# Patient Record
Sex: Male | Born: 1945 | Race: White | Hispanic: No | Smoking: Former smoker
Health system: Southern US, Community
[De-identification: ages and names within clinical notes are randomized; demographics above are authoritative.]

## PROBLEM LIST (undated history)

## (undated) DIAGNOSIS — I10 Essential (primary) hypertension: Secondary | ICD-10-CM

## (undated) DIAGNOSIS — Z7901 Long term (current) use of anticoagulants: Secondary | ICD-10-CM

## (undated) DIAGNOSIS — I25810 Atherosclerosis of coronary artery bypass graft(s) without angina pectoris: Secondary | ICD-10-CM

## (undated) DIAGNOSIS — H9190 Unspecified hearing loss, unspecified ear: Secondary | ICD-10-CM

## (undated) DIAGNOSIS — I4891 Unspecified atrial fibrillation: Secondary | ICD-10-CM

## (undated) DIAGNOSIS — C61 Malignant neoplasm of prostate: Secondary | ICD-10-CM

## (undated) DIAGNOSIS — N289 Disorder of kidney and ureter, unspecified: Secondary | ICD-10-CM

## (undated) DIAGNOSIS — R601 Generalized edema: Secondary | ICD-10-CM

## (undated) DIAGNOSIS — Z952 Presence of prosthetic heart valve: Secondary | ICD-10-CM

## (undated) HISTORY — DX: Long term (current) use of anticoagulants: Z79.01

## (undated) HISTORY — DX: Essential (primary) hypertension: I10

## (undated) HISTORY — DX: Atherosclerosis of coronary artery bypass graft(s) without angina pectoris: I25.810

## (undated) HISTORY — PX: CORONARY ARTERY BYPASS GRAFT: SHX141

## (undated) HISTORY — DX: Malignant neoplasm of prostate: C61

## (undated) HISTORY — DX: Presence of prosthetic heart valve: Z95.2

## (undated) HISTORY — DX: Unspecified atrial fibrillation: I48.91

---

## 1999-03-17 HISTORY — PX: MITRAL VALVE REPLACEMENT: SHX147

## 1999-07-23 ENCOUNTER — Inpatient Hospital Stay (HOSPITAL_COMMUNITY): Admission: EM | Admit: 1999-07-23 | Discharge: 1999-08-04 | Payer: Self-pay | Admitting: Emergency Medicine

## 1999-07-23 ENCOUNTER — Encounter (INDEPENDENT_AMBULATORY_CARE_PROVIDER_SITE_OTHER): Payer: Self-pay | Admitting: *Deleted

## 1999-07-29 ENCOUNTER — Encounter: Payer: Self-pay | Admitting: Thoracic Surgery (Cardiothoracic Vascular Surgery)

## 1999-07-30 ENCOUNTER — Encounter: Payer: Self-pay | Admitting: Thoracic Surgery (Cardiothoracic Vascular Surgery)

## 1999-07-31 ENCOUNTER — Encounter: Payer: Self-pay | Admitting: Thoracic Surgery (Cardiothoracic Vascular Surgery)

## 1999-08-01 ENCOUNTER — Encounter: Payer: Self-pay | Admitting: Thoracic Surgery (Cardiothoracic Vascular Surgery)

## 1999-08-18 ENCOUNTER — Inpatient Hospital Stay (HOSPITAL_COMMUNITY): Admission: EM | Admit: 1999-08-18 | Discharge: 1999-08-20 | Payer: Self-pay | Admitting: Emergency Medicine

## 1999-08-18 ENCOUNTER — Encounter: Payer: Self-pay | Admitting: Emergency Medicine

## 1999-08-20 ENCOUNTER — Encounter: Payer: Self-pay | Admitting: Interventional Cardiology

## 2006-11-22 ENCOUNTER — Encounter: Admission: RE | Admit: 2006-11-22 | Discharge: 2006-11-22 | Payer: Self-pay | Admitting: Gastroenterology

## 2007-01-18 ENCOUNTER — Ambulatory Visit (HOSPITAL_COMMUNITY): Admission: RE | Admit: 2007-01-18 | Discharge: 2007-01-18 | Payer: Self-pay | Admitting: Urology

## 2007-02-08 ENCOUNTER — Ambulatory Visit: Admission: RE | Admit: 2007-02-08 | Discharge: 2007-03-16 | Payer: Self-pay | Admitting: Urology

## 2007-03-17 ENCOUNTER — Ambulatory Visit: Admission: RE | Admit: 2007-03-17 | Discharge: 2007-06-15 | Payer: Self-pay | Admitting: Urology

## 2008-06-18 ENCOUNTER — Ambulatory Visit (HOSPITAL_COMMUNITY): Admission: RE | Admit: 2008-06-18 | Discharge: 2008-06-19 | Payer: Self-pay | Admitting: *Deleted

## 2010-06-25 LAB — PROTIME-INR
INR: 3.6 — ABNORMAL HIGH (ref 0.00–1.49)
INR: 4 — ABNORMAL HIGH (ref 0.00–1.49)
Prothrombin Time: 38.7 seconds — ABNORMAL HIGH (ref 11.6–15.2)
Prothrombin Time: 42.3 seconds — ABNORMAL HIGH (ref 11.6–15.2)

## 2010-06-25 LAB — CBC
HCT: 33.8 % — ABNORMAL LOW (ref 39.0–52.0)
Hemoglobin: 11.9 g/dL — ABNORMAL LOW (ref 13.0–17.0)
MCHC: 35 g/dL (ref 30.0–36.0)
MCV: 92.7 fL (ref 78.0–100.0)
Platelets: 244 10*3/uL (ref 150–400)
RBC: 3.65 MIL/uL — ABNORMAL LOW (ref 4.22–5.81)
RDW: 14.8 % (ref 11.5–15.5)
WBC: 4.6 10*3/uL (ref 4.0–10.5)

## 2010-06-25 LAB — BASIC METABOLIC PANEL
BUN: 15 mg/dL (ref 6–23)
CO2: 28 mEq/L (ref 19–32)
Calcium: 8.7 mg/dL (ref 8.4–10.5)
Chloride: 98 mEq/L (ref 96–112)
Creatinine, Ser: 1.25 mg/dL (ref 0.4–1.5)
GFR calc Af Amer: >60 mL/min (ref >60)
GFR calc non Af Amer: 58 mL/min — ABNORMAL LOW (ref >60)
Glucose, Bld: 150 mg/dL — ABNORMAL HIGH (ref 70–99)
Potassium: 4 mEq/L (ref 3.5–5.1)
Sodium: 136 mEq/L (ref 135–145)

## 2010-06-25 LAB — APTT: aPTT: 64 seconds — ABNORMAL HIGH (ref 24–37)

## 2010-07-29 NOTE — Op Note (Signed)
Mario Powell, Mario Powell                 ACCOUNT NO.:  [id number]   MEDICAL RECORD NO.:  [id number]          PATIENT TYPE:  AMB   LOCATION:  SDS                          FACILITY:  [facility]   PHYSICIAN:  Mark C. Lorin Mercy, M.D.    DATE OF BIRTH:  04/10/1945   DATE OF PROCEDURE:  06/18/2008  DATE OF DISCHARGE:                               OPERATIVE REPORT   PREOPERATIVE DIAGNOSIS:  Left distal radius fracture, displaced  angulated.   POSTOPERATIVE DIAGNOSIS:  Left distal radius fracture, displaced  angulated.   PROCEDURE:  Open reduction and internal fixation, left distal radius,  three-part intra-articular fracture.   SURGEON:  Mark C. Lorin Mercy, MD   ANESTHESIA:  General MLA plus 10 mL Marcaine and local.   TOURNIQUET TIME:  43 minutes.   PROCEDURE IN DETAIL:  After induction of general anesthesia and MLA tube  placement, standard prepping and draping with proximal arm tourniquet,  removal of splint, DuraPrep, extremity sheets, drapes, stockinette, and  surgical time-out checklist completed with 2 grams Ancef given.  The  patient had Ancef given prophylactically.  Arm was wrapped with  tourniquet and tourniquet inflated.  Sterile skin marker was used on the  skin and incision was made on the volar wrist.  FCR fascia was split and  then pulling the tendon toward the radial aspect and protect the radial  artery of the posterior FCR wrist sheath was split.  Pronator was peeled  off the radial aspect of the radius exposing the fracture site which was  100% displaced.  Fracture was reduced, standard plate was selected,  DePuy DVR plate.  With fracture reduced, K-wire was placed through the  styloid for temporary stabilization of the plate was applied.  Hole was  drilled in the slotted screw 14-mm length and then plate adjusted  appropriately to good position.  It was difficult to maintain the  reduction.  The reduction had been reperformed a few times and with  traction held, the distal holes  were drilled with distal row going from  ulnar aspect progressing the radial styloid.  A 10 and 20-mm smooth pegs  were used.  A threaded screw in the styloid, then proximal rows were  filled with 18-mm pegs, all locked in securely.  Second 14-mm screw was  placed in the most proximal aspect of the plate.  The wound was  irrigated, checked under fluoroscopy, AP and lateral and photos taken.  There was reduction of the scaphoid and lunate portions of the distal  radius.  Correction of the tilt to the distal radius and correction of  the displacement.  The tourniquet was deflated.  Hemostasis was  obtained.  Radial artery was normal.  Subcutaneous vein had been  coagulated away and was re-coagulated with bipolar cautery.  A 2-0  Vicryl subcutaneous tissue and 4-0 Vicryl subcuticular closure, tincture  of benzoin, Steri-Strips, 4 x 4s, Marcaine infiltration in the skin, and  dorsal splint 10-  thick, after Webril and __________ were applied.  The patient tolerated  the procedure well and was transferred to the recovery room where he  will stay overnight for IV pain medication since he has had difficulty  ambulating and then using crutches prior to his fall.      Mark C. Lorin Mercy, M.D.  Electronically Signed     MCY/MEDQ  D:  06/18/2008  T:  06/19/2008  Job:  [id number]

## 2010-08-01 NOTE — Discharge Summary (Signed)
[place]. [facility]  Patient:    Mario Powell, Mario Powell                          MRN: [id number] Adm. Date:  [id number] Disc. Date: [id number] Attending:  Belva Crome. Iii Dictator:   Julianne Handler, N.P. CC:         Emeline General. Dema Severin, M.D.             Valentina Gu. Roxy Powell, M.D.                           Discharge Summary  PRIMARY CARE Shelaine Frie:  Emeline General. Dema Severin, M.D.  CARDIOVASCULAR THORACIC SURGEON:  Valentina Gu. Roxy Powell, M.D.  PROCEDURES:  Unclotting of PICC line with urokinase on August 20, 1999.  DISCHARGE DIAGNOSES: 1. Hypotension related to atrial fibrillation with rapid ventricular response    exacerbated by fluid volume depletion.  Improvement of admission blood    pressure of approximately 80 systolic to [id number] systolic with conversion of    atrial fibrillation to normal sinus rhythm, control of ventricular rate,    and fluid volume repletion. 2. Atrial fibrillation.  Converted to and maintained normal sinus rhythm with    initiation of Betapace therapy. 3. History of staphylococcal mitral valve endocarditis; status post mitral    valve replacement and coronary artery bypass graft surgery x 1 on Jul 29, 1999 (Mario Powell, M.D.).  He remains on Ancef 2 g IV q.8h. via home    health.  A PICC line is in place, though observed to be clotted this    admission; anticipate declotting of that PICC by radiology/urokinase    protocol before discharge.  He was continued on antibiotics as prior to    admission during this hospital course. 4. Status post mitral valve replacement with St. Jude prosthesis, status post    coronary artery bypass graft surgery x 1 on Jul 29, 1999. 5. Systemic anticoagulation with Coumadin secondary to mitral valve    replacement.  On Coumadin 1 mg per day.  DISPOSITION:  The patient was discharged home in improved condition.  DISCHARGE MEDICATIONS: 1. Betapace 80 mg one p.o. b.i.d. (new). 2. Ancef 2 g per PICC line every eight hours as  prior to admission. 3. Lasix 80 mg one half tablet a day (decreased dosage). 4. K-Dur 20 mEq once daily. 5. Altace 2.5 mg once daily. 6. Enteric-coated aspirin 81 mg once daily. 7. Digoxin 0.125 mg p.o. q.d. (new).  ACTIVITY:  As tolerated.  DIET:  Regular.  SPECIAL INSTRUCTIONS:  Call for fevers.  Resume home health IV antibiotics.  FOLLOW-UP:  Farrel Gordon, M.D., on Wednesday, September 03, 1999, at 11:15 a.m.  The patient will have follow-up pro time/INR via our Coumadin clinic.  HISTORY OF PRESENT ILLNESS:  (Please also see the dictated H&P.)  Mr. Yambao is a 65 year old male who was discharged from [facility] on Aug 04, 1999, after undergoing mitral valve replacement and coronary artery bypass graft surgery related to the development of acute staphylococcal mitral valve endocarditis.  At that hospital admission, he had initially presented with a supraventricular tachycardia, was in congestive heart failure, and subsequently required electrical cardioversion.  At conversion, it was found that he had a loud systolic murmur and fever along with liver enzyme elevations.  Subsequent work-up had identified endocarditis which was successfully treated with  antibiotics and surgery as outlined above.  The patient remained on Ancef 2 g IV q.8h. on home health.  Starting two days prior to admission, he began feeling weak and lightheaded when he would stand. He had no sensation of palpitations.  The home health nurse upon evaluation on the day of admission noted that his heart rate was above 120 and his blood pressure was 80 systolic.  She sent him to the emergency room.  In the ER, he denied shortness of breath and was without chest discomfort, but was feeling weak and somewhat pale.  HOSPITAL COURSE:  His EKG revealed atrial fibrillation with a rapid ventricular response without acute ST-T wave changes.  The BUN was elevated at 34 with a creatinine of 1.4.  The  patient was given IV fluids for suspected fluid volume depletion, as well as withholding of his diuretics and ACE inhibitor.  He was given a 15 mg IV Cardizem bolus with drip, as well as loaded with digoxin for rate control.  He was initiated on Betapace therapy and subsequently converted to NSR after the first dose.  On August 20, 1999, he was stable, though it was noted that PICC line was clotted.  He was scheduled to undergo urokinase protocol for unclotting PICC line by the radiology department and was discharged home after that.  Will also obtain a PA and lateral chest x-ray as routine follow-up post mitral valve replacement surgery.  LABORATORY DATA:  The CBC revealed a WBC of 5.3, a hemoglobin of 9.7, a hematocrit of 28.2, and platelets of 326.  Admission pro time of 20.5 with an INR of 2.4 and a PTT of 37.  Admission sodium 127 (improved to 130 on August 19, 1999), K 4.6, chloride 97, and CO2 22.  The glucose ranged from 124-144. BUN 34, creatinine 1.4.  Subsequent BUN of 27 with a creatinine of 1.2 post IV fluid hydration and withholding of ACE and diuretic.  Calcium 85, total protein 7.4, albumin 2.8.  LFTs overall within normal range, except for increase in ALP of 182.  CK 23 with MB fraction less than 0.3.  Troponin less than 0.03.  The initial EKG revealed atrial fibrillation with rapid ventricular response and without acute ST wave changes. DD:  08/20/99 TD:  08/23/99 Job: [id number]

## 2010-08-01 NOTE — Procedures (Signed)
[place]. [facility]  Patient:    Mario Powell, Mario Powell                          MRN: [id number] Proc. Date: 07/29/99 Adm. Date:  [id number] Disc. Date: [id number] Attending:  Darylene Price CC:         Valentina Gu. Roxy Manns, M.D.                           Procedure Report  PROCEDURE PERFORMED:  Transesophageal echocardiographic evaluation  and monitoring during mitral valve replacement.  ANESTHESIOLOGIST:  Crissie Sickles. Conrad , M.D.  INDICATIONS FOR PROCEDURE:  Mario Powell is a 65 year old gentleman who presented with known mitral regurgitation, history of congestive heart failure with bacterial endocarditis.  He was brought to the operating room and after line placement and uneventful induction of general anesthesia with endotracheal intubation, the transesophageal echocardiogram probe was easily passed into his stomach.  Initial short axis view of the left ventricle showed left ventricular hypertrophy but good wall motion abnormality in all segments. Turning to the four-chamber view, looking specifically at the mitral valve, the posterior leaflet was seen to have a vegetation prominently on the echo and on placing color flow Doppler across the valve, 3 to 4+ mitral regurgitation even in the unloaded state was seen.  The structure of the valve looked reasonably normal aside from the vegetation on the tip of the posterior leaflet.  The majority of the regurgitant jet seemed to track up along the septal wall of the left atrium.  The left atrium was enlarged.  Flow in the pulmonary veins was easily determined and was noted to be reversed. Interatrial septum was normal.  Aortic valve was normal on both views.  The right side of the heart was within normal limits.  The patient was then placed on cardiopulmonary bypass by Dr. Roxy Manns and underwent mitral valve replacement.  Towards the end of the bypass run, initial views of the left atrium showed some moderate amount of air for  which Dr. Roxy Manns performed maneuvers to extract the intracardiac air.  The patient then discontinued from cardiopulmonary bypass with minimal inotropes in good condition.  Initial view of the left ventricle shows unchanged, left ventricular hypertrophy with good wall motion abnormality.  In the region of the mitral valve, the prosthetic valve could be seen to be seated.  The leaflet seemed to be working well and there were no perivalvular leaks other than the normal angle jets ____________ post valve replacement.  The rest of the heart was unchanged from the preop exam.  Flow now in the pulmonary veins was forward.  The patients chest was closed and he was taken to the intensive care unit in stable condition. DD:  07/29/99 TD:  08/01/99 Job: [id number]

## 2010-08-01 NOTE — Op Note (Signed)
[place]. [facility]  Patient:    Mario Powell, Mario Powell                          MRN: [id number] Proc. Date: 07/28/99 Adm. Date:  [id number] Disc. Date: [id number] Attending:  Doyce Para                           Operative Report  REASON FOR STUDY:  Evaluation of endocarditis.  Evaluation of mitral valve preoperatively.  PROCEDURE:  Transesophageal echocardiogram.  DESCRIPTION OF PROCEDURE:  After obtaining written informed consent, the patient was brought to the endoscopy lab in the postabsorptive state.  Topical anesthesia was achieved using Cetacaine spray and viscous lidocaine.  IV sedation was achieved using IV Versed and IV fentanyl  Following appropriate sedation, the Omnipen probe was introduced using digital guidance without difficulty.  Multiple views were obtained at the mid esophageal phase on deep gastric view.  Flow Doppler was performed across the mitral, aortic, tricuspid and pulmonary valves.  Beveled study was performed because of intraatrial septum.  The ascending, descending and aortic arch was then visualized.  The Omnipen probe was then removed; there were no immediate complications.  FINDINGS:  There was severe prolapse of the posterior mitral valve leaflet. A small strand consistent with a vegetation was noted at the leaflet tip. There was severe mitral regurgitation with reversal of flow in the pulmonary vein.  The subvalvular apparatus appeared intact.  There was hypercontractility of the left ventricle, with an ejection fraction of 75%. The left atrium was mildly dilated.  The aortic, tricuspid and pulmonary valves were normal morphology.  There was no vegetation noted on these valves.  There was no tricuspid regurgitation.  The left atrial appendage was normal. Negative bubble study.  No pericardial effusion was noted.  The ascending, descending and aortic arch were within normal limits.  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

## 2010-08-01 NOTE — H&P (Signed)
[place]. [facility]  Patient:    Mario Powell, Mario Powell                          MRN: [id number] Adm. Date:  [id number] Disc. Date: [id number] Attending:  Darylene Price CC:         Valentina Gu. Roxy Manns, M.D.             Emeline General Dema Severin, M.D.                         History and Physical  REASON FOR ADMISSION:  Weakness.  HISTORY OF PRESENT ILLNESS:  The patient is 65 and was discharged from [facility] on Aug 04, 1999, after undergoing mitral valve replacement and coronary artery bypass surgery related to the development of acute staphylococcal mitral valve endocarditis. He initially presented with a supraventricular tachycardia, was in congestive heart failure, and subsequently required electrical conversion. After conversion, it was found that he had a loud systolic murmur and fever along with liver enzyme elevations. The subsequent workup identified endocarditis which was successfully treated with antibiotics and surgery as outlined above. He remains on Ancef 2 g IV q.8h. on home health. Starting two days ago, he began feeling weak and lightheaded when he would stand. He had no sensation of palpitations. The home health nurse came out today and noted that his heart rate was above 120 and that his blood pressure was 80. She sent him to the emergency room. In the emergency room, he denied shortness of breath and is having no chest discomfort but is feeling weak and is somewhat pale.  CURRENT MEDICATIONS: 1. Coumadin 1 mg per day. 2. Ancef 2 g IV q.8h. 3. K-Dur 20 mEq per day. 4. Coated aspirin 81 mg per day. 5. Lasix 80 mg p.o. b.i.d. 6. Altace 2.5 mg per day.  ALLERGIES:  PENICILLIN.  SOCIAL HISTORY/HABITS:  Does not smoke. He denies ethanol consumption.  FAMILY HISTORY:  Father died of myocardial infarction at age 79.  SIGNIFICANT MEDICAL PROBLEMS: 1. Acute staphylococcal bacterial endocarditis Jul 23, 1999, as outlined above. 2. History of gout. 3.  Status post hydrocele repair.  REVIEW OF SYSTEMS:  Unremarkable.  PHYSICAL EXAMINATION:  GENERAL:  The patients color is relatively good.  VITAL SIGNS:  His blood pressure is 88/60, heart rate is 120 to 145 in atrial fibrillation with an irregularly irregular rhythm, his temperature is 98.7.  SKIN:  Reveals no cyanosis. There is mild pallor.  HEENT:  Reveals pupils that are equal and reactive to light.  NECK:  Reveals no JVD, carotid bruits, or thyromegaly.  CHEST:  Reveals a healing sternal incision. No rales are heard. No wheezing is heard.  CARDIAC:  Unremarkable. No murmur or rub is heard. Mechanical valve closure sounds are heard.  ABDOMEN:  Soft. Liver and spleen are not palpable. Bowel sounds are normal.  EXTREMITIES:  Reveal no edema. Pulses are 2+ and symmetric. Right lower extremity reveals a healing midline right lower extremity incision from vein graft harvesting.  NEUROLOGICAL:  Normal.  LABORATORY DATA:  EKG reveals atrial fibrillation with rapid ventricular response and no acute ST-T wave change.  Chest x-ray is pending.  BUN is 34, creatinine 1.4. White blood cell count is 5.3000, hemoglobin 9.7, hematocrit 28.2, platelet count 326,000.  ASSESSMENT: 1. Atrial fibrillation with rapid ventricular response and hypotension likely    related to relative intravascular volume contraction. 2. Status post  mitral valve endocarditis, status post coronary artery bypass    surgery and mitral valve replacement with St. Jude prosthesis on Jul 29, 1999. 3. History of supraventricular tachycardia prior to surgery.  RECOMMENDATION: 1. IV fluids. 2. Cardizem to slow rate. 3. Digoxin. 4. Admit for management of arrhythmia.DD:  08/18/99 TD:  08/18/99 Job: [id number]

## 2010-08-01 NOTE — Consult Note (Signed)
[place]. [facility]  Patient:    Mario Powell, Mario Powell                          MRN: [id number] Proc. Date: 07/23/99 Attending:  Illene Labrador, M.D. CC:         Audree Camel. Hurman Horn., M.D.             Doyce Para, M.D.                          Consultation Report  CONCLUSIONS: 1. Atrial flutter with 2:1 arteriovenous conduction and an effective    ventricular rate of 185 beats per minute.    a. Failure of intravenous Cardizem and IV bolus doses of adenosine to       significantly slow the arrhythmia.    b. Duration of atrial arrhythmia is unknown. 2. Pulmonary congestion of uncertain etiology, probably secondary to prolonged    atrial arrhythmia.  A quick-look echocardiogram in the emergency room also    revealed mitral valve prolapse with mitral regurgitation, although it is    difficult to interpret the significance of this finding at the patients    rapid heart rate.  The ejection fraction appeared to be greater than 50%. 3. Jaundice and fever, etiology uncertain.  It is possible that the jaundice    may be related to hepatic congestion from prolonged atrial arrhythmia, or    there may be some structural or inflammatory hepatic or biliary process.  RECOMMENDATIONS: 1. IV Cardizem. 2. IV procainamide. 3. Limit IV fluids. 4. A 2-D echocardiogram, quick look already done with formal echo to follow    conversion of the rhythm to normal rhythm. 5. Workup jaundice and fever. 6. Digitalization.  COMMENTS:  The patient is 65 years of age and has no history of heart disease. He was admitted with jaundice, tachycardia, hypotension, and fever.  He has had shortness of breath for the past several days.  He has difficulty lying flat.  Perhaps this morning he felt some palpitations.  He has not had syncope.  He has noticed yellow eyes for the past 48 hours.  His urine has been dark.  HABITS:  He does not smoke.  He does drink alcohol, but it is difficult to tell how  heavily.  ALLERGIES:  He denies allergies.  MEDICATIONS:  He is on no medications.  PHYSICAL EXAMINATION:  VITAL SIGNS:  Blood pressure 110/70, heart rate 190 after IV bolus Cardizem, total of 40 mg, and 10 mg per minute drip.  He has also received a total of 18 mg of IV Adenocard.  SKIN:  Dry and jaundiced.  HEENT:  Scleral icterus.  NECK:  Reveals dilated internal jugulars to the level of the jaw with patient lying at 45 degrees.  LUNGS:  Basilar congestion, no wheezing.  CARDIAC:  Systolic murmur that is grade 2/6.  No obvious diastolic murmurs are heard.  No rub is heard.  ABDOMEN:  Soft.  Liver and spleen are not palpable.  EXTREMITIES:  Reveal 2+ edema.  NEUROLOGIC:  Grossly intact.  LABORATORY DATA:  EKG reveals supraventricular tachycardia at a rate of 186j, and on further inspection, atrial flutter with a relatively short flutter cycle length.  No acute ST-T wave changes are noted.  Chest x-ray reveals cardiomegaly with pulmonary congestion.  Quick-look 2-D echocardiogram reveals relatively hyperdynamic LV function with an EF in excess of 50%,  mitral regurgitation, mitral valve prolapse, no obvious vegetation.  Right heart appears normal in size, and there was no pericardial effusion. DD:  07/23/99 TD:  07/23/99 Job: [id number]

## 2010-08-01 NOTE — Procedures (Signed)
[place]. [facility]  Patient:    Mario Powell, Mario Powell                          MRN: [id number] Proc. Date: 07/23/99 Adm. Date:  [id number] Disc. Date: [id number] Attending:  Doyce Para CC:         Doyce Para, M.D.             Robert A. Hurman Horn., M.D.             Finis Bud, M.D.                           Procedure Report  INDICATION:  Atrial flutter with rapid ventricular response.  There has been no response to multiple intravenous medications (IV procainamide, IV Cardizem, IV Adenocard).  PROCEDURE:  Electrical cardioversion.  DESCRIPTION OF PROCEDURE:  After informed consent, the patient was brought to the acute major side of the emergency room where he was given anesthesia by Dr. Finis Bud.  He received 50 mg of IV Propofol.  This induced the desired anesthetic effect.  In a synchronized mode 100 joules was discharged and it converted the patient to normal sinus rhythm/sinus tachycardia.  The patient awakened from the procedure without evidence of neurological sequela. ECG revealed normal sinus rhythm without evidence of acute injury.  CONCLUSIONS:  Successful emergency conversion from atrial flutter to normal sinus rhythm/sinus tachycardia.  PLAN: 1. Discontinue intravenous Cardizem. 2. Continue intravenous procainamide digitalization. 3. Two-dimensional echocardiogram. 4. Decrease intravenous fluids. DD:  07/23/99 TD:  07/25/99 Job: [id number]

## 2010-08-01 NOTE — Cardiovascular Report (Signed)
[place]  Patient:    Mario Powell, Mario Powell                          MRN: [id number] Proc. Date: 07/25/99 Adm. Date:  [id number] Disc. Date: [id number] Attending:  Doyce Para CC:         Valentina Gu. Roxy Manns, M.D.             Court Joy, M.D.             [facility]                        Cardiac Catheterization  CINE NO. 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  PROCEDURES PERFORMED: 1. Left heart catheterization. 2. Right heart catheterization. 3. Selective coronary angiography.  DESCRIPTION OF PROCEDURE:  After informed consent, an 8-French sheath was started in the right femoral vein and a 6-French sheath in the right femoral artery.  A 7-French Swan-Ganz catheter was used for right heart hemodynamic recordings.  The 6-French left #4 Judkins catheters were used for left and right coronary angiography.  The patient tolerated the procedure without complications.  A total of 100 cc of contrast was administered. Ventriculography was not performed.  RESULTS:   I. Hemodynamic data      A. Aortic pressure:  101/78.      B. Left ventricular pressure:  Not recorded.      C. Right atrial mean pressure:  19.      D. Right ventricular pressure:  82/24.      E. Pulmonary artery pressure:  181/42.      F. Pulmonary capillary wedge pressure:  33, V wave 240 mmHg.  II. Selective coronary angiography      A. Left main coronary:  Widely patent.      B. Left anterior descending coronary:  Luminal irregularities with a         large diagonal branch.  The LAD wraps the apex.  No significant         obstruction is noted in the branches or the main body of the LAD.      C. Circumflex artery:  The circumflex gives origin to three obtuse         marginal branches.  The smallest of the obtuse marginal branches,         the second obtuse marginal, contains a 60-70% ostial  stenosis.  The         circumflex is otherwise free of significant obstruction.      D. Right coronary:  This is a dominant vessel giving a PDA and a left         ventricular branch.  There is a 70-80% proximal RCA stenosis.  CONCLUSIONS: 1. Significant coronary disease involving the right coronary and second    obtuse marginal. 2. Pulmonary hypertension, severe.  RECOMMENDATIONS:  Per CVTS.  The patient will need mitral valve replacement. DD:  07/25/99 TD:  07/27/99 Job: [id number]

## 2010-08-01 NOTE — Consult Note (Signed)
[place]. [facility]  Patient:    USMAN, DUNMORE                          MRN: [id number] Proc. Date: 07/24/99 Adm. Date:  [id number] Disc. Date: [id number] Attending:  Doyce Para CC:         Farrel Gordon, M.D.             Doyce Para, M.D.             Robert A. Hurman Horn., M.D.             Emeline General Dema Severin, M.D.             Arelia Longest. Michelle Nasuti., M.D.                          Consultation Report  REFERRING PHYSICIAN:  Farrel Gordon, M.D.  PRIMARY PHYSICIANS: 1. Doyce Para, M.D. [place]. Hurman Horn., M.D. 3. Emeline General. Dema Severin, M.D.  REASON FOR CONSULTATION:  Endocarditis.  HISTORY OF PRESENT ILLNESS:  Mr. 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  He is retired from the [employer].  He has no previous cardiac history and specifically denies any known history of previous heart murmur, episode of congestive heart failure, myocardial infarction, rheumatic fever.  He was in his usual state of good health until four days previously when he developed sudden onset of febrile illness associated with severe malaise and flulike syndrome.  He reports having a fever as high as 104.0 degrees.  He developed soreness in his back, abdomen, arms, and legs.  He became very weak and short of breath.  He developed anorexia and some nausea without any associated vomiting, constipation, or diarrhea.  He reports no abdominal pain, chest pain, or headache.  He has developed arthralgia involving his left hand, wrist, and back.  He reports no cough, hemoptysis, hematochezia, and hematemesis.  He ultimately presented for evaluation to his primary care physician and was subsequently referred to the hospital for admission.  He was noted to be in rapid atrial flutter and cardiology consultation was obtained with Dr. Illene Labrador III.  He was noted to have a  murmur on physical exam suggestive of at least moderate to severe mitral regurgitation.  He was noted to have pulmonary edema as well as jaundice with an admission serum bilirubin of 4.8 despite normal serum transaminase levels and alkaline phosphatase.  Blood cultures were obtained on admission and within 24 hours were positive for bacterial growth consisting of gram-positive cocci in clusters.  Infectious disease and cardiovascular surgical consultations were obtained.  REVIEW OF SYSTEMS:  Mr. Karney reports that prior to last Sunday he has been feeling quite well and in his usual state of health.  He denies any proceeding symptoms of exertional shortness of breath, PND, orthopnea, palpitations, syncope, near syncopal episodes, chest pain, or lower extremity edema.  He reports no recent change in weight, appetite, or bowel function.  He has had no recent other illnesses or medical problems.  He has had a fair amount of dental work done over the last year including a temporary crown placement within the last three to four weeks prior to admission.  He denies any current tender or loose teeth and he reports no areas  that are acutely bothersome in his mouth.  The remainder of his review of systems is unremarkable.  PAST MEDICAL HISTORY:  Notable only for history of gout for which he takes probenecid on a daily basis.  He denies any history of hypertension, hypercholesterolemia, stroke, myocardial infarction, congestive heart failure, rheumatic fever, heart murmur, peripheral vascular disease.  SOCIAL HISTORY:  The patient is retired from [employer] and works full-time as previously discussed.  He lives with his wife and two children here in [place].  His habits include modest alcohol consumption.  He has drunk more significant amounts of alcohol in the past, although he denies any heavy alcohol use in the last several years.  He has a previous history of heavy tobacco use, although he  quit smoking altogether in 1986.  FAMILY HISTORY:  Notable for a strong family history of coronary artery disease.  His father died of an acute myocardial infarction at age 79.  He has several uncles who have died of coronary disease at an early age.  There is no history of valvular heart disease to his knowledge.  MEDICATIONS:  Medications prior to admission include only probenecid 500 mg p.o. b.i.d. for gout.  ALLERGIES:  Mr. Lantis reports a drug allergy to PENICILLIN as he was noted to have some sort of reaction to PENICILLIN at age 50.  He denies any other known drug allergies or sensitivities.  PHYSICAL EXAMINATION:  GENERAL:  A well-developed, well-nourished white male who is mildly ill-appearing, although resting comfortably and breathing comfortably.  VITAL SIGNS:  By two-channel monitor, he has sinus tachycardia with heart rate of 105.  HEENT:  Notable for the absence of any obvious gingival abscess or loose teeth.  NECK:  Supple.  There are no carotid bruits identified.  LYMPH NODES:  There are no cervical or supraclavicular lymphadenopathies.  CHEST:  Auscultation of the chest demonstrates clear lung fields bilaterally with the exception of a few basilar crackles on deep inspiration.  NEUROLOGICAL:  The patient is very weak and sore and has difficulty even just sitting up in bed due to his generalized severe weakness and malaise.  CARDIOVASCULAR:  Somewhat tachycardic heart rate but regular rhythm.  There is a loud grade 4/6 holosystolic murmur heard best near the apex with radiation to the axilla and across the precordium.  No diastolic murmur is appreciated.  ABDOMEN:  The abdomen is soft, nondistended, and nontender.  The liver edge is not palpable.  EXTREMITIES:  Warm and well perfused.  There is trace bipedal edema.  There are skin lesions noted on all four extremities suggestive of infectious endocarditis.   GENITOURINARY:  Deferred.  RECTAL:   Deferred.  LABORATORY DATA:  Laboratory data is notable for admission comprehensive metabolic panel with serum sodium 129, potassium 4.0, chloride 95, bicarb 23, BUN 40, creatinine 1.9, and a glucose of 163.  His serum albumin is low at 3.0 with an AST of 48, ALT 50, alkaline phosphatase 91, and a total bilirubin 4.8. Complete blood count demonstrates white blood count of 6000.  Hemoglobin 13.5, hematocrit 37.5% with a platelet count of 120,000.  Prothrombin time is 14.9 with an INR of 1.3 and a PTT of 35.  Cardiac enzymes are negative.  Serum ammonia level is normal at 31.  Serum amylase and lipase are low normal. Arterial blood gas demonstrates pH 7.46 with a pCO2 of 29, pO2 66, and a bicarb of 21.  Portable chest x-ray demonstrates cardiomegaly with pulmonary vascular congestion.  A 12-lead  electrocardiogram performed Jul 23, 1999 demonstrates supraventricular tachycardia.  Mr. Yeagley subsequently underwent cardioversion by Dr. Illene Labrador III on Jul 23, 1999 and has remained in sinus rhythm since that time.  Transthoracic echocardiogram performed this afternoon on preliminary results demonstrates severe mitral regurgitation with what appears to be a large vegetation emanating from the posterior leaflet of the mitral valve.  There are no obvious signs of aortic valve involvement such as aortic insufficiency. Left ventricular functions appears preserved and the left ventricle is not terribly enlarged.  Formal evaluation and results of this test remain pending at this time.  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  This is complicated by acute renal insufficiency, mild jaundice, mild thrombocytopenia.  I believe that Mr. Molyneaux will likely need surgical intervention for  treatment of his bacterial endocarditis.  PLAN:  I recommend proceeding with left and right heart catheterization as well as transesophageal echocardiogram as soon as these can be practically performed.  Mr. Lahaye additionally needs dental service evaluation and orthopanogram.  I agree with the recommendations for intravenous antibiotics as per Dr. Arelia Longest. Michelle Nasuti.  We will continue to follow closely and tentatively presume the likely need for proceeding with surgical intervention early next week if not sooner. DD:  07/24/99 TD:  07/24/99 Job: [id number]

## 2010-08-01 NOTE — Op Note (Signed)
[place]. [facility]  Patient:    NADER, IPPOLITO                          MRN: [id number] Proc. Date: 07/29/99 Adm. Date:  [id number] Disc. Date: [id number] Attending:  Darylene Price CC:         Illene Labrador, M.D.             Doyce Para, M.D.             Robert A. Hurman Horn., M.D.             Emeline General Dema Severin, M.D.             [facility]                           Operative Report  PREOPERATIVE DIAGNOSIS:  Bacterial endocarditis with severe mitral regurgitation, class 4 congestive heart failure, one vessel coronary artery disease.  POSTOPERATIVE DIAGNOSIS:  Bacterial endocarditis with severe mitral regurgitation, class 4 congestive heart failure, one vessel coronary artery disease.  OPERATION PERFORMED:  Median sternotomy for mitral valve replacement (#31 mm St. Jude mechanical valve) and coronary artery bypass grafting x 1 (saphenous vein graft to distal right coronary artery).  SURGEON:  Valentina Gu. Roxy Manns, M.D.  ASSISTANT:  Shelle Iron, P.A.  ANESTHESIA:  General.  INDICATIONS FOR PROCEDURE:  The patient is a 65 year old white male with no previous cardiac history who developed acute flu-like syndrome and febrile illness and rapidly developed symptoms of class 4 congestive heart failure. He presented to the emergency room and was noted to be in rapid atrial flutter with heart rate in the 180s.  He ultimately underwent DC cardioversion and blood cultures obtained at that time grew out Staphylococcus aureus which was a sensitive species to all penicillin containing products.  Echocardiogram including both transthoracic and transesophageal echocardiogram demonstrated severe 4+ mitral regurgitation with preserved left ventricular function. There is severe prolapse of the posterior leaflet and mitral valve with what appears to be vegatations involving the mitral valve.  There is no obvious sign of any endocarditis involving the aortic valve,  tricuspid valve.  No other specific abnormalities were identified.  Cardiac catheterization demonstrates one vessel coronary artery disease.  Right heart catheterization demonstrates severe pulmonary hypertension with PA pressures in the 80s with preserved antegrade cardiac output.  OPERATIVE CONSENT:  The patient was counseled at length regarding the indications and potential benefits of mitral valve repair or replacement with coronary artery bypass grafting.  He accepts the risks of surgery including but not limited to the risks of death, stroke, myocardial infarction, congestive heart failure, bleeding requiring blood transfusion, arrhythmia, heart block requiring permanent pacemaker, recurrent bacterial endocarditis, prosthetic valve disease.  He understands the likelihood that prosthetic valve replacement will be necessary although a possible consideration of valve repair will be entertained at the time of surgery.  He accepts accepts these risks as well as any unforeseen complications and agrees to proceed with the surgery as described.  DESCRIPTION OF PROCEDURE:  The patient was brought to the operating room on the above-mentioned date and invasive hemodynamic monitoring was established by the anesthesia service under the care and direction of Dr. Lillia Abed. Prior to induction of anesthesia, the patients pulmonary artery pressures were noted to be between 80 and 90 mmHg systolic whereas his arterial blood pressure was between 90 and 100.  General  endotracheal anesthesia was induced uneventfully.  Transesophageal echocardiogram was performed by Dr. Conrad Hayden. This confirmed the findings as discussed previously.  There remains no sign of endocarditis involving the aortic valve or tricuspid valve.  There is no aortic insufficiency.  Left ventricular function appears relatively preserved. There is mild dilatation of the left ventricle and moderate dilatation of the left atrium.  The  patients chest, abdomen, both groins and both lower extremities were prepped and draped in a sterile manner.  A median sternotomy incision was performed.  A portion of the saphenous vein was obtained from the patients right lower leg through a series of longitudinal incisions.  The saphenous vein was notably good quality conduit for bypass grafting.  The patient was heparinized systemically.  The pericardium was opened.  The ascending aorta was inspected and was notably free of any palpable plaques or calcifications.  The ascending aorta was cannulated for cardiopulmonary bypass.  Dual venous cannulation was obtained, placing a right angle metal tipped cannula directly into the superior vena cava and a second cannula placed low in the right atrium down into the inferior vena cava.  A retrograde cardioplegia catheter was placed through the right atrium into the coronary sinus.  Adequate heparinization was verified.  Cardiopulmonary bypass was begun and the surface of the heart was inspected. There was mild left ventricular dilatation and mild to moderate left ventricular hypertrophy.  The distal right coronary artery appears to be normal.  The interatrial groove was dissected.  Umbilical tapes were placed around both the superior and inferior vena cava.  Cardioplegia catheter was placed in the ascending aorta.  A temperature probe was placed in the left ventricular septum.  A styrofoam pad was placed to protect the left phrenic nerve from thermal injury.  The patient was cooled to 28 degrees systemic temperature.  The aortic crossclamp was applied and cardioplegia was delivered initially in antegrade fashion through the aortic root.  Additional doses of cardioplegia were administered retrograde through the coronary sinus catheter as well as antegrade through the subsequently placed vein graft to maintain septal temperature below 15 degrees centigrade throughout the crossclamp portion  of  the operation. Iced saline slush was applied for topical hypothermia.  The following distal coronary anastomosis was performed:  (1) The distal right coronary artery was grafted with a saphenous vein graft in end-to-side fashion using running 7-0 Prolene suture.  This coronary measured 2.0 mm in diameter and was of good quality at the site of distal bypass.  The left atriotomy was performed through the interatrial groove posteriorly. The mitral valve was exposed using a self-retaining retractor.  Towels were placed circumferentially around the entire operative field to maintain sterility.  The existing mitral valve was carefully inspected.  There was evidence of chronic mitral valve prolapse with severe prolapse involving the majority of the entire posterior leaflet of the mitral valve.  There was an area of prolapse involving the anterior leaflet of the mitral valve near the anterolateral commissure. There were several flailed chordae which insert into the head of the posterior medial papillary muscle.  There is evidence of acute bacterial endocarditis with vegetation which appears to have eaten through the head of the posterior medial papillary muscle.  Portions of this vegetation were debrided and a portion was sent for Gram stain, culture and sensitivity. There were additional several small areas of vegetations noted along the edges of both the posterior leaflet and the anterior leaflet of the mitral valve. All obviously infected tissue was  debrided sharply.  After completion of the debridement, the majority of the subvalvular apparatus had been disrupted. The remainder of the mitral valve was excised sharply.  After completion of the debridement, the mitral valve annulus, the left ventricular cavity and left atrium were irrigated with cold saline solution containing vancomycin. The mitral annulus was painted with Betadine solution.  All instruments were used for the debridement  were discarded and the operative field was redraped with sterile towels.  Subsequently, the left ventricle and left atrium were irrigated with copious iced saline solution while maintaining retrograde cardioplegia catheter cardioplegia through the retrograde coronary sinus catheter.  The mitral annulus was sized to a 31 mm St. Jude mechanical valve.  Mechanical valve replacement was performed using interrupted horizontal mattress 2-0 Ethibond pledgeted sutures with pledgets in the superannular position.  A 31 mm St. Jude mechanical valve was secured in place uneventfully.  After all of the sutures had been secured, mitral valve leaflets were inspected for appropriate movement of the mechanical valve.  A 12 French Foley catheter was placed across the valve to maintain its incompetence temporarily and the Foley catheter was fixed to a left ventricular vent to maintain drainage.  Rewarming was begun.  The left atriotomy was closed using running two-layer closure of 3-0 Prolene suture.  Immediately prior to completion of the closure of the left atriotomy, the left ventricle and the left atrium are allowed to fill with blood to remove all air from the system.  The Foley catheter was subsequently removed as the left atriotomy closure was completed.  The single proximal saphenous vein anastomosis was performed to the ascending aorta prior to removal of the aortic crossclamp.  The patient was placed in steep Trendelenburg position.  One last dose of warm retrograde hot shot cardioplegia was administered.  The heart was allowed to fill and the lungs were ventilated to facilitate removal of all air from the pulmonary veins, left atrium, left ventricle, and aortic root.  After completion of deairing maneuvers, the aortic crossclamp was removed and the heart was emptied completely.  The retrograde cardioplegia catheter was removed.  The total crossclamp time for the operation was 140 minutes.  The  left atriotomy closure was inspected for hemostasis.  The saphenous vein graft was inspected for hemostasis at both proximal and distal anastomosis and appropriate graft orientation was verified.  The heart began to beat spontaneously without need for cardioversion.  Epicardial pacing wires were affixed to a right ventricular outflow tract and to the right atrial appendage.  The inferior vena caval cannula was removed after ventilation was begun.  A brief look under transesophageal echocardiogram was performed to make sure that all air had been removed from the left heart circulation.  The patient was weaned from cardiopulmonary bypass without difficulty.  The patients rhythm at separation from bypass is normal sinus rhythm.  The patient was weaned from bypass on low-dose milrinone and dopamine.  Total cardiopulmonary bypass time for the operation was 177 minutes.  Following separation from bypass, follow-up transesophageal echocardiogram was performed by Dr. Conrad Parker.  This demonstrated well preserved left ventricular function. There was a well seated mechanical valve in the mitral position.  There was no sign of mitral regurgitation.  There was no aortic insufficiency.  No other abnormalities were identified.  The superior vena cava cannula was removed uneventfully.  The arterial cannula was removed uneventfully.  Protamine was administered to reverse the anticoagulation.  The mediastinum was irrigated with saline solution containing vancomycin.  Meticulous surgical hemostasis was ascertained.  The mediastinum was drained with two chest tubes placed through separate incisions inferiorly.  The median sternotomy was closed in routine fashion. The right lower extremity incisions were closed in multiple layers in routine fashion.  All skin incisions were closed with subcuticular skin closures.  The patient tolerated the procedure well and was transported to the surgical intensive care unit  in stable condition.  There were no intraoperative complications.  All sponge, needle and instrument counts were verified correct at the completion of the operation.  No autologous blood products were administered. DD:  07/29/99 TD:  07/31/99 Job: [id number]

## 2010-08-01 NOTE — Discharge Summary (Signed)
[place]. [facility]  Patient:    Mario Powell, Mario Powell                          MRN: [id number] Adm. Date:  [id number] Disc. Date: [id number] Attending:  Darylene Price Dictator:   Sharene Butters, P.A.                           Discharge Summary  DATE OF BIRTH:  12-Jan-1946  DISCHARGE DIAGNOSES:  1. 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  2. Coronary artery disease, status post coronary artery bypass grafting.  3. Acute renal insufficiency.  4. Jaundice, Jul 23, 1999, resolved.  5. Volume overload.  6. History of _________ , resolved.  7. History of atrial flutter with rapid ventricular rate, status post DCCV     on Jul 23, 1999.  8. Coumadin therapy secondary to massive vitreous retraction.  9. Decreased appetite, resolved. 10. History of gout.  SURGERIES/PROCEDURES:  1. Status post CABG x 1 of the distal RCA on Jul 29, 1999, Dr. Roxy Manns.  2. 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 Roxy Manns, Jul 29, 1999.  3. Status post TEE for evaluation of arterial endocarditis on mitral valve,     preoperatively on Jul 28, 1999.  4. Status post DCCV on Jul 23, 1999, secondary to atrial flutter with rapid     ventricular rate and no response to IV medications.  OTHER STUDIES:  1. Normal carotid ultrasound on Jul 25, 1999.  2. Blood cultures, showing methicillin sensitive Staph aureus as in blood     culture, mitral valve leaflet, negative.  MEDICATIONS ON DISCHARGE:  1. Altace 2.5 mg one p.o. q.d.  2. K-Dur 20 mEq one p.o. b.i.d.  3. Lasix 80 mg one p.o. b.i.d.  4. Pepcid 20 mg one p.o. q.h.s.  5. Aspirin 81 mg one p.o. q.d.  6. Coumadin as directed by cardiology.  7. Ancef 2 g IV q.8h. per home health nurse.  8. Darvocet-N 100 one or two p.o. q.4-6h. p.r.n. for pain.  ALLERGIES:  PENICILLIN.  FOLLOW-UP:  Follow up with Dr. Tamala Julian in two weeks after discharge.  Follow up with Dr.  Roxy Manns three weeks after discharge.  The office will call.  HISTORY OF PRESENT ILLNESS:  [name] is a 65 year old white male with no previous cardiac history who developed acute flu-like symptoms and febrile illness and rapidly developed symptoms of class 4 congestive heart failure. He presented to the emergency room and he was noted to be in rapid atrial flutter with heart rate in the 180s.  He ultimately underwent DC cardioversion and blood cultures obtained at that time grew out Staph aureus which was a sensitive species to all penicillin containing products.  Echocardiogram including both transthoracic and transesophageal demonstrated severe 4+ mitral regurgitation with preserved left ventricular function.  There was severe prolapse of the posterior leaflet and mitral valve, with what appeared to be vegetations involving the mitral valve.  There were no obvious signs of any endocarditis involving the aortic valve, and tricuspid valve.  No other specific abnormalities were identified.  Cardiac catheterization demonstrated basal coronary artery disease.  Right heart catheterization demonstrated severe pulmonary hypertension with PA pressures in the 80s with preserved antegrade cardiac output.  On Jul 29, 1999 the patient underwent coronary artery bypass  x 1 of the distal RCA, with mitral valve replacement with a #31 St. Jude mechanical valve.  The cultures of valve and vegetation were sent to the lab, _________ stat Grams stain was obtained, which showed gram positive cocci in pairs.  The patient did not require any blood transfusion during the procedure.  Off the operating room, the patient was in normal sinus rhythm, with a low dose of milrinone and dopamine.  Later that afternoon, the patient was extubated, awake, and alert.  He felt remarkably well.  Neuro exam was grossly intact symmetrically.  The rest of his exam was unremarkable.  LABORATORY:  Within normal limits.  On Jul 30, 1999 the patient was overall feeling good, without complaints.  He was afebrile, in normal sinus rhythm, and his blood pressure was stable.  His physical exam was unremarkable.  His creatinine was slightly elevated to 1.9. As well he was volume overloaded.  Diuresis was started, and his renal functions were to be evaluated, and monitored closely.  At the meantime, he was to continue in a renal dose of dopamine.  On Jul 31, 1999, the patient continued to be feeling well, having steady progress.  He was afebrile, and in normal sinus rhythm.  His renal insufficiency was improving.  He was to continue diuresis, and he was transferred to the unit 2000 in stable condition.  Coumadin was started.  He was also continued on his IV Ancef therapy for his bacterial endocarditis, as recommended by infectious disease. On Aug 01, 1999, he continued to be volume overloaded, but otherwise he was showing a remarkable recovery.  B.i.d. Lasix was continued, and since his renal functions improved, ACE inhibitor was added to the medicine regimen.  On Aug 02, 1999, the patients condition continued to improve.  His vital signs were stable, he was afebrile, he continued in normal sinus rhythm.  Because he continued to be volume overloaded, Lasix was increased to 80 mg b.i.d., his potassium was increased as well, and increased mobility was recommended. Later that afternoon, he tolerated the walk, with increased distance.  His O2 saturations were within normal limits.  No complications were observed.  On May 19 at 3:30 p.m., the patient remained afebrile, and valve cultures returned showing mitral valve leaflet negative for bacteria, and the _________  also was showing methicillin sensitive Staph aureus as in the blood culture.  Infectious disease recommended that the patient continue on Ancef 4-6 weeks after hospitalization.  On Aug 03, 1999, the patient continues to be improving, his vital signs are stable, and his  exam is normal, with the exception of this bilateral lower extremity edema, responding slowly to diuretic therapy.  The patient is overall feeling well.  His swelling is  slightly improved.  He remains in normal sinus rhythm, and his INR is within normal limits.  Discharge on Aug 04, 1999 is anticipated.  The first INR will be drawn by home health nurse on Wednesday, May 23 after discharge.  PROCEDURES:  Status post cardiac catheterization on Jul 25, 1999, by Dr. Pernell Dupre.  CONDITION ON DISCHARGE:  Stable. DD:  08/03/99 TD:  08/03/99 Job: [id number]

## 2010-08-01 NOTE — Consult Note (Signed)
[place]. [facility]  Patient:    Mario Powell, Mario Powell                          MRN: [id number] Proc. Date: 07/28/99 Adm. Date:  [id number] Disc. Date: [id number] Attending:  Darylene Price Dictator:   Teena Dunk, D.D.S. CC:         Valentina Gu. Roxy Manns, M.D.                          Consultation Report  DATE OF BIRTH:  08/23/1945  HISTORY OF PRESENT ILLNESS:  Mario Powell is a 65 year old white male referred by Dr. Darylene Price for a dental consultation.  The patient was admitted on Jul 23, 1999, and subsequently diagnosed with subacute bacterial endocarditis with gram positive cocci.  The patient also found to have coronary artery disease and severe mitral regurgitation and is scheduled for a mitral valve replacement/coronary artery bypass graft surgery with Dr. Roxy Manns.  The patient is now seen as part of a pre mitral valve replacement heart surgery dental protocol.  PAST MEDICAL HISTORY: 1. Bacterial endocarditis, gram positive cocci.  Current Ancef and gentamicin    IV therapy. 2. Severe mitral regurgitation with anticipated mitral valve replacement heart    surgery with Dr. Roxy Manns. 3. Coronary artery disease with anticipated coronary artery bypass graft    surgery with a mitral valve replacement. 4. History of atrial flutter.  Status post electrocardioversion on 07/23/99.    Currently on procainamide IV. 5. History of arthralgias. 6. Acute renal failure, improving. 7. History of gout.  ALLERGIES/ADVERSE DRUG REACTIONS:  PENICILLIN.  MEDICATIONS (PER MAR): 1. Ancef 1 g IV q.8h. 2. Gentamicin 40 mg IV q.8h. 3. Altace 1.25 mg b.i.d. 4. Thiamine 100 mg daily. 5. Protonix 40 mg daily. 6. Digoxin 0.25 mg daily. 7. Prednisone 7.5 mg b.i.d. 8. Procainamide 1000 mg IV per protocol.  SOCIAL HISTORY:  The patient is married and lives with his wife and two children.  The patient is with a history of heavy tobacco use in the past, quit smoking in 1986.  The patient  also has a modest alcohol consumption recently with a heavier use in the past.  FAMILY HISTORY:  Significant for coronary artery disease.  Father died at age 33 from myocardial infarction.  Several uncles also have died from coronary artery disease at an early age.  FUNCTIONAL ASSESSMENT:  The patient was independent for activities of daily living prior to this admission.  REVIEW OF SYSTEMS:  (Reviewed from the chart, history and physical, this admission).  DENTAL HISTORY/CHIEF COMPLAINT:  The patient needs a dental consultation prior to anticipated mitral valve replacement heart surgery.  HISTORY OF PRESENT ILLNESS:  The patient was admitted on 07/23/99 and diagnosed with subacute bacterial endocarditis.  The patient has severe mitral regurgitation and coronary artery disease and is scheduled for mitral valve replacement/coronary artery bypass graft surgery with Dr. Roxy Manns.  The patient is now seen as part of a pre mitral valve replacement heart surgery dental protocol.  The patient currently denies toothaches, swellings, or abscess formation.  The patient has been undergoing active dental treatment at [facility] by patient report.  The patient with recent crown preparation and temporization of tooth #18 approximately 3-4 months ago.  The patients last dental cleaning was less than six months ago by patient report.  DENTAL EXAMINATION:  GENERAL:  The patient is a well-developed, well-nourished, white male  laying in hospital bed in no acute distress.  HEENT:  There is no palpable lymphadenopathy.  The patient denies acute TMJ symptoms at this time.  INTRAORAL EXAMINATION:  The patient has no apparent soft tissue pathology which is noted.  The patient has normal saliva.  PERIODONTAL:  The patient has chronic periodontal disease with incipient bone loss.  The patient has good oral hygiene with no evidence of plaque or calculus formation at this time.  There are several localized  areas of gingival recession noted.  DENTITION:  There are multiple missing teeth, numbers 1, 19, 20, and 30. Tooth #16 is impacted.  DENTAL CARIES/SUBOPTIMAL RESTORATIONS:  There are several broken restorations which are noted.  The patient denies current acute symptoms associated with these, and will be seeking dental treatment by his private dentist.  ENDODONTIC:  There is no history of acute pulpitis symptoms.  There is a previous root canal therapy on tooth numbers 18, 25, and 32, with no apparent persistent periapical pathology or symptoms.  CROWN AND BRIDGE:  There are several crown and bridge restorations noted. Tooth #18 has a current temporary crown in place.  PROSTHODONTICS:  There is no history of removable prostheses.  OCCLUSION:  The patient has a poor occlusal scheme secondary to multiple missing teeth, supraeruption, and drifting of the unopposed teeth into the edentulous areas, and lack of replacement of the missing teeth with definitive dental prostheses.  RADIOGRAPHIC INTERPRETATION:  (Panoramic x-ray was taken on 07/25/99). Multiple missing teeth numbers 1, 19, 20, and 30.  There is an impacted tooth #16 present.  There is incipient horizontal vertical bone loss noted.  There are multiple amalgam, resin, and crown and bridge restorations which are noted.  There is supraeruption and drifting of the unopposed teeth into the edentulous areas.  There are multiple root canal therapies on tooth numbers 18, 25, and 32 with no apparent persistent periapical radiolucency.  ASSESSMENT:  1. Chronic periodontal disease with incipient bone loss.  2. Gingival recession.  3. Several suboptimal dental restorations in need of replacement.  4. Temporary crown on tooth #18.  5. Multiple missing teeth.  6. Impacted tooth #16.  7. Supraeruption and drifting of the unopposed teeth into the edentulous     areas.  8. Lack of replacement of the missing teeth with dental  prostheses.  9. Poor occlusal scheme secondary to numbers 5, 7, and 8 as above.  10. History of previous root canal therapy on tooth numbers 18, 25, and 32     with no apparent persistent periapical radiolucency or symptoms.  PLAN/RECOMMENDATIONS: 1. I discussed the risks, benefits, and complications of various treatment    options with the patient in relationship to medical and dental condition    and anticipate a mitral valve replacement/coronary artery bypass graft    heart surgery.  I discussed periodontal therapy, dental restorations, crown    and bridge therapy, and fabrication of definitive dental prostheses to    replace the missing teeth.  The patient refuses treatment at this time and    will follow up with his private dentist for dental care once he is stable    enough to proceed with dental care. 2. Provision of written and verbal information on heart valve surgery and the    need for oral hygiene and dental care. 3. Discussion of the need for antibiotic therapy prior to invasive dental    procedures per AHA guidelines. 4. Discussion of findings with Dr. Roxy Manns and provision of  dental clearance at    this time.  Dr. Roxy Manns is to reconsult for acute dental problems during this    admission. 5. Suggest initiation of chlorhexidine rinse 0.12% rinsing with 15 mL b.i.d.    The patient is to spit out the excess and not swallow the rinse. DD:  07/29/99 TD:  07/29/99 Job: [id number]

## 2011-03-20 ENCOUNTER — Ambulatory Visit (INDEPENDENT_AMBULATORY_CARE_PROVIDER_SITE_OTHER): Payer: Medicare Other

## 2011-03-20 DIAGNOSIS — R05 Cough: Secondary | ICD-10-CM | POA: Diagnosis not present

## 2011-03-20 DIAGNOSIS — D649 Anemia, unspecified: Secondary | ICD-10-CM

## 2011-03-20 DIAGNOSIS — R059 Cough, unspecified: Secondary | ICD-10-CM

## 2011-05-01 DIAGNOSIS — Z7901 Long term (current) use of anticoagulants: Secondary | ICD-10-CM | POA: Diagnosis not present

## 2011-05-01 DIAGNOSIS — Z79899 Other long term (current) drug therapy: Secondary | ICD-10-CM | POA: Diagnosis not present

## 2011-05-01 DIAGNOSIS — Z954 Presence of other heart-valve replacement: Secondary | ICD-10-CM | POA: Diagnosis not present

## 2011-05-01 DIAGNOSIS — E785 Hyperlipidemia, unspecified: Secondary | ICD-10-CM | POA: Diagnosis not present

## 2011-05-01 DIAGNOSIS — I4891 Unspecified atrial fibrillation: Secondary | ICD-10-CM | POA: Diagnosis not present

## 2011-05-01 DIAGNOSIS — I509 Heart failure, unspecified: Secondary | ICD-10-CM | POA: Diagnosis not present

## 2011-05-01 DIAGNOSIS — I2510 Atherosclerotic heart disease of native coronary artery without angina pectoris: Secondary | ICD-10-CM | POA: Diagnosis not present

## 2011-10-10 ENCOUNTER — Telehealth: Payer: Self-pay

## 2011-10-10 NOTE — Telephone Encounter (Signed)
PT HAS RECEIVED A BILL FOR HIS SERVICES IN EARLY January.  PLEASE REFILE ALL THREE INSURANCES  ACCOUNT NUMBER [id number]

## 2011-10-12 NOTE — Telephone Encounter (Signed)
Janelle to refile per pt request.

## 2011-10-28 ENCOUNTER — Ambulatory Visit (INDEPENDENT_AMBULATORY_CARE_PROVIDER_SITE_OTHER): Payer: Self-pay | Admitting: General Surgery

## 2011-10-29 DIAGNOSIS — I059 Rheumatic mitral valve disease, unspecified: Secondary | ICD-10-CM | POA: Diagnosis not present

## 2011-11-05 DIAGNOSIS — I059 Rheumatic mitral valve disease, unspecified: Secondary | ICD-10-CM | POA: Diagnosis not present

## 2011-11-23 DIAGNOSIS — I059 Rheumatic mitral valve disease, unspecified: Secondary | ICD-10-CM | POA: Diagnosis not present

## 2011-11-23 DIAGNOSIS — E785 Hyperlipidemia, unspecified: Secondary | ICD-10-CM | POA: Diagnosis not present

## 2012-01-11 DIAGNOSIS — C61 Malignant neoplasm of prostate: Secondary | ICD-10-CM | POA: Diagnosis not present

## 2012-01-18 DIAGNOSIS — C61 Malignant neoplasm of prostate: Secondary | ICD-10-CM | POA: Diagnosis not present

## 2012-02-22 DIAGNOSIS — Z23 Encounter for immunization: Secondary | ICD-10-CM | POA: Diagnosis not present

## 2012-05-24 DIAGNOSIS — I059 Rheumatic mitral valve disease, unspecified: Secondary | ICD-10-CM | POA: Diagnosis not present

## 2012-05-24 DIAGNOSIS — E7800 Pure hypercholesterolemia, unspecified: Secondary | ICD-10-CM | POA: Diagnosis not present

## 2012-05-31 DIAGNOSIS — I10 Essential (primary) hypertension: Secondary | ICD-10-CM | POA: Diagnosis not present

## 2012-05-31 DIAGNOSIS — Z7901 Long term (current) use of anticoagulants: Secondary | ICD-10-CM | POA: Diagnosis not present

## 2012-05-31 DIAGNOSIS — Z954 Presence of other heart-valve replacement: Secondary | ICD-10-CM | POA: Diagnosis not present

## 2012-05-31 DIAGNOSIS — E785 Hyperlipidemia, unspecified: Secondary | ICD-10-CM | POA: Diagnosis not present

## 2012-05-31 DIAGNOSIS — I2510 Atherosclerotic heart disease of native coronary artery without angina pectoris: Secondary | ICD-10-CM | POA: Diagnosis not present

## 2012-05-31 DIAGNOSIS — I4891 Unspecified atrial fibrillation: Secondary | ICD-10-CM | POA: Diagnosis not present

## 2012-05-31 DIAGNOSIS — Z79899 Other long term (current) drug therapy: Secondary | ICD-10-CM | POA: Diagnosis not present

## 2012-06-21 DIAGNOSIS — Z954 Presence of other heart-valve replacement: Secondary | ICD-10-CM | POA: Diagnosis not present

## 2012-06-21 DIAGNOSIS — Z7901 Long term (current) use of anticoagulants: Secondary | ICD-10-CM | POA: Diagnosis not present

## 2012-07-19 DIAGNOSIS — Z954 Presence of other heart-valve replacement: Secondary | ICD-10-CM | POA: Diagnosis not present

## 2012-07-19 DIAGNOSIS — Z7901 Long term (current) use of anticoagulants: Secondary | ICD-10-CM | POA: Diagnosis not present

## 2012-08-31 DIAGNOSIS — Z954 Presence of other heart-valve replacement: Secondary | ICD-10-CM | POA: Diagnosis not present

## 2012-08-31 DIAGNOSIS — Z7901 Long term (current) use of anticoagulants: Secondary | ICD-10-CM | POA: Diagnosis not present

## 2012-09-29 DIAGNOSIS — Z7901 Long term (current) use of anticoagulants: Secondary | ICD-10-CM | POA: Diagnosis not present

## 2012-09-29 DIAGNOSIS — Z954 Presence of other heart-valve replacement: Secondary | ICD-10-CM | POA: Diagnosis not present

## 2012-11-02 DIAGNOSIS — Z7901 Long term (current) use of anticoagulants: Secondary | ICD-10-CM | POA: Diagnosis not present

## 2012-11-02 DIAGNOSIS — Z954 Presence of other heart-valve replacement: Secondary | ICD-10-CM | POA: Diagnosis not present

## 2012-11-30 DIAGNOSIS — Z954 Presence of other heart-valve replacement: Secondary | ICD-10-CM | POA: Diagnosis not present

## 2012-11-30 DIAGNOSIS — Z7901 Long term (current) use of anticoagulants: Secondary | ICD-10-CM | POA: Diagnosis not present

## 2013-01-11 ENCOUNTER — Other Ambulatory Visit: Payer: Self-pay

## 2013-01-11 ENCOUNTER — Telehealth: Payer: Self-pay

## 2013-01-11 ENCOUNTER — Ambulatory Visit (INDEPENDENT_AMBULATORY_CARE_PROVIDER_SITE_OTHER): Payer: Medicare Other | Admitting: Pharmacist

## 2013-01-11 ENCOUNTER — Other Ambulatory Visit (INDEPENDENT_AMBULATORY_CARE_PROVIDER_SITE_OTHER): Payer: Medicare Other

## 2013-01-11 DIAGNOSIS — E785 Hyperlipidemia, unspecified: Secondary | ICD-10-CM

## 2013-01-11 DIAGNOSIS — Z954 Presence of other heart-valve replacement: Secondary | ICD-10-CM | POA: Diagnosis not present

## 2013-01-11 DIAGNOSIS — I059 Rheumatic mitral valve disease, unspecified: Secondary | ICD-10-CM | POA: Insufficient documentation

## 2013-01-11 DIAGNOSIS — Z952 Presence of prosthetic heart valve: Secondary | ICD-10-CM

## 2013-01-11 HISTORY — DX: Presence of prosthetic heart valve: Z95.2

## 2013-01-11 LAB — ALT: ALT: 40 U/L (ref 0–53)

## 2013-01-11 LAB — LIPID PANEL
LDL Cholesterol: 73 mg/dL (ref 0–99)
Total CHOL/HDL Ratio: 3

## 2013-01-11 NOTE — Telephone Encounter (Signed)
[email or web address] ok repeat in 1 year

## 2013-01-11 NOTE — Telephone Encounter (Signed)
Message copied by Lamar Laundry on Wed Jan 11, 2013  5:25 PM ------      Message from: Daneen Schick      Created: Wed Jan 11, 2013  5:18 PM       Lipid and liver panel are at target. Repeat in 1 year. ------

## 2013-01-25 DIAGNOSIS — C61 Malignant neoplasm of prostate: Secondary | ICD-10-CM | POA: Diagnosis not present

## 2013-02-03 ENCOUNTER — Other Ambulatory Visit: Payer: Self-pay | Admitting: Interventional Cardiology

## 2013-02-22 ENCOUNTER — Ambulatory Visit (INDEPENDENT_AMBULATORY_CARE_PROVIDER_SITE_OTHER): Payer: Medicare Other | Admitting: Pharmacist

## 2013-02-22 DIAGNOSIS — Z954 Presence of other heart-valve replacement: Secondary | ICD-10-CM

## 2013-02-22 DIAGNOSIS — I059 Rheumatic mitral valve disease, unspecified: Secondary | ICD-10-CM | POA: Diagnosis not present

## 2013-02-22 LAB — POCT INR: INR: 3.8

## 2013-03-02 DIAGNOSIS — Z23 Encounter for immunization: Secondary | ICD-10-CM | POA: Diagnosis not present

## 2013-03-23 ENCOUNTER — Ambulatory Visit (INDEPENDENT_AMBULATORY_CARE_PROVIDER_SITE_OTHER): Payer: Medicare Other

## 2013-03-23 DIAGNOSIS — I059 Rheumatic mitral valve disease, unspecified: Secondary | ICD-10-CM

## 2013-03-23 DIAGNOSIS — Z954 Presence of other heart-valve replacement: Secondary | ICD-10-CM | POA: Diagnosis not present

## 2013-03-23 LAB — POCT INR: INR: 3.1

## 2013-04-13 ENCOUNTER — Other Ambulatory Visit: Payer: Self-pay | Admitting: Interventional Cardiology

## 2013-04-18 ENCOUNTER — Other Ambulatory Visit: Payer: Self-pay | Admitting: Interventional Cardiology

## 2013-04-27 ENCOUNTER — Ambulatory Visit (INDEPENDENT_AMBULATORY_CARE_PROVIDER_SITE_OTHER): Payer: Medicare Other | Admitting: *Deleted

## 2013-04-27 DIAGNOSIS — Z954 Presence of other heart-valve replacement: Secondary | ICD-10-CM | POA: Diagnosis not present

## 2013-04-27 DIAGNOSIS — I059 Rheumatic mitral valve disease, unspecified: Secondary | ICD-10-CM

## 2013-04-27 LAB — POCT INR: INR: 3.8

## 2013-05-25 ENCOUNTER — Ambulatory Visit (INDEPENDENT_AMBULATORY_CARE_PROVIDER_SITE_OTHER): Payer: Medicare Other | Admitting: Pharmacist

## 2013-05-25 DIAGNOSIS — Z954 Presence of other heart-valve replacement: Secondary | ICD-10-CM | POA: Diagnosis not present

## 2013-05-25 DIAGNOSIS — I059 Rheumatic mitral valve disease, unspecified: Secondary | ICD-10-CM | POA: Diagnosis not present

## 2013-05-25 LAB — POCT INR: INR: 2.8

## 2013-05-31 ENCOUNTER — Ambulatory Visit: Payer: Self-pay | Admitting: Interventional Cardiology

## 2013-06-13 ENCOUNTER — Telehealth: Payer: Self-pay

## 2013-06-13 MED ORDER — WARFARIN SODIUM 1 MG PO TABS: ORAL_TABLET | ORAL | Status: DC

## 2013-06-13 NOTE — Telephone Encounter (Signed)
Prescription for warfarin 1 mg pills sent to pharmacy.

## 2013-06-22 ENCOUNTER — Ambulatory Visit (INDEPENDENT_AMBULATORY_CARE_PROVIDER_SITE_OTHER): Payer: Medicare Other

## 2013-06-22 DIAGNOSIS — I059 Rheumatic mitral valve disease, unspecified: Secondary | ICD-10-CM

## 2013-06-22 DIAGNOSIS — Z954 Presence of other heart-valve replacement: Secondary | ICD-10-CM

## 2013-06-22 LAB — POCT INR: INR: 2.7

## 2013-06-26 ENCOUNTER — Telehealth: Payer: Self-pay | Admitting: Interventional Cardiology

## 2013-06-26 NOTE — Telephone Encounter (Signed)
Spoke with patient. Pt has appt with Dr Tamala Julian 07/11/13 and will call back if he needs medication refills before that appt.

## 2013-06-26 NOTE — Telephone Encounter (Signed)
New problem     Pt will call back with medications needed.

## 2013-06-27 ENCOUNTER — Ambulatory Visit: Payer: Medicare Other | Admitting: Interventional Cardiology

## 2013-06-30 ENCOUNTER — Other Ambulatory Visit: Payer: Self-pay | Admitting: Interventional Cardiology

## 2013-07-11 ENCOUNTER — Ambulatory Visit: Payer: Medicare Other | Admitting: Interventional Cardiology

## 2013-07-12 ENCOUNTER — Ambulatory Visit (INDEPENDENT_AMBULATORY_CARE_PROVIDER_SITE_OTHER): Payer: Medicare Other | Admitting: Interventional Cardiology

## 2013-07-12 ENCOUNTER — Encounter: Payer: Self-pay | Admitting: Interventional Cardiology

## 2013-07-12 ENCOUNTER — Encounter (INDEPENDENT_AMBULATORY_CARE_PROVIDER_SITE_OTHER): Payer: Self-pay

## 2013-07-12 VITALS — BP 146/71 | HR 82 | Ht 71.0 in | Wt 202.0 lb

## 2013-07-12 DIAGNOSIS — I10 Essential (primary) hypertension: Secondary | ICD-10-CM

## 2013-07-12 DIAGNOSIS — C61 Malignant neoplasm of prostate: Secondary | ICD-10-CM

## 2013-07-12 DIAGNOSIS — Z954 Presence of other heart-valve replacement: Secondary | ICD-10-CM

## 2013-07-12 DIAGNOSIS — I4891 Unspecified atrial fibrillation: Secondary | ICD-10-CM

## 2013-07-12 DIAGNOSIS — I059 Rheumatic mitral valve disease, unspecified: Secondary | ICD-10-CM | POA: Diagnosis not present

## 2013-07-12 DIAGNOSIS — I25810 Atherosclerosis of coronary artery bypass graft(s) without angina pectoris: Secondary | ICD-10-CM | POA: Diagnosis not present

## 2013-07-12 DIAGNOSIS — Z7901 Long term (current) use of anticoagulants: Secondary | ICD-10-CM

## 2013-07-12 HISTORY — DX: Malignant neoplasm of prostate: C61

## 2013-07-12 HISTORY — DX: Long term (current) use of anticoagulants: Z79.01

## 2013-07-12 HISTORY — DX: Essential (primary) hypertension: I10

## 2013-07-12 HISTORY — DX: Atherosclerosis of coronary artery bypass graft(s) without angina pectoris: I25.810

## 2013-07-12 HISTORY — DX: Unspecified atrial fibrillation: I48.91

## 2013-07-12 NOTE — Patient Instructions (Signed)
Your physician recommends that you continue on your current medications as directed. Please refer to the Current Medication list given to you today.  Your physician has requested that you have an echocardiogram. Echocardiography is a painless test that uses sound waves to create images of your heart. It provides your doctor with information about the size and shape of your heart and how well your heart's chambers and valves are working. This procedure takes approximately one hour. There are no restrictions for this procedure.   Your physician wants you to follow-up in: 1 year You will receive a reminder letter in the mail two months in advance. If you don't receive a letter, please call our office to schedule the follow-up appointment.

## 2013-07-12 NOTE — Progress Notes (Signed)
Patient ID: Mario Powell, male   DOB: [birth year], 68 y.o.   MRN: [id number]    [address], [address] [place], [place]  [address] Phone: [phone] Fax:  [phone]  Date:  07/12/2013   ID:  Mario Powell, DOB 09/08/1945, MRN [id number]  PCP:  No primary provider on file.   ASSESSMENT:  1. Mechanical mitral valve for mitral regurgitation and 2001. Asymptomatic 2. Postoperative atrial fibrillation, resolved without recurrences over the last 14 years 3. Chronic anticoagulation therapy without bleeding 4. Coronary artery disease with saphenous vein grafting in 2001 at time of mitral valve replacement 5. Hypertension, controlled 5. Hyperlipidemia, on therapy  PLAN:  1. 2-D Doppler echocardiogram to reassess valve function and LV size/function 2. Clinical followup in one year 3. No change in therapy 4. Followup in Coumadin clinic   SUBJECTIVE: Mario Powell is a 68 y.o. male who is doing well. He is now 14 years out from single coronary bypass grafting and mitral valve replacement with mechanical prosthesis. There no cardiopulmonary complaints. He denies orthopnea, PND, palpitations, TIA, bleeding on Coumadin, orthopnea, and syncope.   Wt Readings from Last 3 Encounters:  07/12/13 202 lb (91.627 kg)     History reviewed. No pertinent past medical history.  Current Outpatient Prescriptions  Medication Sig Dispense Refill  . aspirin 81 MG tablet Take 81 mg by mouth daily.      . CRESTOR 40 MG tablet TAKE 1 TABLET ONCE A DAY  90 tablet  1  . hydrochlorothiazide (HYDRODIURIL) 25 MG tablet TAKE 1 TABLET EVERY MORNING  90 tablet  0  . NIASPAN 1000 MG CR tablet TAKE 1 TABLET AT BEDTIME AS DIRECTED  90 tablet  1  . ramipril (ALTACE) 10 MG capsule TAKE 1 CAPSULE TWICE A DAY  180 capsule  1  . warfarin (COUMADIN) 1 MG tablet Take 1 tablet on all days except 2 tablets on Tuesday, Thursday, and Saturday, or as directed by Coumadin Clinic.  135 tablet  1  . ZETIA 10 MG tablet TAKE 1  TABLET ONCE A DAY  90 tablet  1   No current facility-administered medications for this visit.    Allergies:    Allergies  Allergen Reactions  . Penicillins     Social History:  The patient  reports that he has quit smoking. He does not have any smokeless tobacco history on file.   ROS:  Please see the history of present illness.   Bilateral lower extremity and wrist puffiness likely related to lymphedema. This is been chronically present.   All other systems reviewed and negative.   OBJECTIVE: VS:  BP 146/71  Pulse 82  Ht 5\' 11"  (1.803 m)  Wt 202 lb (91.627 kg)  BMI 28.19 kg/m2 Well nourished, well developed, in no acute distress, compatible with age 67: normal Neck: JVD flat. Carotid bruit absent  Cardiac:  normal S1, S2; RRR;. Mechanical valve closure sounds are crisp. No murmurIs heard. Lungs:  clear to auscultation bilaterally, no wheezing, rhonchi or rales Abd: soft, nontender, no hepatomegaly Ext: Edema bilateral and and lower extremity swelling right leg greater than left compatible with lymphedema. Pulses 2+ Skin: warm and dry Neuro:  CNs 2-12 intact, no focal abnormalities noted  EKG:  Normal sinus rhythm, left atrial abnormality, PVC, rightward axis the       Signed, Illene Labrador III, MD 07/12/2013 11:22 AM

## 2013-07-20 ENCOUNTER — Ambulatory Visit (INDEPENDENT_AMBULATORY_CARE_PROVIDER_SITE_OTHER): Payer: Medicare Other | Admitting: *Deleted

## 2013-07-20 DIAGNOSIS — I059 Rheumatic mitral valve disease, unspecified: Secondary | ICD-10-CM | POA: Diagnosis not present

## 2013-07-20 DIAGNOSIS — Z954 Presence of other heart-valve replacement: Secondary | ICD-10-CM | POA: Diagnosis not present

## 2013-07-20 LAB — POCT INR: INR: 4.2

## 2013-07-27 ENCOUNTER — Ambulatory Visit (HOSPITAL_COMMUNITY): Payer: Medicare Other | Attending: Interventional Cardiology | Admitting: Radiology

## 2013-07-27 DIAGNOSIS — I059 Rheumatic mitral valve disease, unspecified: Secondary | ICD-10-CM

## 2013-07-27 DIAGNOSIS — Z954 Presence of other heart-valve replacement: Secondary | ICD-10-CM

## 2013-07-27 NOTE — Progress Notes (Signed)
Echocardiogram performed.  

## 2013-08-02 ENCOUNTER — Telehealth: Payer: Self-pay

## 2013-08-02 NOTE — Telephone Encounter (Signed)
pt given echo results.  Overall, the echo is stable. Valve functioning normally.  pt verbalized undertstanding.

## 2013-08-02 NOTE — Telephone Encounter (Signed)
Message copied by Lamar Laundry on Wed Aug 02, 2013  8:07 AM ------      Message from: Daneen Schick      Created: Tue Aug 01, 2013  7:26 PM       Overall, the echo is stable. Valve functioning normally. ------

## 2013-08-17 ENCOUNTER — Ambulatory Visit (INDEPENDENT_AMBULATORY_CARE_PROVIDER_SITE_OTHER): Payer: Medicare Other | Admitting: Pharmacist

## 2013-08-17 DIAGNOSIS — Z954 Presence of other heart-valve replacement: Secondary | ICD-10-CM | POA: Diagnosis not present

## 2013-08-17 DIAGNOSIS — I059 Rheumatic mitral valve disease, unspecified: Secondary | ICD-10-CM

## 2013-08-17 LAB — POCT INR: INR: 2.9

## 2013-09-05 ENCOUNTER — Other Ambulatory Visit: Payer: Self-pay | Admitting: Interventional Cardiology

## 2013-09-06 ENCOUNTER — Other Ambulatory Visit: Payer: Self-pay

## 2013-09-06 MED ORDER — RAMIPRIL 10 MG PO CAPS: ORAL_CAPSULE | ORAL | Status: DC

## 2013-09-10 ENCOUNTER — Other Ambulatory Visit: Payer: Self-pay | Admitting: Interventional Cardiology

## 2013-09-20 ENCOUNTER — Ambulatory Visit (INDEPENDENT_AMBULATORY_CARE_PROVIDER_SITE_OTHER): Payer: Medicare Other | Admitting: Surgery

## 2013-09-20 DIAGNOSIS — I059 Rheumatic mitral valve disease, unspecified: Secondary | ICD-10-CM | POA: Diagnosis not present

## 2013-09-20 DIAGNOSIS — Z954 Presence of other heart-valve replacement: Secondary | ICD-10-CM | POA: Diagnosis not present

## 2013-09-20 LAB — POCT INR: INR: 2.1

## 2013-10-18 ENCOUNTER — Ambulatory Visit (INDEPENDENT_AMBULATORY_CARE_PROVIDER_SITE_OTHER): Payer: Medicare Other | Admitting: Pharmacist

## 2013-10-18 DIAGNOSIS — Z954 Presence of other heart-valve replacement: Secondary | ICD-10-CM | POA: Diagnosis not present

## 2013-10-18 DIAGNOSIS — I059 Rheumatic mitral valve disease, unspecified: Secondary | ICD-10-CM

## 2013-10-18 LAB — POCT INR: INR: 2.8

## 2013-11-15 ENCOUNTER — Ambulatory Visit (INDEPENDENT_AMBULATORY_CARE_PROVIDER_SITE_OTHER): Payer: Medicare Other | Admitting: Pharmacist

## 2013-11-15 DIAGNOSIS — I059 Rheumatic mitral valve disease, unspecified: Secondary | ICD-10-CM

## 2013-11-15 DIAGNOSIS — Z954 Presence of other heart-valve replacement: Secondary | ICD-10-CM

## 2013-11-15 LAB — POCT INR: INR: 3.2

## 2013-12-05 ENCOUNTER — Other Ambulatory Visit: Payer: Self-pay | Admitting: Interventional Cardiology

## 2013-12-07 ENCOUNTER — Telehealth: Payer: Self-pay | Admitting: *Deleted

## 2013-12-07 NOTE — Telephone Encounter (Signed)
A representative from [facility] needs clarification on coumadin. Please call back at [phone] and the reference number is [id number]. Thanks, MI

## 2013-12-13 NOTE — Telephone Encounter (Signed)
Returned call to [facility] [employer], clarified rx on Warfarin 1mg  tablets, 135 tablets is a 90 day supply, sig take as directed by Coumadin clinic with 1 additional refill.

## 2013-12-20 ENCOUNTER — Ambulatory Visit (INDEPENDENT_AMBULATORY_CARE_PROVIDER_SITE_OTHER): Payer: Medicare Other | Admitting: *Deleted

## 2013-12-20 DIAGNOSIS — Z954 Presence of other heart-valve replacement: Secondary | ICD-10-CM

## 2013-12-20 DIAGNOSIS — I059 Rheumatic mitral valve disease, unspecified: Secondary | ICD-10-CM | POA: Diagnosis not present

## 2013-12-20 DIAGNOSIS — Z952 Presence of prosthetic heart valve: Secondary | ICD-10-CM

## 2013-12-20 LAB — POCT INR: INR: 2.3

## 2014-01-08 ENCOUNTER — Ambulatory Visit (INDEPENDENT_AMBULATORY_CARE_PROVIDER_SITE_OTHER): Payer: Medicare Other | Admitting: Pharmacist

## 2014-01-08 DIAGNOSIS — Z954 Presence of other heart-valve replacement: Secondary | ICD-10-CM

## 2014-01-08 DIAGNOSIS — I059 Rheumatic mitral valve disease, unspecified: Secondary | ICD-10-CM | POA: Diagnosis not present

## 2014-01-08 DIAGNOSIS — Z952 Presence of prosthetic heart valve: Secondary | ICD-10-CM

## 2014-01-08 LAB — POCT INR: INR: 2.3

## 2014-01-26 DIAGNOSIS — C61 Malignant neoplasm of prostate: Secondary | ICD-10-CM | POA: Diagnosis not present

## 2014-01-29 ENCOUNTER — Ambulatory Visit (INDEPENDENT_AMBULATORY_CARE_PROVIDER_SITE_OTHER): Payer: Medicare Other | Admitting: Pharmacist

## 2014-01-29 DIAGNOSIS — Z954 Presence of other heart-valve replacement: Secondary | ICD-10-CM | POA: Diagnosis not present

## 2014-01-29 DIAGNOSIS — I059 Rheumatic mitral valve disease, unspecified: Secondary | ICD-10-CM | POA: Diagnosis not present

## 2014-01-29 DIAGNOSIS — Z952 Presence of prosthetic heart valve: Secondary | ICD-10-CM

## 2014-01-29 LAB — POCT INR: INR: 2.2

## 2014-02-02 ENCOUNTER — Other Ambulatory Visit: Payer: Self-pay | Admitting: Interventional Cardiology

## 2014-02-02 DIAGNOSIS — E291 Testicular hypofunction: Secondary | ICD-10-CM | POA: Diagnosis not present

## 2014-02-02 DIAGNOSIS — E559 Vitamin D deficiency, unspecified: Secondary | ICD-10-CM | POA: Diagnosis not present

## 2014-02-02 DIAGNOSIS — M8580 Other specified disorders of bone density and structure, unspecified site: Secondary | ICD-10-CM | POA: Diagnosis not present

## 2014-02-02 DIAGNOSIS — C61 Malignant neoplasm of prostate: Secondary | ICD-10-CM | POA: Diagnosis not present

## 2014-02-19 ENCOUNTER — Ambulatory Visit (INDEPENDENT_AMBULATORY_CARE_PROVIDER_SITE_OTHER): Payer: Medicare Other | Admitting: Pharmacist

## 2014-02-19 DIAGNOSIS — Z954 Presence of other heart-valve replacement: Secondary | ICD-10-CM

## 2014-02-19 DIAGNOSIS — I059 Rheumatic mitral valve disease, unspecified: Secondary | ICD-10-CM

## 2014-02-19 DIAGNOSIS — Z952 Presence of prosthetic heart valve: Secondary | ICD-10-CM

## 2014-02-19 LAB — POCT INR: INR: 3.2

## 2014-03-14 ENCOUNTER — Other Ambulatory Visit: Payer: Self-pay | Admitting: Interventional Cardiology

## 2014-03-14 MED ORDER — RAMIPRIL 10 MG PO CAPS: ORAL_CAPSULE | ORAL | Status: DC

## 2014-03-15 ENCOUNTER — Other Ambulatory Visit: Payer: Self-pay | Admitting: *Deleted

## 2014-03-15 MED ORDER — RAMIPRIL 10 MG PO CAPS: 10.0000 mg | ORAL_CAPSULE | Freq: Two times a day (BID) | ORAL | Status: DC

## 2014-03-19 ENCOUNTER — Ambulatory Visit (INDEPENDENT_AMBULATORY_CARE_PROVIDER_SITE_OTHER): Payer: Medicare Other | Admitting: *Deleted

## 2014-03-19 DIAGNOSIS — Z954 Presence of other heart-valve replacement: Secondary | ICD-10-CM

## 2014-03-19 DIAGNOSIS — I059 Rheumatic mitral valve disease, unspecified: Secondary | ICD-10-CM | POA: Diagnosis not present

## 2014-03-19 DIAGNOSIS — Z952 Presence of prosthetic heart valve: Secondary | ICD-10-CM

## 2014-03-19 LAB — POCT INR: INR: 3.7

## 2014-04-16 ENCOUNTER — Ambulatory Visit (INDEPENDENT_AMBULATORY_CARE_PROVIDER_SITE_OTHER): Payer: Medicare Other

## 2014-04-16 DIAGNOSIS — Z954 Presence of other heart-valve replacement: Secondary | ICD-10-CM

## 2014-04-16 DIAGNOSIS — Z952 Presence of prosthetic heart valve: Secondary | ICD-10-CM

## 2014-04-16 DIAGNOSIS — I059 Rheumatic mitral valve disease, unspecified: Secondary | ICD-10-CM

## 2014-04-16 LAB — POCT INR: INR: 4.7

## 2014-05-07 ENCOUNTER — Other Ambulatory Visit: Payer: Self-pay | Admitting: Interventional Cardiology

## 2014-05-14 ENCOUNTER — Ambulatory Visit (INDEPENDENT_AMBULATORY_CARE_PROVIDER_SITE_OTHER): Payer: Medicare Other | Admitting: *Deleted

## 2014-05-14 DIAGNOSIS — Z954 Presence of other heart-valve replacement: Secondary | ICD-10-CM

## 2014-05-14 DIAGNOSIS — I059 Rheumatic mitral valve disease, unspecified: Secondary | ICD-10-CM | POA: Diagnosis not present

## 2014-05-14 DIAGNOSIS — Z952 Presence of prosthetic heart valve: Secondary | ICD-10-CM

## 2014-05-14 LAB — POCT INR: INR: 3.8

## 2014-06-12 ENCOUNTER — Ambulatory Visit (INDEPENDENT_AMBULATORY_CARE_PROVIDER_SITE_OTHER): Payer: Medicare Other

## 2014-06-12 DIAGNOSIS — Z954 Presence of other heart-valve replacement: Secondary | ICD-10-CM | POA: Diagnosis not present

## 2014-06-12 DIAGNOSIS — Z952 Presence of prosthetic heart valve: Secondary | ICD-10-CM

## 2014-06-12 DIAGNOSIS — I059 Rheumatic mitral valve disease, unspecified: Secondary | ICD-10-CM | POA: Diagnosis not present

## 2014-06-12 LAB — POCT INR: INR: 3.1

## 2014-06-24 ENCOUNTER — Other Ambulatory Visit: Payer: Self-pay | Admitting: Interventional Cardiology

## 2014-07-10 ENCOUNTER — Ambulatory Visit (INDEPENDENT_AMBULATORY_CARE_PROVIDER_SITE_OTHER): Payer: Medicare Other | Admitting: Pharmacist Clinician (PhC)/ Clinical Pharmacy Specialist

## 2014-07-10 DIAGNOSIS — Z952 Presence of prosthetic heart valve: Secondary | ICD-10-CM

## 2014-07-10 DIAGNOSIS — I059 Rheumatic mitral valve disease, unspecified: Secondary | ICD-10-CM | POA: Diagnosis not present

## 2014-07-10 DIAGNOSIS — Z954 Presence of other heart-valve replacement: Secondary | ICD-10-CM

## 2014-07-10 LAB — POCT INR: INR: 3

## 2014-07-16 ENCOUNTER — Encounter: Payer: Self-pay | Admitting: Interventional Cardiology

## 2014-07-16 ENCOUNTER — Ambulatory Visit (INDEPENDENT_AMBULATORY_CARE_PROVIDER_SITE_OTHER): Payer: Medicare Other | Admitting: Interventional Cardiology

## 2014-07-16 VITALS — BP 108/70 | HR 75 | Ht 71.0 in | Wt 208.1 lb

## 2014-07-16 DIAGNOSIS — Z954 Presence of other heart-valve replacement: Secondary | ICD-10-CM | POA: Diagnosis not present

## 2014-07-16 DIAGNOSIS — I25810 Atherosclerosis of coronary artery bypass graft(s) without angina pectoris: Secondary | ICD-10-CM | POA: Diagnosis not present

## 2014-07-16 DIAGNOSIS — I480 Paroxysmal atrial fibrillation: Secondary | ICD-10-CM | POA: Diagnosis not present

## 2014-07-16 DIAGNOSIS — Z952 Presence of prosthetic heart valve: Secondary | ICD-10-CM

## 2014-07-16 DIAGNOSIS — I10 Essential (primary) hypertension: Secondary | ICD-10-CM

## 2014-07-16 DIAGNOSIS — Z7901 Long term (current) use of anticoagulants: Secondary | ICD-10-CM | POA: Diagnosis not present

## 2014-07-16 NOTE — Progress Notes (Signed)
Cardiology Office Note   Date:  07/16/2014   ID:  Mario Powell, DOB Feb 02, 1946, MRN [id number]  PCP:  No primary care provider on file.  Cardiologist:   Sinclair Grooms, MD   Chief Complaint  Patient presents with  . Mitral Valve Prolapse    Endocarditis 2001      History of Present Illness: Mario Powell is a 69 y.o. male who presents for follow-up of coronary artery disease with prior bypass grafting, mechanical mitral valve, prior history of endocarditis, hypertension, chronic left greater than right lower extremity edema, history of paroxysmal atrial fibrillation, and chronic anticoagulation.  Patient has no complaints. He denies anginal quality chest pain. No palpitations or syncope. He has chronic bilateral lower extremity edema right greater than left. This is been present since he underwent whenever bypass grafting in 2001. His had no blood in his urine or stool. No head trauma.  Past Medical History  Diagnosis Date  . Prostate cancer 07/12/2013  . History of mitral valve replacement with mechanical valve 01/11/2013    Mechanical mitral valve 2001  Bacterial endocarditis as the cause   . Essential hypertension 07/12/2013  . Chronic anticoagulation 07/12/2013  . CAD (coronary artery disease) of artery bypass graft 07/12/2013    Saphenous vein graft 2001   . Atrial fibrillation 07/12/2013    Perioperative, 2001     Past Surgical History  Procedure Laterality Date  . Mitral valve replacement  2001    Mechanical prosthesis  . Coronary artery bypass graft      2001     Current Outpatient Prescriptions  Medication Sig Dispense Refill  . aspirin 81 MG tablet Take 81 mg by mouth daily.    . CRESTOR 40 MG tablet TAKE 1 TABLET ONCE DAILY 90 tablet 3  . hydrochlorothiazide (HYDRODIURIL) 25 MG tablet TAKE 1 TABLET DAILY 90 tablet 0  . NIASPAN 1000 MG CR tablet TAKE 1 TABLET AT BEDTIME AS DIRECTED 90 tablet 3  . ramipril (ALTACE) 10 MG capsule Take 1 capsule (10 mg total)  by mouth 2 (two) times daily. 180 capsule 1  . warfarin (COUMADIN) 1 MG tablet TAKE AS DIRECTED BY ANTICOAGULATION CLINIC 135 tablet 0  . ZETIA 10 MG tablet TAKE 1 TABLET ONCE DAILY 90 tablet 3   No current facility-administered medications for this visit.    Allergies:   Penicillins    Social History:  The patient  reports that he has quit smoking. He does not have any smokeless tobacco history on file.   Family History:  [name] family history includes Healthy in his mother, sister, and sister; Heart attack in his father.    ROS:  Please see the history of present illness.   Otherwise, review of systems are positive for leg edema, and weight loss (intentional).   All other systems are reviewed and negative.    PHYSICAL EXAM: VS:  BP 108/70 mmHg  Pulse 75  Ht 5\' 11"  (1.803 m)  Wt 208 lb 1.9 oz (94.403 kg)  BMI 29.04 kg/m2 , BMI Body mass index is 29.04 kg/(m^2). GEN: Well nourished, well developed, in no acute distress HEENT: normal Neck: no JVD, carotid bruits, or masses Cardiac: RRR; no murmurs, rubs, or gallops. Crisp Mechanical valve closure sounds are heard. Left greater than right lower extremity edema, 4+ and present for years. Respiratory:  clear to auscultation bilaterally, normal work of breathing GI: soft, nontender, nondistended, + BS MS: no deformity or atrophy Skin: warm and  dry, no rash Neuro:  Strength and sensation are intact Psych: euthymic mood, full affect   EKG:  EKG is ordered today. The ekg ordered today demonstrates normal.   Recent Labs: No results found for requested labs within last 365 days.    Lipid Panel    Component Value Date/Time   CHOL 125 01/11/2013 1147   TRIG 53.0 01/11/2013 1147   HDL 41.90 01/11/2013 1147   CHOLHDL 3 01/11/2013 1147   VLDL 10.6 01/11/2013 1147   LDLCALC 73 01/11/2013 1147      Wt Readings from Last 3 Encounters:  07/16/14 208 lb 1.9 oz (94.403 kg)  07/12/13 202 lb (91.627 kg)      Other  studies Reviewed: Additional studies/ records that were reviewed today include: Unable to find prior echo reports within the hepatic chart. Review of the above records demonstrates:    ASSESSMENT AND PLAN:  History of mitral valve replacement with mechanical valve: Mechanical mitral valve is crisp closure sounds and no clinical evidence of dysfunction  Paroxysmal atrial fibrillation: No recent recurrences  Coronary artery disease involving coronary bypass graft of native heart without angina pectoris: Stable  Chronic anticoagulation: No complications  Essential hypertension: Stable     Current medicines are reviewed at length with the patient today.  The patient does not have concerns regarding medicines.  The following changes have been made:  no change  Labs/ tests ordered today include:   Orders Placed This Encounter  Procedures  . EKG 12-Lead     Disposition:   FU with HS in 1 year  Signed, Sinclair Grooms, MD  07/16/2014 5:12 PM    [facility] [place], [place], [place]  [address] Phone: [phone]; Fax: [phone]

## 2014-07-16 NOTE — Patient Instructions (Signed)

## 2014-07-25 ENCOUNTER — Telehealth: Payer: Self-pay

## 2014-07-25 ENCOUNTER — Other Ambulatory Visit: Payer: Self-pay | Admitting: Interventional Cardiology

## 2014-07-25 DIAGNOSIS — E785 Hyperlipidemia, unspecified: Secondary | ICD-10-CM

## 2014-07-25 NOTE — Telephone Encounter (Signed)
Could labs have been done at PCP? If he can provide a copy there will be no concern. Otherwise we could wait until fall, but there is no record of labs for a long time. If something is wrong with liver, it will get worse in the interim.

## 2014-07-25 NOTE — Telephone Encounter (Signed)
Called Mario Powell to adv him that he is past due for his fasting lipid and alt. The last lipid in Mario Powell chart was in Oct 2014. asked Mario Powell if he had his labs done somewhere else and he said no. Mario Powell insist he had his labs done at our office in Nov 2015. Adv him we have no record of that. Adv Mario Powell that we have received refill rqst for his cholesterol medications, And we should get labs drawn to ck his cholesterol and liver function. Mario Powell insist that he wants to have it in Oct/Nov like he always had. Adv Mario Powell Dr.Smith might need labs sooner. Adv him I will f/u Dr.Smith a message and call back with his response

## 2014-07-30 NOTE — Telephone Encounter (Signed)
Pt aware of Dr.Smith's response. He has not has fasting lipids drawn with his pcp. He would like to wait for Oct 2016 to have them drawn at our office. Adv pt we will fwd him a reminder letter to call in the fall and schedule  a fasting lab appt. He verbalized understanding.

## 2014-08-07 ENCOUNTER — Ambulatory Visit (INDEPENDENT_AMBULATORY_CARE_PROVIDER_SITE_OTHER): Payer: Medicare Other | Admitting: *Deleted

## 2014-08-07 DIAGNOSIS — Z954 Presence of other heart-valve replacement: Secondary | ICD-10-CM

## 2014-08-07 DIAGNOSIS — Z952 Presence of prosthetic heart valve: Secondary | ICD-10-CM

## 2014-08-07 DIAGNOSIS — I059 Rheumatic mitral valve disease, unspecified: Secondary | ICD-10-CM

## 2014-08-07 LAB — POCT INR: INR: 2.6

## 2014-08-10 ENCOUNTER — Other Ambulatory Visit: Payer: Self-pay | Admitting: Interventional Cardiology

## 2014-08-21 ENCOUNTER — Other Ambulatory Visit: Payer: Self-pay | Admitting: Interventional Cardiology

## 2014-09-11 ENCOUNTER — Ambulatory Visit (INDEPENDENT_AMBULATORY_CARE_PROVIDER_SITE_OTHER): Payer: Medicare Other | Admitting: Pharmacist

## 2014-09-11 DIAGNOSIS — I059 Rheumatic mitral valve disease, unspecified: Secondary | ICD-10-CM

## 2014-09-11 DIAGNOSIS — Z954 Presence of other heart-valve replacement: Secondary | ICD-10-CM | POA: Diagnosis not present

## 2014-09-11 DIAGNOSIS — Z952 Presence of prosthetic heart valve: Secondary | ICD-10-CM

## 2014-09-11 LAB — POCT INR: INR: 4

## 2014-09-22 ENCOUNTER — Other Ambulatory Visit: Payer: Self-pay | Admitting: Interventional Cardiology

## 2014-10-09 ENCOUNTER — Ambulatory Visit (INDEPENDENT_AMBULATORY_CARE_PROVIDER_SITE_OTHER): Payer: Medicare Other | Admitting: *Deleted

## 2014-10-09 DIAGNOSIS — Z952 Presence of prosthetic heart valve: Secondary | ICD-10-CM

## 2014-10-09 DIAGNOSIS — I059 Rheumatic mitral valve disease, unspecified: Secondary | ICD-10-CM | POA: Diagnosis not present

## 2014-10-09 DIAGNOSIS — Z954 Presence of other heart-valve replacement: Secondary | ICD-10-CM

## 2014-10-09 LAB — POCT INR: INR: 2.5

## 2014-10-22 ENCOUNTER — Other Ambulatory Visit: Payer: Self-pay | Admitting: Interventional Cardiology

## 2014-11-06 ENCOUNTER — Ambulatory Visit (INDEPENDENT_AMBULATORY_CARE_PROVIDER_SITE_OTHER): Payer: Medicare Other

## 2014-11-06 DIAGNOSIS — Z952 Presence of prosthetic heart valve: Secondary | ICD-10-CM

## 2014-11-06 DIAGNOSIS — I059 Rheumatic mitral valve disease, unspecified: Secondary | ICD-10-CM

## 2014-11-06 DIAGNOSIS — Z954 Presence of other heart-valve replacement: Secondary | ICD-10-CM

## 2014-11-06 LAB — POCT INR: INR: 3.7

## 2014-12-04 ENCOUNTER — Ambulatory Visit (INDEPENDENT_AMBULATORY_CARE_PROVIDER_SITE_OTHER): Payer: Medicare Other | Admitting: Pharmacist

## 2014-12-04 DIAGNOSIS — Z954 Presence of other heart-valve replacement: Secondary | ICD-10-CM

## 2014-12-04 DIAGNOSIS — I059 Rheumatic mitral valve disease, unspecified: Secondary | ICD-10-CM

## 2014-12-04 DIAGNOSIS — Z952 Presence of prosthetic heart valve: Secondary | ICD-10-CM

## 2014-12-04 LAB — POCT INR: INR: 3.8

## 2014-12-25 ENCOUNTER — Ambulatory Visit (INDEPENDENT_AMBULATORY_CARE_PROVIDER_SITE_OTHER): Payer: Medicare Other | Admitting: Pharmacist

## 2014-12-25 DIAGNOSIS — I059 Rheumatic mitral valve disease, unspecified: Secondary | ICD-10-CM

## 2014-12-25 DIAGNOSIS — Z954 Presence of other heart-valve replacement: Secondary | ICD-10-CM

## 2014-12-25 DIAGNOSIS — Z952 Presence of prosthetic heart valve: Secondary | ICD-10-CM

## 2014-12-25 LAB — POCT INR: INR: 3.1

## 2014-12-27 ENCOUNTER — Other Ambulatory Visit (INDEPENDENT_AMBULATORY_CARE_PROVIDER_SITE_OTHER): Payer: Medicare Other

## 2014-12-27 DIAGNOSIS — I25810 Atherosclerosis of coronary artery bypass graft(s) without angina pectoris: Secondary | ICD-10-CM

## 2014-12-27 DIAGNOSIS — I480 Paroxysmal atrial fibrillation: Secondary | ICD-10-CM

## 2014-12-27 DIAGNOSIS — I10 Essential (primary) hypertension: Secondary | ICD-10-CM

## 2014-12-27 DIAGNOSIS — I4891 Unspecified atrial fibrillation: Secondary | ICD-10-CM | POA: Diagnosis not present

## 2014-12-27 LAB — LIPID PANEL
CHOLESTEROL: 132 mg/dL (ref 125–200)
HDL: 32 mg/dL — ABNORMAL LOW (ref >=40)
LDL Cholesterol: 90 mg/dL (ref <130)
Total CHOL/HDL Ratio: 4.1 Ratio (ref <=5.0)
Triglycerides: 52 mg/dL (ref <150)
VLDL: 10 mg/dL (ref <30)

## 2014-12-27 LAB — ALT: ALT: 36 U/L (ref 9–46)

## 2014-12-27 NOTE — Addendum Note (Signed)
Addended by: Eulis Foster on: 12/27/2014 10:57 AM   Modules accepted: Orders

## 2015-01-01 ENCOUNTER — Other Ambulatory Visit: Payer: Self-pay

## 2015-01-01 DIAGNOSIS — E785 Hyperlipidemia, unspecified: Secondary | ICD-10-CM

## 2015-01-22 ENCOUNTER — Ambulatory Visit (INDEPENDENT_AMBULATORY_CARE_PROVIDER_SITE_OTHER): Payer: Medicare Other | Admitting: *Deleted

## 2015-01-22 DIAGNOSIS — I059 Rheumatic mitral valve disease, unspecified: Secondary | ICD-10-CM | POA: Diagnosis not present

## 2015-01-22 DIAGNOSIS — Z954 Presence of other heart-valve replacement: Secondary | ICD-10-CM | POA: Diagnosis not present

## 2015-01-22 DIAGNOSIS — Z952 Presence of prosthetic heart valve: Secondary | ICD-10-CM

## 2015-01-22 LAB — POCT INR: INR: 2.6

## 2015-01-30 DIAGNOSIS — E559 Vitamin D deficiency, unspecified: Secondary | ICD-10-CM | POA: Diagnosis not present

## 2015-01-30 DIAGNOSIS — C61 Malignant neoplasm of prostate: Secondary | ICD-10-CM | POA: Diagnosis not present

## 2015-02-04 ENCOUNTER — Other Ambulatory Visit: Payer: Self-pay | Admitting: Interventional Cardiology

## 2015-02-06 DIAGNOSIS — E291 Testicular hypofunction: Secondary | ICD-10-CM | POA: Diagnosis not present

## 2015-02-06 DIAGNOSIS — C61 Malignant neoplasm of prostate: Secondary | ICD-10-CM | POA: Diagnosis not present

## 2015-02-06 DIAGNOSIS — E559 Vitamin D deficiency, unspecified: Secondary | ICD-10-CM | POA: Diagnosis not present

## 2015-02-19 ENCOUNTER — Ambulatory Visit (INDEPENDENT_AMBULATORY_CARE_PROVIDER_SITE_OTHER): Payer: Medicare Other

## 2015-02-19 DIAGNOSIS — Z954 Presence of other heart-valve replacement: Secondary | ICD-10-CM | POA: Diagnosis not present

## 2015-02-19 DIAGNOSIS — I059 Rheumatic mitral valve disease, unspecified: Secondary | ICD-10-CM | POA: Diagnosis not present

## 2015-02-19 DIAGNOSIS — Z952 Presence of prosthetic heart valve: Secondary | ICD-10-CM

## 2015-02-19 LAB — POCT INR: INR: 2.5

## 2015-04-02 ENCOUNTER — Ambulatory Visit (INDEPENDENT_AMBULATORY_CARE_PROVIDER_SITE_OTHER): Payer: Medicare Other | Admitting: Pharmacist

## 2015-04-02 ENCOUNTER — Encounter (INDEPENDENT_AMBULATORY_CARE_PROVIDER_SITE_OTHER): Payer: Self-pay

## 2015-04-02 DIAGNOSIS — Z954 Presence of other heart-valve replacement: Secondary | ICD-10-CM

## 2015-04-02 DIAGNOSIS — I059 Rheumatic mitral valve disease, unspecified: Secondary | ICD-10-CM | POA: Diagnosis not present

## 2015-04-02 DIAGNOSIS — Z952 Presence of prosthetic heart valve: Secondary | ICD-10-CM

## 2015-04-02 LAB — POCT INR: INR: 3

## 2015-05-03 ENCOUNTER — Other Ambulatory Visit: Payer: Self-pay | Admitting: Interventional Cardiology

## 2015-05-14 ENCOUNTER — Ambulatory Visit (INDEPENDENT_AMBULATORY_CARE_PROVIDER_SITE_OTHER): Payer: Medicare Other | Admitting: *Deleted

## 2015-05-14 DIAGNOSIS — I059 Rheumatic mitral valve disease, unspecified: Secondary | ICD-10-CM

## 2015-05-14 DIAGNOSIS — Z954 Presence of other heart-valve replacement: Secondary | ICD-10-CM

## 2015-05-14 DIAGNOSIS — Z952 Presence of prosthetic heart valve: Secondary | ICD-10-CM

## 2015-05-14 LAB — POCT INR: INR: 2.9

## 2015-05-17 ENCOUNTER — Other Ambulatory Visit: Payer: Self-pay | Admitting: Interventional Cardiology

## 2015-06-19 ENCOUNTER — Other Ambulatory Visit: Payer: Self-pay | Admitting: Interventional Cardiology

## 2015-06-25 ENCOUNTER — Ambulatory Visit (INDEPENDENT_AMBULATORY_CARE_PROVIDER_SITE_OTHER): Payer: Medicare Other | Admitting: Pharmacist

## 2015-06-25 DIAGNOSIS — Z954 Presence of other heart-valve replacement: Secondary | ICD-10-CM | POA: Diagnosis not present

## 2015-06-25 DIAGNOSIS — I059 Rheumatic mitral valve disease, unspecified: Secondary | ICD-10-CM

## 2015-06-25 DIAGNOSIS — Z952 Presence of prosthetic heart valve: Secondary | ICD-10-CM

## 2015-06-25 LAB — POCT INR: INR: 3.3

## 2015-07-17 ENCOUNTER — Other Ambulatory Visit: Payer: Self-pay | Admitting: Interventional Cardiology

## 2015-07-30 ENCOUNTER — Other Ambulatory Visit: Payer: Self-pay | Admitting: Interventional Cardiology

## 2015-08-06 ENCOUNTER — Ambulatory Visit (INDEPENDENT_AMBULATORY_CARE_PROVIDER_SITE_OTHER): Payer: Medicare Other | Admitting: *Deleted

## 2015-08-06 DIAGNOSIS — I059 Rheumatic mitral valve disease, unspecified: Secondary | ICD-10-CM | POA: Diagnosis not present

## 2015-08-06 DIAGNOSIS — Z954 Presence of other heart-valve replacement: Secondary | ICD-10-CM | POA: Diagnosis not present

## 2015-08-06 DIAGNOSIS — Z952 Presence of prosthetic heart valve: Secondary | ICD-10-CM

## 2015-08-06 LAB — POCT INR: INR: 2.9

## 2015-08-15 ENCOUNTER — Other Ambulatory Visit: Payer: Self-pay | Admitting: Interventional Cardiology

## 2015-09-18 ENCOUNTER — Other Ambulatory Visit: Payer: Self-pay | Admitting: Interventional Cardiology

## 2015-09-18 ENCOUNTER — Ambulatory Visit (INDEPENDENT_AMBULATORY_CARE_PROVIDER_SITE_OTHER): Payer: Medicare Other | Admitting: Pharmacist

## 2015-09-18 DIAGNOSIS — I059 Rheumatic mitral valve disease, unspecified: Secondary | ICD-10-CM | POA: Diagnosis not present

## 2015-09-18 DIAGNOSIS — Z952 Presence of prosthetic heart valve: Secondary | ICD-10-CM

## 2015-09-18 DIAGNOSIS — Z954 Presence of other heart-valve replacement: Secondary | ICD-10-CM

## 2015-09-18 LAB — POCT INR: INR: 3.7

## 2015-10-16 ENCOUNTER — Other Ambulatory Visit: Payer: Self-pay | Admitting: Interventional Cardiology

## 2015-10-16 ENCOUNTER — Ambulatory Visit (INDEPENDENT_AMBULATORY_CARE_PROVIDER_SITE_OTHER): Payer: Medicare Other | Admitting: *Deleted

## 2015-10-16 DIAGNOSIS — Z952 Presence of prosthetic heart valve: Secondary | ICD-10-CM

## 2015-10-16 DIAGNOSIS — Z954 Presence of other heart-valve replacement: Secondary | ICD-10-CM

## 2015-10-16 DIAGNOSIS — I059 Rheumatic mitral valve disease, unspecified: Secondary | ICD-10-CM

## 2015-10-16 LAB — POCT INR: INR: 3.4

## 2015-11-12 ENCOUNTER — Ambulatory Visit (INDEPENDENT_AMBULATORY_CARE_PROVIDER_SITE_OTHER): Payer: Medicare Other | Admitting: Interventional Cardiology

## 2015-11-12 ENCOUNTER — Ambulatory Visit (INDEPENDENT_AMBULATORY_CARE_PROVIDER_SITE_OTHER): Payer: Medicare Other

## 2015-11-12 ENCOUNTER — Encounter: Payer: Self-pay | Admitting: Interventional Cardiology

## 2015-11-12 VITALS — BP 122/70 | HR 76 | Ht 71.0 in | Wt 206.1 lb

## 2015-11-12 DIAGNOSIS — I10 Essential (primary) hypertension: Secondary | ICD-10-CM

## 2015-11-12 DIAGNOSIS — Z7901 Long term (current) use of anticoagulants: Secondary | ICD-10-CM

## 2015-11-12 DIAGNOSIS — Z952 Presence of prosthetic heart valve: Secondary | ICD-10-CM

## 2015-11-12 DIAGNOSIS — Z954 Presence of other heart-valve replacement: Secondary | ICD-10-CM | POA: Diagnosis not present

## 2015-11-12 DIAGNOSIS — I059 Rheumatic mitral valve disease, unspecified: Secondary | ICD-10-CM

## 2015-11-12 DIAGNOSIS — I25812 Atherosclerosis of bypass graft of coronary artery of transplanted heart without angina pectoris: Secondary | ICD-10-CM | POA: Diagnosis not present

## 2015-11-12 DIAGNOSIS — I480 Paroxysmal atrial fibrillation: Secondary | ICD-10-CM

## 2015-11-12 LAB — POCT INR: INR: 2.9

## 2015-11-12 NOTE — Progress Notes (Signed)
Cardiology Office Note    Date:  11/12/2015   ID:  Mario Powell, DOB [birth year], MRN [id number]  PCP:  No PCP Per Patient  Cardiologist: Sinclair Grooms, MD   Chief Complaint  Patient presents with  . Cardiac Valve Problem    History of Present Illness:  Mario Powell is a 70 y.o. male follow-up of coronary artery disease with prior bypass grafting, mechanical mitral valve, prior history of endocarditis, hypertension, chronic left greater than right lower extremity edema, history of paroxysmal atrial fibrillation, and chronic anticoagulation  He remains very active. He denies orthopnea, PND, chest pain. No prolonged palpitations. No blood in the urine or stool.   Past Medical History:  Diagnosis Date  . Atrial fibrillation ([place]) 07/12/2013   Perioperative, 2001   . CAD (coronary artery disease) of artery bypass graft 07/12/2013   Saphenous vein graft 2001   . Chronic anticoagulation 07/12/2013  . Essential hypertension 07/12/2013  . History of mitral valve replacement with mechanical valve 01/11/2013   Mechanical mitral valve 2001  Bacterial endocarditis as the cause   . Prostate cancer ([place]) 07/12/2013    Past Surgical History:  Procedure Laterality Date  . CORONARY ARTERY BYPASS GRAFT     2001  . MITRAL VALVE REPLACEMENT  2001   Mechanical prosthesis    Current Medications: Outpatient Medications Prior to Visit  Medication Sig Dispense Refill  . aspirin 81 MG tablet Take 81 mg by mouth daily.    Marland Kitchen ezetimibe (ZETIA) 10 MG tablet Take 1 tablet (10 mg total) by mouth daily. 30 tablet 0  . hydrochlorothiazide (HYDRODIURIL) 25 MG tablet Take 1 tablet (25 mg total) by mouth daily. 90 tablet 0  . niacin (NIASPAN) 1000 MG CR tablet Take 1 tablet (1,000 mg total) by mouth at bedtime. 30 tablet 0  . ramipril (ALTACE) 10 MG capsule Take 1 capsule (10 mg total) by mouth 2 (two) times daily. 180 capsule 1  . rosuvastatin (CRESTOR) 40 MG tablet Take 1 tablet (40 mg total) by  mouth daily. 30 tablet 0  . warfarin (COUMADIN) 1 MG tablet TAKE AS DIRECTED BY ANTICOAGULATION CLINIC 130 tablet 1  . ramipril (ALTACE) 10 MG capsule Take 1 capsule (10 mg total) by mouth 2 (two) times daily. (Patient not taking: Reported on 11/12/2015) 180 capsule 0   No facility-administered medications prior to visit.      Allergies:   Penicillins   Social History   Social History  . Marital status: Widowed    Spouse name: N/A  . Number of children: N/A  . Years of education: N/A   Social History Main Topics  . Smoking status: Former Research scientist (life sciences)  . Smokeless tobacco: Never Used  . Alcohol use None  . Drug use: Unknown  . Sexual activity: Not Asked   Other Topics Concern  . None   Social History Narrative  . None     Family History:  [name] family history includes Healthy in his mother, sister, and sister; Heart attack in his father.   ROS:   Please see the history of present illness.    Easy bruising. Chronic lower extremity swelling.  All other systems reviewed and are negative.   PHYSICAL EXAM:   VS:  BP 122/70   Pulse 76   Ht 5\' 11"  (1.803 m)   Wt 206 lb 1.9 oz (93.5 kg)   BMI 28.75 kg/m    GEN: Well nourished, well developed, in no acute distress  HEENT: normal  Neck: no JVD, carotid bruits, or masses Cardiac: RRR; no murmurs, rubs, or gallops,no edema. Very crisp mechanical mitral valve closure sounds. S1 is crisp.  Respiratory:  clear to auscultation bilaterally, normal work of breathing GI: soft, nontender, nondistended, + BS MS: no deformity or atrophy  Skin: warm and dry, no rash Neuro:  Alert and Oriented x 3, Strength and sensation are intact Psych: euthymic mood, full affect  Wt Readings from Last 3 Encounters:  11/12/15 206 lb 1.9 oz (93.5 kg)  07/16/14 208 lb 1.9 oz (94.4 kg)  07/12/13 202 lb (91.6 kg)      Studies/Labs Reviewed:   EKG:  EKG  Normal sinus rhythm, short PR interval, nonspecific ST-T wave change.  Recent  Labs: 12/27/2014: ALT 36   Lipid Panel    Component Value Date/Time   CHOL 132 12/27/2014 1057   TRIG 52 12/27/2014 1057   HDL 32 (L) 12/27/2014 1057   CHOLHDL 4.1 12/27/2014 1057   VLDL 10 12/27/2014 1057   [place] 90 12/27/2014 1057    Additional studies/ records that were reviewed today include:  Echocardiogram performed 07/27/2013: ------------------------------------------------------------ LV EF: 50% -  55%  ------------------------------------------------------------ Indications:   424.0 Mitral valve disease.  ------------------------------------------------------------ History:  PMH: Acquired from the patient and from the patient's chart. PMH: CAD. Atrial Fibrillation. Risk factors: Former tobacco use. Hypertension.  ------------------------------------------------------------ Study Conclusions  - Left ventricle: The cavity size was normal. Wall thickness was normal. Systolic function was normal. The estimated ejection fraction was in the range of 50% to 55%. Wall motion was normal; there were no regional wall motion abnormalities. - Mitral valve: A mechanical prosthesis was present and functioning normally. - Left atrium: The atrium was moderately dilated.   ASSESSMENT:    1. History of mitral valve replacement with mechanical valve   2. Chronic anticoagulation   3. Coronary artery disease involving bypass graft of transplanted heart without angina pectoris   4. Essential hypertension   5. Paroxysmal atrial fibrillation (HCC)      PLAN:  In order of problems listed above:  1. Normally functioning mechanical mitral valve placed in 2001. No evidence of dysfunction noted by auscultation or prior imaging studies. The valve is now 72 years old. 2. No bleeding, occasions. 3. Stable without angina pectoris. 4. I advised that he should. Attention to sodium in his diet, especially given lower extremity edema. The edema is likely mostly  related to lymphedema. 5. Currently in sinus rhythm with short PR interval. This is been the case on the last several EKGs.    Medication Adjustments/Labs and Tests Ordered: Current medicines are reviewed at length with the patient today.  Concerns regarding medicines are outlined above.  Medication changes, Labs and Tests ordered today are listed in the Patient Instructions below. Patient Instructions  Medication Instructions:  Your physician recommends that you continue on your current medications as directed. Please refer to the Current Medication list given to you today.   Labwork: None ordered  Testing/Procedures: None ordered  Follow-Up: Your physician wants you to follow-up in 1 year with Dr. Tamala Julian. You will receive a reminder letter in the mail two months in advance. If you don't receive a letter, please call our office to schedule the follow-up appointment.   Any Other Special Instructions Will Be Listed Below (If Applicable).     If you need a refill on your cardiac medications before your next appointment, please call your pharmacy.      Signed, Mallie Mussel  Carlye Grippe, MD  11/12/2015 2:44 PM    [facility] [place], [place], [place]  [address] Phone: [phone]; Fax: [phone]

## 2015-11-12 NOTE — Patient Instructions (Signed)

## 2015-11-13 ENCOUNTER — Other Ambulatory Visit: Payer: Self-pay | Admitting: Interventional Cardiology

## 2015-12-10 ENCOUNTER — Ambulatory Visit (INDEPENDENT_AMBULATORY_CARE_PROVIDER_SITE_OTHER): Payer: Medicare Other | Admitting: *Deleted

## 2015-12-10 DIAGNOSIS — I059 Rheumatic mitral valve disease, unspecified: Secondary | ICD-10-CM

## 2015-12-10 DIAGNOSIS — Z954 Presence of other heart-valve replacement: Secondary | ICD-10-CM | POA: Diagnosis not present

## 2015-12-10 DIAGNOSIS — Z952 Presence of prosthetic heart valve: Secondary | ICD-10-CM

## 2015-12-10 LAB — POCT INR: INR: 2.6

## 2015-12-17 ENCOUNTER — Other Ambulatory Visit: Payer: Self-pay | Admitting: Interventional Cardiology

## 2016-01-14 ENCOUNTER — Ambulatory Visit (INDEPENDENT_AMBULATORY_CARE_PROVIDER_SITE_OTHER): Payer: Medicare Other | Admitting: *Deleted

## 2016-01-14 DIAGNOSIS — Z952 Presence of prosthetic heart valve: Secondary | ICD-10-CM | POA: Diagnosis not present

## 2016-01-14 DIAGNOSIS — I059 Rheumatic mitral valve disease, unspecified: Secondary | ICD-10-CM

## 2016-01-14 LAB — POCT INR: INR: 3.6

## 2016-02-01 ENCOUNTER — Other Ambulatory Visit: Payer: Self-pay | Admitting: Interventional Cardiology

## 2016-02-04 DIAGNOSIS — C61 Malignant neoplasm of prostate: Secondary | ICD-10-CM | POA: Diagnosis not present

## 2016-02-11 ENCOUNTER — Ambulatory Visit (INDEPENDENT_AMBULATORY_CARE_PROVIDER_SITE_OTHER): Payer: Medicare Other | Admitting: Pharmacist

## 2016-02-11 DIAGNOSIS — I25812 Atherosclerosis of bypass graft of coronary artery of transplanted heart without angina pectoris: Secondary | ICD-10-CM

## 2016-02-11 DIAGNOSIS — Z952 Presence of prosthetic heart valve: Secondary | ICD-10-CM | POA: Diagnosis not present

## 2016-02-11 DIAGNOSIS — I059 Rheumatic mitral valve disease, unspecified: Secondary | ICD-10-CM

## 2016-02-11 DIAGNOSIS — C61 Malignant neoplasm of prostate: Secondary | ICD-10-CM | POA: Diagnosis not present

## 2016-02-11 LAB — POCT INR: INR: 2.6

## 2016-03-24 ENCOUNTER — Other Ambulatory Visit: Payer: Self-pay | Admitting: Interventional Cardiology

## 2016-03-24 ENCOUNTER — Ambulatory Visit (INDEPENDENT_AMBULATORY_CARE_PROVIDER_SITE_OTHER): Payer: Medicare Other | Admitting: Pharmacist

## 2016-03-24 DIAGNOSIS — I059 Rheumatic mitral valve disease, unspecified: Secondary | ICD-10-CM | POA: Diagnosis not present

## 2016-03-24 DIAGNOSIS — Z952 Presence of prosthetic heart valve: Secondary | ICD-10-CM | POA: Diagnosis not present

## 2016-03-24 LAB — POCT INR: INR: 2.8

## 2016-03-26 NOTE — Telephone Encounter (Signed)
Rx refill sent to pharmacy. 

## 2016-05-05 ENCOUNTER — Ambulatory Visit (INDEPENDENT_AMBULATORY_CARE_PROVIDER_SITE_OTHER): Payer: Medicare Other | Admitting: *Deleted

## 2016-05-05 DIAGNOSIS — I059 Rheumatic mitral valve disease, unspecified: Secondary | ICD-10-CM | POA: Diagnosis not present

## 2016-05-05 DIAGNOSIS — Z952 Presence of prosthetic heart valve: Secondary | ICD-10-CM

## 2016-05-05 LAB — POCT INR: INR: 4.2

## 2016-06-02 ENCOUNTER — Ambulatory Visit (INDEPENDENT_AMBULATORY_CARE_PROVIDER_SITE_OTHER): Payer: Medicare Other | Admitting: Pharmacist

## 2016-06-02 DIAGNOSIS — Z952 Presence of prosthetic heart valve: Secondary | ICD-10-CM | POA: Diagnosis not present

## 2016-06-02 DIAGNOSIS — I059 Rheumatic mitral valve disease, unspecified: Secondary | ICD-10-CM | POA: Diagnosis not present

## 2016-06-02 LAB — POCT INR: INR: 4.3

## 2016-06-16 ENCOUNTER — Ambulatory Visit (INDEPENDENT_AMBULATORY_CARE_PROVIDER_SITE_OTHER): Payer: Medicare Other | Admitting: *Deleted

## 2016-06-16 DIAGNOSIS — Z952 Presence of prosthetic heart valve: Secondary | ICD-10-CM | POA: Diagnosis not present

## 2016-06-16 DIAGNOSIS — I059 Rheumatic mitral valve disease, unspecified: Secondary | ICD-10-CM | POA: Diagnosis not present

## 2016-06-16 LAB — POCT INR: INR: 2.3

## 2016-07-03 ENCOUNTER — Ambulatory Visit (INDEPENDENT_AMBULATORY_CARE_PROVIDER_SITE_OTHER): Payer: Medicare Other | Admitting: Pharmacist

## 2016-07-03 DIAGNOSIS — Z952 Presence of prosthetic heart valve: Secondary | ICD-10-CM | POA: Diagnosis not present

## 2016-07-03 DIAGNOSIS — I059 Rheumatic mitral valve disease, unspecified: Secondary | ICD-10-CM | POA: Diagnosis not present

## 2016-07-03 LAB — POCT INR: INR: 3.5

## 2016-07-31 ENCOUNTER — Ambulatory Visit (INDEPENDENT_AMBULATORY_CARE_PROVIDER_SITE_OTHER): Payer: Medicare Other | Admitting: *Deleted

## 2016-07-31 DIAGNOSIS — J3489 Other specified disorders of nose and nasal sinuses: Secondary | ICD-10-CM | POA: Diagnosis not present

## 2016-07-31 DIAGNOSIS — I059 Rheumatic mitral valve disease, unspecified: Secondary | ICD-10-CM | POA: Diagnosis not present

## 2016-07-31 DIAGNOSIS — Z952 Presence of prosthetic heart valve: Secondary | ICD-10-CM | POA: Diagnosis not present

## 2016-07-31 DIAGNOSIS — J209 Acute bronchitis, unspecified: Secondary | ICD-10-CM | POA: Diagnosis not present

## 2016-07-31 LAB — POCT INR: INR: 4.3

## 2016-08-01 ENCOUNTER — Other Ambulatory Visit: Payer: Self-pay | Admitting: Interventional Cardiology

## 2016-08-18 ENCOUNTER — Ambulatory Visit (INDEPENDENT_AMBULATORY_CARE_PROVIDER_SITE_OTHER): Payer: Medicare Other

## 2016-08-18 DIAGNOSIS — Z952 Presence of prosthetic heart valve: Secondary | ICD-10-CM | POA: Diagnosis not present

## 2016-08-18 DIAGNOSIS — I059 Rheumatic mitral valve disease, unspecified: Secondary | ICD-10-CM | POA: Diagnosis not present

## 2016-08-18 LAB — POCT INR: INR: 3.7

## 2016-09-08 ENCOUNTER — Ambulatory Visit (INDEPENDENT_AMBULATORY_CARE_PROVIDER_SITE_OTHER): Payer: Medicare Other | Admitting: Pharmacist

## 2016-09-08 DIAGNOSIS — I059 Rheumatic mitral valve disease, unspecified: Secondary | ICD-10-CM | POA: Diagnosis not present

## 2016-09-08 DIAGNOSIS — Z952 Presence of prosthetic heart valve: Secondary | ICD-10-CM

## 2016-09-08 LAB — POCT INR: INR: 3.6

## 2016-09-29 ENCOUNTER — Ambulatory Visit (INDEPENDENT_AMBULATORY_CARE_PROVIDER_SITE_OTHER): Payer: Medicare Other | Admitting: *Deleted

## 2016-09-29 DIAGNOSIS — I059 Rheumatic mitral valve disease, unspecified: Secondary | ICD-10-CM | POA: Diagnosis not present

## 2016-09-29 DIAGNOSIS — Z952 Presence of prosthetic heart valve: Secondary | ICD-10-CM

## 2016-09-29 LAB — POCT INR: INR: 4.9

## 2016-10-14 ENCOUNTER — Ambulatory Visit (INDEPENDENT_AMBULATORY_CARE_PROVIDER_SITE_OTHER): Payer: Medicare Other | Admitting: *Deleted

## 2016-10-14 DIAGNOSIS — Z952 Presence of prosthetic heart valve: Secondary | ICD-10-CM | POA: Diagnosis not present

## 2016-10-14 DIAGNOSIS — I059 Rheumatic mitral valve disease, unspecified: Secondary | ICD-10-CM

## 2016-10-14 LAB — POCT INR: INR: 4.2

## 2016-10-28 ENCOUNTER — Ambulatory Visit (INDEPENDENT_AMBULATORY_CARE_PROVIDER_SITE_OTHER): Payer: Medicare Other | Admitting: *Deleted

## 2016-10-28 DIAGNOSIS — I059 Rheumatic mitral valve disease, unspecified: Secondary | ICD-10-CM

## 2016-10-28 DIAGNOSIS — Z952 Presence of prosthetic heart valve: Secondary | ICD-10-CM

## 2016-10-28 LAB — POCT INR: INR: 2.7

## 2016-11-04 ENCOUNTER — Other Ambulatory Visit: Payer: Self-pay | Admitting: Interventional Cardiology

## 2016-11-07 ENCOUNTER — Other Ambulatory Visit: Payer: Self-pay | Admitting: Interventional Cardiology

## 2016-11-11 ENCOUNTER — Ambulatory Visit (INDEPENDENT_AMBULATORY_CARE_PROVIDER_SITE_OTHER): Payer: Medicare Other | Admitting: *Deleted

## 2016-11-11 DIAGNOSIS — Z952 Presence of prosthetic heart valve: Secondary | ICD-10-CM | POA: Diagnosis not present

## 2016-11-11 DIAGNOSIS — I059 Rheumatic mitral valve disease, unspecified: Secondary | ICD-10-CM | POA: Diagnosis not present

## 2016-11-11 LAB — POCT INR: INR: 2.8

## 2016-11-21 ENCOUNTER — Other Ambulatory Visit: Payer: Self-pay | Admitting: Interventional Cardiology

## 2016-12-02 ENCOUNTER — Ambulatory Visit (INDEPENDENT_AMBULATORY_CARE_PROVIDER_SITE_OTHER): Payer: Medicare Other | Admitting: Pharmacist

## 2016-12-02 DIAGNOSIS — Z952 Presence of prosthetic heart valve: Secondary | ICD-10-CM

## 2016-12-02 DIAGNOSIS — I059 Rheumatic mitral valve disease, unspecified: Secondary | ICD-10-CM | POA: Diagnosis not present

## 2016-12-02 LAB — POCT INR: INR: 2.5

## 2016-12-09 DIAGNOSIS — H903 Sensorineural hearing loss, bilateral: Secondary | ICD-10-CM | POA: Diagnosis not present

## 2016-12-13 ENCOUNTER — Other Ambulatory Visit: Payer: Self-pay | Admitting: Interventional Cardiology

## 2016-12-27 ENCOUNTER — Other Ambulatory Visit: Payer: Self-pay | Admitting: Interventional Cardiology

## 2017-01-08 ENCOUNTER — Ambulatory Visit (INDEPENDENT_AMBULATORY_CARE_PROVIDER_SITE_OTHER): Payer: Medicare Other

## 2017-01-08 ENCOUNTER — Ambulatory Visit (INDEPENDENT_AMBULATORY_CARE_PROVIDER_SITE_OTHER): Payer: Medicare Other | Admitting: Interventional Cardiology

## 2017-01-08 ENCOUNTER — Encounter: Payer: Self-pay | Admitting: Interventional Cardiology

## 2017-01-08 VITALS — BP 144/78 | HR 78 | Ht 71.0 in | Wt 198.0 lb

## 2017-01-08 DIAGNOSIS — Z952 Presence of prosthetic heart valve: Secondary | ICD-10-CM

## 2017-01-08 DIAGNOSIS — I059 Rheumatic mitral valve disease, unspecified: Secondary | ICD-10-CM

## 2017-01-08 DIAGNOSIS — Z7901 Long term (current) use of anticoagulants: Secondary | ICD-10-CM | POA: Diagnosis not present

## 2017-01-08 DIAGNOSIS — I10 Essential (primary) hypertension: Secondary | ICD-10-CM | POA: Diagnosis not present

## 2017-01-08 DIAGNOSIS — I25812 Atherosclerosis of bypass graft of coronary artery of transplanted heart without angina pectoris: Secondary | ICD-10-CM | POA: Diagnosis not present

## 2017-01-08 DIAGNOSIS — I480 Paroxysmal atrial fibrillation: Secondary | ICD-10-CM | POA: Diagnosis not present

## 2017-01-08 LAB — POCT INR: INR: 2.4

## 2017-01-08 NOTE — Progress Notes (Signed)
Cardiology Office Note    Date:  01/08/2017   ID:  Mario Powell, DOB 1946/01/29, MRN [id number]  PCP:  Patient, No Pcp Per  Cardiologist: Sinclair Grooms, MD   Chief Complaint  Patient presents with  . Cardiac Valve Problem  . Atrial Fibrillation    History of Present Illness:  Mario Powell is a 71 y.o. male follow-up of coronary artery disease with prior bypass grafting, mechanical mitral valve, prior history of endocarditis, hypertension, chronic left greater than right lower extremity edema, history of paroxysmal atrial fibrillation, and chronic anticoagulation  Mr. Shores doing well. He has no cardiac complaints. He denies bleeding on Coumadin. He has not had episodes of chest discomfort, palpitation, or syncope. He has chronic lower extremity edema which is not changed.   Past Medical History:  Diagnosis Date  . Atrial fibrillation ([place]) 07/12/2013   Perioperative, 2001   . CAD (coronary artery disease) of artery bypass graft 07/12/2013   Saphenous vein graft 2001   . Chronic anticoagulation 07/12/2013  . Essential hypertension 07/12/2013  . History of mitral valve replacement with mechanical valve 01/11/2013   Mechanical mitral valve 2001  Bacterial endocarditis as the cause   . Prostate cancer ([place]) 07/12/2013    Past Surgical History:  Procedure Laterality Date  . CORONARY ARTERY BYPASS GRAFT     2001  . MITRAL VALVE REPLACEMENT  2001   Mechanical prosthesis    Current Medications: Outpatient Medications Prior to Visit  Medication Sig Dispense Refill  . ALTACE 10 MG capsule TAKE 1 CAPSULE TWICE A DAY (PLEASE CALL [phone] FOR APPOINTMENT FOR MORE REFILLS) 30 capsule 0  . aspirin 81 MG tablet Take 81 mg by mouth daily.    Marland Kitchen ezetimibe (ZETIA) 10 MG tablet Take 1 tablet (10 mg total) by mouth daily. 15 tablet 0  . hydrochlorothiazide (HYDRODIURIL) 25 MG tablet Take 1 tablet (25 mg total) by mouth daily. 15 tablet 0  . niacin (NIASPAN) 1000 MG CR tablet Take  1 tablet (1,000 mg total) by mouth at bedtime. 15 tablet 0  . rosuvastatin (CRESTOR) 40 MG tablet Take 1 tablet (40 mg total) by mouth daily. 15 tablet 0  . warfarin (COUMADIN) 1 MG tablet TAKE AS DIRECTED BY ANTICOAGULATION CLINIC 120 tablet 1   No facility-administered medications prior to visit.      Allergies:   Penicillins   Social History   Social History  . Marital status: Widowed    Spouse name: N/A  . Number of children: N/A  . Years of education: N/A   Social History Main Topics  . Smoking status: Former Research scientist (life sciences)  . Smokeless tobacco: Never Used  . Alcohol use None  . Drug use: Unknown  . Sexual activity: Not Asked   Other Topics Concern  . None   Social History Narrative  . None     Family History:  [name] family history includes Healthy in his mother, sister, and sister; Heart attack in his father.   ROS:   Please see the history of present illness.    Chronic lower extremity swelling. Otherwise no complaints.  All other systems reviewed and are negative.   PHYSICAL EXAM:   VS:  BP (!) 144/78   Pulse 78   Ht 5\' 11"  (1.803 m)   Wt 198 lb (89.8 kg)   BMI 27.62 kg/m    GEN: Well nourished, well developed, in no acute distress  HEENT: normal  Neck: no JVD, carotid bruits,  or masses Cardiac: RRR; no murmurs, rubs, or gallops. Crisp, loud S1. No murmur. 2-3+ bilateral lymphedema  Respiratory:  clear to auscultation bilaterally, normal work of breathing GI: soft, nontender, nondistended, + BS MS: no deformity or atrophy  Skin: warm and dry, no rash Neuro:  Alert and Oriented x 3, Strength and sensation are intact Psych: euthymic mood, full affect  Wt Readings from Last 3 Encounters:  01/08/17 198 lb (89.8 kg)  11/12/15 206 lb 1.9 oz (93.5 kg)  07/16/14 208 lb 1.9 oz (94.4 kg)      Studies/Labs Reviewed:   EKG:  EKG  Normal sinus rhythm with short PR interval. Right axis deviation.when compared to the tracing of 2017, no significant change  has occurred.  Recent Labs: No results found for requested labs within last 8760 hours.   Lipid Panel    Component Value Date/Time   CHOL 132 12/27/2014 1057   TRIG 52 12/27/2014 1057   HDL 32 (L) 12/27/2014 1057   CHOLHDL 4.1 12/27/2014 1057   VLDL 10 12/27/2014 1057   [place] 90 12/27/2014 1057    Additional studies/ records that were reviewed today include:  none    ASSESSMENT:    1. Coronary artery disease involving bypass graft of transplanted heart without angina pectoris   2. Essential hypertension   3. Paroxysmal atrial fibrillation (HCC)   4. Chronic anticoagulation   5. History of mitral valve replacement with mechanical valve      PLAN:  In order of problems listed above:  1. Coronary artery disease, stable without angina pectoris. 2. Blood pressure is borderline control. No change or carotid in therapy at this time. Target 140/90 mmHg or less. 3. No symptomatic recurrences of atrial fibrillation that the patient is aware of. He is anticoagulated with Coumadin 4. No bleeding complications on Coumadin. 5. Normally functioning mechanical mitral valve. Clinical follow-up in one year.  We will do a lipid panel, comprehensive metabolic panel, and CBC. Clinical follow-up in one year.    Medication Adjustments/Labs and Tests Ordered: Current medicines are reviewed at length with the patient today.  Concerns regarding medicines are outlined above.  Medication changes, Labs and Tests ordered today are listed in the Patient Instructions below. There are no Patient Instructions on file for this visit.   Signed, Sinclair Grooms, MD  01/08/2017 4:00 PM    [facility] [place], [place], [place]  [address] Phone: [phone]; Fax: [phone]

## 2017-01-08 NOTE — Patient Instructions (Signed)
Medication Instructions:  Your physician recommends that you continue on your current medications as directed. Please refer to the Current Medication list given to you today.   Labwork: CBC, CMET and Lipid today  Testing/Procedures: None  Follow-Up: Your physician wants you to follow-up in: 1 year with Dr. Tamala Julian.  You will receive a reminder letter in the mail two months in advance. If you don't receive a letter, please call our office to schedule the follow-up appointment.   Any Other Special Instructions Will Be Listed Below (If Applicable).     If you need a refill on your cardiac medications before your next appointment, please call your pharmacy.

## 2017-01-09 ENCOUNTER — Other Ambulatory Visit: Payer: Self-pay | Admitting: Interventional Cardiology

## 2017-01-09 LAB — CBC
Hematocrit: 42.4 % (ref 37.5–51.0)
Hemoglobin: 13.9 g/dL (ref 13.0–17.7)
MCH: 33.2 pg — ABNORMAL HIGH (ref 26.6–33.0)
MCHC: 32.8 g/dL (ref 31.5–35.7)
MCV: 101 fL — ABNORMAL HIGH (ref 79–97)
PLATELETS: 178 10*3/uL (ref 150–379)
RBC: 4.19 x10E6/uL (ref 4.14–5.80)
RDW: 13.2 % (ref 12.3–15.4)
WBC: 3.7 10*3/uL (ref 3.4–10.8)

## 2017-01-09 LAB — LIPID PANEL
Chol/HDL Ratio: 3.2 ratio (ref 0.0–5.0)
Cholesterol, Total: 116 mg/dL (ref 100–199)
HDL: 36 mg/dL — AB (ref >39)
LDL Calculated: 71 mg/dL (ref 0–99)
TRIGLYCERIDES: 46 mg/dL (ref 0–149)
VLDL Cholesterol Cal: 9 mg/dL (ref 5–40)

## 2017-01-09 LAB — COMPREHENSIVE METABOLIC PANEL
A/G RATIO: 1.8 (ref 1.2–2.2)
ALBUMIN: 3.2 g/dL — AB (ref 3.5–4.8)
ALT: 40 IU/L (ref 0–44)
AST: 63 IU/L — ABNORMAL HIGH (ref 0–40)
Alkaline Phosphatase: 60 IU/L (ref 39–117)
BUN / CREAT RATIO: 9 — AB (ref 10–24)
BUN: 11 mg/dL (ref 8–27)
Bilirubin Total: 0.5 mg/dL (ref 0.0–1.2)
CHLORIDE: 95 mmol/L — AB (ref 96–106)
CO2: 27 mmol/L (ref 20–29)
Calcium: 8.4 mg/dL — ABNORMAL LOW (ref 8.6–10.2)
Creatinine, Ser: 1.17 mg/dL (ref 0.76–1.27)
GFR, EST AFRICAN AMERICAN: 71 mL/min/{1.73_m2} (ref >59)
GFR, EST NON AFRICAN AMERICAN: 61 mL/min/{1.73_m2} (ref >59)
Globulin, Total: 1.8 g/dL (ref 1.5–4.5)
Glucose: 131 mg/dL — ABNORMAL HIGH (ref 65–99)
POTASSIUM: 4.7 mmol/L (ref 3.5–5.2)
Sodium: 134 mmol/L (ref 134–144)
Total Protein: 5 g/dL — ABNORMAL LOW (ref 6.0–8.5)

## 2017-01-10 ENCOUNTER — Other Ambulatory Visit: Payer: Self-pay | Admitting: Interventional Cardiology

## 2017-01-16 DIAGNOSIS — Z23 Encounter for immunization: Secondary | ICD-10-CM | POA: Diagnosis not present

## 2017-01-28 ENCOUNTER — Other Ambulatory Visit: Payer: Self-pay | Admitting: Interventional Cardiology

## 2017-02-09 DIAGNOSIS — C61 Malignant neoplasm of prostate: Secondary | ICD-10-CM | POA: Diagnosis not present

## 2017-02-18 DIAGNOSIS — C61 Malignant neoplasm of prostate: Secondary | ICD-10-CM | POA: Diagnosis not present

## 2017-02-18 DIAGNOSIS — R351 Nocturia: Secondary | ICD-10-CM | POA: Diagnosis not present

## 2017-02-19 ENCOUNTER — Ambulatory Visit (INDEPENDENT_AMBULATORY_CARE_PROVIDER_SITE_OTHER): Payer: Medicare Other | Admitting: Pharmacist

## 2017-02-19 DIAGNOSIS — Z952 Presence of prosthetic heart valve: Secondary | ICD-10-CM

## 2017-02-19 LAB — POCT INR: INR: 2.4

## 2017-02-19 NOTE — Patient Instructions (Signed)
Description   Take an extra 1/2 tablet today (since already took 1 tablet) then resume same dosage 1mg  every day.  Recheck in 6 weeks.

## 2017-04-02 ENCOUNTER — Ambulatory Visit (INDEPENDENT_AMBULATORY_CARE_PROVIDER_SITE_OTHER): Payer: Medicare Other | Admitting: *Deleted

## 2017-04-02 DIAGNOSIS — Z5181 Encounter for therapeutic drug level monitoring: Secondary | ICD-10-CM

## 2017-04-02 DIAGNOSIS — I480 Paroxysmal atrial fibrillation: Secondary | ICD-10-CM

## 2017-04-02 DIAGNOSIS — Z952 Presence of prosthetic heart valve: Secondary | ICD-10-CM | POA: Diagnosis not present

## 2017-04-02 LAB — POCT INR: INR: 1.9

## 2017-04-02 NOTE — Patient Instructions (Signed)
Description   Today Jan 18th take another 1/2 tablet making total of  1.5mg  then tomorrow Jan 19th take 1 and 1/2 tablets (1.5mg ) then change coumadin dose to 1 tablet (1mg ) daily except 1and 1/2 tablets (1.5mg ) on Tuesdays   Recheck in 2 weeks.

## 2017-04-16 ENCOUNTER — Ambulatory Visit (INDEPENDENT_AMBULATORY_CARE_PROVIDER_SITE_OTHER): Payer: Medicare Other | Admitting: Pharmacist

## 2017-04-16 DIAGNOSIS — Z952 Presence of prosthetic heart valve: Secondary | ICD-10-CM | POA: Diagnosis not present

## 2017-04-16 DIAGNOSIS — Z5181 Encounter for therapeutic drug level monitoring: Secondary | ICD-10-CM

## 2017-04-16 DIAGNOSIS — I480 Paroxysmal atrial fibrillation: Secondary | ICD-10-CM | POA: Diagnosis not present

## 2017-04-16 LAB — POCT INR: INR: 2.3

## 2017-04-16 NOTE — Patient Instructions (Signed)
Description   Take extra 1/2 tablet today then change dose to coumadin dose to 1 tablet (1mg ) daily except 1and 1/2 tablets (1.5mg ) on Tuesdays and Fridays.   Recheck in 2 weeks.

## 2017-05-05 ENCOUNTER — Ambulatory Visit (INDEPENDENT_AMBULATORY_CARE_PROVIDER_SITE_OTHER): Payer: Medicare Other | Admitting: *Deleted

## 2017-05-05 DIAGNOSIS — Z5181 Encounter for therapeutic drug level monitoring: Secondary | ICD-10-CM

## 2017-05-05 DIAGNOSIS — Z952 Presence of prosthetic heart valve: Secondary | ICD-10-CM

## 2017-05-05 DIAGNOSIS — I480 Paroxysmal atrial fibrillation: Secondary | ICD-10-CM

## 2017-05-05 LAB — POCT INR: INR: 2.7

## 2017-05-05 NOTE — Patient Instructions (Signed)
Description   Continue same  Dose of coumadin  1 tablet (1mg ) daily except 1and 1/2 tablets (1.5mg ) on Tuesdays and Fridays.   Recheck in 3 weeks.

## 2017-05-26 ENCOUNTER — Ambulatory Visit (INDEPENDENT_AMBULATORY_CARE_PROVIDER_SITE_OTHER): Payer: Medicare Other | Admitting: Pharmacist

## 2017-05-26 DIAGNOSIS — Z5181 Encounter for therapeutic drug level monitoring: Secondary | ICD-10-CM | POA: Diagnosis not present

## 2017-05-26 DIAGNOSIS — Z952 Presence of prosthetic heart valve: Secondary | ICD-10-CM

## 2017-05-26 LAB — POCT INR: INR: 2.8

## 2017-05-26 NOTE — Patient Instructions (Signed)
Description   Continue same  dose of coumadin  1 tablet (1mg ) daily except 1and 1/2 tablets (1.5mg ) on Tuesdays and Fridays.   Recheck in 4 weeks.

## 2017-06-30 ENCOUNTER — Ambulatory Visit (INDEPENDENT_AMBULATORY_CARE_PROVIDER_SITE_OTHER): Payer: Medicare Other | Admitting: Pharmacist

## 2017-06-30 DIAGNOSIS — Z5181 Encounter for therapeutic drug level monitoring: Secondary | ICD-10-CM

## 2017-06-30 DIAGNOSIS — Z952 Presence of prosthetic heart valve: Secondary | ICD-10-CM

## 2017-06-30 LAB — POCT INR: INR: 3

## 2017-06-30 NOTE — Patient Instructions (Signed)
Description   Continue same dose of coumadin 1 tablet (1mg ) daily except 1.5 tablets (1.5mg ) on Tuesdays and Fridays.  Recheck in 6 weeks.

## 2017-07-06 ENCOUNTER — Telehealth: Payer: Self-pay | Admitting: Interventional Cardiology

## 2017-07-06 NOTE — Telephone Encounter (Signed)
° °  [facility] Pre-operative Risk Assessment    Request for surgical clearance:  1. What type of surgery is being performed? Nasal Biospy   2. When is this surgery scheduled? Pending  3. What type of clearance is required (medical clearance vs. Pharmacy clearance to hold med vs. Both)? Hold medication 4.  5. Are there any medications that need to be held prior to surgery and how long?Coumandin how many days prior and after  6. Practice name and name of physician performing surgery? [facility]  7. What is your office phone [phone]   7.   What is your office fax [phone]  8.   Anesthesia type (None, local, MAC, general) ? general   Mario Powell 07/06/2017, 10:27 AM  _________________________________________________________________   (provider comments below)

## 2017-07-06 NOTE — Telephone Encounter (Signed)
Patient with diagnosis of Afib with mechanical valve on warfarin for anticoagulation.    Procedure: nasal biopsy Date of procedure: pending  CHADS2-VASc score of  3 (CHF, HTN, AGE, DM2, stroke/tia x 2, CAD, AGE, male)  CrCl 46ml/min Platelet count 178  Per office protocol, patient can hold warfarin for 5 days prior to procedure WITH Lovenox bridging.

## 2017-07-07 NOTE — Telephone Encounter (Signed)
Recommendation faxed to number provided via Epic.

## 2017-07-27 ENCOUNTER — Other Ambulatory Visit: Payer: Self-pay | Admitting: Interventional Cardiology

## 2017-08-11 ENCOUNTER — Ambulatory Visit (INDEPENDENT_AMBULATORY_CARE_PROVIDER_SITE_OTHER): Payer: Medicare Other | Admitting: *Deleted

## 2017-08-11 DIAGNOSIS — I480 Paroxysmal atrial fibrillation: Secondary | ICD-10-CM

## 2017-08-11 DIAGNOSIS — Z952 Presence of prosthetic heart valve: Secondary | ICD-10-CM

## 2017-08-11 DIAGNOSIS — Z5181 Encounter for therapeutic drug level monitoring: Secondary | ICD-10-CM

## 2017-08-11 LAB — POCT INR: INR: 3.6 — AB (ref 2.0–3.0)

## 2017-08-11 NOTE — Patient Instructions (Signed)
Description   Tomorrow May 30th take 1/2 tablet (0.5mg )  as he has taken coumadin today then continue same dose of coumadin 1 tablet (1mg ) daily except 1 and 1/2 tablets (1.5mg ) on Tuesdays and Fridays.  Recheck in 6 weeks.  Return to eating greens as normal Call when procedure is scheduled [phone]

## 2017-09-22 ENCOUNTER — Ambulatory Visit (INDEPENDENT_AMBULATORY_CARE_PROVIDER_SITE_OTHER): Payer: Medicare Other | Admitting: *Deleted

## 2017-09-22 DIAGNOSIS — Z5181 Encounter for therapeutic drug level monitoring: Secondary | ICD-10-CM

## 2017-09-22 DIAGNOSIS — Z952 Presence of prosthetic heart valve: Secondary | ICD-10-CM | POA: Diagnosis not present

## 2017-09-22 LAB — POCT INR: INR: 2.9 (ref 2.0–3.0)

## 2017-09-22 NOTE — Patient Instructions (Signed)
Description   Continue same dose of coumadin 1 tablet (1mg ) daily except 1 and 1/2 tablets (1.5mg ) on Tuesdays and Fridays.  Recheck in 6 weeks.  Return to eating greens as normal Call when procedure is scheduled [phone]

## 2017-11-05 ENCOUNTER — Other Ambulatory Visit: Payer: Self-pay | Admitting: Interventional Cardiology

## 2017-11-05 ENCOUNTER — Ambulatory Visit (INDEPENDENT_AMBULATORY_CARE_PROVIDER_SITE_OTHER): Payer: Medicare Other | Admitting: *Deleted

## 2017-11-05 DIAGNOSIS — Z952 Presence of prosthetic heart valve: Secondary | ICD-10-CM | POA: Diagnosis not present

## 2017-11-05 DIAGNOSIS — Z5181 Encounter for therapeutic drug level monitoring: Secondary | ICD-10-CM | POA: Diagnosis not present

## 2017-11-05 LAB — PROTIME-INR
INR: >10 (ref 0.8–1.2)
INR: 6.73
PROTHROMBIN TIME: 115.3 s — AB (ref 9.1–12.0)
Prothrombin Time: 58.1 seconds — ABNORMAL HIGH (ref 11.4–15.2)

## 2017-11-05 LAB — POCT INR: INR: 8 — AB (ref 2.0–3.0)

## 2017-11-12 ENCOUNTER — Ambulatory Visit (INDEPENDENT_AMBULATORY_CARE_PROVIDER_SITE_OTHER): Payer: Medicare Other

## 2017-11-12 DIAGNOSIS — Z5181 Encounter for therapeutic drug level monitoring: Secondary | ICD-10-CM | POA: Diagnosis not present

## 2017-11-12 DIAGNOSIS — Z952 Presence of prosthetic heart valve: Secondary | ICD-10-CM

## 2017-11-12 LAB — POCT INR: INR: 5.9 — AB (ref 2.0–3.0)

## 2017-11-12 NOTE — Patient Instructions (Signed)
Description   Skip today and tomorrow's dosage of Coumadin, then take 1/2 tablet on Sunday then start taking 1 tablet (1mg ) daily.  Recheck in 1 week. Please resume normal green intake & keep consistent. Call when procedure is scheduled [phone]

## 2017-11-19 ENCOUNTER — Ambulatory Visit (INDEPENDENT_AMBULATORY_CARE_PROVIDER_SITE_OTHER): Payer: Medicare Other | Admitting: *Deleted

## 2017-11-19 DIAGNOSIS — Z952 Presence of prosthetic heart valve: Secondary | ICD-10-CM | POA: Diagnosis not present

## 2017-11-19 DIAGNOSIS — Z5181 Encounter for therapeutic drug level monitoring: Secondary | ICD-10-CM | POA: Diagnosis not present

## 2017-11-19 LAB — POCT INR: INR: 3.2 — AB (ref 2.0–3.0)

## 2017-11-19 NOTE — Patient Instructions (Signed)
Description   Start taking 1 tablet everyday except 1/2 tablet on Mondays and Fridays.  Recheck in 7-10 days.  Please resume normal green intake & keep consistent. Call when procedure is scheduled [phone]

## 2017-11-29 ENCOUNTER — Ambulatory Visit (INDEPENDENT_AMBULATORY_CARE_PROVIDER_SITE_OTHER): Payer: Medicare Other

## 2017-11-29 DIAGNOSIS — Z5181 Encounter for therapeutic drug level monitoring: Secondary | ICD-10-CM

## 2017-11-29 DIAGNOSIS — Z952 Presence of prosthetic heart valve: Secondary | ICD-10-CM | POA: Diagnosis not present

## 2017-11-29 LAB — POCT INR: INR: 3.6 — AB (ref 2.0–3.0)

## 2017-11-29 NOTE — Patient Instructions (Signed)
Description   Take 1/2 tablet today and tomorrow, then resume same dosage 1 tablet everyday except 1/2 tablet on Mondays and Fridays.  Recheck in 2 weeks.  Please resume normal green intake & keep consistent. Call when procedure is scheduled [phone]

## 2017-12-01 ENCOUNTER — Inpatient Hospital Stay (HOSPITAL_COMMUNITY)
Admission: EM | Admit: 2017-12-01 | Discharge: 2017-12-08 | DRG: 682 | Disposition: A | Discharge status: Home / Self Care | Payer: Medicare Other | Attending: Family Medicine | Admitting: Family Medicine

## 2017-12-01 ENCOUNTER — Other Ambulatory Visit (HOSPITAL_COMMUNITY): Payer: Medicare Other

## 2017-12-01 ENCOUNTER — Encounter (HOSPITAL_COMMUNITY): Payer: Self-pay | Admitting: Emergency Medicine

## 2017-12-01 ENCOUNTER — Emergency Department (HOSPITAL_COMMUNITY): Payer: Medicare Other

## 2017-12-01 ENCOUNTER — Other Ambulatory Visit: Payer: Self-pay

## 2017-12-01 ENCOUNTER — Ambulatory Visit (INDEPENDENT_AMBULATORY_CARE_PROVIDER_SITE_OTHER)
Admission: EM | Admit: 2017-12-01 | Discharge: 2017-12-01 | Disposition: A | Discharge status: Home / Self Care | Payer: Medicare Other | Source: Home / Self Care | Attending: Family Medicine | Admitting: Family Medicine

## 2017-12-01 ENCOUNTER — Encounter (HOSPITAL_COMMUNITY): Payer: Self-pay | Admitting: General Practice

## 2017-12-01 ENCOUNTER — Inpatient Hospital Stay (HOSPITAL_COMMUNITY): Payer: Medicare Other

## 2017-12-01 DIAGNOSIS — I898 Other specified noninfective disorders of lymphatic vessels and lymph nodes: Secondary | ICD-10-CM | POA: Diagnosis not present

## 2017-12-01 DIAGNOSIS — Z952 Presence of prosthetic heart valve: Secondary | ICD-10-CM

## 2017-12-01 DIAGNOSIS — R7989 Other specified abnormal findings of blood chemistry: Secondary | ICD-10-CM | POA: Diagnosis present

## 2017-12-01 DIAGNOSIS — K648 Other hemorrhoids: Secondary | ICD-10-CM | POA: Diagnosis present

## 2017-12-01 DIAGNOSIS — J90 Pleural effusion, not elsewhere classified: Secondary | ICD-10-CM | POA: Diagnosis present

## 2017-12-01 DIAGNOSIS — Z8546 Personal history of malignant neoplasm of prostate: Secondary | ICD-10-CM

## 2017-12-01 DIAGNOSIS — E8770 Fluid overload, unspecified: Secondary | ICD-10-CM | POA: Diagnosis present

## 2017-12-01 DIAGNOSIS — E781 Pure hyperglyceridemia: Secondary | ICD-10-CM | POA: Diagnosis present

## 2017-12-01 DIAGNOSIS — K8020 Calculus of gallbladder without cholecystitis without obstruction: Secondary | ICD-10-CM | POA: Diagnosis present

## 2017-12-01 DIAGNOSIS — H9190 Unspecified hearing loss, unspecified ear: Secondary | ICD-10-CM | POA: Diagnosis present

## 2017-12-01 DIAGNOSIS — K2970 Gastritis, unspecified, without bleeding: Secondary | ICD-10-CM | POA: Diagnosis not present

## 2017-12-01 DIAGNOSIS — E871 Hypo-osmolality and hyponatremia: Secondary | ICD-10-CM | POA: Diagnosis not present

## 2017-12-01 DIAGNOSIS — R635 Abnormal weight gain: Secondary | ICD-10-CM | POA: Diagnosis not present

## 2017-12-01 DIAGNOSIS — R809 Proteinuria, unspecified: Secondary | ICD-10-CM | POA: Diagnosis present

## 2017-12-01 DIAGNOSIS — Z974 Presence of external hearing-aid: Secondary | ICD-10-CM | POA: Diagnosis not present

## 2017-12-01 DIAGNOSIS — K761 Chronic passive congestion of liver: Secondary | ICD-10-CM | POA: Diagnosis present

## 2017-12-01 DIAGNOSIS — Z880 Allergy status to penicillin: Secondary | ICD-10-CM | POA: Diagnosis not present

## 2017-12-01 DIAGNOSIS — I878 Other specified disorders of veins: Secondary | ICD-10-CM | POA: Diagnosis present

## 2017-12-01 DIAGNOSIS — R601 Generalized edema: Secondary | ICD-10-CM | POA: Diagnosis present

## 2017-12-01 DIAGNOSIS — I10 Essential (primary) hypertension: Secondary | ICD-10-CM | POA: Diagnosis present

## 2017-12-01 DIAGNOSIS — Z79899 Other long term (current) drug therapy: Secondary | ICD-10-CM

## 2017-12-01 DIAGNOSIS — R1900 Intra-abdominal and pelvic swelling, mass and lump, unspecified site: Secondary | ICD-10-CM

## 2017-12-01 DIAGNOSIS — I2510 Atherosclerotic heart disease of native coronary artery without angina pectoris: Secondary | ICD-10-CM | POA: Diagnosis present

## 2017-12-01 DIAGNOSIS — Z6827 Body mass index (BMI) 27.0-27.9, adult: Secondary | ICD-10-CM | POA: Diagnosis not present

## 2017-12-01 DIAGNOSIS — R600 Localized edema: Secondary | ICD-10-CM | POA: Diagnosis not present

## 2017-12-01 DIAGNOSIS — N5089 Other specified disorders of the male genital organs: Secondary | ICD-10-CM | POA: Diagnosis present

## 2017-12-01 DIAGNOSIS — E46 Unspecified protein-calorie malnutrition: Secondary | ICD-10-CM | POA: Diagnosis present

## 2017-12-01 DIAGNOSIS — N289 Disorder of kidney and ureter, unspecified: Secondary | ICD-10-CM

## 2017-12-01 DIAGNOSIS — M1120 Other chondrocalcinosis, unspecified site: Secondary | ICD-10-CM | POA: Diagnosis present

## 2017-12-01 DIAGNOSIS — Z8249 Family history of ischemic heart disease and other diseases of the circulatory system: Secondary | ICD-10-CM

## 2017-12-01 DIAGNOSIS — N179 Acute kidney failure, unspecified: Secondary | ICD-10-CM | POA: Diagnosis present

## 2017-12-01 DIAGNOSIS — D509 Iron deficiency anemia, unspecified: Secondary | ICD-10-CM | POA: Diagnosis present

## 2017-12-01 DIAGNOSIS — K2951 Unspecified chronic gastritis with bleeding: Secondary | ICD-10-CM | POA: Diagnosis present

## 2017-12-01 DIAGNOSIS — K7460 Unspecified cirrhosis of liver: Secondary | ICD-10-CM | POA: Diagnosis not present

## 2017-12-01 DIAGNOSIS — E43 Unspecified severe protein-calorie malnutrition: Secondary | ICD-10-CM

## 2017-12-01 DIAGNOSIS — R011 Cardiac murmur, unspecified: Secondary | ICD-10-CM | POA: Diagnosis present

## 2017-12-01 DIAGNOSIS — Z7982 Long term (current) use of aspirin: Secondary | ICD-10-CM

## 2017-12-01 DIAGNOSIS — E785 Hyperlipidemia, unspecified: Secondary | ICD-10-CM | POA: Diagnosis not present

## 2017-12-01 DIAGNOSIS — D61818 Other pancytopenia: Secondary | ICD-10-CM | POA: Diagnosis present

## 2017-12-01 DIAGNOSIS — K621 Rectal polyp: Secondary | ICD-10-CM | POA: Diagnosis present

## 2017-12-01 DIAGNOSIS — L98499 Non-pressure chronic ulcer of skin of other sites with unspecified severity: Secondary | ICD-10-CM | POA: Diagnosis present

## 2017-12-01 DIAGNOSIS — Z951 Presence of aortocoronary bypass graft: Secondary | ICD-10-CM | POA: Diagnosis not present

## 2017-12-01 DIAGNOSIS — K317 Polyp of stomach and duodenum: Secondary | ICD-10-CM | POA: Diagnosis not present

## 2017-12-01 DIAGNOSIS — Z923 Personal history of irradiation: Secondary | ICD-10-CM | POA: Diagnosis not present

## 2017-12-01 DIAGNOSIS — I480 Paroxysmal atrial fibrillation: Secondary | ICD-10-CM | POA: Diagnosis present

## 2017-12-01 DIAGNOSIS — I890 Lymphedema, not elsewhere classified: Secondary | ICD-10-CM | POA: Diagnosis not present

## 2017-12-01 DIAGNOSIS — Z87891 Personal history of nicotine dependence: Secondary | ICD-10-CM

## 2017-12-01 DIAGNOSIS — R188 Other ascites: Secondary | ICD-10-CM

## 2017-12-01 DIAGNOSIS — R791 Abnormal coagulation profile: Secondary | ICD-10-CM | POA: Diagnosis not present

## 2017-12-01 DIAGNOSIS — Z86711 Personal history of pulmonary embolism: Secondary | ICD-10-CM

## 2017-12-01 DIAGNOSIS — Z7901 Long term (current) use of anticoagulants: Secondary | ICD-10-CM | POA: Diagnosis not present

## 2017-12-01 DIAGNOSIS — R609 Edema, unspecified: Secondary | ICD-10-CM

## 2017-12-01 HISTORY — DX: Generalized edema: R60.1

## 2017-12-01 HISTORY — DX: Unspecified hearing loss, unspecified ear: H91.90

## 2017-12-01 LAB — CBC
HCT: 37.8 % — ABNORMAL LOW (ref 39.0–52.0)
HEMOGLOBIN: 12.9 g/dL — AB (ref 13.0–17.0)
MCH: 34.1 pg — AB (ref 26.0–34.0)
MCHC: 34.1 g/dL (ref 30.0–36.0)
MCV: 100 fL (ref 78.0–100.0)
Platelets: 193 10*3/uL (ref 150–400)
RBC: 3.78 MIL/uL — AB (ref 4.22–5.81)
RDW: 13.4 % (ref 11.5–15.5)
WBC: 3.7 10*3/uL — ABNORMAL LOW (ref 4.0–10.5)

## 2017-12-01 LAB — BASIC METABOLIC PANEL
ANION GAP: 11 (ref 5–15)
BUN: 33 mg/dL — ABNORMAL HIGH (ref 8–23)
CALCIUM: 8.9 mg/dL (ref 8.9–10.3)
CHLORIDE: 99 mmol/L (ref 98–111)
CO2: 21 mmol/L — AB (ref 22–32)
CREATININE: 3.12 mg/dL — AB (ref 0.61–1.24)
GFR calc Af Amer: 21 mL/min — ABNORMAL LOW (ref >60)
GFR calc non Af Amer: 18 mL/min — ABNORMAL LOW (ref >60)
Glucose, Bld: 127 mg/dL — ABNORMAL HIGH (ref 70–99)
Potassium: 4.7 mmol/L (ref 3.5–5.1)
Sodium: 131 mmol/L — ABNORMAL LOW (ref 135–145)

## 2017-12-01 LAB — PROTEIN / CREATININE RATIO, URINE
Creatinine, Urine: 224.75 mg/dL
PROTEIN CREATININE RATIO: 0.25 mg/mg{creat} — AB (ref 0.00–0.15)
TOTAL PROTEIN, URINE: 56 mg/dL

## 2017-12-01 LAB — PROTIME-INR
INR: 3.87
Prothrombin Time: 37.7 seconds — ABNORMAL HIGH (ref 11.4–15.2)

## 2017-12-01 LAB — URINALYSIS, ROUTINE W REFLEX MICROSCOPIC
BILIRUBIN URINE: NEGATIVE
Glucose, UA: NEGATIVE mg/dL
HGB URINE DIPSTICK: NEGATIVE
KETONES UR: NEGATIVE mg/dL
Leukocytes, UA: NEGATIVE
NITRITE: NEGATIVE
PROTEIN: NEGATIVE mg/dL
Specific Gravity, Urine: 1.018 (ref 1.005–1.030)
pH: 5 (ref 5.0–8.0)

## 2017-12-01 LAB — HEPATIC FUNCTION PANEL
ALBUMIN: 2.7 g/dL — AB (ref 3.5–5.0)
ALT: 58 U/L — ABNORMAL HIGH (ref 0–44)
AST: 86 U/L — AB (ref 15–41)
Alkaline Phosphatase: 80 U/L (ref 38–126)
BILIRUBIN DIRECT: 0.2 mg/dL (ref 0.0–0.2)
Indirect Bilirubin: 0.5 mg/dL (ref 0.3–0.9)
Total Bilirubin: 0.7 mg/dL (ref 0.3–1.2)
Total Protein: 5.7 g/dL — ABNORMAL LOW (ref 6.5–8.1)

## 2017-12-01 LAB — I-STAT TROPONIN, ED: TROPONIN I, POC: 0.02 ng/mL (ref 0.00–0.08)

## 2017-12-01 LAB — SODIUM, URINE, RANDOM: SODIUM UR: 36 mmol/L

## 2017-12-01 LAB — BRAIN NATRIURETIC PEPTIDE: B NATRIURETIC PEPTIDE 5: 45.1 pg/mL (ref 0.0–100.0)

## 2017-12-01 LAB — CREATININE, URINE, RANDOM: CREATININE, URINE: 233.86 mg/dL

## 2017-12-01 LAB — TSH: TSH: 9.619 u[IU]/mL — AB (ref 0.350–4.500)

## 2017-12-01 MED ORDER — HEPARIN SODIUM (PORCINE) 5000 UNIT/ML IJ SOLN: 5000.0000 [IU] | Freq: Three times a day (TID) | INTRAMUSCULAR | Status: DC

## 2017-12-01 MED ORDER — POLYETHYLENE GLYCOL 3350 17 G PO PACK: 17.0000 g | PACK | Freq: Every day | ORAL | Status: DC | PRN

## 2017-12-01 MED ORDER — WARFARIN - PHARMACIST DOSING INPATIENT: Freq: Every day | Status: DC

## 2017-12-01 NOTE — ED Provider Notes (Signed)
[place]   [id number] 12/01/17 Arrival Time: 0906  CC: ABDOMINAL swelling  SUBJECTIVE:  Mario Powell is a 72 y.o. male hx significant for mitral valve replacement, CAD, HTN, prostate cancer, chronic anticoagulation, atrial fibrillation for who presents with complaint of abdominal swelling that began 3- 4 weeks ago.  States he was not paying attention to what he has been eating recently.  Has made diet changes without relief.  Tried pepto bismuld without relief.  Denies similar symptoms in the past.  Reports weight gain of approximately 10lbs over the past month, peripheral edema, testicular swelling, and uses multiple pillows at night.    Denies fever, chills, nausea, vomiting, chest pain, SOB, PND, dysuria, difficulty urinating, increased frequency or urgency, loss of bowel or bladder function.  Reports hx of PE x 20 years, and had stent placed in right leg.  States he is on HCTZ for water retention  Recent INR on 11/29/17 of 3.6.  No LMP for male patient.  ROS: As per HPI.  Past Medical History:  Diagnosis Date  . Atrial fibrillation ([place]) 07/12/2013   Perioperative, 2001   . CAD (coronary artery disease) of artery bypass graft 07/12/2013   Saphenous vein graft 2001   . Chronic anticoagulation 07/12/2013  . Essential hypertension 07/12/2013  . History of mitral valve replacement with mechanical valve 01/11/2013   Mechanical mitral valve 2001  Bacterial endocarditis as the cause   . Prostate cancer ([place]) 07/12/2013   Past Surgical History:  Procedure Laterality Date  . CORONARY ARTERY BYPASS GRAFT     2001  . MITRAL VALVE REPLACEMENT  2001   Mechanical prosthesis   Allergies  Allergen Reactions  . Penicillins Other (See Comments)    REACTION: UNKNOWN   No current facility-administered medications on file prior to encounter.    Current Outpatient Medications on File Prior to Encounter  Medication Sig Dispense Refill  . aspirin 81 MG tablet Take 81 mg by mouth  daily.    Marland Kitchen ezetimibe (ZETIA) 10 MG tablet Take 1 tablet (10 mg total) by mouth daily. 90 tablet 3  . hydrochlorothiazide (HYDRODIURIL) 25 MG tablet Take 1 tablet (25 mg total) by mouth daily. 90 tablet 3  . niacin (NIASPAN) 1000 MG CR tablet Take 1 tablet (1,000 mg total) by mouth at bedtime. 90 tablet 3  . ramipril (ALTACE) 10 MG capsule Take 1 capsule (10 mg total) by mouth 2 (two) times daily. 180 capsule 3  . rosuvastatin (CRESTOR) 40 MG tablet Take 1 tablet (40 mg total) by mouth daily. 90 tablet 3  . warfarin (COUMADIN) 1 MG tablet TAKE AS DIRECTED BY ANTICOAGULATION CLINIC 110 tablet 1   Social History   Socioeconomic History  . Marital status: Widowed    Spouse name: Not on file  . Number of children: Not on file  . Years of education: Not on file  . Highest education level: Not on file  Occupational History  . Not on file  Social Needs  . Financial resource strain: Not on file  . Food insecurity:    Worry: Not on file    Inability: Not on file  . Transportation needs:    Medical: Not on file    Non-medical: Not on file  Tobacco Use  . Smoking status: Former Research scientist (life sciences)  . Smokeless tobacco: Never Used  Substance and Sexual Activity  . Alcohol use: Not on file  . Drug use: Not on file  . Sexual activity: Not on file  Lifestyle  .  Physical activity:    Days per week: Not on file    Minutes per session: Not on file  . Stress: Not on file  Relationships  . Social connections:    Talks on phone: Not on file    Gets together: Not on file    Attends religious service: Not on file    Active member of club or organization: Not on file    Attends meetings of clubs or organizations: Not on file    Relationship status: Not on file  . Intimate partner violence:    Fear of current or ex partner: Not on file    Emotionally abused: Not on file    Physically abused: Not on file    Forced sexual activity: Not on file  Other Topics Concern  . Not on file  Social History  Narrative  . Not on file   Family History  Problem Relation Age of Onset  . Heart attack Father   . Healthy Mother   . Healthy Sister   . Healthy Sister      OBJECTIVE:  Vitals:   12/01/17 0937  BP: 137/76  Pulse: 90  Temp: 97.6 F (36.4 C)  TempSrc: Oral  SpO2: 100%    General appearance: Alert; appears chronically ill HEENT: NCAT.   Lungs: clear to auscultation bilaterally without adventitious breath sounds Heart: Artifical hard sounds Abdomen: soft, distended; normal active bowel sounds; non-tender to light and deep palpation; nontender at McBurney's point; no guarding; + fluid wave test Extremities:  Significant 4+ pitting edema to the lower extremities that extends past mid-tibia.   Skin: warm and dry Neurologic: normal gait Psychological: poor historian; anxious/irritated mood  EKG:  Nonspecific t-wave changes.  EKG normal sinus rhythm without ST elevations, depressions, or prolonged PR interval.  No narrowing or widening of the QRS complexes.  Reviewed EKG with Dr. Mannie Stabile.     ASSESSMENT & PLAN:  1. Peripheral edema   2. Abdominal swelling   3. Weight gain     No orders of the defined types were placed in this encounter.  Recommending further evaluation and management in the ED Patient aware and in agreement with this plan  Reviewed expectations re: course of current medical issues. Questions answered. Outlined signs and symptoms indicating need for more acute intervention. Patient verbalized understanding. After Visit Summary given.   Lestine Box, PA-C 12/01/17 [phone]

## 2017-12-01 NOTE — ED Provider Notes (Signed)
[facility] EMERGENCY DEPARTMENT Provider Note   CSN: [id number] Arrival date & time: 12/01/17  1030     History   Chief Complaint Chief Complaint  Patient presents with  . Leg Swelling    HPI Mario Powell is a 73 y.o. male.  HPI Patient presents with swelling in his legs.  Has been going for over the last month.  Although he has had swelling for 20 years states it is worse now.  Has gained around 10 pounds but is been able to get a couple pounds off of it.  Went to see urgent care today and got sent in for further evaluation.  Has some shortness of breath when he lays flat.  He has Dr. Tamala Julian his cardiologist.  He is on Coumadin for mechanical heart valve.  No chest pain.  He has had some diarrhea over the last 3 days. Past Medical History:  Diagnosis Date  . Atrial fibrillation ([place]) 07/12/2013   Perioperative, 2001   . CAD (coronary artery disease) of artery bypass graft 07/12/2013   Saphenous vein graft 2001   . Chronic anticoagulation 07/12/2013  . Essential hypertension 07/12/2013  . History of mitral valve replacement with mechanical valve 01/11/2013   Mechanical mitral valve 2001  Bacterial endocarditis as the cause   . Prostate cancer ([place]) 07/12/2013    Patient Active Problem List   Diagnosis Date Noted  . Encounter for therapeutic drug monitoring 04/16/2017  . CAD (coronary artery disease) of artery bypass graft 07/12/2013  . Essential hypertension 07/12/2013  . Prostate cancer ([place]) 07/12/2013  . Chronic anticoagulation 07/12/2013  . Paroxysmal atrial fibrillation ([place]) 07/12/2013  . History of mitral valve replacement with mechanical valve 01/11/2013    Past Surgical History:  Procedure Laterality Date  . CORONARY ARTERY BYPASS GRAFT     2001  . MITRAL VALVE REPLACEMENT  2001   Mechanical prosthesis        Home Medications    Prior to Admission medications   Medication Sig Start Date End Date Taking? Authorizing Provider  aspirin  81 MG tablet Take 81 mg by mouth daily.    [provider]  ezetimibe (ZETIA) 10 MG tablet Take 1 tablet (10 mg total) by mouth daily. 01/11/17   Belva Crome, MD  hydrochlorothiazide (HYDRODIURIL) 25 MG tablet Take 1 tablet (25 mg total) by mouth daily. 01/11/17   Belva Crome, MD  niacin (NIASPAN) 1000 MG CR tablet Take 1 tablet (1,000 mg total) by mouth at bedtime. 01/11/17   Belva Crome, MD  ramipril (ALTACE) 10 MG capsule Take 1 capsule (10 mg total) by mouth 2 (two) times daily. 01/11/17   Belva Crome, MD  rosuvastatin (CRESTOR) 40 MG tablet Take 1 tablet (40 mg total) by mouth daily. 01/11/17   Belva Crome, MD  warfarin (COUMADIN) 1 MG tablet TAKE AS DIRECTED BY ANTICOAGULATION CLINIC 07/27/17   Belva Crome, MD    Family History Family History  Problem Relation Age of Onset  . Heart attack Father   . Healthy Mother   . Healthy Sister   . Healthy Sister     Social History Social History   Tobacco Use  . Smoking status: Former Research scientist (life sciences)  . Smokeless tobacco: Never Used  Substance Use Topics  . Alcohol use: Not on file  . Drug use: Not on file     Allergies   Penicillins   Review of Systems Review of Systems  Constitutional: Negative  for appetite change and fever.  HENT: Negative for congestion.   Respiratory: Positive for shortness of breath. Negative for chest tightness.   Cardiovascular: Positive for leg swelling.  Gastrointestinal: Positive for abdominal distention and diarrhea.  Genitourinary:       Scrotum is swollen.  Musculoskeletal: Negative for back pain.  Neurological: Negative for weakness.     Physical Exam Updated Vital Signs BP 135/85   Pulse 93   Temp (!) 97.2 F (36.2 C) (Axillary)   Resp 16   SpO2 98%   Physical Exam  Constitutional: He appears well-developed.  Anasarca.  HENT:  Head: Atraumatic.  Eyes: EOM are normal.  Neck: Neck supple.  Cardiovascular: Normal rate.  Pulmonary/Chest:  Some rales bilateral  bases.  Abdominal: He exhibits distension.  Anasarca up to abdomen.  Musculoskeletal: He exhibits edema.  Severe pitting edema bilateral lower extremities and up to thighs.  Some scrotal involvement.  Also involves his hands and forearms.  Neurological: He is alert.  Skin: Skin is warm. Capillary refill takes less than 2 seconds.     ED Treatments / Results  Labs (all labs ordered are listed, but only abnormal results are displayed) Labs Reviewed  BASIC METABOLIC PANEL - Abnormal; Notable for the following components:      Result Value   Sodium 131 (*)    CO2 21 (*)    Glucose, Bld 127 (*)    BUN 33 (*)    Creatinine, Ser 3.12 (*)    GFR calc non Af Amer 18 (*)    GFR calc Af Amer 21 (*)    All other components within normal limits  CBC - Abnormal; Notable for the following components:   WBC 3.7 (*)    RBC 3.78 (*)    Hemoglobin 12.9 (*)    HCT 37.8 (*)    MCH 34.1 (*)    All other components within normal limits  PROTIME-INR - Abnormal; Notable for the following components:   Prothrombin Time 37.7 (*)    All other components within normal limits  HEPATIC FUNCTION PANEL - Abnormal; Notable for the following components:   Total Protein 5.7 (*)    Albumin 2.7 (*)    AST 86 (*)    ALT 58 (*)    All other components within normal limits  BRAIN NATRIURETIC PEPTIDE  I-STAT TROPONIN, ED    EKG None  Radiology Dg Chest 2 View  Result Date: 12/01/2017 CLINICAL DATA:  Bilateral lower extremity swelling x1 month. SOB when supine. Pt denies chest pain. Hx of HTN, CAD, A-fib, Mitral valve replacement. Ex-smoker, quit in 1986. EXAM: CHEST - 2 VIEW COMPARISON:  None. FINDINGS: Sternotomy wires overlie normal cardiac silhouette. Moderate LEFT effusions present. Small RIGHT effusion. Lungs are clear. No osseous abnormality. IMPRESSION: Bilateral effusions, LEFT greater than RIGHT. No comparison available. Electronically Signed   By: Suzy Bouchard M.D.   On: 12/01/2017 11:24     Procedures Procedures (including critical care time)  Medications Ordered in ED Medications - No data to display   Initial Impression / Assessment and Plan / ED Course  I have reviewed the triage vital signs and the nursing notes.  Pertinent labs & imaging results that were available during my care of the patient were reviewed by me and considered in my medical decision making (see chart for details).     Patient with worsening swelling.  Anasarca.  Creatinine has gone up to 3 from normal around a year ago.  Does not  have a primary care doctor but sees Dr. Tamala Julian from cardiology.  Will admit to unassigned.  EKG from urgent care reviewed.  Final Clinical Impressions(s) / ED Diagnoses   Final diagnoses:  Anasarca  Renal insufficiency    ED Discharge Orders    None       Davonna Belling, MD 12/01/17 1306

## 2017-12-01 NOTE — ED Notes (Signed)
Med rec printed and sent to main lab for add on

## 2017-12-01 NOTE — Progress Notes (Signed)
ANTICOAGULATION CONSULT NOTE - Initial Consult  Pharmacy Consult for warfarin Indication: mechanical MVR  Allergies  Allergen Reactions  . Penicillins Other (See Comments)    REACTION: UNKNOWN    Patient Measurements:    Vital Signs: Temp: 97.2 F (36.2 C) (09/18 1040) Temp Source: Axillary (09/18 1040) BP: 134/75 (09/18 1415) Pulse Rate: 98 (09/18 1415)  Labs: Recent Labs    11/29/17 1334 12/01/17 1104  HGB  --  12.9*  HCT  --  37.8*  PLT  --  193  LABPROT  --  37.7*  INR 3.6* 3.87  CREATININE  --  3.12*    CrCl cannot be calculated (Unknown ideal weight.).   Medical History: Past Medical History:  Diagnosis Date  . Atrial fibrillation ([place]) 07/12/2013   Perioperative, 2001   . CAD (coronary artery disease) of artery bypass graft 07/12/2013   Saphenous vein graft 2001   . Chronic anticoagulation 07/12/2013  . Essential hypertension 07/12/2013  . History of mitral valve replacement with mechanical valve 01/11/2013   Mechanical mitral valve 2001  Bacterial endocarditis as the cause   . Prostate cancer ([place]) 07/12/2013   Assessment: 57 yom presented to the ED with leg swelling. He is on chronic warfarin for history of mechanical MVR. INR is supratherapeutic at 3.87. Hgb 12.9 and platelets are WNL. No bleeding noted. Pt already took his dose today.   Goal of Therapy:  INR 2.5-3.5 Monitor platelets by anticoagulation protocol: Yes   Plan:  No further warfarin today Daily INR  Mario Powell, Mario Powell 12/01/2017,2:50 PM

## 2017-12-01 NOTE — Discharge Instructions (Addendum)
Recommending further evaluation and management in the ED Patient aware and in agreement with this plan

## 2017-12-01 NOTE — H&P (Signed)
Mario Powell Admission History and Physical Service Pager: [phone]  Patient name: Mario Powell Medical record number: [id number] Date of birth: December 31, 1945 Age: 72 y.o. Gender: male  Primary Care Provider: Patient, No Pcp Per  Consultants: nephrology  Code Status: Full Code   Chief Complaint: worsening leg swelling   Assessment and Plan: Mario Powell is a 72 y.o. male presenting with progressive increased leg swelling for 4 weeks .   PMH is significant for a.fib on Coumadin, CAD, HTN, MV replacement, prostate cancer.   Diffuse Anasarca: Likely 2/2 AKI intrinsic injury. Albumin 2.7. Patient reports chronic lower extremity edema that acutely worsened over the past 4 weeks. Exam is significant for 3+ pitting edema in bilaterally lower extremities, bilateral hips and extended into lower abdomen which is more likely to be extended edema into abdomen rather than ascites  Pulmonary exam noted for basal crackles and no increased work of breathing. CXR showing bilateral effusions L>R.  Differential includes nephrotic syndrome in setting of AKI, failed diuretic therapy, cirrhosis or acute hepatitis. CHF unlikely as BNP 45 but with history of CAD and MV replacement would need further investigation to rule out cardiogenic cause, last ECHO in 2015 with EF 50-55% and normally function prosthetic valve. -admit to inpatient, med surg. Attending Dr Mario Powell -hold home medications in setting of elevated transaminases and AKI  -Hbg A1c  -TSH  -PT/OT -ECHO  -will not start Lasix for diuresis in setting AKI and without reported pulmonary symptoms  - see below for additional workup   AKI: Previous Cr of 1.17 in 12/2016 but with Cr elevated to 3.  On exam patient does not appear dehydrated so pre-renal cause unlikely. Most likely intrarenal cause given no obstructive symptoms as patient denies any urinary problems despite prostate cancer history. Patient denies heavy NSAID use  outside of occasional colchicine use 3-4x per year for 4 days BID at a time. He recently had an episode of pseudogout for which he took the colchicine for 4 days that ended on the day prior to admission. He denies any other use of NSAIDS. He also reports taking diuretic as prescribed and has no new medications. Other possible causes include nephrotic syndrome with low albumin level and increased edema, hepatorenal syndrome with AKI in setting of transaminitis, synergistic renally toxic medications given history of colchicine use.  -measure urine sodium, Cr, BUN for FEUrea as FENa unreliable due to home HCTZ - urine Protein/Cr ratio -U/A -renal ultrasound   Supratherapeutic INR (Goal 2.5-3.5): Patient on Coumadin with recent elevation of INR to 6 four weeks ago for which his provider adjusted his dosage. INR remains elevated at 3.8 today.  -will hold hepatotoxic agents  -Pharmacy consult for dosing management of Warfarin  -repeat INR  -CBC    Transaminitis: Elevated AST 86 and ALT 56. Likely hepatorenal -trend LFTs -hepatitis panel -holding hepatotoxic agents   Atrial Fibrillation on Coumadin: History of atrial fibrillation, patient on Coumadin and currently supratherapeutic at 3.8.  - continue coumadin per pharmacy  CAD: Patient on ASA 81mg  for secondary prevention due to CAD and MV replacement.  - holding ASA d/t AKI  Mitral Valve Replacement: Patient s/p mitral valve replacement on chronic anticoagulation with INR of 3.8. Last echo in 2015 reported properly functioning prosthetic valve.  - echo pending - continue coumadin per pharmacy  HTN: BP normotensive on admission at 137/76 in ED. Patient on home HCTZ and Ramipril 10mg  daily.  -will hold HCTZ and Remipril as patient with  acute AKI   Pseudogout: not in flare. Patient reports 3-4 episodes per year for which he takes colchicine BID for four days. His last course ended yesterday. No complaints of joint pain on admission.  -continue  to monitor  Hx of Prostate Cancer: Patient noted to have history of prostate cancer. Patient sees oncology at [facility] annually with his next appt scheduled for Nov. He reports completing his chemotherapy and radiation treatment years ago. He does not report any obstructive urinary symptoms outside of scrotal swelling today. -monitor UOP   FEN/GI: renal diet  Prophylaxis: coumadin  Disposition: admit to medsurg  History of Present Illness:  Mario Powell is a 72 y.o. male presenting with increased leg swelling.   Patient reports first noticing increased leg swelling four weeks ago. He reports having chronic lower extremity edema for 20 years but that this recent increase was much worse than his baseline in that it has progressively worsened extending up to his scrotum and abdomen. He reports having abdominal distention that interferes with his daily exercising and decreases his appetite lately. He also reports some SOB with this recent episode of increased swelling without orthopnea. He also notes a 10 pound weight gain (previously 193, now 203). He has been attempting to get his weight down since noticing this weight gain but has only been able to get to 202.8 as of the morning of presentation. In the setting of these recent changes, he denies increasing his dietary intake or salt consumption. He denies using salt on his food most days outside of yesterday.  He has no difficulty urinating without changes in his urine output or frequency.  His only NSAID use is colchicine 0.6 mg BID for 4 days when has a pseudogout flare. He reports having flares only 2-3 times a year. Last episode was this week and finished last dose of colchicine one the day prior to admission. Patient denies any other NSAID use outside of these flares.    Of note, he recently had INR that was very high(6) and had Coumadin adjustments. INR is 3.8 today (goal of 2.5-3.5).  Review Of Systems: Per HPI with the following  additions:   Review of Systems  Constitutional:       10lb weight gain    HENT: Positive for congestion and Powell loss (wears Powell aids ). Negative for sore throat.   Eyes: Negative for blurred vision.  Respiratory: Negative for cough and shortness of breath.   Cardiovascular: Positive for leg swelling. Negative for chest pain and orthopnea.  Gastrointestinal: Positive for diarrhea. Negative for abdominal pain.       Abdominal distention   Genitourinary: Negative for dysuria.       Scrotal swelling without pain    Musculoskeletal: Negative for joint pain.  Neurological: Positive for headaches.  Endo/Heme/Allergies: Positive for environmental allergies.    Patient Active Problem List   Diagnosis Date Noted  . Encounter for therapeutic drug monitoring 04/16/2017  . CAD (coronary artery disease) of artery bypass graft 07/12/2013  . Essential hypertension 07/12/2013  . Prostate cancer ([place]) 07/12/2013  . Chronic anticoagulation 07/12/2013  . Paroxysmal atrial fibrillation ([place]) 07/12/2013  . History of mitral valve replacement with mechanical valve 01/11/2013    Past Medical History: Past Medical History:  Diagnosis Date  . Atrial fibrillation ([place]) 07/12/2013   Perioperative, 2001   . CAD (coronary artery disease) of artery bypass graft 07/12/2013   Saphenous vein graft 2001   . Chronic anticoagulation 07/12/2013  .  Essential hypertension 07/12/2013  . History of mitral valve replacement with mechanical valve 01/11/2013   Mechanical mitral valve 2001  Bacterial endocarditis as the cause   . Prostate cancer ([place]) 07/12/2013    Past Surgical History: Past Surgical History:  Procedure Laterality Date  . CORONARY ARTERY BYPASS GRAFT     2001  . MITRAL VALVE REPLACEMENT  2001   Mechanical prosthesis    Social History: Social History   Tobacco Use  . Smoking status: Former Research scientist (life sciences)  . Smokeless tobacco: Never Used  Substance Use Topics  . Alcohol use: Not on file  .  Drug use: Not on file   Additional social history:   former smoker 1ppd for 27 years, quit 1986. No alcohol. No drugs. Lives at home with daughter. Works in Engineer, technical sales.  Please also refer to relevant sections of EMR.  Family History: Family History  Problem Relation Age of Onset  . Heart attack Father   . Healthy Mother   . Healthy Sister   . Healthy Sister     Allergies and Medications: Allergies  Allergen Reactions  . Penicillins Other (See Comments)    REACTION: UNKNOWN   No current facility-administered medications on file prior to encounter.    Current Outpatient Medications on File Prior to Encounter  Medication Sig Dispense Refill  . aspirin 81 MG tablet Take 81 mg by mouth daily.    Marland Kitchen ezetimibe (ZETIA) 10 MG tablet Take 1 tablet (10 mg total) by mouth daily. 90 tablet 3  . hydrochlorothiazide (HYDRODIURIL) 25 MG tablet Take 1 tablet (25 mg total) by mouth daily. 90 tablet 3  . niacin (NIASPAN) 1000 MG CR tablet Take 1 tablet (1,000 mg total) by mouth at bedtime. 90 tablet 3  . ramipril (ALTACE) 10 MG capsule Take 1 capsule (10 mg total) by mouth 2 (two) times daily. 180 capsule 3  . rosuvastatin (CRESTOR) 40 MG tablet Take 1 tablet (40 mg total) by mouth daily. 90 tablet 3  . warfarin (COUMADIN) 1 MG tablet TAKE AS DIRECTED BY ANTICOAGULATION CLINIC (Patient taking differently: Take 1 mg by mouth See admin instructions. Take 1 tablet (1MG ) by mouth daily except on Mon and Fridays take 1/2 tablet (0.5MG ).) 110 tablet 1    Objective: BP (!) 144/79   Pulse 94   Temp (!) 97.2 F (36.2 C) (Axillary)   Resp (!) 21   SpO2 100%   Exam: General: elderly man in NAD sitting up in bed with Powell aids in bilateral ears  Eyes: non-icteric, PERRLA bilaterally  ENTM: non-exudative oropharynx, non edematous tonsils Neck: no cervical or supraclavicular lymphadenopathy  Cardiovascular: click noted due to prosthetic mitral valve  Respiratory: bibasilar crackles, normal work of breathing  on room air Gastrointestinal: distended, NT, distant bowel sounds, unable to palpate liver edge due to distention no fluid wave Extremities: bilateral lower extremity 3+pitting edema up to hips, no warmth, non tender   Derm: venous stasis changes on bilateral lower extermities Psych: patient cooperative with interviewers, slowed speech 2/2 to Powell aid adjustments, appears well groomed with pleasant affect, appropriate judgment and thought content   Labs and Imaging: CBC BMET  Recent Labs  Lab 12/01/17 1104  WBC 3.7*  HGB 12.9*  HCT 37.8*  PLT 193   Recent Labs  Lab 12/01/17 1104  NA 131*  K 4.7  CL 99  CO2 21*  BUN 33*  CREATININE 3.12*  GLUCOSE 127*  CALCIUM 8.9      Troponin (Point of Care Test)  Recent Labs    12/01/17 1103  TROPIPOC 0.02    BNP    Component Value Date/Time   BNP 45.1 12/01/2017 1104    Dg Chest 2 View  Result Date: 12/01/2017 CLINICAL DATA:  Bilateral lower extremity swelling x1 month. SOB when supine. Pt denies chest pain. Hx of HTN, CAD, A-fib, Mitral valve replacement. Ex-smoker, quit in 1986. EXAM: CHEST - 2 VIEW COMPARISON:  None. FINDINGS: Sternotomy wires overlie normal cardiac silhouette. Moderate LEFT effusions present. Small RIGHT effusion. Lungs are clear. No osseous abnormality. IMPRESSION: Bilateral effusions, LEFT greater than RIGHT. No comparison available. Electronically Signed   By: Suzy Bouchard M.D.   On: 12/01/2017 11:24    Pattricia Boss, Medical Student 12/01/2017, 2:24 PM Acting Intern pager: [phone]  FPTS Upper-Level Resident Addendum  I have independently interviewed and examined the patient. I have discussed the above with the original author and agree with their documentation. My edits for correction/addition/clarification are in blue. Please see also any attending notes.   Bufford Lope, DO PGY-3, [facility] 12/01/2017 5:11 PM  [place] Service pager: [phone] (text pages welcome through  [place])

## 2017-12-01 NOTE — ED Triage Notes (Signed)
Pt initially reports he is here for diarrhea x2 days after taking Pepto Bismol.  Pt then admits to weight gain over the last month from 193-202.8.  Pt has bilateral lower extremity edema and some abdominal edema.  He reports some SOB when lying down and had some testicular swelling a few days ago that he says has no resolved.  Pt is in NAD at this time, but he does have flight of thought and some possible aphasia.

## 2017-12-01 NOTE — ED Triage Notes (Signed)
Sent here from urgent care for extreme swelling in legs- pt states that legs are always swollen, more so now. Has gained 7-8#,

## 2017-12-02 ENCOUNTER — Inpatient Hospital Stay (HOSPITAL_COMMUNITY): Payer: Medicare Other

## 2017-12-02 DIAGNOSIS — I10 Essential (primary) hypertension: Secondary | ICD-10-CM

## 2017-12-02 DIAGNOSIS — Z952 Presence of prosthetic heart valve: Secondary | ICD-10-CM

## 2017-12-02 DIAGNOSIS — I2510 Atherosclerotic heart disease of native coronary artery without angina pectoris: Secondary | ICD-10-CM

## 2017-12-02 DIAGNOSIS — N179 Acute kidney failure, unspecified: Principal | ICD-10-CM

## 2017-12-02 DIAGNOSIS — E785 Hyperlipidemia, unspecified: Secondary | ICD-10-CM

## 2017-12-02 DIAGNOSIS — R188 Other ascites: Secondary | ICD-10-CM

## 2017-12-02 DIAGNOSIS — R791 Abnormal coagulation profile: Secondary | ICD-10-CM

## 2017-12-02 LAB — HEPATITIS PANEL, ACUTE
HCV Ab: <0.1 s/co ratio (ref 0.0–0.9)
HEP B C IGM: NEGATIVE
HEP B S AG: NEGATIVE
Hep A IgM: NEGATIVE

## 2017-12-02 LAB — COMPREHENSIVE METABOLIC PANEL
ALT: 50 U/L — AB (ref 0–44)
AST: 79 U/L — ABNORMAL HIGH (ref 15–41)
Albumin: 2.3 g/dL — ABNORMAL LOW (ref 3.5–5.0)
Alkaline Phosphatase: 68 U/L (ref 38–126)
Anion gap: 9 (ref 5–15)
BILIRUBIN TOTAL: 1.1 mg/dL (ref 0.3–1.2)
BUN: 36 mg/dL — ABNORMAL HIGH (ref 8–23)
CHLORIDE: 101 mmol/L (ref 98–111)
CO2: 23 mmol/L (ref 22–32)
CREATININE: 2.96 mg/dL — AB (ref 0.61–1.24)
Calcium: 8.5 mg/dL — ABNORMAL LOW (ref 8.9–10.3)
GFR calc non Af Amer: 20 mL/min — ABNORMAL LOW (ref >60)
GFR, EST AFRICAN AMERICAN: 23 mL/min — AB (ref >60)
Glucose, Bld: 104 mg/dL — ABNORMAL HIGH (ref 70–99)
Potassium: 4.6 mmol/L (ref 3.5–5.1)
Sodium: 133 mmol/L — ABNORMAL LOW (ref 135–145)
TOTAL PROTEIN: 4.9 g/dL — AB (ref 6.5–8.1)

## 2017-12-02 LAB — HEMOGLOBIN A1C
HEMOGLOBIN A1C: 5.7 % — AB (ref 4.8–5.6)
MEAN PLASMA GLUCOSE: 117 mg/dL

## 2017-12-02 LAB — CBC
HEMATOCRIT: 31.9 % — AB (ref 39.0–52.0)
HEMOGLOBIN: 11 g/dL — AB (ref 13.0–17.0)
MCH: 34.4 pg — ABNORMAL HIGH (ref 26.0–34.0)
MCHC: 34.5 g/dL (ref 30.0–36.0)
MCV: 99.7 fL (ref 78.0–100.0)
Platelets: 150 10*3/uL (ref 150–400)
RBC: 3.2 MIL/uL — ABNORMAL LOW (ref 4.22–5.81)
RDW: 13.1 % (ref 11.5–15.5)
WBC: 3 10*3/uL — ABNORMAL LOW (ref 4.0–10.5)

## 2017-12-02 LAB — T4, FREE: Free T4: 1.25 ng/dL (ref 0.82–1.77)

## 2017-12-02 LAB — PROTIME-INR
INR: 4.47
Prothrombin Time: 42.2 seconds — ABNORMAL HIGH (ref 11.4–15.2)

## 2017-12-02 LAB — UREA NITROGEN, URINE: UREA NITROGEN UR: 741 mg/dL

## 2017-12-02 LAB — ECHOCARDIOGRAM COMPLETE

## 2017-12-02 MED ORDER — LIDOCAINE HCL 1 % IJ SOLN
INTRAMUSCULAR | Status: AC
  Filled 2017-12-02: qty 20

## 2017-12-02 MED ORDER — VITAMIN K1 10 MG/ML IJ SOLN
2.5000 mg | Freq: Once | INTRAVENOUS | Status: AC
  Administered 2017-12-02: 2.5 mg via INTRAVENOUS
  Filled 2017-12-02: qty 0.25

## 2017-12-02 MED ORDER — FUROSEMIDE 10 MG/ML IJ SOLN
40.0000 mg | Freq: Once | INTRAMUSCULAR | Status: AC
  Administered 2017-12-02: 40 mg via INTRAVENOUS
  Filled 2017-12-02: qty 4

## 2017-12-02 MED ORDER — PHYTONADIONE 5 MG PO TABS: 2.5000 mg | ORAL_TABLET | Freq: Once | ORAL | Status: DC

## 2017-12-02 NOTE — Evaluation (Signed)
Physical Therapy Evaluation Patient Details Name: Mario Powell MRN: [id number] DOB: 1945/05/01 Today's Date: 12/02/2017   History of Present Illness  Pt is a 72 y.o. M presenting with progressive increased leg swelling for 4 weeks. PMH is significant for a.fib on Coumadin, CAD, HTN, MV replacement, prostate cancer.  Clinical Impression  Patient evaluated by Physical Therapy with no further acute PT needs identified. Prior to admission, patient is independent with ADL's and mobility and works from home in [place]. Educated patient on lower extremity elevation, activity recommendations, and continued mobilization during inpatient hospital stay. Pt states he performs a HEP daily. Currently ambulating 150 feet with no assistive device independently. All education has been completed and the patient has no further questions. See below for any follow-up Physical Therapy or equipment needs. PT is signing off. Thank you for this referral.     Follow Up Recommendations No PT follow up    Equipment Recommendations  None recommended by PT    Recommendations for Other Services       Precautions / Restrictions Precautions Precautions: None Restrictions Weight Bearing Restrictions: No      Mobility  Bed Mobility Overal bed mobility: Independent                Transfers Overall transfer level: Independent                  Ambulation/Gait Ambulation/Gait assistance: Independent Gait Distance (Feet): 150 Feet Assistive device: None Gait Pattern/deviations: Step-through pattern;Wide base of support;Decreased dorsiflexion - right;Decreased dorsiflexion - left     General Gait Details: Patient with no gross unsteadiness noted, adequate speed, decreased heel strike and wide BOS utilized secondary to increased edema  Stairs            Wheelchair Mobility    Modified Rankin (Stroke Patients Only)       Balance Overall balance assessment: No apparent balance deficits (not  formally assessed)                                           Pertinent Vitals/Pain Pain Assessment: No/denies pain    Home Living Family/patient expects to be discharged to:: Private residence Living Arrangements: Children(daughter) Available Help at Discharge: Family Type of Home: House Home Access: Stairs to enter   Technical brewer of Steps: 7 Home Layout: Two level Home Equipment: None      Prior Function Level of Independence: Independent         Comments: works at a desk job doing Ship broker        Extremity/Trunk Assessment   Upper Extremity Assessment Upper Extremity Assessment: Defer to OT evaluation    Lower Extremity Assessment Lower Extremity Assessment: RLE deficits/detail;LLE deficits/detail RLE Deficits / Details: 3+ pitting edema LLE Deficits / Details: 3+ pitting edema    Cervical / Trunk Assessment Cervical / Trunk Assessment: Normal  Communication   Communication: HOH  Cognition Arousal/Alertness: Awake/alert Behavior During Therapy: WFL for tasks assessed/performed Overall Cognitive Status: Within Functional Limits for tasks assessed                                        General Comments      Exercises     Assessment/Plan    PT Assessment Patent  does not need any further PT services  PT Problem List         PT Treatment Interventions      PT Goals (Current goals can be found in the Care Plan section)  Acute Rehab PT Goals Patient Stated Goal: continue working PT Goal Formulation: All assessment and education complete, DC therapy    Frequency     Barriers to discharge        Co-evaluation               AM-PAC PT "6 Clicks" Daily Activity  Outcome Measure Difficulty turning over in bed (including adjusting bedclothes, sheets and blankets)?: None Difficulty moving from lying on back to sitting on the side of the bed? : None Difficulty sitting down on and  standing up from a chair with arms (e.g., wheelchair, bedside commode, etc,.)?: None Help needed moving to and from a bed to chair (including a wheelchair)?: None Help needed walking in hospital room?: None Help needed climbing 3-5 steps with a railing? : A Little 6 Click Score: 23    End of Session   Activity Tolerance: Patient tolerated treatment well Patient left: in bed;with call bell/phone within reach Nurse Communication: Mobility status PT Visit Diagnosis: Difficulty in walking, not elsewhere classified (R26.2);Other abnormalities of gait and mobility (R26.89)    Time: 2482-5003 PT Time Calculation (min) (ACUTE ONLY): 15 min   Charges:   PT Evaluation $PT Eval Moderate Complexity: 1 Mod     Ellamae Sia, [place], DPT Acute Rehabilitation Services Pager [phone] Office [phone]   Willy Eddy 12/02/2017, 10:28 AM

## 2017-12-02 NOTE — Progress Notes (Signed)
Family Medicine Teaching Service Daily Progress Note Intern Pager: [phone]  Patient name: Mario Powell Medical record number: [id number] Date of birth: 09-15-1945 Age: 72 y.o. Gender: male  Primary Care Provider: Patient, No Pcp Per Consultants: none Code Status: Full Code   Pt Overview and Major Events to Date:  anasarca extended to abdomen and AKI   Assessment and Plan:  Mario Powell is a 72 year old male presenting with progressive anasarca up to his abdomen.   PMH is significant for a.fib on Coumadin, CAD, HTN, MV replacement, prostate cancer.   Diffuse Anasarca likely 2/2 intrinsic acute kidney injury: Overnight, anasarca in lower extremities remains unchanged but minimally improved in abdomen. Patient reports feeling less abdominal tension, reports no changes in urinary frequency, denies SOB. Afebrile overnight,  On exam, no crackles noted on pulmonary auscultation, abdominal distention minmally decreased. Left pleural effusion noted on CXR and normal kidneys on renal ultrasound. Of note, leukopenia with WBC of 3. Concern for DM nephropathy less likely with A1C: 5.7,  AKI with minimal improvement Cr:2.9 from 3.12, BUN: 36 from 33,  Elevated protein/cr ratio of 0.25 but not in nephrotic range concern for hypothyroidism with TSH elevated at 9.6, T4 1.25 WNL   -echo read pending (last in 2015, EF 50-55%, normally functioning prosthetic valve) -renal ultrasound: normal sized kidneys, no hydronephrosis  -Urine urea pending  -strict I/Os  -40 mg IV Lasix  -collect 24 hour Cr  Supratherapeutic INR (Goal 2.5-3.5): INR has increased to 4.47 from 3.87. Patient on Coumadin with recent elevation of INR to 6 four weeks ago for which his provider adjusted his dosage. This elevated INR is likely secondary to hepatic congestion from anasarca or chronic anticoagulation on Coumadin, consider statin decreasing warfarin metabolism  -holding Coumadin  -repeat CBC and INR   Transaminitis: LFT levels  trending downward AST 79 from 86, ALT 50 from 58. Concern for SBP vs acute hepatitis.  -RUQ u/s -consulted interventional radiology for abdominal paracentesis  -repeat urine protein/cr ratio -pending hepatitis panel  -repeat CMP  -holding hepatotoxic agents   Atrial Fibrillation on Coumadin:  History of atrial fibrillation, patient on Coumadin and currently supratherapeutic at 4.47   - continue coumadin per pharmacy -EKG completed  CAD: Patient on ASA 81mg  for secondary prevention due to CAD and MV replacement.  - holding ASA d/t AKI  Mitral Valve Replacement: Patient s/p mitral valve replacement on chronic anticoagulation with INR of 3.8. Last echo in 2015 reported properly functioning prosthetic valve.  -echo pending -continue coumadin per pharmacy  Pseudogout: Patient reported history with Colchicine when in active flare. Not an active problem during this admission. Patient has no complaints of this at this time.  -continue to monitor   Hx of Prostate Cancer: Patient completed treatment years ago and follows with oncologist at [facility] annually. No urinary symptoms during this admission so far outside of scrotal swelling 2/2 to anasarca extension. -monitor for obstructive symptoms   FEN/GI: renal diet  WYO:VZCH due to AKI, supratherapeutic INR  Disposition: pending renal improvement   Subjective:  This morning, Mario Powell reports no new complaints. He reports that the tightness in his abdomen has improved somewhat overnight and that his appetite has improved. Her denies any changes in his urinary frequency, SOB, orthopnea, fevers or chills. He reported having a loose but not watery BM overnight with adequate PO intake.   Objective: Temp:  [97.2 F (36.2 C)-98.5 F (36.9 C)] 98.5 F (36.9 C) (09/19 0437) Pulse Rate:  [  79-100] 85 (09/19 0437) Resp:  [16-27] 18 (09/19 0437) BP: (103-144)/(70-88) 114/74 (09/19 0437) SpO2:  [97 %-100 %] 97 % (09/19 0437)  Physical  Exam:  General: elderly male sitting up in bed eating breakfast Cardiovascular: MV click noted with prosthetic valve, RRR, pulses palpable, delayed capillary refill  Respiratory: appears to be aerating well, no wheezing or crackles noted on exam  Abdomen: mildly distended but somewhat decreased from previous exam, +BS, no masses palpable  Extremities: moves all, 3+ pitting edema on bilateral lower extremities, non tender to palpation   Laboratory: Recent Labs  Lab 12/01/17 1104 12/02/17 0651  WBC 3.7* 3.0*  HGB 12.9* 11.0*  HCT 37.8* 31.9*  PLT 193 150   Recent Labs  Lab 12/01/17 1104 12/02/17 0651  NA 131* 133*  K 4.7 4.6  CL 99 101  CO2 21* 23  BUN 33* 36*  CREATININE 3.12* 2.96*  CALCIUM 8.9 8.5*  PROT 5.7* 4.9*  BILITOT 0.7 1.1  ALKPHOS 80 68  ALT 58* 50*  AST 86* 79*  GLUCOSE 127* 104*   Imaging/Diagnostic Tests:  Dg Chest 2 View  Result Date: 12/01/2017 CLINICAL DATA:  Bilateral lower extremity swelling x1 month. SOB when supine. Pt denies chest pain. Hx of HTN, CAD, A-fib, Mitral valve replacement. Ex-smoker, quit in 1986. EXAM: CHEST - 2 VIEW COMPARISON:  None. FINDINGS: Sternotomy wires overlie normal cardiac silhouette. Moderate LEFT effusions present. Small RIGHT effusion. Lungs are clear. No osseous abnormality. IMPRESSION: Bilateral effusions, LEFT greater than RIGHT. No comparison available. Electronically Signed   By: Suzy Bouchard M.D.   On: 12/01/2017 11:24   US Renal  Result Date: 12/01/2017 CLINICAL DATA:  Acute kidney injury EXAM: RENAL / URINARY TRACT ULTRASOUND COMPLETE COMPARISON:  CT 11/22/2006 FINDINGS: Right Kidney: Length: 9.2 cm. Echogenicity within normal limits. No mass or hydronephrosis visualized. Left Kidney: Length: 7.4 cm. Echogenicity within normal limits. No mass or hydronephrosis visualized. Bladder: Appears normal for degree of bladder distention. Moderate to large amount of free fluid in the abdomen. Incidental note made of left  pleural effusion. IMPRESSION: 1. Ultrasound appearance of the kidneys is within normal limits 2. Moderate to large amount of ascites in the abdomen. Left pleural effusion. Electronically Signed   By: Donavan Foil M.D.   On: 12/01/2017 19:49     Pattricia Boss, Medical Student 12/02/2017, 9:35 AM Acting Intern pager: [phone]

## 2017-12-02 NOTE — Progress Notes (Signed)
Patient ID: Mario Powell, male   DOB: 04/15/1945, 72 y.o.   MRN: [id number] Pt presented to IR dept today for paracentesis. He has a moderate amount of ascites present but his PT/INR today is 42.2/4.47. This level is too high to safely perform paracentesis per Dr. Kathlene Cote. Will recheck status tomorrow and proceed if INR in lower 3 range. Pt informed.

## 2017-12-02 NOTE — Progress Notes (Signed)
  Echocardiogram 2D Echocardiogram has been performed.  Mario Powell 12/02/2017, 11:24 AM

## 2017-12-02 NOTE — Progress Notes (Signed)
OT Cancellation Note  Patient Details Name: Mario Powell MRN: [id number] DOB: October 11, 1945   Cancelled Treatment:    Reason Eval/Treat Not Completed: Patient at procedure or test/ unavailable (echo); will follow up for OT eval as schedule permits.  Lou Cal, OT Supplemental Rehabilitation Services Pager [phone] Office [phone]    Raymondo Band 12/02/2017, 10:50 AM

## 2017-12-02 NOTE — Progress Notes (Signed)
[place] for warfarin Indication: mechanical MVR, atrial fibrillation  Allergies  Allergen Reactions  . Penicillins Other (See Comments)    REACTION: UNKNOWN    Patient Measurements:    Vital Signs: Temp: 98.5 F (36.9 C) (09/19 0437) Temp Source: Oral (09/19 0437) BP: 114/74 (09/19 0437) Pulse Rate: 85 (09/19 0437)  Labs: Recent Labs    11/29/17 1334 12/01/17 1104 12/02/17 0651  HGB  --  12.9* 11.0*  HCT  --  37.8* 31.9*  PLT  --  193 150  LABPROT  --  37.7* 42.2*  INR 3.6* 3.87 4.47*  CREATININE  --  3.12* 2.96*    CrCl cannot be calculated (Unknown ideal weight.).   Medical History: Past Medical History:  Diagnosis Date  . Anasarca 12/01/2017  . Atrial fibrillation ([place]) 07/12/2013   Perioperative, 2001   . CAD (coronary artery disease) of artery bypass graft 07/12/2013   Saphenous vein graft 2001   . Chronic anticoagulation 07/12/2013  . Essential hypertension 07/12/2013  . History of mitral valve replacement with mechanical valve 01/11/2013   Mechanical mitral valve 2001  Bacterial endocarditis as the cause   . HOH (hard of hearing)   . Prostate cancer ([place]) 07/12/2013   Assessment: 35 yom presented to the ED with leg swelling. He is on chronic warfarin for history of mechanical MVR. INR is supratherapeutic at 4.47. Hgb 12.9 and platelets are WNL. No bleeding noted.   Goal of Therapy:  INR 2.5-3.5 Monitor platelets by anticoagulation protocol: Yes   Plan:  No warfarin today Daily INR  Keylor Rands A. Levada Dy, PharmD, [place] Pager: [phone] Please utilize Amion for appropriate phone number to reach the unit pharmacist ([place])   12/02/2017,8:49 AM

## 2017-12-02 NOTE — Plan of Care (Signed)
  Problem: Activity: Goal: Risk for activity intolerance will decrease Outcome: Progressing   

## 2017-12-02 NOTE — Evaluation (Signed)
Occupational Therapy Evaluation Patient Details Name: Mario Powell MRN: [id number] DOB: 03/29/1945 Today's Date: 12/02/2017    History of Present Illness Pt is a 72 y.o. M presenting with progressive increased leg swelling for 4 weeks. PMH is significant for a.fib on Coumadin, CAD, HTN, MV replacement, prostate cancer.   Clinical Impression   This 72 y/o male presents with the above. At baseline pt is independent with ADLs, iADLs and functional mobility. Pt demonstrating room level mobility without AD, toileting, standing grooming and LB ADLs with supervision throughout. Pt reports feeling at his baseline regarding ADL and mobility completion, daughter present and in agreement. Educated pt regarding general edema reduction/management techniques with pt verbalizing understanding. Questions answered throughout. No further acute OT needs identified at this time. Will sign off; thank you for this referral.     Follow Up Recommendations  No OT follow up;Supervision - Intermittent    Equipment Recommendations  None recommended by OT           Precautions / Restrictions Precautions Precautions: None Restrictions Weight Bearing Restrictions: No      Mobility Bed Mobility Overal bed mobility: Independent                Transfers Overall transfer level: Independent                    Balance Overall balance assessment: No apparent balance deficits (not formally assessed)                                         ADL either performed or assessed with clinical judgement   ADL Overall ADL's : Needs assistance/impaired Eating/Feeding: Independent;Sitting   Grooming: Supervision/safety;Standing;Wash/dry hands   Upper Body Bathing: Supervision/ safety;Standing   Lower Body Bathing: Supervison/ safety;Sit to/from stand   Upper Body Dressing : Modified independent;Sitting   Lower Body Dressing: Supervision/safety;Sit to/from stand Lower Body Dressing  Details (indicate cue type and reason): pt easily able to access feet and donning/doffing socks during session without difficulty  Toilet Transfer: Supervision/safety;Ambulation;Regular Glass blower/designer Details (indicate cue type and reason): pt up in bathroom upon entering room with daughter providing distant supervision  Toileting- Clothing Manipulation and Hygiene: Supervision/safety;Sit to/from stand       Functional mobility during ADLs: Supervision/safety General ADL Comments: educated on edema reduction techniques for LEs as pt reports he is sitting majority of the day                         Pertinent Vitals/Pain Pain Assessment: No/denies pain     Hand Dominance     Extremity/Trunk Assessment Upper Extremity Assessment Upper Extremity Assessment: RUE deficits/detail;LUE deficits/detail RUE Deficits / Details: mild edema noted in UEs, overall WFL for tasks assessed LUE Deficits / Details: mild edema noted in UEs, overall WFL for tasks assessed   Lower Extremity Assessment Lower Extremity Assessment: Defer to PT evaluation RLE Deficits / Details: 3+ pitting edema LLE Deficits / Details: 3+ pitting edema   Cervical / Trunk Assessment Cervical / Trunk Assessment: Normal   Communication Communication Communication: HOH   Cognition Arousal/Alertness: Awake/alert Behavior During Therapy: WFL for tasks assessed/performed Overall Cognitive Status: Within Functional Limits for tasks assessed  General Comments       Exercises     Shoulder Instructions      Home Living Family/patient expects to be discharged to:: Private residence Living Arrangements: Children(daughter) Available Help at Discharge: Family Type of Home: House Home Access: Stairs to enter Technical brewer of Steps: 7   Home Layout: Two level Alternate Level Stairs-Number of Steps: 12 Alternate Level Stairs-Rails: Right;Left            Home Equipment: None          Prior Functioning/Environment Level of Independence: Independent        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, technical sales, driving        OT Problem List: Increased edema      OT Treatment/Interventions:      OT Goals(Current goals can be found in the care plan section) Acute Rehab OT Goals Patient Stated Goal: continue working OT Goal Formulation: All assessment and education complete, DC therapy  OT Frequency:     Barriers to D/C:            Co-evaluation              AM-PAC PT "6 Clicks" Daily Activity     Outcome Measure Help from another person eating meals?: None Help from another person taking care of personal grooming?: None Help from another person toileting, which includes using toliet, bedpan, or urinal?: None Help from another person bathing (including washing, rinsing, drying)?: None Help from another person to put on and taking off regular upper body clothing?: None Help from another person to put on and taking off regular lower body clothing?: None 6 Click Score: 24   End of Session Nurse Communication: Mobility status  Activity Tolerance: Patient tolerated treatment well Patient left: in bed;with call bell/phone within reach;with family/visitor present;Other (comment)(transport arriving to take pt to procedure )  OT Visit Diagnosis: Other abnormalities of gait and mobility (R26.89)                Time: 4431-5400 OT Time Calculation (min): 14 min Charges:  OT General Charges $OT Visit: 1 Visit OT Evaluation $OT Eval Low Complexity: [place], OT [facility] Pager [phone] Office [phone]    Raymondo Band 12/02/2017, 3:30 PM

## 2017-12-03 ENCOUNTER — Encounter (HOSPITAL_COMMUNITY): Payer: Self-pay | Admitting: Student

## 2017-12-03 ENCOUNTER — Inpatient Hospital Stay (HOSPITAL_COMMUNITY): Payer: Medicare Other

## 2017-12-03 DIAGNOSIS — R188 Other ascites: Secondary | ICD-10-CM

## 2017-12-03 DIAGNOSIS — K7460 Unspecified cirrhosis of liver: Secondary | ICD-10-CM

## 2017-12-03 DIAGNOSIS — E871 Hypo-osmolality and hyponatremia: Secondary | ICD-10-CM

## 2017-12-03 DIAGNOSIS — Z7901 Long term (current) use of anticoagulants: Secondary | ICD-10-CM

## 2017-12-03 DIAGNOSIS — N179 Acute kidney failure, unspecified: Secondary | ICD-10-CM

## 2017-12-03 HISTORY — PX: IR PARACENTESIS: IMG2679

## 2017-12-03 LAB — GRAM STAIN

## 2017-12-03 LAB — LACTATE DEHYDROGENASE, PLEURAL OR PERITONEAL FLUID: LD FL: 126 U/L — AB (ref 3–23)

## 2017-12-03 LAB — COMPREHENSIVE METABOLIC PANEL
ALT: 37 U/L (ref 0–44)
ANION GAP: 7 (ref 5–15)
AST: 53 U/L — ABNORMAL HIGH (ref 15–41)
Albumin: 2.3 g/dL — ABNORMAL LOW (ref 3.5–5.0)
Alkaline Phosphatase: 55 U/L (ref 38–126)
BILIRUBIN TOTAL: 0.8 mg/dL (ref 0.3–1.2)
BUN: 34 mg/dL — ABNORMAL HIGH (ref 8–23)
CALCIUM: 8.4 mg/dL — AB (ref 8.9–10.3)
CO2: 21 mmol/L — ABNORMAL LOW (ref 22–32)
Chloride: 103 mmol/L (ref 98–111)
Creatinine, Ser: 2.71 mg/dL — ABNORMAL HIGH (ref 0.61–1.24)
GFR calc non Af Amer: 22 mL/min — ABNORMAL LOW (ref >60)
GFR, EST AFRICAN AMERICAN: 25 mL/min — AB (ref >60)
GLUCOSE: 109 mg/dL — AB (ref 70–99)
Potassium: 4.3 mmol/L (ref 3.5–5.1)
Sodium: 131 mmol/L — ABNORMAL LOW (ref 135–145)
TOTAL PROTEIN: 4.6 g/dL — AB (ref 6.5–8.1)

## 2017-12-03 LAB — CBC
HCT: 30.3 % — ABNORMAL LOW (ref 39.0–52.0)
HEMOGLOBIN: 10.2 g/dL — AB (ref 13.0–17.0)
MCH: 33.9 pg (ref 26.0–34.0)
MCHC: 33.7 g/dL (ref 30.0–36.0)
MCV: 100.7 fL — ABNORMAL HIGH (ref 78.0–100.0)
PLATELETS: 142 10*3/uL — AB (ref 150–400)
RBC: 3.01 MIL/uL — ABNORMAL LOW (ref 4.22–5.81)
RDW: 13.4 % (ref 11.5–15.5)
WBC: 2.7 10*3/uL — ABNORMAL LOW (ref 4.0–10.5)

## 2017-12-03 LAB — BODY FLUID CELL COUNT WITH DIFFERENTIAL
EOS FL: 0 %
LYMPHS FL: 41 %
MONOCYTE-MACROPHAGE-SEROUS FLUID: 38 % — AB (ref 50–90)
Neutrophil Count, Fluid: 21 % (ref 0–25)
Total Nucleated Cell Count, Fluid: 157 cu mm (ref 0–1000)

## 2017-12-03 LAB — CREATININE, URINE, 24 HOUR
CREATININE 24H UR: 1395 mg/d (ref 800–2000)
Collection Interval-UCRE24: 24 hours
Creatinine, Urine: 69.75 mg/dL
Urine Total Volume-UCRE24: 2000 mL

## 2017-12-03 LAB — PROTIME-INR
INR: 1.95
PROTHROMBIN TIME: 22.1 s — AB (ref 11.4–15.2)

## 2017-12-03 LAB — PROTEIN, URINE, 24 HOUR
Collection Interval-UPROT: 24 hours
PROTEIN, 24H URINE: 180 mg/d — AB (ref 50–100)
Urine Total Volume-UPROT: 2000 mL

## 2017-12-03 LAB — PROTEIN, PLEURAL OR PERITONEAL FLUID

## 2017-12-03 LAB — HEPARIN LEVEL (UNFRACTIONATED): HEPARIN UNFRACTIONATED: 0.25 [IU]/mL — AB (ref 0.30–0.70)

## 2017-12-03 LAB — PATHOLOGIST SMEAR REVIEW

## 2017-12-03 LAB — ALBUMIN, PLEURAL OR PERITONEAL FLUID: Albumin, Fluid: 1.8 g/dL

## 2017-12-03 LAB — T3, FREE: T3 FREE: 2.2 pg/mL (ref 2.0–4.4)

## 2017-12-03 MED ORDER — FUROSEMIDE 10 MG/ML IJ SOLN
80.0000 mg | Freq: Once | INTRAMUSCULAR | Status: AC
  Administered 2017-12-03: 80 mg via INTRAVENOUS
  Filled 2017-12-03: qty 8

## 2017-12-03 MED ORDER — LIDOCAINE HCL 1 % IJ SOLN
INTRAMUSCULAR | Status: AC
  Filled 2017-12-03: qty 20

## 2017-12-03 MED ORDER — HEPARIN (PORCINE) IN NACL 100-0.45 UNIT/ML-% IJ SOLN
1300.0000 [IU]/h | INTRAMUSCULAR | Status: DC
  Administered 2017-12-03: 1250 [IU]/h via INTRAVENOUS
  Filled 2017-12-03 (×4): qty 250

## 2017-12-03 NOTE — Discharge Instructions (Addendum)
Dear Mario Powell,   Thank you for letting us participate in your care! In this section, you will find a brief hospital admission summary of why you were admitted to the hospital, what happened, and any further follow up we think would benefit you and your health:   You were admitted because you were experiencing fullness in your abdomen and increased swelling in your legs, especially in the last four weeks. Your initial work up was significant for fluid in your abdomen. It was described at "chylous fluid". We worked up the fluid and to see if it might have been from liver or heart disease. Both work ups were negative.  During your work-up, gastroenterology specialist recommended getting both upper and lower endoscopies.  The upper endoscopy, AKA EGD, showed a small bleeding area that was successfully clipped off during the procedure.  Your colonoscopy showed a 10 mm polyp which was removed and sent for biopsy.  Your colon also showed some signs of inflammation.  You have an appointment with GI with Dr. Marthe Patch on October 22 to follow-up these findings.  You were found to have low blood counts, AKA "pancytopenia," on admission.  We would like you to follow-up with the hematologist oncologist's for this concern.  They will call you to set up this appointment.   For your GI procedures, we switched off of Coumadin and went to a heparin drip.  You were converted back to Coumadin prior to your discharge.  We bridged you with Lovenox, which will expect he will need to take for about 3 to 5 days.  He will have a follow-up appointment at the Coumadin clinic on Friday, September 27 and again on Monday, September 30th.   You should also see Dr. Lovena Neighbours (your new urologist) for a follow up appointment in November. Your PSA during your admission was normal.   You will see the Family Medicine team in clinic. We look forward to getting to know you better and helping in any way we can. We will manage many of your  medications, labs, and studies so you can always call and ask [place] for questions. We will also help you to find other specialists that you may need for your care. Some medications, such as coumadin, will be managed by other specialties, such as cardiology.   He was also seen by nutrition who suggested the following to increase protein in your diet and to decrease longchain fatty acids.  A multivitamin with minerals, MCT Oil, boost breeze, Protostat 30 mL fat-free supplement were all recommended IV dietitian.  Please continue to take the multivitamins and MCT oil daily, and drink supplements between meals. Protostat 41mL has been prescribed and sent to your pharmacy. Boost Resource Gwyneth Revels is a protein drink that you can take three times a day between meals if you do not meet your protein goals. This is not prescribed, but can be bought at [employer] (I.e. [employer], etc).   POST-HOSPITAL/FOLLOW-UP CARE INSTRUCTIONS Home instructions:  1. Keep your legs elevated as often as you can.  2. Weigh yourself daily and keep a log (a piece paper will do!) next to your scale and bring this paper to you doctor's appointments.  3. Go to all of your follow up appointments. 4. Please call with any questions at all!  5. Please continue a low fat diet in an effort to prevent any accumulation of fluid in your belly.  6. Take your medications as prescribed.   Doctors appointments & follow  up:  Future Appointments  Date Time Provider [place]  12/10/2017 11:15 AM [facility]  12/13/2017  1:00 PM [facility]  12/17/2017  2:30 PM Wilber Oliphant, MD [facility]   01/04/2018               10:45 AM                [facility]  (Address Below) Tuesday Jan 04, 2018  Specialty: Gastroenterology  at 10:45AM.  Follow-up for anemia and anasarca.  [facility]  [place] [address]    [phone]    COUMADIN INSTRUCTIONS   Continue to take 120mg  (1 syringe) of lovenox daily.   Take coumadin as follows:   Wednesday 9/25 - 1mg   Thursday 9/26 - 1mg    Friday 9/27 0.5mg    Then resume your regular dosing as instructed by your cardiologist. Prior to admission, your dosing was 1mg  every day, except Mondays and Fridays take 0.5mg ).   Thank you for choosing [facility]! Take care and be well!  [facility]  [place], [place] [address] [phone]   ==================================================================================== Information on my medicine - Coumadin   (Warfarin)  This medication education was reviewed with me or my healthcare representative as part of my discharge preparation.    Why was Coumadin prescribed for you? Coumadin was prescribed for you because you have a blood clot or a medical condition that can cause an increased risk of forming blood clots. Blood clots can cause serious health problems by blocking the flow of blood to the heart, lung, or brain. Coumadin can prevent harmful blood clots from forming. As a reminder your indication for Coumadin is:   Blood Clot Prevention After Heart Valve Surgery  What test will check on my response to Coumadin? While on Coumadin (warfarin) you will need to have an INR test regularly to ensure that your dose is keeping you in the desired range. The INR (international normalized ratio) number is calculated from the result of the laboratory test called prothrombin time (PT).  If an INR APPOINTMENT HAS NOT ALREADY BEEN MADE FOR YOU please schedule an appointment to have this lab work done by your health care provider within 7 days. Your INR goal is usually a number between:  2 to 3 or your provider may give you a more narrow range like 2-2.5.  Ask your health care provider during an office visit what your  goal INR is.  What  do you need to  know  About  COUMADIN? Take Coumadin (warfarin) exactly as prescribed by your healthcare provider about the same time each day.  DO NOT stop taking without talking to the doctor who prescribed the medication.  Stopping without other blood clot prevention medication to take the place of Coumadin may increase your risk of developing a new clot or stroke.  Get refills before you run out.  What do you do if you miss a dose? If you miss a dose, take it as soon as you remember on the same day then continue your regularly scheduled regimen the next day.  Do not take two doses of Coumadin at the same time.  Important Safety Information A possible side effect of Coumadin (Warfarin) is an increased risk of bleeding. You should call your healthcare provider right away if you experience any  of the following: ? Bleeding from an injury or your nose that does not stop. ? Unusual colored urine (red or dark brown) or unusual colored stools (red or black). ? Unusual bruising for unknown reasons. ? A serious fall or if you hit your head (even if there is no bleeding).  Some foods or medicines interact with Coumadin (warfarin) and might alter your response to warfarin. To help avoid this: ? Eat a balanced diet, maintaining a consistent amount of Vitamin K. ? Notify your provider about major diet changes you plan to make. ? Avoid alcohol or limit your intake to 1 drink for women and 2 drinks for men per day. (1 drink is 5 oz. wine, 12 oz. beer, or 1.5 oz. liquor.)  Make sure that ANY health care provider who prescribes medication for you knows that you are taking Coumadin (warfarin).  Also make sure the healthcare provider who is monitoring your Coumadin knows when you have started a new medication including herbals and non-prescription products.  Coumadin (Warfarin)  Major Drug Interactions  Increased Warfarin Effect Decreased Warfarin Effect  Alcohol (large  quantities) Antibiotics (esp. Septra/Bactrim, Flagyl, Cipro) Amiodarone (Cordarone) Aspirin (ASA) Cimetidine (Tagamet) Megestrol (Megace) NSAIDs (ibuprofen, naproxen, etc.) Piroxicam (Feldene) Propafenone (Rythmol SR) Propranolol (Inderal) Isoniazid (INH) Posaconazole (Noxafil) Barbiturates (Phenobarbital) Carbamazepine (Tegretol) Chlordiazepoxide (Librium) Cholestyramine (Questran) Griseofulvin Oral Contraceptives Rifampin Sucralfate (Carafate) Vitamin K   Coumadin (Warfarin) Major Herbal Interactions  Increased Warfarin Effect Decreased Warfarin Effect  Garlic Ginseng Ginkgo biloba Coenzyme Q10 Green tea St. Johns wort    Coumadin (Warfarin) FOOD Interactions  Eat a consistent number of servings per week of foods HIGH in Vitamin K (1 serving =  cup)  Collards (cooked, or boiled & drained) Kale (cooked, or boiled & drained) Mustard greens (cooked, or boiled & drained) Parsley *serving size only =  cup Spinach (cooked, or boiled & drained) Swiss chard (cooked, or boiled & drained) Turnip greens (cooked, or boiled & drained)  Eat a consistent number of servings per week of foods MEDIUM-HIGH in Vitamin K (1 serving = 1 cup)  Asparagus (cooked, or boiled & drained) Broccoli (cooked, boiled & drained, or raw & chopped) Brussel sprouts (cooked, or boiled & drained) *serving size only =  cup Lettuce, raw (green leaf, endive, romaine) Spinach, raw Turnip greens, raw & chopped   These websites have more information on Coumadin (warfarin):  FailFactory.se; VeganReport.com.au;

## 2017-12-03 NOTE — Procedures (Signed)
PROCEDURE SUMMARY:  Successful US guided paracentesis from right lateral abdomen.  Yielded 5.0 liters of chylous fluid.  No immediate complications.  Pt tolerated well.   Specimen was sent for labs.  Docia Barrier PA-C 12/03/2017 2:46 PM

## 2017-12-03 NOTE — Progress Notes (Signed)
ANTICOAGULATION CONSULT NOTE - Follow-Up  Pharmacy Consult for Heparin + Warfarin Indication: mechanical MVR  Allergies  Allergen Reactions  . Penicillins Other (See Comments)    REACTION: UNKNOWN    Patient Measurements: Weight: 199 lb 8 oz (90.5 kg)  Ht: 5'11" IBW 75.3 kg Heparin Dosing Wt: 90.5 kg  Vital Signs: Temp: 97.8 F (36.6 C) (09/20 2029) Temp Source: Oral (09/20 2029) BP: 115/64 (09/20 2029) Pulse Rate: 84 (09/20 2029)  Labs: Recent Labs    12/01/17 1104 12/02/17 0651 12/03/17 0707 12/03/17 2032  HGB 12.9* 11.0* 10.2*  --   HCT 37.8* 31.9* 30.3*  --   PLT 193 150 142*  --   LABPROT 37.7* 42.2* 22.1*  --   INR 3.87 4.47* 1.95  --   HEPARINUNFRC  --   --   --  0.25*  CREATININE 3.12* 2.96* 2.71*  --     CrCl cannot be calculated (Unknown ideal weight.).   Medical History: Past Medical History:  Diagnosis Date  . Anasarca 12/01/2017  . Atrial fibrillation ([place]) 07/12/2013   Perioperative, 2001   . CAD (coronary artery disease) of artery bypass graft 07/12/2013   Saphenous vein graft 2001   . Chronic anticoagulation 07/12/2013  . Essential hypertension 07/12/2013  . History of mitral valve replacement with mechanical valve 01/11/2013   Mechanical mitral valve 2001  Bacterial endocarditis as the cause   . HOH (hard of hearing)   . Prostate cancer ([place]) 07/12/2013   Assessment: 89 YOM presented to the ED with leg swelling. He is on chronic warfarin for history of mechanical MVR. INR is supratherapeutic on admit at 3.87 on PTA dose of 1 mg daily EXCEPT for 0.5 mg on Mon/Fri. INR reversed with Vit K 2.5 mg IV x 1 on 9/19 for paracentesis 9/20. Pharmacy consulted to start a heparin bridge and resume warfarin post-op.   The patient is going for paracentesis this morning - so Heparin initiation delayed until post-procedure. IR okay with resuming post-op as long as there is no oozing - which RN reports none.   INR today is SUBtherapeutic (INR 1.95 << 4.47,  goal of 2.5-3.5). Hgb/Hct slight drop, plts 142. Per discussion with FMTS - to hold warfarin 9/20 due to possible plans for EGD by GI on 9/21.   Initial heparin level = 0.25  Goal of Therapy:  Heparin level 0.3-0.7 units/ml INR 2.5-3.5 Monitor platelets by anticoagulation protocol: Yes   Plan:  -Heparin to 1400 units / hr - Hold warfarin today per discussion with FMTS for possible plans for EGD by GI on 9/21.  - Will continue to monitor for any signs/symptoms of bleeding and will follow up with heparin level in 8 hours and PT/INR in the AM  Thank you for allowing pharmacy to be a part of this patient's care. Anette Guarneri, PharmD [phone] 12/03/2017 9:40 PM

## 2017-12-03 NOTE — Care Management Important Message (Signed)
Important Message  Patient Details  Name: Mario Powell MRN: [id number] Date of Birth: 01/25/1946   Medicare Important Message Given:  Yes    [name] 12/03/2017, 2:09 PM

## 2017-12-03 NOTE — Progress Notes (Signed)
ANTICOAGULATION CONSULT NOTE - Follow-Up  Pharmacy Consult for Heparin + Warfarin Indication: mechanical MVR  Allergies  Allergen Reactions  . Penicillins Other (See Comments)    REACTION: UNKNOWN    Patient Measurements: Weight: 199 lb 8 oz (90.5 kg)  Ht: 5'11" IBW 75.3 kg Heparin Dosing Wt: 90.5 kg  Vital Signs: Temp: 98 F (36.7 C) (09/20 0831) Temp Source: Oral (09/20 0831) BP: 100/66 (09/20 0831) Pulse Rate: 86 (09/20 0831)  Labs: Recent Labs    12/01/17 1104 12/02/17 0651 12/03/17 0707  HGB 12.9* 11.0* 10.2*  HCT 37.8* 31.9* 30.3*  PLT 193 150 142*  LABPROT 37.7* 42.2* 22.1*  INR 3.87 4.47* 1.95  CREATININE 3.12* 2.96* 2.71*    CrCl cannot be calculated (Unknown ideal weight.).   Medical History: Past Medical History:  Diagnosis Date  . Anasarca 12/01/2017  . Atrial fibrillation ([place]) 07/12/2013   Perioperative, 2001   . CAD (coronary artery disease) of artery bypass graft 07/12/2013   Saphenous vein graft 2001   . Chronic anticoagulation 07/12/2013  . Essential hypertension 07/12/2013  . History of mitral valve replacement with mechanical valve 01/11/2013   Mechanical mitral valve 2001  Bacterial endocarditis as the cause   . HOH (hard of hearing)   . Prostate cancer ([place]) 07/12/2013   Assessment: 66 YOM presented to the ED with leg swelling. He is on chronic warfarin for history of mechanical MVR. INR is supratherapeutic on admit at 3.87 on PTA dose of 1 mg daily EXCEPT for 0.5 mg on Mon/Fri. INR reversed with Vit K 2.5 mg IV x 1 on 9/19 for paracentesis 9/20. Pharmacy consulted to start a heparin bridge and resume warfarin post-op.   The patient is going for paracentesis this morning - so Heparin initiation delayed until post-procedure. IR okay with resuming post-op as long as there is no oozing - which RN reports none.   INR today is SUBtherapeutic (INR 1.95 << 4.47, goal of 2.5-3.5). Hgb/Hct slight drop, plts 142. Per discussion with FMTS - to hold  warfarin 9/20 due to possible plans for EGD by GI on 9/21.   Goal of Therapy:  Heparin level 0.3-0.7 units/ml INR 2.5-3.5 Monitor platelets by anticoagulation protocol: Yes   Plan:  - Start Heparin at 1250 units/hr (12.5 ml/hr) - Hold warfarin today per discussion with FMTS for possible plans for EGD by GI on 9/21.  - Will continue to monitor for any signs/symptoms of bleeding and will follow up with heparin level in 8 hours and PT/INR in the AM  Thank you for allowing pharmacy to be a part of this patient's care.  Alycia Rossetti, PharmD, BCPS Clinical Pharmacist Pager: [phone] Clinical phone for 12/03/2017 from 7a-3:30p: [phone] If after 3:30p, please call main pharmacy at: [phone] Please check AMION for all [place] numbers 12/03/2017 10:05 AM

## 2017-12-03 NOTE — Progress Notes (Signed)
Family Medicine Teaching Service Daily Progress Note Intern Pager: [phone]  Patient name: Mario Powell Medical record number: [id number] Date of birth: 25-Jul-1945 Age: 72 y.o. Gender: male  Primary Care Provider: Patient, No Pcp Per Consultants: none Code Status: Full Code   Pt Overview and Major Events to Date:  anasarca extended to abdomen and AKI   Assessment and Plan:  Mario Powell is a 72 year old male presenting with progressive anasarca up to his abdomen. PMH is significant for a.fib on Coumadin, CAD, HTN, MV replacement, prostate cancer.   Anasarca likely 2/2 Cirrhosis: Lower extremity edema remains grossly unchanged. Patient reports feeling less abdominal tension, reports no changes in urinary frequency, denies SOB. Afebrile overnight. S/p paracentesis 9/20. 24 hour urine protein creatinine pending. Patient given lasix yesterday with output of .5L overall with poor response to lasix, will increase dose to 80mg  IV. -RUQ u/s shows cholelithiasis, cirrhosis, and moderate ascites -24 hour urine protein/cr ratio -hepatitis panel negative -holding hepatotoxic agents  - GI consulted, appreciate recs- likely EGD 9/21 while patient on heparin - f/u paracentesis studies  Supratherapeutic INR (Goal 2.5-3.5): INR 1.95. Heparin bridge to warfarin.   -holding Coumadin, given IV vitamin K 2.5mg  per pharmacy recommendations   -repeat CBC and INR   AKI: Minimal improvement Cr:2.71, BUN: 34 -IR consulted for ascites, INR supratherapeutic so unable to perform paracentesis 9/19 -strict I/Os  -s/p 40 mg IV Lasix, repeat IV lasix 80 mg for hopeful better response  Rate controlled Atrial Fibrillation: On warfarin with goal INR 2.5-3.5 due to mechanical valve. INR and currently subtherapeutic at 1.95 s/p vit K.  - coumadin per pharmacy, will bridge with heparin per pharm - s/p IV vit K 2.5mg  9/19  CAD: Patient on ASA 81mg  for secondary prevention due to CAD and MV replacement.  - holding ASA  d/t AKI  Mitral Valve Replacement: Patient s/p mitral valve replacement on chronic anticoagulation with INR goal of 2.5-3.5.  -echo read: EF 60-65%, normally functioning prosthetic valve with mild MR -continue coumadin per pharmacy, with heparin bridge  Pseudogout: Patient reported history with Colchicine when in active flare. Not an active problem during this admission. Patient has no complaints of this at this time.  -continue to monitor   Hx of Prostate Cancer: Patient completed treatment years ago and follows with oncologist at [facility] annually. No urinary symptoms during this admission so far outside of scrotal swelling 2/2 to anasarca extension. -monitor for obstructive symptoms   FEN/GI: renal diet  PPx: warfarin per pharm, heparin bridging  Disposition: pending further work up  Subjective:  Patient feels better after paracentesis. No complaints.   Objective: Temp:  [97.7 F (36.5 C)-98.3 F (36.8 C)] 97.7 F (36.5 C) (09/20 0541) Pulse Rate:  [82-95] 95 (09/20 0541) Resp:  [18-20] 20 (09/20 0541) BP: (101-123)/(70-75) 101/70 (09/20 0541) SpO2:  [98 %-100 %] 98 % (09/20 0541) Weight:  [90.5 kg] 90.5 kg (09/20 0541)  Physical Exam: General: NAD, pleasant Cardiovascular: regular rate Respiratory: normal work of breathing Gastrointestinal: soft, nondistended MSK: moves 4 extremities equally, 3+ pitting edema in BLLE to waist, significant swelling of BLLE.  Derm: no rashes appreciated Neuro: CN II-XII grossly intact Psych: AOx3, appropriate affect  Laboratory: Recent Labs  Lab 12/01/17 1104 12/02/17 0651  WBC 3.7* 3.0*  HGB 12.9* 11.0*  HCT 37.8* 31.9*  PLT 193 150   Recent Labs  Lab 12/01/17 1104 12/02/17 0651  NA 131* 133*  K 4.7 4.6  CL 99 101  CO2 21* 23  BUN 33* 36*  CREATININE 3.12* 2.96*  CALCIUM 8.9 8.5*  PROT 5.7* 4.9*  BILITOT 0.7 1.1  ALKPHOS 80 68  ALT 58* 50*  AST 86* 79*  GLUCOSE 127* 104*   TSH: 9.619 Free T4: 1.25 Free T3:  2.2 A1c: 5.7 BNP: 45.1 Urea nitrogen 741 Protein/creatinine ratio, urine: 0.25 hepatitis panel negative  Imaging/Diagnostic Tests:  ECHO: Study Conclusions - Left ventricle: The cavity size was normal. Wall thickness was   normal. Systolic function was normal. The estimated ejection   fraction was in the range of 60% to 65%. - Mitral valve: MV posthesis is well seated. Appears to move well.   Peak and mean gradients through the valve are 11 and 4 mm Hg   respectively There is trival MR. - Right ventricle: Systolic function was mildly reduced. - Pericardium, extracardiac: There was a right pleural effusion.   There was a left pleural effusions Impressions: - Very poor acoustic windows limit study  Renal US: IMPRESSION: 1. Ultrasound appearance of the kidneys is within normal limits 2. Moderate to large amount of ascites in the abdomen. Left pleural effusion.  US Abdomen Limited Ruq  Result Date: 12/02/2017 CLINICAL DATA:  Distension with ascites EXAM: ULTRASOUND ABDOMEN LIMITED RIGHT UPPER QUADRANT COMPARISON:  12/01/2017 FINDINGS: Gallbladder: Small stones measuring up to 3 mm. Normal wall thickness. Negative sonographic Murphy Common bile duct: Diameter: 3.8 mm Liver: Subtle contour nodularity of the liver. No focal hepatic abnormality. Portal vein is patent on color Doppler imaging with normal direction of blood flow towards the liver. Small moderate ascites IMPRESSION: 1. Cholelithiasis without sonographic evidence for cholecystitis or biliary dilatation 2. Slight contour nodularity of the liver suggesting cirrhosis 3. Small to moderate ascites Electronically Signed   By: Donavan Foil M.D.   On: 12/02/2017 19:21    Dusty Wagoner, [place], DO 12/03/2017, 8:31 AM

## 2017-12-03 NOTE — Consult Note (Signed)
[facility] Consultation Note  Referring Provider: Dr. Erin Hearing (MTS) Primary Care Physician:  Patient, No Pcp Per Primary Gastroenterologist:  Dr. Anson Fret  Reason for Consultation:  ascites  HPI: Mario Powell is a 72 y.o. male with multiple medical problems as outlined below.  Admitted for one month history of abdominal distention and acute on chronic worsening of bilateral lower extremity edema.  + weight gain.  No abdominal pain, jaundice, pruritus, blood in stool, hematemesis.  No alcohol in two years; prior alcohol <1 drink per day on average.  No family history liver disease.  No prior endoscopy.   Past Medical History:  Diagnosis Date  . Anasarca 12/01/2017  . Atrial fibrillation ([place]) 07/12/2013   Perioperative, 2001   . CAD (coronary artery disease) of artery bypass graft 07/12/2013   Saphenous vein graft 2001   . Chronic anticoagulation 07/12/2013  . Essential hypertension 07/12/2013  . History of mitral valve replacement with mechanical valve 01/11/2013   Mechanical mitral valve 2001  Bacterial endocarditis as the cause   . HOH (hard of hearing)   . Prostate cancer ([place]) 07/12/2013    Past Surgical History:  Procedure Laterality Date  . CORONARY ARTERY BYPASS GRAFT     2001  . MITRAL VALVE REPLACEMENT  2001   Mechanical prosthesis    Prior to Admission medications   Medication Sig Start Date End Date Taking? Authorizing Provider  aspirin 81 MG tablet Take 81 mg by mouth daily.   Yes [provider]  ezetimibe (ZETIA) 10 MG tablet Take 1 tablet (10 mg total) by mouth daily. 01/11/17  Yes Belva Crome, MD  hydrochlorothiazide (HYDRODIURIL) 25 MG tablet Take 1 tablet (25 mg total) by mouth daily. 01/11/17  Yes Belva Crome, MD  niacin (NIASPAN) 1000 MG CR tablet Take 1 tablet (1,000 mg total) by mouth at bedtime. 01/11/17  Yes Belva Crome, MD  ramipril (ALTACE) 10 MG capsule Take 1 capsule (10 mg total) by mouth 2 (two) times daily. 01/11/17   Yes Belva Crome, MD  rosuvastatin (CRESTOR) 40 MG tablet Take 1 tablet (40 mg total) by mouth daily. 01/11/17  Yes Belva Crome, MD  warfarin (COUMADIN) 1 MG tablet TAKE AS DIRECTED BY ANTICOAGULATION CLINIC Patient taking differently: Take 1 mg by mouth See admin instructions. Take 1 tablet (1MG ) by mouth daily except on Mon and Fridays take 1/2 tablet (0.5MG ). 07/27/17  Yes Belva Crome, MD    Current Facility-Administered Medications  Medication Dose Route Frequency Provider Last Rate Last Dose  . furosemide (LASIX) injection 80 mg  80 mg Intravenous Once Orson Eva J, DO      . lidocaine (XYLOCAINE) 1 % (with pres) injection           . polyethylene glycol (MIRALAX / GLYCOLAX) packet 17 g  17 g Oral Daily PRN Bufford Lope, DO      . Warfarin - Pharmacist Dosing Inpatient   Does not apply q1800 [place], Valeda Malm, [facility]   Stopped at 12/02/17 1800    Allergies as of 12/01/2017 - Review Complete 12/01/2017  Allergen Reaction Noted  . Penicillins Other (See Comments) 07/12/2013    Family History  Problem Relation Age of Onset  . Heart attack Father   . Healthy Mother   . Healthy Sister   . Healthy Sister     Social History   Socioeconomic History  . Marital status: Widowed    Spouse name: Not on file  .  Number of children: Not on file  . Years of education: Not on file  . Highest education level: Not on file  Occupational History  . Not on file  Social Needs  . Financial resource strain: Not on file  . Food insecurity:    Worry: Not on file    Inability: Not on file  . Transportation needs:    Medical: Not on file    Non-medical: Not on file  Tobacco Use  . Smoking status: Former Research scientist (life sciences)  . Smokeless tobacco: Never Used  . Tobacco comment: QUIT IN 09/1982  Substance and Sexual Activity  . Alcohol use: Not Currently    Comment: NO ALCOHOL SINCE CHRISTMAS 2017  . Drug use: Never  . Sexual activity: Not on file  Lifestyle  . Physical activity:    Days per  week: Not on file    Minutes per session: Not on file  . Stress: Not on file  Relationships  . Social connections:    Talks on phone: Not on file    Gets together: Not on file    Attends religious service: Not on file    Active member of club or organization: Not on file    Attends meetings of clubs or organizations: Not on file    Relationship status: Not on file  . Intimate partner violence:    Fear of current or ex partner: Not on file    Emotionally abused: Not on file    Physically abused: Not on file    Forced sexual activity: Not on file  Other Topics Concern  . Not on file  Social History Narrative  . Not on file    Review of Systems: As per HPI, all others negative  Physical Exam: Vital signs in last 24 hours: Temp:  [97.7 F (36.5 C)-98.1 F (36.7 C)] 98 F (36.7 C) (09/20 0831) Pulse Rate:  [82-95] 86 (09/20 0831) Resp:  [18-20] 18 (09/20 0831) BP: (100-123)/(66-75) 100/66 (09/20 0831) SpO2:  [98 %-100 %] 100 % (09/20 0831) Weight:  [90.5 kg] 90.5 kg (09/20 0541) Last BM Date: 12/02/17 General:   Alert,  Well-developed, well-nourished, pleasant and cooperative in NAD Head:  Normocephalic and atraumatic. Eyes:  Sclera clear, no icterus.   Conjunctiva pink. Ears:  Presbyacusis; bilateral hearing aids Nose:  No deformity, discharge,  or lesions. Mouth:  No deformity or lesions.  Oropharynx pink & moist. Neck:  Supple; no masses or thyromegaly. Lungs:  Clear throughout to auscultation.   No wheezes, crackles, or rhonchi. No acute distress. Heart:  Irregularly irregular rate and rhythm; III/VI SEM apex; audible metallic valve click; no rubs or gallops. Abdomen:  Soft, mild distention, +HM 2cm below right costal margin along midclavicular line. No masses or hernias noted. Normal bowel sounds, without guarding, and without rebound.     Msk:  Symmetrical without gross deformities. Normal posture. Pulses:  Normal pulses noted. Extremities:  3+ edema RLE below knees  with ecchymosis; 2+ edema LLE below knees with ecchymosis. Neurologic:  Alert and  oriented x4;  grossly normal neurologically. Skin:  Intact without significant lesions or rashes. Cervical Nodes:  No significant cervical adenopathy. Psych:  Alert and cooperative. Normal mood and affect.   Lab Results: Recent Labs    12/01/17 1104 12/02/17 0651 12/03/17 0707  WBC 3.7* 3.0* 2.7*  HGB 12.9* 11.0* 10.2*  HCT 37.8* 31.9* 30.3*  PLT 193 150 142*   BMET Recent Labs    12/01/17 1104 12/02/17 0651 12/03/17 1610  NA 131* 133* 131*  K 4.7 4.6 4.3  CL 99 101 103  CO2 21* 23 21*  GLUCOSE 127* 104* 109*  BUN 33* 36* 34*  CREATININE 3.12* 2.96* 2.71*  CALCIUM 8.9 8.5* 8.4*   LFT Recent Labs    12/01/17 1104  12/03/17 0707  PROT 5.7*   < > 4.6*  ALBUMIN 2.7*   < > 2.3*  AST 86*   < > 53*  ALT 58*   < > 37  ALKPHOS 80   < > 55  BILITOT 0.7   < > 0.8  BILIDIR 0.2  --   --   IBILI 0.5  --   --    < > = values in this interval not displayed.   PT/INR Recent Labs    12/02/17 0651 12/03/17 0707  LABPROT 42.2* 22.1*  INR 4.47* 1.95    Studies/Results: US Renal  Result Date: 12/01/2017 CLINICAL DATA:  Acute kidney injury EXAM: RENAL / URINARY TRACT ULTRASOUND COMPLETE COMPARISON:  CT 11/22/2006 FINDINGS: Right Kidney: Length: 9.2 cm. Echogenicity within normal limits. No mass or hydronephrosis visualized. Left Kidney: Length: 7.4 cm. Echogenicity within normal limits. No mass or hydronephrosis visualized. Bladder: Appears normal for degree of bladder distention. Moderate to large amount of free fluid in the abdomen. Incidental note made of left pleural effusion. IMPRESSION: 1. Ultrasound appearance of the kidneys is within normal limits 2. Moderate to large amount of ascites in the abdomen. Left pleural effusion. Electronically Signed   By: Donavan Foil Powell.D.   On: 12/01/2017 19:49   US Abdomen Limited Ruq  Result Date: 12/02/2017 CLINICAL DATA:  Distension with ascites EXAM:  ULTRASOUND ABDOMEN LIMITED RIGHT UPPER QUADRANT COMPARISON:  12/01/2017 FINDINGS: Gallbladder: Small stones measuring up to 3 mm. Normal wall thickness. Negative sonographic Murphy Common bile duct: Diameter: 3.8 mm Liver: Subtle contour nodularity of the liver. No focal hepatic abnormality. Portal vein is patent on color Doppler imaging with normal direction of blood flow towards the liver. Small moderate ascites IMPRESSION: 1. Cholelithiasis without sonographic evidence for cholecystitis or biliary dilatation 2. Slight contour nodularity of the liver suggesting cirrhosis 3. Small to moderate ascites Electronically Signed   By: Donavan Foil Powell.D.   On: 12/02/2017 19:21   Impression:  1.  Volume overload:  Edema legs/arms as well as ascites.  Suspect chronic venous stasis lower extremities superimposed upon more acute process.  Cirrhosis is leading contender.  ECHO showed no significant valvular regurgitation with good EF, making cardiac ascites very unlikely. 2.  Ascites.  Suspect from cirrhosis. 3.  Abnormal imaging:  Nodular liver, suspect cirrhosis.   4.  Metal mitral valve replacement. 5.  Chronic anticoagulation, warfarin, for #4 above. 6.  Acute kidney injury.  Improving.  Doubt HRS.  Plan:  1.  Paracentesis done today; will await fluid studies, especially SAAG, which will help corroborate clinical suspicion for cirrhosis. 2.  Be very careful with diuretics, for fear of exacerbating renal insufficiency. 3.  Low sodium diet. 4.  Needs EGD at some point for variceal screening; might be best to do as inpatient, since warfarin has already been held, and will need to be restarted prior to hospital discharge. 5.  Work-up for cirrhosis (etiology, hepatoma screening, surveillance for HE) can be pursued as outpatient with Dr. Penelope Coop, patient's gastroenterologist. 6.  [facility] will revisit tomorrow.   LOS: 2 days   Mario Powell  12/03/2017, 12:55 PM  Cell [phone] If no answer or after 5  PM  call [phone]

## 2017-12-04 ENCOUNTER — Inpatient Hospital Stay (HOSPITAL_COMMUNITY): Payer: Medicare Other

## 2017-12-04 ENCOUNTER — Encounter (HOSPITAL_COMMUNITY): Payer: Self-pay | Admitting: Family Medicine

## 2017-12-04 DIAGNOSIS — D61818 Other pancytopenia: Secondary | ICD-10-CM

## 2017-12-04 LAB — COMPREHENSIVE METABOLIC PANEL
ALK PHOS: 55 U/L (ref 38–126)
ALT: 34 U/L (ref 0–44)
ANION GAP: 7 (ref 5–15)
AST: 42 U/L — ABNORMAL HIGH (ref 15–41)
Albumin: 2.2 g/dL — ABNORMAL LOW (ref 3.5–5.0)
BUN: 32 mg/dL — ABNORMAL HIGH (ref 8–23)
CALCIUM: 8.4 mg/dL — AB (ref 8.9–10.3)
CHLORIDE: 100 mmol/L (ref 98–111)
CO2: 26 mmol/L (ref 22–32)
CREATININE: 2.53 mg/dL — AB (ref 0.61–1.24)
GFR calc Af Amer: 27 mL/min — ABNORMAL LOW (ref >60)
GFR, EST NON AFRICAN AMERICAN: 24 mL/min — AB (ref >60)
Glucose, Bld: 122 mg/dL — ABNORMAL HIGH (ref 70–99)
Potassium: 4.1 mmol/L (ref 3.5–5.1)
Sodium: 133 mmol/L — ABNORMAL LOW (ref 135–145)
Total Bilirubin: 0.9 mg/dL (ref 0.3–1.2)
Total Protein: 4.6 g/dL — ABNORMAL LOW (ref 6.5–8.1)

## 2017-12-04 LAB — DIFFERENTIAL
Abs Immature Granulocytes: 0 10*3/uL (ref 0.0–0.1)
BASOS ABS: 0 10*3/uL (ref 0.0–0.1)
BASOS PCT: 1 %
EOS ABS: 0.1 10*3/uL (ref 0.0–0.7)
EOS PCT: 4 %
IMMATURE GRANULOCYTES: 0 %
LYMPHS ABS: 0.2 10*3/uL — AB (ref 0.7–4.0)
Lymphocytes Relative: 9 %
Monocytes Absolute: 0.3 10*3/uL (ref 0.1–1.0)
Monocytes Relative: 14 %
Neutro Abs: 1.5 10*3/uL — ABNORMAL LOW (ref 1.7–7.7)
Neutrophils Relative %: 72 %

## 2017-12-04 LAB — CBC
HCT: 30.7 % — ABNORMAL LOW (ref 39.0–52.0)
Hemoglobin: 10.5 g/dL — ABNORMAL LOW (ref 13.0–17.0)
MCH: 33.9 pg (ref 26.0–34.0)
MCHC: 34.2 g/dL (ref 30.0–36.0)
MCV: 99 fL (ref 78.0–100.0)
PLATELETS: 123 10*3/uL — AB (ref 150–400)
RBC: 3.1 MIL/uL — AB (ref 4.22–5.81)
RDW: 12.9 % (ref 11.5–15.5)
WBC: 2 10*3/uL — ABNORMAL LOW (ref 4.0–10.5)

## 2017-12-04 LAB — HEPARIN LEVEL (UNFRACTIONATED): HEPARIN UNFRACTIONATED: 0.73 [IU]/mL — AB (ref 0.30–0.70)

## 2017-12-04 LAB — PROTIME-INR
INR: 1.61
PROTHROMBIN TIME: 19 s — AB (ref 11.4–15.2)

## 2017-12-04 MED ORDER — FUROSEMIDE 10 MG/ML IJ SOLN
80.0000 mg | Freq: Once | INTRAMUSCULAR | Status: AC
  Administered 2017-12-04: 80 mg via INTRAVENOUS
  Filled 2017-12-04: qty 8

## 2017-12-04 MED ORDER — WARFARIN SODIUM 1 MG PO TABS
1.0000 mg | ORAL_TABLET | Freq: Once | ORAL | Status: AC
  Administered 2017-12-04: 1 mg via ORAL
  Filled 2017-12-04: qty 1

## 2017-12-04 NOTE — Progress Notes (Addendum)
Family Medicine Teaching Service Daily Progress Note Intern Pager: [phone]  Patient name: Mario Powell Medical record number: [id number] Date of birth: 06/02/1945 Age: 72 y.o. Gender: male  Primary Care Provider: Patient, No Pcp Per Consultants: none Code Status: Full Code   Pt Overview and Major Events to Date:  9/18 admitted with anasarca and AKI 9/20 paracentesis  Assessment and Plan: Mario Powell is a 72 year old male with progressive anasarca. PMH is significant for Afib, MV replacement with mechanical valve on Coumadin, CAD s/p CABG, HTN, h/o prostate cancer.   Anasarca with ascites, unclear etiology. S/p paracentesis, removed 5L chylous fluid. SAAG 0.5, neutrophils 21, WBC 157 in peritoneal fluid. Most concerning for malignancy. Also considered diuretic resistant cirrhosis since initially did not respond to IV lasix 40mg  but seems to have responded well to IV lasix 80mg  yesterday with UOP 1.7L in last 24h so less likely. Hepatitis panel negative. Unlikely to be from CHF even though he has a mechanical valve as echo showed EF 60-65% with trivial MR.  - [facility] following, appreciate recommendations. Plan for possible EGD/colonoscopy on 9/23. Start patient on clear liquid diet tomorrow.  - CT abd wo contrast to evaluate liver, unfortunately kidney function limits ability for IV contrast - holding hepatotoxic agents  - strict I/Os, daily weights - f/u peritoneal Cx - f/u peritoneal triglycerides - additional IV lasix 80mg  today  Pancytopenia. Has been leukopenic with WBC 2.0, hgb 10.5 and plt mildly decreased at 123 today.  - adding on diff to CBC today  - monitor CBC  AKI, improving: Cr down to 2.53 today from peak 3.12 (baseline 1.1) - monitor Cr closely  CAD s/p CABG, stable.  - holding home ASA81 d/t AKI  Mechanical Mitral Valve Replacement: On coumadin at home. INR goal of 2.5-3.5, reversed with Vit K 2.5mg  x1 on 12/02/17 for procedure. INR 1.61 - heparin bridge, will  need to hold Monday for procedure  Atrial Fibrillation, stable. Rate controlled. - heparin bridge as per above  Hx of Prostate Cancer: Patient completed treatment years ago and follows with oncologist at [facility] annually. No urinary symptoms during this admission so far outside of scrotal swelling 2/2 to anasarca extension. -monitor for obstructive symptoms   FEN/GI: renal diet  PPx: heparin bridge, plan to coumadin after procedure  Disposition: pending further work up  Subjective:  Feels well this morning. States that his abdomen is back down to his usual nondistended appearance but continues to have leg swelling that is slightly improved from his hospital admission. He denies any nausea/vomiting or abdominal pain. No other concerns today. Daughter at bedside this morning is appreciative of updates.  Objective: Temp:  [97.8 F (36.6 C)-98.1 F (36.7 C)] 98.1 F (36.7 C) (09/21 0421) Pulse Rate:  [72-86] 72 (09/21 0421) Resp:  [16-18] 16 (09/21 0421) BP: (100-121)/(59-67) 121/59 (09/21 0421) SpO2:  [98 %-100 %] 98 % (09/21 0421)  Physical Exam: General: sitting up in bed eating breakfast, in NAD, hearing aids in place Cardiovascular:  Regular rate with a sharp click of S1, normal S2 Respiratory: CTAB, normal work of breathing on room air Gastrointestinal: soft, nontender, nondistended, + bowel sounds, No appreciable hepatomegaly MSK: moving all limbs equally.  3+ pitting edema in bilateral LE  Derm: no rashes Neuro: alert and awake, grossly normal apart from hearing issues Psych: appropriate affect   Laboratory: Recent Labs  Lab 12/02/17 0651 12/03/17 0707 12/04/17 0517  WBC 3.0* 2.7* 2.0*  HGB 11.0* 10.2* 10.5*  HCT 31.9* 30.3* 30.7*  PLT 150 142* 123*   Recent Labs  Lab 12/02/17 0651 12/03/17 0707 12/04/17 0517  NA 133* 131* 133*  K 4.6 4.3 4.1  CL 101 103 100  CO2 23 21* 26  BUN 36* 34* 32*  CREATININE 2.96* 2.71* 2.53*  CALCIUM 8.5* 8.4* 8.4*  PROT  4.9* 4.6* 4.6*  BILITOT 1.1 0.8 0.9  ALKPHOS 68 55 55  ALT 50* 37 34  AST 79* 53* 42*  GLUCOSE 104* 109* 122*   TSH: 9.619 Free T4: 1.25 Free T3: 2.2 A1c: 5.7 BNP: 45.1 Urea nitrogen 741 Protein/creatinine ratio, urine: 0.25 hepatitis panel negative  Peritoneal fluid: albumin 1.8, LDH 126, WBC 157, neutrophils 21  Imaging/Diagnostic Tests:  ECHO: Study Conclusions - Left ventricle: The cavity size was normal. Wall thickness was   normal. Systolic function was normal. The estimated ejection   fraction was in the range of 60% to 65%. - Mitral valve: MV posthesis is well seated. Appears to move well.   Peak and mean gradients through the valve are 11 and 4 mm Hg   respectively There is trival MR. - Right ventricle: Systolic function was mildly reduced. - Pericardium, extracardiac: There was a right pleural effusion.   There was a left pleural effusions Impressions: - Very poor acoustic windows limit study  Renal US: IMPRESSION: 1. Ultrasound appearance of the kidneys is within normal limits 2. Moderate to large amount of ascites in the abdomen. Left pleural effusion.  Ir Paracentesis  Result Date: 12/03/2017 INDICATION: Patient with abdominal distention, found to have ascites. Request is made for diagnostic and therapeutic paracentesis. Due to renal insufficiency will limit removal to 5 liters today. EXAM: ULTRASOUND GUIDED DIAGNOSTIC AND THERAPEUTIC PARACENTESIS MEDICATIONS: 10 mL 1% lidocaine COMPLICATIONS: None immediate. PROCEDURE: Informed written consent was obtained from the patient after a discussion of the risks, benefits and alternatives to treatment. A timeout was performed prior to the initiation of the procedure. Initial ultrasound scanning demonstrates a large amount of ascites within the right lower abdominal quadrant. The right lower abdomen was prepped and draped in the usual sterile fashion. 1% lidocaine was used for local anesthesia. Following this, a 6 Fr  Safe-T-Centesis catheter was introduced. An ultrasound image was saved for documentation purposes. The paracentesis was performed. The catheter was removed and a dressing was applied. The patient tolerated the procedure well without immediate post procedural complication. FINDINGS: A total of approximately 5.0 liters of chylous-appearing fluid was removed. Samples were sent to the laboratory as requested by the clinical team. IMPRESSION: Successful ultrasound-guided diagnostic and therapeutic paracentesis yielding 5.0 liters of peritoneal fluid. Read by: Brynda Greathouse PA-C Electronically Signed   By: Aletta Edouard M.D.   On: 12/03/2017 14:59    Bufford Lope, DO PGY-3, [facility] 12/04/2017 11:19 AM

## 2017-12-04 NOTE — Progress Notes (Addendum)
ANTICOAGULATION CONSULT NOTE - Follow-Up  Pharmacy Consult for Heparin + Warfarin Indication: mechanical MVR  Allergies  Allergen Reactions  . Penicillins Other (See Comments)    REACTION: UNKNOWN    Patient Measurements: Height: 5\' 11"  (180.3 cm) Weight: 199 lb 8 oz (90.5 kg) IBW/kg (Calculated) : 75.3  Ht: 5'11" IBW 75.3 kg Heparin Dosing Wt: 90.5 kg  Vital Signs: Temp: 98 F (36.7 C) (09/21 0817) Temp Source: Oral (09/21 0817) BP: 117/70 (09/21 0817) Pulse Rate: 73 (09/21 0817)  Labs: Recent Labs    12/02/17 0651 12/03/17 0707 12/03/17 2032 12/04/17 0517  HGB 11.0* 10.2*  --  10.5*  HCT 31.9* 30.3*  --  30.7*  PLT 150 142*  --  123*  LABPROT 42.2* 22.1*  --  19.0*  INR 4.47* 1.95  --  1.61  HEPARINUNFRC  --   --  0.25*  --   CREATININE 2.96* 2.71*  --  2.53*    Estimated Creatinine Clearance: 29.9 mL/min (A) (by C-G formula based on SCr of 2.53 mg/dL (H)).   Medical History: Past Medical History:  Diagnosis Date  . Anasarca 12/01/2017  . Atrial fibrillation ([place]) 07/12/2013   Perioperative, 2001   . CAD (coronary artery disease) of artery bypass graft 07/12/2013   Saphenous vein graft 2001   . Chronic anticoagulation 07/12/2013  . Essential hypertension 07/12/2013  . History of mitral valve replacement with mechanical valve 01/11/2013   Mechanical mitral valve 2001  Bacterial endocarditis as the cause   . HOH (hard of hearing)   . Prostate cancer ([place]) 07/12/2013   Assessment: 53 YOM presented to the ED with leg swelling. He is on chronic warfarin for history of mechanical MVR. INR is supratherapeutic on admit at 3.87 on PTA dose of 1 mg daily EXCEPT for 0.5 mg on Mon/Fri. INR reversed with Vit K 2.5 mg IV x 1 on 9/19 for paracentesis 9/20. Pharmacy consulted to start a heparin bridge and resume warfarin post-op.   The patient got paracentesis  - so Heparin initiation delayed until post-procedure. IR okay with resuming post-op as long as there is no oozing  - which RN reports none.   INR today is SUBtherapeutic 1.61 (goal of 2.5-3.5). Hgb/Hct slight drop, plts 123. Original plan was to hold coumadin until EGD but GI stated ok to resume coumadin. Family medicine is aware.   Initial heparin level = 0.25  Goal of Therapy:  Heparin level 0.3-0.7 units/ml INR 2.5-3.5 Monitor platelets by anticoagulation protocol: Yes   Plan:  -Heparin to 1400 units / hr - Resume coumadin 1mg  PO x1  - F/u with heparin level today  Onnie Boer, PharmD, [place] Samples, AAHIVP, CPP Infectious Disease Pharmacist Pager: [phone] 12/04/2017 12:21 PM   Addendum  Heparin level came back at 0.73.   Decrease heparin to 1350 units/hr F/u with 8 hr level  Onnie Boer, PharmD, [place], [place], CPP Infectious Disease Pharmacist Pager: [phone] 12/04/2017 3:02 PM

## 2017-12-04 NOTE — Progress Notes (Signed)
[facility] PROGRESS NOTE Subjective Patient seems to be doing well.  Had paracentesis yesterday, approximately 5 L removed. SAAG 0.5, milky fluid no organisms WBC not very high  Objective: Vital signs in last 24 hours: Temp:  [97.8 F (36.6 C)-98.1 F (36.7 C)] 98.1 F (36.7 C) (09/21 0421) Pulse Rate:  [72-86] 72 (09/21 0421) Resp:  [16-18] 16 (09/21 0421) BP: (100-121)/(59-67) 121/59 (09/21 0421) SpO2:  [98 %-100 %] 98 % (09/21 0421) Last BM Date: 12/03/17  Intake/Output from previous day: 09/20 0701 - 09/21 0700 In: 748.2 [P.O.:600; I.V.:148.2] Out: 2500 [Urine:2500] Intake/Output this shift: Total I/O In: 483 [P.O.:360; I.V.:123] Out: 2500 [Urine:2500]  PE: General--alert no acute distress  Lungs--clear Abdomen--soft flat nontender no gross ascites  Lab Results: Recent Labs    12/01/17 1104 12/02/17 0651 12/03/17 0707 12/04/17 0517  WBC 3.7* 3.0* 2.7* 2.0*  HGB 12.9* 11.0* 10.2* 10.5*  HCT 37.8* 31.9* 30.3* 30.7*  PLT 193 150 142* 123*   BMET Recent Labs    12/01/17 1104 12/02/17 0651 12/03/17 0707 12/04/17 0517  NA 131* 133* 131* 133*  K 4.7 4.6 4.3 4.1  CL 99 101 103 100  CO2 21* 23 21* 26  CREATININE 3.12* 2.96* 2.71* 2.53*   LFT Recent Labs    12/01/17 1104 12/02/17 0651 12/03/17 0707 12/04/17 0517  PROT 5.7* 4.9* 4.6* 4.6*  AST 86* 79* 53* 42*  ALT 58* 50* 37 34  ALKPHOS 80 68 55 55  BILITOT 0.7 1.1 0.8 0.9  BILIDIR 0.2  --   --   --   IBILI 0.5  --   --   --    PT/INR Recent Labs    12/02/17 0651 12/03/17 0707 12/04/17 0517  LABPROT 42.2* 22.1* 19.0*  INR 4.47* 1.95 1.61   PANCREAS No results for input(s): LIPASE in the last 72 hours.       Studies/Results: US Abdomen Limited Ruq  Result Date: 12/02/2017 CLINICAL DATA:  Distension with ascites EXAM: ULTRASOUND ABDOMEN LIMITED RIGHT UPPER QUADRANT COMPARISON:  12/01/2017 FINDINGS: Gallbladder: Small stones measuring up to 3 mm. Normal wall thickness.  Negative sonographic Murphy Common bile duct: Diameter: 3.8 mm Liver: Subtle contour nodularity of the liver. No focal hepatic abnormality. Portal vein is patent on color Doppler imaging with normal direction of blood flow towards the liver. Small moderate ascites IMPRESSION: 1. Cholelithiasis without sonographic evidence for cholecystitis or biliary dilatation 2. Slight contour nodularity of the liver suggesting cirrhosis 3. Small to moderate ascites Electronically Signed   By: Donavan Foil M.D.   On: 12/02/2017 19:21   Ir Paracentesis  Result Date: 12/03/2017 INDICATION: Patient with abdominal distention, found to have ascites. Request is made for diagnostic and therapeutic paracentesis. Due to renal insufficiency will limit removal to 5 liters today. EXAM: ULTRASOUND GUIDED DIAGNOSTIC AND THERAPEUTIC PARACENTESIS MEDICATIONS: 10 mL 1% lidocaine COMPLICATIONS: None immediate. PROCEDURE: Informed written consent was obtained from the patient after a discussion of the risks, benefits and alternatives to treatment. A timeout was performed prior to the initiation of the procedure. Initial ultrasound scanning demonstrates a large amount of ascites within the right lower abdominal quadrant. The right lower abdomen was prepped and draped in the usual sterile fashion. 1% lidocaine was used for local anesthesia. Following this, a 6 Fr Safe-T-Centesis catheter was introduced. An ultrasound image was saved for documentation purposes. The paracentesis was performed. The catheter was removed and a dressing was applied. The patient tolerated the procedure well without immediate post procedural complication.  FINDINGS: A total of approximately 5.0 liters of chylous-appearing fluid was removed. Samples were sent to the laboratory as requested by the clinical team. IMPRESSION: Successful ultrasound-guided diagnostic and therapeutic paracentesis yielding 5.0 liters of peritoneal fluid. Read by: Brynda Greathouse PA-C  Electronically Signed   By: Aletta Edouard M.D.   On: 12/03/2017 14:59    Medications: I have reviewed the patient's current medications.  Assessment:   1.  Anasarca.  Etiology is unclear.  He does have a mechanical heart valve and could have some congestive heart failure.  The ascitic fluid is not clearly show portal hypertension.SAAG 0.5 which is not necessarily consistent with portal hypertension also the milky color the fluid could indicate some kind of leakage.   Plan: Could probably go ahead and resume his anticoagulation.  He could follow-up for further work-up as outpatient with Dr Penelope Coop.  Would consider CT scan Of the liver, however creatinine elevated would make contrast difficult.  Ultrasound somewhat abnormal but was not classic for cirrhosis. [facility] will check on Monday   Nancy Fetter 12/04/2017, 6:35 AM  This note was created using voice recognition software. Minor errors may Have occurred unintentionally.  Pager: [phone] If no answer or after hours call [phone]

## 2017-12-05 LAB — CBC WITH DIFFERENTIAL/PLATELET
Abs Immature Granulocytes: 0 10*3/uL (ref 0.0–0.1)
BASOS PCT: 1 %
Basophils Absolute: 0 10*3/uL (ref 0.0–0.1)
EOS ABS: 0.1 10*3/uL (ref 0.0–0.7)
EOS PCT: 5 %
HEMATOCRIT: 29 % — AB (ref 39.0–52.0)
Hemoglobin: 10 g/dL — ABNORMAL LOW (ref 13.0–17.0)
Immature Granulocytes: 0 %
LYMPHS ABS: 0.3 10*3/uL — AB (ref 0.7–4.0)
Lymphocytes Relative: 10 %
MCH: 33.8 pg (ref 26.0–34.0)
MCHC: 34.5 g/dL (ref 30.0–36.0)
MCV: 98 fL (ref 78.0–100.0)
MONO ABS: 0.5 10*3/uL (ref 0.1–1.0)
MONOS PCT: 18 %
NEUTROS PCT: 66 %
Neutro Abs: 1.8 10*3/uL (ref 1.7–7.7)
PLATELETS: 129 10*3/uL — AB (ref 150–400)
RBC: 2.96 MIL/uL — ABNORMAL LOW (ref 4.22–5.81)
RDW: 12.9 % (ref 11.5–15.5)
WBC: 2.7 10*3/uL — ABNORMAL LOW (ref 4.0–10.5)

## 2017-12-05 LAB — COMPREHENSIVE METABOLIC PANEL
ALT: 25 U/L (ref 0–44)
AST: 33 U/L (ref 15–41)
Albumin: 2.1 g/dL — ABNORMAL LOW (ref 3.5–5.0)
Alkaline Phosphatase: 48 U/L (ref 38–126)
Anion gap: 10 (ref 5–15)
BUN: 32 mg/dL — AB (ref 8–23)
CHLORIDE: 97 mmol/L — AB (ref 98–111)
CO2: 26 mmol/L (ref 22–32)
CREATININE: 2.71 mg/dL — AB (ref 0.61–1.24)
Calcium: 8.1 mg/dL — ABNORMAL LOW (ref 8.9–10.3)
GFR, EST AFRICAN AMERICAN: 25 mL/min — AB (ref >60)
GFR, EST NON AFRICAN AMERICAN: 22 mL/min — AB (ref >60)
Glucose, Bld: 125 mg/dL — ABNORMAL HIGH (ref 70–99)
POTASSIUM: 3.6 mmol/L (ref 3.5–5.1)
Sodium: 133 mmol/L — ABNORMAL LOW (ref 135–145)
TOTAL PROTEIN: 4.5 g/dL — AB (ref 6.5–8.1)
Total Bilirubin: 0.7 mg/dL (ref 0.3–1.2)

## 2017-12-05 LAB — PROTIME-INR
INR: 1.74
PROTHROMBIN TIME: 20.2 s — AB (ref 11.4–15.2)

## 2017-12-05 LAB — HEPARIN LEVEL (UNFRACTIONATED)
HEPARIN UNFRACTIONATED: 0.5 [IU]/mL (ref 0.30–0.70)
Heparin Unfractionated: 0.53 IU/mL (ref 0.30–0.70)

## 2017-12-05 LAB — TRIGLYCERIDES, BODY FLUIDS: Triglycerides, Fluid: 785 mg/dL

## 2017-12-05 MED ORDER — WARFARIN SODIUM 1 MG PO TABS
1.5000 mg | ORAL_TABLET | Freq: Once | ORAL | Status: DC
  Filled 2017-12-05: qty 1

## 2017-12-05 MED ORDER — PEG 3350-KCL-NA BICARB-NACL 420 G PO SOLR
4000.0000 mL | Freq: Once | ORAL | Status: AC
  Administered 2017-12-05: 4000 mL via ORAL
  Filled 2017-12-05: qty 4000

## 2017-12-05 NOTE — Progress Notes (Signed)
ANTICOAGULATION CONSULT NOTE - Follow-Up  Pharmacy Consult for Heparin + Warfarin Indication: mechanical MVR  Allergies  Allergen Reactions  . Penicillins Other (See Comments)    REACTION: UNKNOWN    Patient Measurements: Height: 5\' 11"  (180.3 cm) Weight: 180 lb 1.9 oz (81.7 kg) IBW/kg (Calculated) : 75.3  Ht: 5'11" IBW 75.3 kg Heparin Dosing Wt: 90.5 kg  Vital Signs: Temp: 97.6 F (36.4 C) (09/22 0830) Temp Source: Oral (09/22 0830) BP: 114/68 (09/22 0830) Pulse Rate: 80 (09/22 0830)  Labs: Recent Labs    12/03/17 0707  12/04/17 0517 12/04/17 1229 12/04/17 2320 12/05/17 0341  HGB 10.2*  --  10.5*  --   --  10.0*  HCT 30.3*  --  30.7*  --   --  29.0*  PLT 142*  --  123*  --   --  129*  LABPROT 22.1*  --  19.0*  --   --  20.2*  INR 1.95  --  1.61  --   --  1.74  HEPARINUNFRC  --    < >  --  0.73* 0.50 0.53  CREATININE 2.71*  --  2.53*  --   --  2.71*   < > = values in this interval not displayed.    Estimated Creatinine Clearance: 25.9 mL/min (A) (by C-G formula based on SCr of 2.71 mg/dL (H)).   Medical History: Past Medical History:  Diagnosis Date  . Anasarca 12/01/2017  . Atrial fibrillation ([place]) 07/12/2013   Perioperative, 2001   . CAD (coronary artery disease) of artery bypass graft 07/12/2013   Saphenous vein graft 2001   . Chronic anticoagulation 07/12/2013  . Essential hypertension 07/12/2013  . History of mitral valve replacement with mechanical valve 01/11/2013   Mechanical mitral valve 2001  Bacterial endocarditis as the cause   . HOH (hard of hearing)   . Prostate cancer ([place]) 07/12/2013   Assessment: 44 YOM presented to the ED with leg swelling. He is on chronic warfarin for history of mechanical MVR. INR is supratherapeutic on admit at 3.87 on PTA dose of 1 mg daily EXCEPT for 0.5 mg on Mon/Fri. INR reversed with Vit K 2.5 mg IV x 1 on 9/19 for paracentesis 9/20. Pharmacy consulted to start a heparin bridge and resume warfarin post-op.   The  patient got paracentesis  - so Heparin initiation delayed until post-procedure. IR okay with resuming post-op as long as there is no oozing - which RN reports none.   INR today is SUBtherapeutic 1.74 (goal of 2.5-3.5). Hgb/Hct slight drop, plts 129. Original plan was to hold coumadin until EGD but GI stated ok to resume coumadin. Family medicine is aware. We will try a slightly higher dose today because of the vit k recently.   Initial heparin level = 0.53  Goal of Therapy:  Heparin level 0.3-0.7 units/ml INR 2.5-3.5 Monitor platelets by anticoagulation protocol: Yes   Plan:   - Cont Heparin to 1300 units / hr - Coumadin 1.5mg  PO x1  - Daily HL and INR  Onnie Boer, PharmD, [place], AAHIVP, CPP Infectious Disease Pharmacist Pager: [phone] 12/05/2017 11:51 AM

## 2017-12-05 NOTE — Progress Notes (Signed)
ANTICOAGULATION CONSULT NOTE - Follow Up Consult  Pharmacy Consult for heparin Indication: MVR  Labs: Recent Labs    12/02/17 0651 12/03/17 0707 12/03/17 2032 12/04/17 0517 12/04/17 1229 12/04/17 2320  HGB 11.0* 10.2*  --  10.5*  --   --   HCT 31.9* 30.3*  --  30.7*  --   --   PLT 150 142*  --  123*  --   --   LABPROT 42.2* 22.1*  --  19.0*  --   --   INR 4.47* 1.95  --  1.61  --   --   HEPARINUNFRC  --   --  0.25*  --  0.73* 0.50  CREATININE 2.96* 2.71*  --  2.53*  --   --     Assessment/Plan:  72yo male therapeutic on heparin after rate changes. Will continue gtt at current rate and confirm stable with am labs.   Wynona Neat, PharmD, BCPS  12/05/2017,1:11 AM

## 2017-12-05 NOTE — Progress Notes (Signed)
[facility] PROGRESS NOTE Subjective Patient continues to be confused.  He is being diuresed.  His daughter is in the room today and to give the additional history that he has had swelling of his lower extremities ever since his CABG approximately 20 years ago which was attributed to removal of the saphenous vein for grafting.  He is never had problems with his creatinine or kidneys.  He does have a mechanical heart valve since 2001 following an episode of endocarditis.  We did do the CT scan without contrast which did not clearly show cirrhosis of the liver.  He has no significant risk factors with minimal alcohol intake.  Objective: Vital signs in last 24 hours: Temp:  [97.4 F (36.3 C)-98.3 F (36.8 C)] 97.6 F (36.4 C) (09/22 0830) Pulse Rate:  [76-83] 80 (09/22 0830) Resp:  [16-18] 18 (09/22 0830) BP: (114-138)/(68-89) 114/68 (09/22 0830) SpO2:  [97 %-100 %] 97 % (09/22 0830) Weight:  [81.7 kg] 81.7 kg (09/22 0447) Last BM Date: 12/05/17  Intake/Output from previous day: 09/21 0701 - 09/22 0700 In: 2067.2 [P.O.:1760; I.V.:307.2] Out: 4450 [Urine:4450] Intake/Output this shift: Total I/O In: 480 [P.O.:480] Out: 1200 [Urine:1200]  PE: General--hard of hearing but no distress Heart--mechanical heart valve sound Lungs--diminished breath sounds lower probably due to effusions Abdomen--no gross ascites.  Lab Results: Recent Labs    12/03/17 0707 12/04/17 0517 12/05/17 0341  WBC 2.7* 2.0* 2.7*  HGB 10.2* 10.5* 10.0*  HCT 30.3* 30.7* 29.0*  PLT 142* 123* 129*   BMET Recent Labs    12/03/17 0707 12/04/17 0517 12/05/17 0341  NA 131* 133* 133*  K 4.3 4.1 3.6  CL 103 100 97*  CO2 21* 26 26  CREATININE 2.71* 2.53* 2.71*   LFT Recent Labs    12/03/17 0707 12/04/17 0517 12/05/17 0341  PROT 4.6* 4.6* 4.5*  AST 53* 42* 33  ALT 37 34 25  ALKPHOS 55 55 48  BILITOT 0.8 0.9 0.7   PT/INR Recent Labs    12/03/17 0707 12/04/17 0517 12/05/17 0341   LABPROT 22.1* 19.0* 20.2*  INR 1.95 1.61 1.74   PANCREAS No results for input(s): LIPASE in the last 72 hours.       Studies/Results: Ct Abdomen Wo Contrast  Result Date: 12/04/2017 CLINICAL DATA:  72 year old male with abnormal LFTs. EXAM: CT ABDOMEN WITHOUT CONTRAST TECHNIQUE: Multidetector CT imaging of the abdomen was performed following the standard protocol without IV contrast. COMPARISON:  01/18/2007 CT FINDINGS: Please note that parenchymal abnormalities may be missed without intravenous contrast. Lower chest: A moderate to large LEFT pleural effusion is noted. Mitral valve replacement identified. Hepatobiliary: The liver and gallbladder are unremarkable. No definite biliary dilatation. Pancreas: Unremarkable Spleen: Unremarkable Adrenals/Urinary Tract: The kidneys and adrenal glands are unremarkable. Stomach/Bowel: The visualized bowel is unremarkable. No bowel distention noted. Vascular/Lymphatic: Aortic atherosclerosis. No enlarged abdominal lymph nodes. Other: A moderate amount of ascites is noted within the abdomen. No evidence of pneumoperitoneum. Subcutaneous edema identified. Musculoskeletal: No acute or suspicious bony abnormalities. IMPRESSION: 1. Moderate to large LEFT pleural effusion, moderate ascites and subcutaneous edema. 2. No hepatic abnormality identified on this noncontrast scan. 3.  Aortic Atherosclerosis (ICD10-I70.0). Electronically Signed   By: Margarette Canada M.D.   On: 12/04/2017 16:28    Medications: I have reviewed the patient's current medications.  Assessment:   1.  Ascites/pleural effusions of unclear etiology.  Echocardiogram of the heart shows normal ejection fraction.  Patient has mechanical heart valve and PAF and has been  anticoagulated.  It does not appear that congestive heart failure is necessarily the etiology.  CT scan of the liver did not show gross cirrhosis.  He has a multitudeOf abnormal labs.  Creatinine remains elevated for unclear cause.   Slight elevation of transaminases but normal total bilirubin.  His albumin is low.  Hemoglobin remains low at 10 with slightly low platelets and WBC.  His 24-hour urine reveals proteinuria his peritoneal fluid reveals a normal WBC count but elevated triglycerides 785.  S AAG 0.5 which is borderline for exudate.  Review of his peritoneal fluid by pathology revealed no atypia with reactive mesothelial cells.  Gram stain was negative, culture pending   Plan: We will plan on going ahead with EGD and colonoscopy tomorrow given his significant anemia and the uncertainty over the etiology of his ascites and pleural effusions.  I have held his Coumadin and we will hold his heparin drip in the morning.  He and his daughter understand Dr. [name] will be performing the procedure.  I would consider a nephrology consult to attempt to determine the etiology of his proteinuria and elevated creatinine.   Nancy Fetter 12/05/2017, 2:01 PM  This note was created using voice recognition software. Minor errors may Have occurred unintentionally.  Pager: [phone] If no answer or after hours call [phone]

## 2017-12-05 NOTE — Progress Notes (Signed)
Family Medicine Teaching Service Daily Progress Note Intern Pager: [phone]  Patient name: Mario Powell Medical record number: [id number] Date of birth: [birth year] Age: 72 y.o. Gender: male  Primary Care Provider: Patient, No Pcp Per Consultants: none Code Status: Full Code   Pt Overview and Major Events to Date:  9/18 admitted with anasarca and AKI 9/20 paracentesis  Assessment and Plan: Mario Powell is a 72 year old male with progressive anasarca. PMH is significant for Afib, MV replacement with mechanical valve on Coumadin, CAD s/p CABG, HTN, h/o prostate cancer.   Anasarca with ascites, unclear etiology. S/p paracentesis, removed 5L chylous fluid, TG 785. Most concerning for malignancy especially given h/o prostate CA with radiation in 2008.  Ddx includes cirrhosis but CT abd wo contrast showed a normal appearing liver and hep panel neg. Seems to be responding well to IV lasix as UOP 5.2L yesterday and weight down to 180lbs from 199 on admit. Transaminitis resolved with IV diuresis. [facility] following, appreciate recommendations. Plan for EGD/colonoscopy on 9/23. Start patient on clear liquid diet today. OK for coumadin  - holding hepatotoxic agents  - strict I/Os, daily weights - f/u peritoneal Cx  Pancytopenia, stable. Differential on CBC nonconcerning.   - monitor CBC  AKI, unchanged: Cr 2.71 unchanged over the past few days from peak 3.12 (baseline 1.1). Question if this is patient new baseline - monitor Cr closely  CAD s/p CABG, stable.  - holding home ASA81 d/t AKI  Mechanical Mitral Valve Replacement: On coumadin at home. INR goal of 2.5-3.5, reversed with Vit K 2.5mg  x1 on 12/02/17 for procedure. INR 1.61 - heparin bridge to coumadin  Atrial Fibrillation, stable. Rate controlled. - heparin bridge as per above  Hx of Prostate Cancer: Patient completed treatment years ago and follows with oncologist at [facility] annually. No urinary symptoms during this admission so  far outside of scrotal swelling 2/2 to anasarca extension. -monitor for obstructive symptoms  -due for PSA in November 2019  FEN/GI: renal diet  PPx: heparin bridge to coumadin  Disposition: pending further work up  Subjective:  States he feels well this morning.  He has had a considerable amount of urine output overnight and is happy that his legs are less edematous this morning.  He has no abdominal pain or nausea or vomiting.  He has no complaints today.  He states that his right leg is always greater than his left due to the surgery he had in it many years ago  Objective: Temp:  [97.4 F (36.3 C)-98.3 F (36.8 C)] 98.1 F (36.7 C) (09/22 0600) Pulse Rate:  [73-83] 83 (09/22 0600) Resp:  [16-18] 16 (09/22 0600) BP: (117-138)/(68-89) 129/68 (09/22 0600) SpO2:  [97 %-100 %] 97 % (09/22 0600) Weight:  [81.7 kg] 81.7 kg (09/22 0447)  Physical Exam: General: Laying in bed comfortably, no acute distress Cardiovascular: Regular rate, sharp mechanical S1 click, normal S2.  No rubs or gallops Respiratory: Clear to auscultation bilaterally, normal work of breathing on room air Gastrointestinal: Soft, nontender, nondistended, positive bowel sounds. MSK: 2+ pitting edema in his legs, right greater than left without erythema or tenderness Derm: Warm and dry. Neuro: Alert and oriented, grossly normal apart from hearing issues Psych: Appropriate affect  Laboratory: Recent Labs  Lab 12/03/17 0707 12/04/17 0517 12/05/17 0341  WBC 2.7* 2.0* 2.7*  HGB 10.2* 10.5* 10.0*  HCT 30.3* 30.7* 29.0*  PLT 142* 123* 129*   Recent Labs  Lab 12/03/17 0707 12/04/17 0517 12/05/17  0341  NA 131* 133* 133*  K 4.3 4.1 3.6  CL 103 100 97*  CO2 21* 26 26  BUN 34* 32* 32*  CREATININE 2.71* 2.53* 2.71*  CALCIUM 8.4* 8.4* 8.1*  PROT 4.6* 4.6* 4.5*  BILITOT 0.8 0.9 0.7  ALKPHOS 55 55 48  ALT 37 34 25  AST 53* 42* 33  GLUCOSE 109* 122* 125*   TSH: 9.619 Free T4: 1.25 Free T3: 2.2 A1c:  5.7 BNP: 45.1 Urea nitrogen 741 Protein/creatinine ratio, urine: 0.25 hepatitis panel negative  Peritoneal fluid: albumin 1.8, LDH 126, WBC 157, neutrophils 21, TG 785  Imaging/Diagnostic Tests:  ECHO: Study Conclusions - Left ventricle: The cavity size was normal. Wall thickness was   normal. Systolic function was normal. The estimated ejection   fraction was in the range of 60% to 65%. - Mitral valve: MV posthesis is well seated. Appears to move well.   Peak and mean gradients through the valve are 11 and 4 mm Hg   respectively There is trival MR. - Right ventricle: Systolic function was mildly reduced. - Pericardium, extracardiac: There was a right pleural effusion.   There was a left pleural effusions Impressions: - Very poor acoustic windows limit study  Renal US: IMPRESSION: 1. Ultrasound appearance of the kidneys is within normal limits 2. Moderate to large amount of ascites in the abdomen. Left pleural effusion.  Ct Abdomen Wo Contrast  Result Date: 12/04/2017 CLINICAL DATA:  72 year old male with abnormal LFTs. EXAM: CT ABDOMEN WITHOUT CONTRAST TECHNIQUE: Multidetector CT imaging of the abdomen was performed following the standard protocol without IV contrast. COMPARISON:  01/18/2007 CT FINDINGS: Please note that parenchymal abnormalities may be missed without intravenous contrast. Lower chest: A moderate to large LEFT pleural effusion is noted. Mitral valve replacement identified. Hepatobiliary: The liver and gallbladder are unremarkable. No definite biliary dilatation. Pancreas: Unremarkable Spleen: Unremarkable Adrenals/Urinary Tract: The kidneys and adrenal glands are unremarkable. Stomach/Bowel: The visualized bowel is unremarkable. No bowel distention noted. Vascular/Lymphatic: Aortic atherosclerosis. No enlarged abdominal lymph nodes. Other: A moderate amount of ascites is noted within the abdomen. No evidence of pneumoperitoneum. Subcutaneous edema identified.  Musculoskeletal: No acute or suspicious bony abnormalities. IMPRESSION: 1. Moderate to large LEFT pleural effusion, moderate ascites and subcutaneous edema. 2. No hepatic abnormality identified on this noncontrast scan. 3.  Aortic Atherosclerosis (ICD10-I70.0). Electronically Signed   By: Margarette Canada M.D.   On: 12/04/2017 16:28    Bufford Lope, DO PGY-3, [facility] 12/05/2017 8:10 AM

## 2017-12-06 ENCOUNTER — Encounter (HOSPITAL_COMMUNITY)
Admission: EM | Disposition: A | Discharge status: Home / Self Care | Payer: Self-pay | Source: Home / Self Care | Attending: Family Medicine

## 2017-12-06 ENCOUNTER — Inpatient Hospital Stay (HOSPITAL_COMMUNITY): Payer: Medicare Other | Admitting: Anesthesiology

## 2017-12-06 ENCOUNTER — Encounter (HOSPITAL_COMMUNITY): Payer: Self-pay | Admitting: Anesthesiology

## 2017-12-06 DIAGNOSIS — K2970 Gastritis, unspecified, without bleeding: Secondary | ICD-10-CM

## 2017-12-06 HISTORY — PX: COLONOSCOPY WITH PROPOFOL: SHX5780

## 2017-12-06 HISTORY — PX: POLYPECTOMY: SHX5525

## 2017-12-06 HISTORY — PX: BIOPSY: SHX5522

## 2017-12-06 HISTORY — PX: ESOPHAGOGASTRODUODENOSCOPY (EGD) WITH PROPOFOL: SHX5813

## 2017-12-06 LAB — CBC
HCT: 30.9 % — ABNORMAL LOW (ref 39.0–52.0)
Hemoglobin: 10.7 g/dL — ABNORMAL LOW (ref 13.0–17.0)
MCH: 34.1 pg — AB (ref 26.0–34.0)
MCHC: 34.6 g/dL (ref 30.0–36.0)
MCV: 98.4 fL (ref 78.0–100.0)
Platelets: 133 10*3/uL — ABNORMAL LOW (ref 150–400)
RBC: 3.14 MIL/uL — ABNORMAL LOW (ref 4.22–5.81)
RDW: 12.9 % (ref 11.5–15.5)
WBC: 2.5 10*3/uL — ABNORMAL LOW (ref 4.0–10.5)

## 2017-12-06 LAB — COMPREHENSIVE METABOLIC PANEL
ALBUMIN: 2.3 g/dL — AB (ref 3.5–5.0)
ALT: 29 U/L (ref 0–44)
AST: 40 U/L (ref 15–41)
Alkaline Phosphatase: 61 U/L (ref 38–126)
Anion gap: 9 (ref 5–15)
BILIRUBIN TOTAL: 0.9 mg/dL (ref 0.3–1.2)
BUN: 24 mg/dL — AB (ref 8–23)
CHLORIDE: 97 mmol/L — AB (ref 98–111)
CO2: 27 mmol/L (ref 22–32)
Calcium: 8.3 mg/dL — ABNORMAL LOW (ref 8.9–10.3)
Creatinine, Ser: 2.03 mg/dL — ABNORMAL HIGH (ref 0.61–1.24)
GFR calc Af Amer: 36 mL/min — ABNORMAL LOW (ref >60)
GFR calc non Af Amer: 31 mL/min — ABNORMAL LOW (ref >60)
GLUCOSE: 113 mg/dL — AB (ref 70–99)
POTASSIUM: 3.4 mmol/L — AB (ref 3.5–5.1)
Sodium: 133 mmol/L — ABNORMAL LOW (ref 135–145)
TOTAL PROTEIN: 4.9 g/dL — AB (ref 6.5–8.1)

## 2017-12-06 LAB — HEPARIN LEVEL (UNFRACTIONATED): Heparin Unfractionated: 0.56 IU/mL (ref 0.30–0.70)

## 2017-12-06 LAB — PROTIME-INR
INR: 1.79
Prothrombin Time: 20.6 seconds — ABNORMAL HIGH (ref 11.4–15.2)

## 2017-12-06 SURGERY — ESOPHAGOGASTRODUODENOSCOPY (EGD) WITH PROPOFOL
Anesthesia: Monitor Anesthesia Care

## 2017-12-06 MED ORDER — PROPOFOL 10 MG/ML IV BOLUS
INTRAVENOUS | Status: DC | PRN
  Administered 2017-12-06: 30 mg via INTRAVENOUS

## 2017-12-06 MED ORDER — ONDANSETRON HCL 4 MG/2ML IJ SOLN
INTRAMUSCULAR | Status: DC | PRN
  Administered 2017-12-06: 4 mg via INTRAVENOUS

## 2017-12-06 MED ORDER — SODIUM CHLORIDE 0.9 % IV SOLN
INTRAVENOUS | Status: DC
  Administered 2017-12-06: 1000 mL via INTRAVENOUS

## 2017-12-06 MED ORDER — LIDOCAINE HCL (CARDIAC) PF 100 MG/5ML IV SOSY
PREFILLED_SYRINGE | INTRAVENOUS | Status: DC | PRN
  Administered 2017-12-06: 60 mg via INTRAVENOUS

## 2017-12-06 MED ORDER — PANTOPRAZOLE SODIUM 40 MG PO TBEC
40.0000 mg | DELAYED_RELEASE_TABLET | Freq: Two times a day (BID) | ORAL | Status: DC
  Administered 2017-12-06 – 2017-12-08 (×5): 40 mg via ORAL
  Filled 2017-12-06 (×5): qty 1

## 2017-12-06 MED ORDER — HEPARIN (PORCINE) IN NACL 100-0.45 UNIT/ML-% IJ SOLN
1300.0000 [IU]/h | INTRAMUSCULAR | Status: AC
  Administered 2017-12-06 – 2017-12-07 (×2): 1300 [IU]/h via INTRAVENOUS
  Filled 2017-12-06: qty 250

## 2017-12-06 MED ORDER — PHENYLEPHRINE 40 MCG/ML (10ML) SYRINGE FOR IV PUSH (FOR BLOOD PRESSURE SUPPORT)
PREFILLED_SYRINGE | INTRAVENOUS | Status: DC | PRN
  Administered 2017-12-06 (×3): 80 ug via INTRAVENOUS

## 2017-12-06 MED ORDER — FUROSEMIDE 40 MG PO TABS
40.0000 mg | ORAL_TABLET | Freq: Every day | ORAL | Status: DC
  Administered 2017-12-07 – 2017-12-08 (×2): 40 mg via ORAL
  Filled 2017-12-06 (×2): qty 1

## 2017-12-06 MED ORDER — EPHEDRINE SULFATE-NACL 50-0.9 MG/10ML-% IV SOSY
PREFILLED_SYRINGE | INTRAVENOUS | Status: DC | PRN
  Administered 2017-12-06: 10 mg via INTRAVENOUS

## 2017-12-06 MED ORDER — PROPOFOL 500 MG/50ML IV EMUL
INTRAVENOUS | Status: DC | PRN
  Administered 2017-12-06: 150 ug/kg/min via INTRAVENOUS

## 2017-12-06 SURGICAL SUPPLY — 25 items

## 2017-12-06 NOTE — Progress Notes (Signed)
Pt gone down for EGD/Colonoscopy.

## 2017-12-06 NOTE — Progress Notes (Signed)
ANTICOAGULATION CONSULT NOTE - Follow Up Consult  Pharmacy Consult for Heparin Indication: mechanical valve  Allergies  Allergen Reactions  . Penicillins Other (See Comments)    REACTION: UNKNOWN    Patient Measurements: Height: 5\' 11"  (180.3 cm) Weight: 180 lb 1.9 oz (81.7 kg) IBW/kg (Calculated) : 75.3 Heparin Dosing Weight: 81.7 kg  Vital Signs: Temp: 97.8 F (36.6 C) (09/23 1350) Temp Source: Axillary (09/23 1350) BP: 112/48 (09/23 1410) Pulse Rate: 73 (09/23 1410)  Labs: Recent Labs    12/04/17 0517  12/04/17 2320 12/05/17 0341 12/06/17 0552  HGB 10.5*  --   --  10.0* 10.7*  HCT 30.7*  --   --  29.0* 30.9*  PLT 123*  --   --  129* 133*  LABPROT 19.0*  --   --  20.2* 20.6*  INR 1.61  --   --  1.74 1.79  HEPARINUNFRC  --    < > 0.50 0.53 0.56  CREATININE 2.53*  --   --  2.71* 2.03*   < > = values in this interval not displayed.    Estimated Creatinine Clearance: 34.5 mL/min (A) (by C-G formula based on SCr of 2.03 mg/dL (H)).   Medications:  Scheduled:  . [START ON 12/07/2017] furosemide  40 mg Oral Daily  . pantoprazole  40 mg Oral BID   Infusions:  . heparin      Assessment: 72 yo M off Coumadin for paracentesis (done 9/20) and EGD/colonoscopy (9/23).  Pt is being bridged with heparin given hx mechanical valve.  Per GI, ok to restart heparin post procedure.  OK to restart Coumadin 9/24 if Hgb stable.  Pt was previously therapeutic on heparin at 1300 units/hr.  Goal of Therapy:  Heparin level 0.3-0.7 units/ml Monitor platelets by anticoagulation protocol: Yes   Plan:  Restart heparin at 1300 units/hr.  No bolus. Next heparin level with AM labs. Heparin level, CBC, and INR daily. Follow-up plans to restart Coumadin 9/24.  [employer], Pharm.D., BCPS Clinical Pharmacist Pager: [phone] Clinical phone for 12/06/2017 is [phone].  **Pharmacist phone directory can now be found on [email or web address] ([email or web address]).  Listed under [place].  12/06/2017  3:10 PM

## 2017-12-06 NOTE — Op Note (Signed)
[facility] Patient Name: Mario Powell Procedure Date : 12/06/2017 MRN: [id number] Attending MD: Otis Brace , MD Date of Birth: 07-Jun-1945 CSN: [id number] Age: 72 Admit Type: Inpatient Procedure:                Colonoscopy Indications:              Unexplained iron deficiency anemia Providers:                Otis Brace, MD, Burtis Junes, RN, Alan Mulder,                            Technician Referring MD:              Medicines:                Sedation Administered by an Anesthesia Professional Complications:            No immediate complications. Estimated Blood Loss:     Estimated blood loss was minimal. Procedure:                Pre-Anesthesia Assessment:                           - Prior to the procedure, a History and Physical                            was performed, and patient medications and                            allergies were reviewed. [name] tolerance of                            previous anesthesia was also reviewed. The risks                            and benefits of the procedure and the sedation                            options and risks were discussed with the patient.                            All questions were answered, and informed consent                            was obtained. Prior Anticoagulants: The patient                            last took Coumadin (warfarin) 2 days prior to the                            procedure and last took heparin on the day of the                            procedure. ASA Grade Assessment: III - A patient  with severe systemic disease. After reviewing the                            risks and benefits, the patient was deemed in                            satisfactory condition to undergo the procedure.                           - Prior to the procedure, a History and Physical                            was performed, and patient medications and         allergies were reviewed. [name] tolerance of                            previous anesthesia was also reviewed. The risks                            and benefits of the procedure and the sedation                            options and risks were discussed with the patient.                            All questions were answered, and informed consent                            was obtained. Prior Anticoagulants: The patient                            last took Coumadin (warfarin) 2 days prior to the                            procedure and last took heparin on the day of the                            procedure. ASA Grade Assessment: III - A patient                            with severe systemic disease. After reviewing the                            risks and benefits, the patient was deemed in                            satisfactory condition to undergo the procedure.                           After obtaining informed consent, the colonoscope                            was passed  under direct vision. Throughout the                            procedure, the patient's blood pressure, pulse, and                            oxygen saturations were monitored continuously. The                            [id number]-[id number] ([id number]) peds colon was introduced                            through the anus and advanced to the the cecum,                            identified by appendiceal orifice and ileocecal                            valve. The colonoscopy was performed without                            difficulty. The patient tolerated the procedure                            well. The quality of the bowel preparation was good. Scope In: 1:21:16 PM Scope Out: 1:45:41 PM Scope Withdrawal Time: 0 hours 19 minutes 1 second  Total Procedure Duration: 0 hours 24 minutes 25 seconds  Findings:      The perianal and digital rectal examinations were normal.      A scattered area of mildly  erythematous, hemorrhagic, inflamed and       petechial mucosa was found in the entire colon. Biopsies were taken with       a cold forceps for histology.      A 10 mm polyp was found in the rectum. The polyp was semi-pedunculated.       The polyp was removed with a hot snare. Resection and retrieval were       complete. To prevent bleeding after the polypectomy, one hemostatic clip       was successfully placed.      Internal hemorrhoids were found during retroflexion. The hemorrhoids       were medium-sized. Impression:               - Erythematous, hemorrhagic, inflamed and petechial                            mucosa in the entire examined colon. Biopsied.                           - One 10 mm polyp in the rectum, removed with a hot                            snare. Resected and retrieved. Clip was placed.                           - Internal hemorrhoids.  Recommendation:           - Return patient to hospital ward for ongoing care.                           - Full liquid diet.                           - Continue present medications.                           - Resume heparin at prior dose today.                           - Await pathology results.                           - Repeat colonoscopy date to be determined after                            pending pathology results are reviewed for                            surveillance based on pathology results. Procedure Code(s):        --- Professional ---                           [phone], Colonoscopy, flexible; with removal of                            tumor(s), polyp(s), or other lesion(s) by snare                            technique                           45380, 4, Colonoscopy, flexible; with biopsy,                            single or multiple Diagnosis Code(s):        --- Professional ---                           K62.5, Hemorrhage of anus and rectum                           K92.2, Gastrointestinal hemorrhage, unspecified                            K52.9, Noninfective gastroenteritis and colitis,                            unspecified                           K62.89, Other specified diseases of anus and rectum  K63.89, Other specified diseases of intestine                           K62.1, Rectal polyp                           K64.8, Other hemorrhoids                           D50.9, Iron deficiency anemia, unspecified CPT copyright 2017 [employer]. All rights reserved. The codes documented in this report are preliminary and upon coder review may  be revised to meet current compliance requirements. Otis Brace, MD Otis Brace, MD 12/06/2017 2:04:29 PM Number of Addenda: 0

## 2017-12-06 NOTE — Anesthesia Postprocedure Evaluation (Signed)
Anesthesia Post Note  Patient: Mario Powell  Procedure(s) Performed: ESOPHAGOGASTRODUODENOSCOPY (EGD) WITH PROPOFOL (N/A ) COLONOSCOPY WITH PROPOFOL hemestasis clip applied (N/A ) BIOPSY POLYPECTOMY     Patient location during evaluation: PACU Anesthesia Type: MAC Level of consciousness: awake and alert Pain management: pain level controlled Vital Signs Assessment: post-procedure vital signs reviewed and stable Respiratory status: spontaneous breathing, nonlabored ventilation, respiratory function stable and patient connected to nasal cannula oxygen Cardiovascular status: stable and blood pressure returned to baseline Postop Assessment: no apparent nausea or vomiting Anesthetic complications: no    Last Vitals:  Vitals:   12/06/17 1400 12/06/17 1410  BP: (!) 103/37 (!) 112/48  Pulse: 70 73  Resp: 16 (!) 22  Temp:    SpO2: 94% 96%    Last Pain:  Vitals:   12/06/17 1410  TempSrc:   PainSc: 0-No pain                 Barnet Glasgow

## 2017-12-06 NOTE — Progress Notes (Signed)
Pt back from Endo.  Pt alert and oriented x 4.  VSS. Denies pain at this time.  Family at bedside.

## 2017-12-06 NOTE — Discharge Summary (Signed)
Mario Powell Powell Discharge Summary  Patient name: Mario Powell Medical record number: [id number] Date of birth: 05/27/1945 Age: 72 y.o. Gender: male Date of Admission: 12/01/2017  Date of Discharge: 12/07/17  Admitting Physician: Mario Covert, MD  Primary Care Provider: Wilber Oliphant, MD Consultants: [facility]  Indication for Hospitalization: Anasarca  Discharge Diagnoses/Problem List:  Patient Active Problem List   Diagnosis Date Noted  . Gastritis   . Pancytopenia ([place])   . Ascites   . Cirrhosis of liver with ascites ([place])   . AKI (acute kidney injury) ([place])   . Hyponatremia   . Anticoagulated   . H/O mitral valve replacement   . Anasarca 12/01/2017  . Renal insufficiency   . Encounter for therapeutic drug monitoring 04/16/2017  . CAD (coronary artery disease) of artery bypass graft 07/12/2013  . Essential hypertension 07/12/2013  . Prostate cancer ([place]) 07/12/2013  . Chronic anticoagulation 07/12/2013  . Paroxysmal atrial fibrillation ([place]) 07/12/2013  . History of mitral valve replacement with mechanical valve 01/11/2013   Disposition: home   Discharge Condition: Stable  Discharge Exam:  BP (!) 112/48   Pulse 73   Temp 97.8 F (36.6 C) (Axillary)   Resp (!) 22   Ht 5\' 11"  (1.803 m)   Wt 81.7 kg   SpO2 96%   BMI 25.12 kg/m  Gen: NAD, alert, non-toxic, well-nourished, well-appearing, sitting comfortably.  HEENT: Normocephaic, atraumatic. Clear conjuctiva, no scleral icterus and injection.  CV: Regular rate and rhythm.  Normal S1-S2.   Normal capillary refill bilaterally.  Radial pulses 2+ bilaterally. No bilateral lower extremity edema. Resp: Clear to auscultation bilaterally.  No wheezing, rales, abnormal lung sounds.  No increased work of breathing appreciated. Abd: Nontender and nondistended on palpation to all 4 quadrants.  Positive bowel sounds.  Extremities: Full ROM.  Significant swelling in bilateral feet and lower ankles.   2+ pitting edema.  Edema improves with extension to knees. Psych: Cooperative with exam. Pleasant. Makes eye contact.  Brief Powell Course:  Mario Powell is a 72 y.o. male with past medical history significant for A. fib on Coumadin, CAD, HTN, mitral valve replacement, bilateral lower extremity edema x 20 years prostate cancer status post radiation therapy, who presented with diffuse anasarca that acutely worsened over the last 4 weeks.  The initial differential was liver failure, heart failure, malignancy. Initial work up with significant for BNP 45, mild transaminitis, and AKI.  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  SAAG 0.5 ruling out portal hypertension, and white count of 157, and triglyceride 785 most concerning for lymphatic blockage and possibly malignancy. Micro testing revealed no atypia with reactive mesothelial cells and Gram stain was negative. CT scan of abdomen was negative for cirrhosis and hepatitis panel was negative. On 9/23, patient had upper and lower endoscopy to further work up possible GI/Liver etiology.  EGD showed diffuse gastritis with evidence of recent bleeding from 2 inflammatory polyps in the laser curvature of stomach and removed with hot snare.  Biopsies were taken.  There were no bleeding complications during procedure.  Colonoscopy showed scattered areas of inflammation and petechiae throughout the colon.  A 10 mm rectal polyp was removed and sent for biopsy.  Internal hemorrhoids were found.  With these findings, GI recommended starting patient on twice daily PPI. Of note, patient was ultimately placed on heparin drip for anticoagulation. He was restarted on 1.5mg  coumadin on 9/24 evening discharged with 15mg /kg lovenox  qD for 3-5 days until further  f/u at [facility].  During admission, patient's anasarca responded well to Lasix. Cardiac causes of ascites was ruled out as heart failure was also less likely as patient has had mechanical  valve with echo of 60 to 65% ejection fraction with trivial mitral regurg. Over admission, patient's anasarca continued to improve, and Mario Powell was stable by the day of discharge, stating that his legs were as small as they had ever been in decades.  Other findings on admission included pancytopenia which remained stable with no acute changes.  Etiology of chylous fluid remained undetermined.  Patient was discharged as he was medically stable to continue outpatient follow-up and testing.  Additionally, nutrition was consulted to increase protein for chronic protein malnourishment and decrease long fatty acid chain fats in an attempt to decrease production of chylous fluid.  Multivitamin with minerals daily, MCT Oil 1 teaspoon 3-4 times daily, boost breeze p.o. 3 times daily, and Protostat 30 mL fat-free supplement were all recommended and added to his inpatient orders.   Issues for Follow Up:  1. Symptom improvement of anasarca.  2. Biopsy results from EGD and colonoscopy 3. Refer for follow-up with hematology for pancytopenia 4. Follow-up BMP and CBC to check for creatinine and CBC.  5. Restart ASA, HCTZ, ramipril if kidney function returns to baseline 6. Per GI, continue PPI BID x 4 weeks (October 24th) and then QD x 4 weeks. (October 25- Novemeber 25) 7. Nutrition status, diet follow up for chylous fluid 8. Follow up at [facility]  9. Schedule MRI 6 to 8 weeks after discharge.  Abdominal x-ray to confirm clips have passed. Multidisciplinary follow up:  10. Referred to nephrology for AKI resolved during admission.  Concerning GFR and creatinine. 11. Refer for follow-up with urology (Dr. Lovena Powell) for history of prostate cancer.  Patient is due for PSA in November 2019. 12. GI: Follow-up with Dr. Penelope Powell on October 22nd at 10:45AM 13. Follow up at [facility] on Friday 9/27 and 9/30  Significant Procedures:   Procedure Orders     Procedural/ Surgical Case Request:  ESOPHAGOGASTRODUODENOSCOPY (EGD) WITH PROPOFOL, COLONOSCOPY WITH PROPOFOL     UPPER ENDOSCOPY     COLONOSCOPY     ED EKG within 10 minutes     ECHOCARDIOGRAM COMPLETE  Significant Labs and Imaging:  Recent Labs  Lab 12/04/17 0517 12/05/17 0341 12/06/17 0552  WBC 2.0* 2.7* 2.5*  HGB 10.5* 10.0* 10.7*  HCT 30.7* 29.0* 30.9*  PLT 123* 129* 133*   Recent Labs  Lab 12/02/17 0651 12/03/17 0707 12/04/17 0517 12/05/17 0341 12/06/17 0552  NA 133* 131* 133* 133* 133*  K 4.6 4.3 4.1 3.6 3.4*  CL 101 103 100 97* 97*  CO2 23 21* 26 26 27   GLUCOSE 104* 109* 122* 125* 113*  BUN 36* 34* 32* 32* 24*  CREATININE 2.96* 2.71* 2.53* 2.71* 2.03*  CALCIUM 8.5* 8.4* 8.4* 8.1* 8.3*  ALKPHOS 68 55 55 48 61  AST 79* 53* 42* 33 40  ALT 50* 37 34 25 29  ALBUMIN 2.3* 2.3* 2.2* 2.1* 2.3*    Ct Abdomen Wo Contrast  Result Date: 12/04/2017 CLINICAL DATA:  72 year old male with abnormal LFTs. EXAM: CT ABDOMEN WITHOUT CONTRAST TECHNIQUE: Multidetector CT imaging of the abdomen was performed following the standard protocol without IV contrast. COMPARISON:  01/18/2007 CT FINDINGS: Please note that parenchymal abnormalities may be missed without intravenous contrast. Lower chest: A moderate to large LEFT pleural effusion is  noted. Mitral valve replacement identified. Hepatobiliary: The liver and gallbladder are unremarkable. No definite biliary dilatation. Pancreas: Unremarkable Spleen: Unremarkable Adrenals/Urinary Tract: The kidneys and adrenal glands are unremarkable. Stomach/Bowel: The visualized bowel is unremarkable. No bowel distention noted. Vascular/Lymphatic: Aortic atherosclerosis. No enlarged abdominal lymph nodes. Other: A moderate amount of ascites is noted within the abdomen. No evidence of pneumoperitoneum. Subcutaneous edema identified. Musculoskeletal: No acute or suspicious bony abnormalities. IMPRESSION: 1. Moderate to large LEFT pleural effusion, moderate ascites and subcutaneous edema. 2. No  hepatic abnormality identified on this noncontrast scan. 3.  Aortic Atherosclerosis (ICD10-I70.0). Electronically Signed   By: Margarette Canada M.D.   On: 12/04/2017 16:28   Dg Chest 2 View  Result Date: 12/01/2017 CLINICAL DATA:  Bilateral lower extremity swelling x1 month. SOB when supine. Pt denies chest pain. Hx of HTN, CAD, A-fib, Mitral valve replacement. Ex-smoker, quit in 1986. EXAM: CHEST - 2 VIEW COMPARISON:  None. FINDINGS: Sternotomy wires overlie normal cardiac silhouette. Moderate LEFT effusions present. Small RIGHT effusion. Lungs are clear. No osseous abnormality. IMPRESSION: Bilateral effusions, LEFT greater than RIGHT. No comparison available. Electronically Signed   By: Suzy Bouchard M.D.   On: 12/01/2017 11:24   US Renal  Result Date: 12/01/2017 CLINICAL DATA:  Acute kidney injury EXAM: RENAL / URINARY TRACT ULTRASOUND COMPLETE COMPARISON:  CT 11/22/2006 FINDINGS: Right Kidney: Length: 9.2 cm. Echogenicity within normal limits. No mass or hydronephrosis visualized. Left Kidney: Length: 7.4 cm. Echogenicity within normal limits. No mass or hydronephrosis visualized. Bladder: Appears normal for degree of bladder distention. Moderate to large amount of free fluid in the abdomen. Incidental note made of left pleural effusion. IMPRESSION: 1. Ultrasound appearance of the kidneys is within normal limits 2. Moderate to large amount of ascites in the abdomen. Left pleural effusion. Electronically Signed   By: Donavan Foil M.D.   On: 12/01/2017 19:49   US Abdomen Limited Ruq  Result Date: 12/02/2017 CLINICAL DATA:  Distension with ascites EXAM: ULTRASOUND ABDOMEN LIMITED RIGHT UPPER QUADRANT COMPARISON:  12/01/2017 FINDINGS: Gallbladder: Small stones measuring up to 3 mm. Normal wall thickness. Negative sonographic Murphy Common bile duct: Diameter: 3.8 mm Liver: Subtle contour nodularity of the liver. No focal hepatic abnormality. Portal vein is patent on color Doppler imaging with normal  direction of blood flow towards the liver. Small moderate ascites IMPRESSION: 1. Cholelithiasis without sonographic evidence for cholecystitis or biliary dilatation 2. Slight contour nodularity of the liver suggesting cirrhosis 3. Small to moderate ascites Electronically Signed   By: Donavan Foil M.D.   On: 12/02/2017 19:21   Ir Paracentesis  Result Date: 12/03/2017 INDICATION: Patient with abdominal distention, found to have ascites. Request is made for diagnostic and therapeutic paracentesis. Due to renal insufficiency will limit removal to 5 liters today. EXAM: ULTRASOUND GUIDED DIAGNOSTIC AND THERAPEUTIC PARACENTESIS MEDICATIONS: 10 mL 1% lidocaine COMPLICATIONS: None immediate. PROCEDURE: Informed written consent was obtained from the patient after a discussion of the risks, benefits and alternatives to treatment. A timeout was performed prior to the initiation of the procedure. Initial ultrasound scanning demonstrates a large amount of ascites within the right lower abdominal quadrant. The right lower abdomen was prepped and draped in the usual sterile fashion. 1% lidocaine was used for local anesthesia. Following this, a 6 Fr Safe-T-Centesis catheter was introduced. An ultrasound image was saved for documentation purposes. The paracentesis was performed. The catheter was removed and a dressing was applied. The patient tolerated the procedure well without immediate post procedural complication. FINDINGS: A total  of approximately 5.0 liters of chylous-appearing fluid was removed. Samples were sent to the laboratory as requested by the clinical team. IMPRESSION: Successful ultrasound-guided diagnostic and therapeutic paracentesis yielding 5.0 liters of peritoneal fluid. Read by: Brynda Greathouse PA-C Electronically Signed   By: Aletta Edouard M.D.   On: 12/03/2017 14:59   EGD   Showed diffuse gastritis with evidence of recent bleeding.   Etiology of recent hemorrhage there was no significant underlying  findings were noted.   Patient had 2 inflammatory polyps in the laser curvature of the stomach with evidence of recent bleeding which were removed with hot snare and retried with the net.   Biopsies from gastric mucosa was taken.   Colonoscopy   scattered areas of inflammation and petechiae throughout the colon. Biopsies were taken.   10 mm rectal polyp which was removed with hot snare and one clip were placed for oozing of blood after polypectomy.   Internal hemorrhoids were found.  Results/Tests Pending at Time of Discharge:  . GI biopsies from upper and lower. . Peritoneal fluid culture pending.   Discharge Medications:  Allergies as of 12/08/2017      Reactions   Penicillins Other (See Comments)   REACTION: UNKNOWN      Medication List    STOP taking these medications   aspirin 81 MG tablet   hydrochlorothiazide 25 MG tablet Commonly known as:  HYDRODIURIL   ramipril 10 MG capsule Commonly known as:  ALTACE     TAKE these medications   enoxaparin 120 MG/0.8ML injection Commonly known as:  LOVENOX Inject 0.8 mLs (120 mg total) into the skin daily. Start taking on:  12/09/2017   ezetimibe 10 MG tablet Commonly known as:  ZETIA Take 1 tablet (10 mg total) by mouth daily.   feeding supplement (PRO-STAT SUGAR FREE 64) Liqd Take 30 mLs by mouth 3 (three) times daily with meals. Start taking on:  12/09/2017   furosemide 40 MG tablet Commonly known as:  LASIX Take 1 tablet (40 mg total) by mouth every other day for 8 days. Start taking on:  12/09/2017   medium chain triglycerides oil Commonly known as:  MCT OIL Take 15 mLs by mouth 3 (three) times daily with meals.   multivitamin with minerals Tabs tablet Take 1 tablet by mouth daily. Start taking on:  12/09/2017   niacin 1000 MG CR tablet Commonly known as:  NIASPAN Take 1 tablet (1,000 mg total) by mouth at bedtime.   pantoprazole 40 MG tablet Commonly known as:  PROTONIX Take 1 tablet (40 mg total) by  mouth 2 (two) times daily.   rosuvastatin 40 MG tablet Commonly known as:  CRESTOR Take 1 tablet (40 mg total) by mouth daily.   warfarin 1 MG tablet Commonly known as:  COUMADIN Take as directed. If you are unsure how to take this medication, talk to your nurse or doctor. Original instructions:  TAKE AS DIRECTED BY ANTICOAGULATION CLINIC What changed:  See the new instructions.       Discharge Instructions: Please refer to Patient Instructions section of EMR for full details.  Patient was counseled important signs and symptoms that should prompt return to medical care, changes in medications, dietary instructions, activity restrictions, and follow up appointments.   Follow-Up Appointments: Future Appointments  Date Time Provider [place]  12/10/2017 11:15 AM [facility]  12/13/2017  1:00 PM [facility]  12/17/2017  2:30 PM Mario Oliphant, MD [facility]  Mario Oliphant, MD 12/08/2017, 9:50 AM PGY-1, [place]

## 2017-12-06 NOTE — Anesthesia Preprocedure Evaluation (Addendum)
Anesthesia Evaluation  Patient identified by MRN, date of birth, ID band Patient awake    Reviewed: Allergy & Precautions, H&P , NPO status , Patient's Chart, lab work & pertinent test results  Airway Mallampati: I  TM Distance: >3 FB Neck ROM: Full    Dental no notable dental hx. (+) Poor Dentition, Dental Advisory Given   Pulmonary neg pulmonary ROS, former smoker,    Pulmonary exam normal breath sounds clear to auscultation       Cardiovascular hypertension, Pt. on medications + CAD and + CABG   Rhythm:Regular Rate:Normal     Neuro/Psych negative neurological ROS  negative psych ROS   GI/Hepatic negative GI ROS, Neg liver ROS,   Endo/Other  negative endocrine ROS  Renal/GU Renal InsufficiencyRenal disease  negative genitourinary   Musculoskeletal   Abdominal   Peds  Hematology negative hematology ROS (+)   Anesthesia Other Findings   Reproductive/Obstetrics negative OB ROS                            Anesthesia Physical Anesthesia Plan  ASA: III  Anesthesia Plan: MAC   Post-op Pain Management:    Induction: Intravenous  PONV Risk Score and Plan: 1 and Propofol infusion  Airway Management Planned: Nasal Cannula  Additional Equipment:   Intra-op Plan:   Post-operative Plan:   Informed Consent: I have reviewed the patients History and Physical, chart, labs and discussed the procedure including the risks, benefits and alternatives for the proposed anesthesia with the patient or authorized representative who has indicated his/her understanding and acceptance.   Dental advisory given  Plan Discussed with: CRNA  Anesthesia Plan Comments:         Anesthesia Quick Evaluation

## 2017-12-06 NOTE — Progress Notes (Addendum)
CALL PAGER [phone] for any questions or notifications regarding this patient   FMTS Attending Daily Note: [name], MD  Pager [phone]  Office [phone] I have seen and examined this patient, reviewed their chart. I have discussed this patient with the resident. I agree with the resident's findings, assessment and care plan.  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  Discussed likelihood of radiation-induced chylous ascites 10 years out from external beam radiation.  This is also highly unlikely.  Spoke with both radiation oncology and urology about this.  Malignancy remains on the differential.  Further evaluation can include MRI of the lymphatics which we will speak with radiology about.  Acute kidney injury, improving  Gastritis, noted on EGD.  PPI started we will monitor hemoglobin closely as we restart warfarin  History of mitral valve replacement Lovenox bridge and warfarin.  INR goal is 2.5-3.5  Family Medicine Teaching Service Daily Progress Note Intern Pager: [phone]  Patient name: Mario Powell Medical record number: [id number] Date of birth: 1945-10-22 Age: 72 y.o. Gender: male  Primary Care Provider: Wilber Oliphant, MD Consultants: GI Code Status: Full code  Pt Overview and Major Events to Date:  Hospital Day 6 Admitted: 12/01/2017  9/20 paracentesis Upper and lower Scopes 12/06/17  Assessment and Plan: [name] is a 72 year old male with progressive anasarca in the setting of chronic bilateral lower extremity edema x20 years. PMH is significant forAfib, MV replacement with mechanical valve on Coumadin, CAD s/p CABG, HTN, h/o prostate cancer.   Anasarca with ascites, unclear etiology.  S/p paracentesis, removed 5L chylous fluid, TG 785.  On 9/20, patient weighed 199.5 pounds.  Yesterday's weight was 180.34 pounds with a net loss of 19.16 poundsPatient is stable and can be discharged  home and continue work-up outpatient.  Outpatient Follow up  ? Follow up PSA  ? Peritoneal fluid labs  Patient has scheduled follow-up with Dr. Maudie Mercury on Friday, October 4 at 2:30 PM  Scattered Petechiae and inflammation throughout colon  [facility] initially consulted for ascites work up - paracentesis negative for cirrhotic cause, but suggested U/L endoscopy.  ? Upper GI was significant for bleeding site that was cauterized ? Patient started on PPI x 8 weeks ? Colonoscopy was significant for 10 mm polyp, removed and biopsied. With scattered petechiae and inflammation  ? Unsure of the cause of these findings. Will have to wait for biopsy results per GI.  ? Patient can follow-up with GI with biopsy findings and any further work-up.  Spoke to radiation oncology as curbside to ask if pelvic radiation was associated with pancytopenia, colitis, lymphatic blockage resulting in ascites.  There is sometimes bilateral lower extremity edema that is seen with lymphatic blockage with whole pelvic radiation, however, it is uncommon.  Otherwise the effects of radiation are typically localized to the prostate area.  Mechanical Mitral Valve Replacement On coumadin at home. INR goal of 2.5-3.5  PT 22.6, INR 2.01   Hemoglobin 9.9 this morning.  Patient is stable for lovenox (full therapeutic dosing) and bridge with 1mg  Coumadin for A. fib. With planned follow up at Coumadin clinic on Thursday. Will provide patient with enough Rx for 10 days as patient will likely need to be bridged in future as well.  Atrial Fibrillation, stable. Rate controlled.  heparin bridge as per above  Protein Calorie Malnutrition   Consult nutrition prior to d/c tday  AKI,improving  Crimproved from 1.84 from 2.03 yesterday (3.12 on admission).  GFR 35 from 31 yesterday. In October 2018 was 1.17 and 61 respectively  Follow-up BMP this Friday with INR draw. Can also repeat next Friday at appointment.  Restart PO 40 mg  9/24   Hold neprhotoxic agents when possible  Patient may need follow up if not improving   Pancytopenia, stable. Differential on CBC nonconcerning.  monitor CBC  o/p hematology consult  Hx of Prostate Cancer  Patient completed treatment years ago and follows [facility] onc[facility].   Monitor for obstructive symptoms  due for PSA in November 2019, repeat during admission due to concerns of malignancy given abdominal ascites  CAD s/p CABG, stable.  holding home ASA81 d/t AKI  Continue to hold aspirin.  Can put back on at follow-up next week.  FEN/GI: renal diet  PPx: lovenox bridgeto coumadin  Disposition: Will discuss today with Team   Medications: Scheduled Meds: . furosemide  40 mg Oral Daily  . pantoprazole  40 mg Oral BID   Continuous Infusions: . heparin 1,300 Units/hr (12/06/17 1530)   PRN Meds: polyethylene glycol  ================================================= ================================================= Subjective:  Patient reports that he does well overnight.  He reports that he would be okay with going home today.  He has a good understanding of the work-up that was done during his admission and that he will be following up at [facility].  He denies shortness of breath, dyspnea on exertion, difficulty walking.  Objective: Temp:  [97.7 F (36.5 C)-98.2 F (36.8 C)] 98 F (36.7 C) (09/24 0829) Pulse Rate:  [70-84] 77 (09/24 0829) Resp:  [16-25] 18 (09/24 0829) BP: (99-145)/(37-83) 123/72 (09/24 0829) SpO2:  [94 %-100 %] 98 % (09/24 0829) Weight:  [81.8 kg] 81.8 kg (09/23 2036) Intake/Output 09/23 0701 - 09/24 0700 In: 709.3 [P.O.:60; I.V.:649.3] Out: 1100 [Urine:1100]   Physical Exam:  Gen: NAD, alert, non-toxic, well-nourished, well-appearing, sitting comfortably  HEENT: Normocephaic, atraumatic. Clear conjuctiva, no scleral icterus and injection.  CV: Regular rate and rhythm.  Normal S1-S2.   Normal capillary  refill bilaterally.  Radial pulses 2+ bilaterally. No bilateral lower extremity edema. Resp: Decreased breath sounds on left side.  Good air movement on right.  No wheezing, rales, abnormal lung sounds.  No increased work of breathing appreciated. Abd: Nontender and nondistended on palpation to all 4 quadrants.  Positive bowel sounds. Psych: Cooperative with exam. Pleasant. Makes eye contact. Extremities: Full ROM.  Significant swelling in bilateral feet and lower ankles.  2 + pitting edema to the knees bilaterally.    Laboratory: Recent Labs  Lab 12/05/17 0341 12/06/17 0552 12/07/17 0730  WBC 2.7* 2.5* 3.1*  HGB 10.0* 10.7* 9.9*  HCT 29.0* 30.9* 28.8*  PLT 129* 133* 124*   Recent Labs  Lab 12/05/17 0341 12/06/17 0552 12/07/17 0730  NA 133* 133* 133*  K 3.6 3.4* 3.6  CL 97* 97* 100  CO2 26 27 25   BUN 32* 24* 18  CREATININE 2.71* 2.03* 1.84*  CALCIUM 8.1* 8.3* 8.4*  PROT 4.5* 4.9* 4.5*  BILITOT 0.7 0.9 0.8  ALKPHOS 48 61 53  ALT 25 29 23   AST 33 40 30  GLUCOSE 125* 113* 102*   Imaging/Diagnostic Tests:  EGD   Showed diffuse gastritis with evidence of recent bleeding.   Etiology of recent hemorrhage there was no significant underlying findings were noted.   Patient had 2 inflammatory polyps in the laser curvature of the stomach with evidence of  recent bleeding which were removed with hot snare and retried with the net.   Biopsies from gastric mucosa was taken.   Colonoscopy   scattered areas of inflammation and petechiae throughout the colon. Biopsies were taken.   10 mm rectal polyp which was removed with hot snare and one clip were placed for oozing of blood after polypectomy.   Internal hemorrhoids were found.   Wilber Oliphant, MD 12/07/2017, 9:09 AM PGY-1, [place] Intern pager: [phone], text pages welcome

## 2017-12-06 NOTE — Progress Notes (Signed)
[facility] Gastroenterology Progress Note  COE ANGELOS 72 y.o. 1946-01-10  CC:  Anasarca/ascites   Subjective: no acute GI issues this morning. No bleeding episodes during prep for colonoscopy. Denies abdominal pain, nausea vomiting.     Objective: Vital signs in last 24 hours: Vitals:   12/05/17 2032 12/06/17 0447  BP: (!) 152/95 (!) 132/114  Pulse: 88 (!) 101  Resp: 18 18  Temp: 97.6 F (36.4 C) 98.1 F (36.7 C)  SpO2: 100% 99%    Physical Exam:  Gen. Alert/oriented 3. NAD,  Hard of hearing Abdomen. Soft, nontender, nondistended, bowel sounds present. Lower extremity. Chronic lymphedema noted.  Lab Results: Recent Labs    12/05/17 0341 12/06/17 0552  NA 133* 133*  K 3.6 3.4*  CL 97* 97*  CO2 26 27  GLUCOSE 125* 113*  BUN 32* 24*  CREATININE 2.71* 2.03*  CALCIUM 8.1* 8.3*   Recent Labs    12/05/17 0341 12/06/17 0552  AST 33 40  ALT 25 29  ALKPHOS 48 61  BILITOT 0.7 0.9  PROT 4.5* 4.9*  ALBUMIN 2.1* 2.3*   Recent Labs    12/04/17 0517 12/05/17 0341 12/06/17 0552  WBC 2.0* 2.7* 2.5*  NEUTROABS 1.5* 1.8  --   HGB 10.5* 10.0* 10.7*  HCT 30.7* 29.0* 30.9*  MCV 99.0 98.0 98.4  PLT 123* 129* 133*   Recent Labs    12/05/17 0341 12/06/17 0552  LABPROT 20.2* 20.6*  INR 1.74 1.79      Assessment/Plan: - ascites. Status post paracentesis. Negative for SBP. SAAG 0.5, not consistent with portal hypertension. Unfortunately, total protein count was not able to calculate from acetic fluid.ultrasound showed questionable cirrhosis with CT without contrast showed normal-appearing liver. 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 Recent echo relatively normal. ??etiology. - history of mechanical heart valve and paroxysmal atrial fibrillation. Heparin drip on hold since morning for procedure today. INR 1.79. - Anemia. No overt bleeding. - elevated kidney functions.  Recommendations ------------------------- - Proceed with EGD and colonoscopy  today. - if EGD and colonoscopy negative, consider outpatient workup for his ascites/lymphedema. He has normal LFTs but low albumin and borderline low platelet counts.  Risks (bleeding, infection, bowel perforation that could require surgery, sedation-related changes in cardiopulmonary systems), benefits (identification and possible treatment of source of symptoms, exclusion of certain causes of symptoms), and alternatives (watchful waiting, radiographic imaging studies, empiric medical treatment)  were explained to patient/family in detail and patient wishes to proceed.   Otis Brace MD, FACP 12/06/2017, 9:22 AM  Contact #  [phone]

## 2017-12-06 NOTE — Progress Notes (Addendum)
Family Medicine Teaching Service Daily Progress Note Intern Pager: [phone]  Patient name: Mario Powell Medical record number: [id number] Date of birth: Jan 06, 1946 Age: 72 y.o. Gender: male  Primary Care Provider: Patient, No Pcp Per Consultants: GI Code Status: Full code  Pt Overview and Major Events to Date:  Hospital Day 5 Admitted: 12/01/2017 9/20 paracentesis  Assessment and Plan: Mario Powell is a 72 year old male with progressive anasarca. PMH is significant forAfib, MV replacement with mechanical valve on Coumadin, CAD s/p CABG, HTN, h/o prostate cancer.   Anasarca with ascites, unclear etiology.  S/p paracentesis, removed 5L chylous fluid, TG 785. Most concerning for malignancy especially given h/o prostate CA with radiation in 2008.   Mario Powell GI following, appreciate recommendations.   EGD today, f/u resutls   Follow up PSA   strict I/Os, daily weights  f/u peritoneal Cx labs  Pancytopenia, stable. Differential on CBC nonconcerning.    monitor CBC  Consider o/p hematology consult  AKI, unchanged Cr 2.03 from 2.71 & GFR 31 yesterday. In October 2018 was 1.17 and 61 respectively  monitor Cr closely  S/p IV diuresis 80 9/20 and 9/22. Held 9/23.   Restart PO 40 mg today    Hold neprhotoxic agents when possible  CAD s/p CABG, stable.  holding home ASA81 d/t AKI  Mechanical Mitral Valve Replacement On coumadin at home. INR goal of 2.5-3.5, reversed with Vit K 2.5mg  x1 on 12/02/17 for procedure.  PT 20.6, INR 1.79   Holding heparin for GI procedure  heparin bridge to coumadin  Atrial Fibrillation, stable.  Rate controlled.  heparin bridge as per above  Hx of Prostate Cancer  Patient completed treatment years ago and follows with oncologist at [facility] annually.   Monitor for obstructive symptoms   due for PSA in November 2019, repeat during admission due to concerns of malignancy given abdominal ascites  FEN/GI: renal diet  PPx:  heparin bridge to coumadin  Disposition: pending further work up . heparin Stopped (12/06/17 6578)   Medications: Scheduled Meds: . warfarin  1.5 mg Oral ONCE-1800  . Warfarin - Pharmacist Dosing Inpatient   Does not apply q1800   Continuous Infusions: . heparin Stopped (12/06/17 0644)   PRN Meds: polyethylene glycol  ================================================= ================================================= Subjective:  Reports doing well this morning. Reports that his GI procedure has been postponed to noon since he was not taken off of his heparin until 6am.  Objective: Temp:  [97.5 F (36.4 C)-98.1 F (36.7 C)] 97.8 F (36.6 C) (09/23 0929) Pulse Rate:  [68-101] 80 (09/23 0929) Resp:  [18] 18 (09/23 0929) BP: (122-152)/(83-114) 122/83 (09/23 0929) SpO2:  [98 %-100 %] 98 % (09/23 0929) Weight:  [81.7 kg] 81.7 kg (09/22 2032) Intake/Output 09/22 0701 - 09/23 0700 In: 1868.5 [P.O.:1560; I.V.:308.5] Out: 2400 [Urine:2400] Physical Exam:  Gen: NAD, alert, non-toxic, well-nourished, well-appearing, sitting comfortably  HEENT: Normocephaic, atraumatic. Clear conjuctiva, no scleral icterus and injection.  CV: Regular rate and rhythm.  Normal S1-S2.   Normal capillary refill bilaterally.  Radial pulses 2+ bilaterally. Resp: Clear to auscultation bilaterally.  No wheezing, rales, abnormal lung sounds.  No increased work of breathing appreciated. Abd: Nontender and nondistended on palpation to all 4 quadrants.  Positive bowel sounds.  Psych: Cooperative with exam. Pleasant. Makes eye contact. Extremities: Full ROM, 2+ BLEE.    Laboratory: Recent Labs  Lab 12/04/17 0517 12/05/17 0341 12/06/17 0552  WBC 2.0* 2.7* 2.5*  HGB 10.5* 10.0* 10.7*  HCT 30.7* 29.0* 30.9*  PLT 123* 129* 133*   Recent Labs  Lab 12/04/17 0517 12/05/17 0341 12/06/17 0552  NA 133* 133* 133*  K 4.1 3.6 3.4*  CL 100 97* 97*  CO2 26 26 27   BUN 32* 32* 24*  CREATININE 2.53* 2.71* 2.03*   CALCIUM 8.4* 8.1* 8.3*  PROT 4.6* 4.5* 4.9*  BILITOT 0.9 0.7 0.9  ALKPHOS 55 48 61  ALT 34 25 29  AST 42* 33 40  GLUCOSE 122* 125* 113*   Future Labs:  CMP tomorrow AM  Imaging/Diagnostic Tests: Ct Abdomen Wo Contrast  Result Date: 12/04/2017 CLINICAL DATA:  72 year old male with abnormal LFTs. EXAM: CT ABDOMEN WITHOUT CONTRAST TECHNIQUE: Multidetector CT imaging of the abdomen was performed following the standard protocol without IV contrast. COMPARISON:  01/18/2007 CT FINDINGS: Please note that parenchymal abnormalities may be missed without intravenous contrast. Lower chest: A moderate to large LEFT pleural effusion is noted. Mitral valve replacement identified. Hepatobiliary: The liver and gallbladder are unremarkable. No definite biliary dilatation. Pancreas: Unremarkable Spleen: Unremarkable Adrenals/Urinary Tract: The kidneys and adrenal glands are unremarkable. Stomach/Bowel: The visualized bowel is unremarkable. No bowel distention noted. Vascular/Lymphatic: Aortic atherosclerosis. No enlarged abdominal lymph nodes. Other: A moderate amount of ascites is noted within the abdomen. No evidence of pneumoperitoneum. Subcutaneous edema identified. Musculoskeletal: No acute or suspicious bony abnormalities. IMPRESSION: 1. Moderate to large LEFT pleural effusion, moderate ascites and subcutaneous edema. 2. No hepatic abnormality identified on this noncontrast scan. 3.  Aortic Atherosclerosis (ICD10-I70.0). Electronically Signed   By: Margarette Canada M.D.   On: 12/04/2017 16:28      Wilber Oliphant, MD 12/06/2017, 9:29 AM PGY-1, [place] Intern pager: [phone], text pages welcome

## 2017-12-06 NOTE — Progress Notes (Signed)
Call placed to Dr. [name], advised heparin drip still infusing at this time. Patient has EGD and colonoscopy scheduled at 0830 today. Verbal order received from Dr. [name] to stop heparin infusion now and move procedure to 1300 so heparin can be off for 6 hours. This information communicated with patient primary RN Gerald Stabs. He expressed understanding and states will stop heparin now.

## 2017-12-06 NOTE — Brief Op Note (Addendum)
12/01/2017 - 12/06/2017  2:10 PM  PATIENT:  Mario Powell  72 y.o. male  PRE-OPERATIVE DIAGNOSIS:  abnormal CT  POST-OPERATIVE DIAGNOSIS:  gastric polyps s/p snare gastric erosions inflammation of colon s/p random biopsy rectal polyp hot snare  PROCEDURE:  Procedure(s): ESOPHAGOGASTRODUODENOSCOPY (EGD) WITH PROPOFOL (N/A) COLONOSCOPY WITH PROPOFOL hemestasis clip applied (N/A) BIOPSY POLYPECTOMY  SURGEON:  Surgeon(s) and Role:    * Krishana Lutze, MD - Primary  Findings ------------ - EGD showed diffuse gastritis with evidence of recent bleeding. Lavage of those areas with hemorrhages howed no significant underlying findings  Patient had 2 inflammatory polyps in the laser curvature of the stomach with evidence of recent bleeding which were removed with hot snare and retried with the net. Biopsies from gastric mucosa was taken.  had oozing from biopsy site which stopped spontaneously.no evidence of varices.  - colonoscopy showed scattered areas of inflammation and petechiae throughout the colon. Biopsies were taken. It also showed a 10 mm rectal polyp which was removed with hot snare and one clip were placed for oozing of blood after polypectomy. Internal hemorrhoids were found.  Recommendations --------------------------- - Start full liquid diet - start twice a day PPI - Okay to resume heparin drip. If hemoglobin stable tomorrow, he can go back on Coumadin. - Recheck CBC in the morning - GI will follow  Otis Brace MD, FACP 12/06/2017, 2:13 PM  Contact #  [phone]

## 2017-12-06 NOTE — Op Note (Signed)
[facility] Patient Name: Mario Powell Procedure Date : 12/06/2017 MRN: [id number] Attending MD: Otis Brace , MD Date of Birth: [birth year] CSN: [id number] Age: 72 Admit Type: Inpatient Procedure:                Upper GI endoscopy Indications:              Suspected upper gastrointestinal bleeding in                            patient with unexplained iron deficiency anemia,                            Abnormal CT of the GI tract Providers:                Otis Brace, MD, Burtis Junes, RN, Alan Mulder,                            Technician Referring MD:              Medicines:                Sedation Administered by an Anesthesia Professional Complications:            No immediate complications. Estimated Blood Loss:     Estimated blood loss was minimal. Procedure:                Pre-Anesthesia Assessment:                           - Prior to the procedure, a History and Physical                            was performed, and patient medications and                            allergies were reviewed. [name] tolerance of                            previous anesthesia was also reviewed. The risks                            and benefits of the procedure and the sedation                            options and risks were discussed with the patient.                            All questions were answered, and informed consent                            was obtained. Prior Anticoagulants: The patient                            last took Coumadin (warfarin) 2 days prior to the  procedure and last took heparin on the day of the                            procedure. ASA Grade Assessment: III - A patient                            with severe systemic disease. After reviewing the                            risks and benefits, the patient was deemed in                            satisfactory condition to undergo the procedure.                            After obtaining informed consent, the endoscope was                            passed under direct vision. Throughout the                            procedure, the patient's blood pressure, pulse, and                            oxygen saturations were monitored continuously. The                            [id number]-H190 ([id number]) Olympus adult EGD was introduced                            through the mouth, and advanced to the second part                            of duodenum. The upper GI endoscopy was                            accomplished without difficulty. The patient                            tolerated the procedure well. Scope In: Scope Out: Findings:      The Z-line was regular and was found 39 cm from the incisors.      There is no endoscopic evidence of varices in the entire esophagus.      Diffuse moderate inflammation with hemorrhage characterized by       congestion (edema), erosions, erythema and linear erosions was found in       the entire examined stomach. Biopsies were taken with a cold forceps for       histology. patient had oozing from biopsy site which stopped       spontaneously.      Two 10 mm semi-sessile polyps with no bleeding and stigmata of recent       bleeding were found on the lesser curvature of the stomach. These polyps       were removed with a hot  snare. Resection and retrieval were complete.       polyps were retrieved using net.      The duodenal bulb, first portion of the duodenum and second portion of       the duodenum were normal. Impression:               - Z-line regular, 39 cm from the incisors.                           - Chronic gastritis with hemorrhage. Biopsied.                           - Two gastric polyps. Resected and retrieved.                           - Normal duodenal bulb, first portion of the                            duodenum and second portion of the duodenum. Recommendation:           - Perform a colonoscopy  today. Procedure Code(s):        --- Professional ---                           [phone], Esophagogastroduodenoscopy, flexible,                            transoral; with removal of tumor(s), polyp(s), or                            other lesion(s) by snare technique                           43239, 65, Esophagogastroduodenoscopy, flexible,                            transoral; with biopsy, single or multiple Diagnosis Code(s):        --- Professional ---                           K29.51, Unspecified chronic gastritis with bleeding                           K31.7, Polyp of stomach and duodenum                           D50.9, Iron deficiency anemia, unspecified                           R93.3, Abnormal findings on diagnostic imaging of                            other parts of digestive tract CPT copyright 2017 American Medical Association. All rights reserved. The codes documented in this report are preliminary and upon coder review may  be revised to meet current compliance requirements. Otis Brace, MD Otis Brace, MD 12/06/2017 1:58:40 PM Number of  Addenda: 0

## 2017-12-06 NOTE — Transfer of Care (Signed)
Immediate Anesthesia Transfer of Care Note  Patient: Mario Powell  Procedure(s) Performed: ESOPHAGOGASTRODUODENOSCOPY (EGD) WITH PROPOFOL (N/A ) COLONOSCOPY WITH PROPOFOL hemestasis clip applied (N/A ) BIOPSY POLYPECTOMY  Patient Location: Endoscopy Unit  Anesthesia Type:MAC  Level of Consciousness: sedated  Airway & Oxygen Therapy: Patient Spontanous Breathing and Patient connected to nasal cannula oxygen  Post-op Assessment: Report given to RN and Post -op Vital signs reviewed and stable  Post vital signs: Reviewed and stable  Last Vitals:  Vitals Value Taken Time  BP    Temp    Pulse    Resp 16 12/06/2017  1:50 PM  SpO2      Last Pain:  Vitals:   12/06/17 1350  TempSrc: Axillary  PainSc:          Complications: No apparent anesthesia complications

## 2017-12-07 ENCOUNTER — Encounter (HOSPITAL_COMMUNITY): Payer: Self-pay | Admitting: Gastroenterology

## 2017-12-07 ENCOUNTER — Inpatient Hospital Stay (HOSPITAL_COMMUNITY): Payer: Medicare Other

## 2017-12-07 DIAGNOSIS — K317 Polyp of stomach and duodenum: Secondary | ICD-10-CM

## 2017-12-07 LAB — COMPREHENSIVE METABOLIC PANEL
ALT: 23 U/L (ref 0–44)
ANION GAP: 8 (ref 5–15)
AST: 30 U/L (ref 15–41)
Albumin: 2.1 g/dL — ABNORMAL LOW (ref 3.5–5.0)
Alkaline Phosphatase: 53 U/L (ref 38–126)
BILIRUBIN TOTAL: 0.8 mg/dL (ref 0.3–1.2)
BUN: 18 mg/dL (ref 8–23)
CO2: 25 mmol/L (ref 22–32)
Calcium: 8.4 mg/dL — ABNORMAL LOW (ref 8.9–10.3)
Chloride: 100 mmol/L (ref 98–111)
Creatinine, Ser: 1.84 mg/dL — ABNORMAL HIGH (ref 0.61–1.24)
GFR calc Af Amer: 40 mL/min — ABNORMAL LOW (ref >60)
GFR, EST NON AFRICAN AMERICAN: 35 mL/min — AB (ref >60)
GLUCOSE: 102 mg/dL — AB (ref 70–99)
POTASSIUM: 3.6 mmol/L (ref 3.5–5.1)
Sodium: 133 mmol/L — ABNORMAL LOW (ref 135–145)
Total Protein: 4.5 g/dL — ABNORMAL LOW (ref 6.5–8.1)

## 2017-12-07 LAB — PROTIME-INR
INR: 2.01
PROTHROMBIN TIME: 22.6 s — AB (ref 11.4–15.2)

## 2017-12-07 LAB — CBC
HEMATOCRIT: 28.8 % — AB (ref 39.0–52.0)
HEMOGLOBIN: 9.9 g/dL — AB (ref 13.0–17.0)
MCH: 34.5 pg — AB (ref 26.0–34.0)
MCHC: 34.4 g/dL (ref 30.0–36.0)
MCV: 100.3 fL — ABNORMAL HIGH (ref 78.0–100.0)
Platelets: 124 10*3/uL — ABNORMAL LOW (ref 150–400)
RBC: 2.87 MIL/uL — ABNORMAL LOW (ref 4.22–5.81)
RDW: 13.2 % (ref 11.5–15.5)
WBC: 3.1 10*3/uL — ABNORMAL LOW (ref 4.0–10.5)

## 2017-12-07 LAB — PSA, TOTAL AND FREE
PSA FREE PCT: 36 %
PSA FREE: 0.18 ng/mL
Prostate Specific Ag, Serum: 0.5 ng/mL (ref 0.0–4.0)

## 2017-12-07 LAB — HEMOGLOBIN AND HEMATOCRIT, BLOOD
HCT: 31.6 % — ABNORMAL LOW (ref 39.0–52.0)
Hemoglobin: 10.7 g/dL — ABNORMAL LOW (ref 13.0–17.0)

## 2017-12-07 LAB — HEPARIN LEVEL (UNFRACTIONATED): Heparin Unfractionated: 0.32 IU/mL (ref 0.30–0.70)

## 2017-12-07 MED ORDER — ENOXAPARIN SODIUM 120 MG/0.8ML ~~LOC~~ SOLN
120.0000 mg | SUBCUTANEOUS | Status: DC
  Administered 2017-12-07 – 2017-12-08 (×2): 120 mg via SUBCUTANEOUS
  Filled 2017-12-07 (×2): qty 0.8

## 2017-12-07 MED ORDER — WARFARIN - PHARMACIST DOSING INPATIENT: Freq: Every day | Status: DC

## 2017-12-07 MED ORDER — WARFARIN SODIUM 1 MG PO TABS
1.5000 mg | ORAL_TABLET | Freq: Once | ORAL | Status: AC
  Administered 2017-12-07: 1.5 mg via ORAL
  Filled 2017-12-07: qty 1

## 2017-12-07 NOTE — Progress Notes (Signed)
Spoke with Dr Jeannine Kitten on resident shift this evening and talked about the MRI's that were ordered.  I talked with Dr Weber Cooks also about this, and there are two concerns.  MRI would not really show the lymphatic system with any details, and this afternoon, pt had a endoscopy in which they placed a clip which is MRI incompatible for 8 weeks (presumably, after the clip sloughs off later).  I gave Dr Jeannine Kitten Dr Entrikin's number if he had any questions or needed further guidance so they could order the right procedure for the patient.  Thank you.  MRI's at this time will be cancelled.

## 2017-12-07 NOTE — Progress Notes (Addendum)
[facility] Gastroenterology Progress Note  Mario Powell 72 y.o. [birth year]  CC:  Anasarca/ascites   Subjective: no acute GI issues this morning. No bleeding episodes  Denies abdominal pain, nausea vomiting.  EGD and colonoscopy findings discussed again this morning with the patient.  ROS : Negative for chest pain and shortness of breath.  Negative for weakness.  Objective: Vital signs in last 24 hours: Vitals:   12/07/17 0447 12/07/17 0829  BP: 117/68 123/72  Pulse: 78 77  Resp: 18 18  Temp: 98.2 F (36.8 C) 98 F (36.7 C)  SpO2: 100% 98%    Physical Exam:  Gen. Alert/oriented 3. NAD,  Hard of hearing Lungs.  Minimal bibasilar crackles,  no respiratory distress. Heart : Rate rhythm regular, no murmur Abdomen. Soft, nontender, nondistended, bowel sounds present. Lower extremity. Chronic lymphedema noted.  Lab Results: Recent Labs    12/06/17 0552 12/07/17 0730  NA 133* 133*  K 3.4* 3.6  CL 97* 100  CO2 27 25  GLUCOSE 113* 102*  BUN 24* 18  CREATININE 2.03* 1.84*  CALCIUM 8.3* 8.4*   Recent Labs    12/06/17 0552 12/07/17 0730  AST 40 30  ALT 29 23  ALKPHOS 61 53  BILITOT 0.9 0.8  PROT 4.9* 4.5*  ALBUMIN 2.3* 2.1*   Recent Labs    12/05/17 0341 12/06/17 0552 12/07/17 0730  WBC 2.7* 2.5* 3.1*  NEUTROABS 1.8  --   --   HGB 10.0* 10.7* 9.9*  HCT 29.0* 30.9* 28.8*  MCV 98.0 98.4 100.3*  PLT 129* 133* 124*   Recent Labs    12/06/17 0552 12/07/17 0730  LABPROT 20.6* 22.6*  INR 1.79 2.01   Findings ------------ - EGD showed diffuse gastritis with evidence of recent bleeding. Lavage of those areas with hemorrhages howed no significant underlying findings . Patient had 2 inflammatory polyps in the laser curvature of the stomach with evidence of recent bleeding which were removed with hot snare and retried with the net. Biopsies from gastric mucosa was taken.  had oozing from biopsy site which stopped spontaneously.no evidence of varices.  - colonoscopy  showed scattered areas of inflammation and petechiae throughout the colon. Biopsies were taken. It also showed a 10 mm rectal polyp which was removed with hot snare and one clip were placed for oozing of blood after polypectomy. Internal hemorrhoids were found.   Assessment/Plan: - ascites. Status post paracentesis. Negative for SBP. SAAG 0.5, not consistent with portal hypertension. Unfortunately, total protein count was not able to calculate from acetic fluid.ultrasound showed questionable cirrhosis with CT without contrast showed normal-appearing liver. 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 Recent echo relatively normal. ??etiology. - history of mechanical heart valve and paroxysmal atrial fibrillation. Heparin drip on hold since morning for procedure today. INR 2.01  - Anemia. No overt bleeding. - elevated kidney functions.  Recommendations ------------------------- - Hemoglobin relatively stable.  No active bleeding while on heparin drip. - ok to Restart Coumadin from GI standpoint. - Recheck H&H later today at 1 PM. - Advance diet to soft diet. -Continue twice daily PPI for at least 4 weeks followed by once a day PPI for additional 4 weeks.  - if Hemoglobin stable, okay to discharge from GI standpoint. -Follow-up with Dr. Penelope Coop in 3 to 4 weeks after discharge. -GI will sign off.  Call us back if needed  Otis Brace MD, [place] 12/07/2017, 9:13 AM  Contact #  [phone]

## 2017-12-07 NOTE — Progress Notes (Signed)
Spoke with patient and daughter about how MRI would not be possible at this point due to clip that was just placed and about how radiologist didn't think it would be a revealing test regardless. Radiologist did not have a better test for visualizing his lymphatics and suggested CT pelvis as an alternative. [name]?) wants to be here for discussion after our rounds tomorrow and would like to be called if there is any new information to discuss regarding patient's treatment while she isn't at the hospital.

## 2017-12-07 NOTE — Progress Notes (Signed)
Interim progress note   Patient d/c'ed from heparin drip and started on lovenox 120mg  q24hours (1.5 mg/kg) and 1.5 mg coumadin dose tonight at 6pm. Patient's PTA dose will change at discharge with close follow up with Card[facility] on Friday the 26th.    Pt will get MRI this evening of chest, abdomen, pelvis w/ contrast for possible lymphatic blockage.   Zettie Cooley, M.D. 12/07/2017, 4:55 PM PGY-1, [place]

## 2017-12-07 NOTE — Progress Notes (Signed)
ANTICOAGULATION CONSULT NOTE - Follow Up Consult  Pharmacy Consult for Heparin/Coumadin Indication: mechanical MVR and afib  Allergies  Allergen Reactions  . Penicillins Other (See Comments)    REACTION: UNKNOWN    Patient Measurements: Height: 5\' 11"  (180.3 cm) Weight: 180 lb 5.4 oz (81.8 kg) IBW/kg (Calculated) : 75.3  Vital Signs: Temp: 98 F (36.7 C) (09/24 0829) Temp Source: Oral (09/24 0829) BP: 123/72 (09/24 0829) Pulse Rate: 77 (09/24 0829)  Labs: Recent Labs    12/05/17 0341 12/06/17 0552 12/07/17 0730  HGB 10.0* 10.7* 9.9*  HCT 29.0* 30.9* 28.8*  PLT 129* 133* 124*  LABPROT 20.2* 20.6* 22.6*  INR 1.74 1.79 2.01  HEPARINUNFRC 0.53 0.56 0.32  CREATININE 2.71* 2.03* 1.84*    Estimated Creatinine Clearance: 38.1 mL/min (A) (by C-G formula based on SCr of 1.84 mg/dL (H)).  Assessment: 72 yo M off Coumadin for paracentesis (done 9/20) and EGD/colonoscopy (9/23).  Pt is being bridged with heparin given hx mechanical MVR and INR goal 2.5-3.5.  Per GI, OK to restart Coumadin 9/24 if Hgb stable. Hgb down a bit to 9.9 but relatively stable so coumadin will restart tonight. Plt low but stable. No bleeding noted.  Heparin level therapeutic (0.32) on gtt at 1300 units/hr.  INR down to 1.84 today. Admit INR high at 3.87 - held and reversed with Vit K 2.5 mg IV x 1 on 9/19. Last coumadin dose 9/18 pre-admission. * PTA dose 1mg  daily except 0.5mg  on Mondays and Fridays  Goal of Therapy:  INR goal 2.5-3.5 Heparin level 0.3-0.7 units/ml Monitor platelets by anticoagulation protocol: Yes   Plan:  Continue heparin at 1300 units/hr.  Coumadin 1.5 mg po tonight Heparin level, CBC, and INR daily.   Sherlon Handing, PharmD, BCPS Clinical pharmacist  **Pharmacist phone directory can now be found on [email or web address] ([email or web address]).  Listed under [place]. 12/07/2017 9:45 AM

## 2017-12-08 DIAGNOSIS — I898 Other specified noninfective disorders of lymphatic vessels and lymph nodes: Secondary | ICD-10-CM

## 2017-12-08 DIAGNOSIS — E43 Unspecified severe protein-calorie malnutrition: Secondary | ICD-10-CM

## 2017-12-08 DIAGNOSIS — I890 Lymphedema, not elsewhere classified: Secondary | ICD-10-CM

## 2017-12-08 LAB — CBC
HCT: 28.8 % — ABNORMAL LOW (ref 39.0–52.0)
HEMOGLOBIN: 9.7 g/dL — AB (ref 13.0–17.0)
MCH: 34.3 pg — ABNORMAL HIGH (ref 26.0–34.0)
MCHC: 33.7 g/dL (ref 30.0–36.0)
MCV: 101.8 fL — ABNORMAL HIGH (ref 78.0–100.0)
Platelets: 126 10*3/uL — ABNORMAL LOW (ref 150–400)
RBC: 2.83 MIL/uL — AB (ref 4.22–5.81)
RDW: 13.5 % (ref 11.5–15.5)
WBC: 2 10*3/uL — ABNORMAL LOW (ref 4.0–10.5)

## 2017-12-08 LAB — CULTURE, BODY FLUID W GRAM STAIN -BOTTLE: Culture: NO GROWTH

## 2017-12-08 LAB — PROTIME-INR
INR: 2.03
PROTHROMBIN TIME: 22.8 s — AB (ref 11.4–15.2)

## 2017-12-08 LAB — BASIC METABOLIC PANEL
Anion gap: 6 (ref 5–15)
BUN: 18 mg/dL (ref 8–23)
CHLORIDE: 98 mmol/L (ref 98–111)
CO2: 29 mmol/L (ref 22–32)
CREATININE: 1.96 mg/dL — AB (ref 0.61–1.24)
Calcium: 8.4 mg/dL — ABNORMAL LOW (ref 8.9–10.3)
GFR, EST AFRICAN AMERICAN: 37 mL/min — AB (ref >60)
GFR, EST NON AFRICAN AMERICAN: 32 mL/min — AB (ref >60)
Glucose, Bld: 105 mg/dL — ABNORMAL HIGH (ref 70–99)
Potassium: 3.8 mmol/L (ref 3.5–5.1)
SODIUM: 133 mmol/L — AB (ref 135–145)

## 2017-12-08 MED ORDER — ENOXAPARIN SODIUM 120 MG/0.8ML ~~LOC~~ SOLN: 120.0000 mg | SUBCUTANEOUS | 0 refills | Status: DC

## 2017-12-08 MED ORDER — MEDIUM CHAIN TRIGLYCERIDES PO OIL: 15.0000 mL | TOPICAL_OIL | Freq: Three times a day (TID) | ORAL | 12 refills | Status: AC

## 2017-12-08 MED ORDER — PRO-STAT SUGAR FREE PO LIQD
30.0000 mL | Freq: Three times a day (TID) | ORAL | Status: DC
  Administered 2017-12-08: 30 mL via ORAL
  Filled 2017-12-08: qty 30

## 2017-12-08 MED ORDER — ADULT MULTIVITAMIN W/MINERALS CH: 1.0000 | ORAL_TABLET | Freq: Every day | ORAL | 3 refills | Status: AC

## 2017-12-08 MED ORDER — FUROSEMIDE 40 MG PO TABS: 40.0000 mg | ORAL_TABLET | ORAL | 0 refills | Status: DC

## 2017-12-08 MED ORDER — PRO-STAT SUGAR FREE PO LIQD: 30.0000 mL | Freq: Three times a day (TID) | ORAL | 0 refills | Status: AC

## 2017-12-08 MED ORDER — PANTOPRAZOLE SODIUM 40 MG PO TBEC: 40.0000 mg | DELAYED_RELEASE_TABLET | Freq: Two times a day (BID) | ORAL | 0 refills | Status: DC

## 2017-12-08 MED ORDER — WARFARIN SODIUM 1 MG PO TABS
1.0000 mg | ORAL_TABLET | Freq: Once | ORAL | Status: DC
  Filled 2017-12-08: qty 1

## 2017-12-08 MED ORDER — MEDIUM CHAIN TRIGLYCERIDES PO OIL
15.0000 mL | TOPICAL_OIL | Freq: Three times a day (TID) | ORAL | Status: DC
  Filled 2017-12-08: qty 15

## 2017-12-08 MED ORDER — ADULT MULTIVITAMIN W/MINERALS CH
1.0000 | ORAL_TABLET | Freq: Every day | ORAL | Status: DC
  Administered 2017-12-08: 1 via ORAL
  Filled 2017-12-08: qty 1

## 2017-12-08 MED ORDER — BOOST / RESOURCE BREEZE PO LIQD CUSTOM
1.0000 | Freq: Three times a day (TID) | ORAL | Status: DC
  Administered 2017-12-08: 1 via ORAL
  Filled 2017-12-08 (×2): qty 1

## 2017-12-08 NOTE — Progress Notes (Signed)
Initial Nutrition Assessment  DOCUMENTATION CODES:   Severe malnutrition in context of acute illness/injury  INTERVENTION:   Recommend High Protein, Very Low Fat Diet with MCT oil supplementation with close follow-up to ensure adequate nutrition  If pt to require prolonged restricted diet and/or po intake inadequate due to diet restriction, pt may require nutrition support  Add MVI with minerals daily  Recommend MCT oil 1 T 3 to 4 times/day  Boost Breeze po TID, each supplement provides 250 kcal and 9 grams of protein (FAT FREE supplement)  Pro-Stat 30 mL daily (FAT FREE supplement)   NUTRITION DIAGNOSIS:   Severe Malnutrition related to acute illness(AKI, significant ascites (chylous) and , diffuse anasarca, possible malignancy) as evidenced by energy intake < 75% for > 7 days, moderate fat depletion, moderate muscle depletion, edema.  GOAL:   Patient will meet greater than or equal to 90% of their needs  MONITOR:   PO intake, Supplement acceptance, Labs, Weight trends  REASON FOR ASSESSMENT:   Consult Assessment of nutrition requirement/status  ASSESSMENT:    72 yo male admitted with progressive increased lef swelling x 4 weeks with diffuse anasarca, AKI, ascites with unknown etiology, concern for malignancy. PMH includes CAD/CABG, HTN, MV replacement, prostate cancer   9/20 Paracentesis with 5L chylous fluid removed 9/23 EGD with chronic gastritis with hemorrhage, two gastric polyps; Colonoscopy with Erythematous, hemorrhagic, inflamed and petechial mucosa in the entire examined colon  Pt reports very good appetite at present; reports he has eaten the best here in the hospital than he has in a long time. Pt reports he typically eats a banana, dry cereal and 1/2 cup of juice at breakfast. Lunch might be a deli sandwich and dinner might be a Chicken Breast Dinner from [place] or [employer] or [place] or [place]. Pt reports decline in po intake 1 month ago with  abdominal swelling began. Pt reports with the significant weight gain (reports it was 10 pounds overnight); pt began limiting his po intake. Pt would still eat banana and cereal for breakfast but only maybe a 1/2 can of beans for dinner or maybe a small portion of instant rice. Pt has not been taking any oral nutrition supplements  Chylous fluid in peritoneal cavity; MD requesting appropriate diet. Based on limited evidenced, it is recommended for a  very low fat, high protein diet with addition of MVI with minerals and MCT oil supplementation  Weight down significantly post paracentesis on 9/20 ;weight has been stable since; 81.8 kg at present Pt with significant LE edema; pt reports he has had this since 2014 when he had his MV repair. Pt indicates that his legs "appear the smallest" they have in years since coming to hospital.   Labs: sodium 133, Creatinine 1.96, BUN wdl Meds: lasix    NUTRITION - FOCUSED PHYSICAL EXAM:    Most Recent Value  Orbital Region  Mild depletion  Upper Arm Region  Moderate depletion  Thoracic and Lumbar Region  No depletion  Buccal Region  Mild depletion  Temple Region  Moderate depletion  Clavicle Bone Region  Moderate depletion  Clavicle and Acromion Bone Region  Moderate depletion  Scapular Bone Region  Mild depletion  Dorsal Hand  Mild depletion  Patellar Region  Mild depletion  Anterior Thigh Region  Mild depletion  Posterior Calf Region  Unable to assess  Edema (RD Assessment)  Severe       Diet Order:   Diet Order  Diet Heart Room service appropriate? Yes; Fluid consistency: Thin  Diet effective now              EDUCATION NEEDS:   Education needs have been addressed  Skin:  Skin Assessment: Reviewed RN Assessment  Last BM:  9/25  Height:   Ht Readings from Last 1 Encounters:  12/03/17 5\' 11"  (1.803 m)    Weight:   Wt Readings from Last 1 Encounters:  12/06/17 81.8 kg    Ideal Body Weight:     BMI:  Body mass  index is 25.15 kg/m.  Estimated Nutritional Needs:   Kcal:  2000-2200 kcals   Protein:  125-140 g   Fluid:  >/= 1.5 L   Kerman Passey MS, RD, LDN, CNSC [phone] Pager  [phone] Weekend/On-Call Pager

## 2017-12-08 NOTE — Progress Notes (Signed)
ANTICOAGULATION CONSULT NOTE - Follow Up Consult  Pharmacy Consult for Heparin/Coumadin Indication: mechanical MVR and afib  Allergies  Allergen Reactions  . Penicillins Other (See Comments)    REACTION: UNKNOWN    Patient Measurements: Height: 5\' 11"  (180.3 cm) Weight: 180 lb 5.4 oz (81.8 kg) IBW/kg (Calculated) : 75.3  Vital Signs: Temp: 98.1 F (36.7 C) (09/25 0808) Temp Source: Oral (09/25 0808) BP: 114/69 (09/25 0808) Pulse Rate: 70 (09/25 0808)  Labs: Recent Labs    12/06/17 0552 12/07/17 0730 12/07/17 1315 12/08/17 0536  HGB 10.7* 9.9* 10.7* 9.7*  HCT 30.9* 28.8* 31.6* 28.8*  PLT 133* 124*  --  126*  LABPROT 20.6* 22.6*  --  22.8*  INR 1.79 2.01  --  2.03  HEPARINUNFRC 0.56 0.32  --   --   CREATININE 2.03* 1.84*  --  1.96*    Estimated Creatinine Clearance: 35.8 mL/min (A) (by C-G formula based on SCr of 1.96 mg/dL (H)).  Assessment: 72 yo M on Coumadin 1mg  daily exc 0.5mg  on Mon/Fri PTA for hx mech MVR/Afib (INR goal 2.5-3.5). Admit INR high at 3.87 - held and reversed with Vit K 2.5 mg IV x 1 on 9/19. S/p paracentesis 9/20. Then on heparin bridge back to Coumadin. Now close to discharge so will bridge with Lovenox. INR today is up to 2.03. Hgb 9.7, plts 126.   Goal of Therapy:  INR goal 2.5-3.5 Monitor platelets by anticoagulation protocol: Yes   Plan:  Continue Lovenox 120mg  [place] Q24h Coumadin 1mg  po tonight  Monitor daily INR, CBC, s/s of bleed  Elenor Quinones, PharmD, BCPS Clinical Pharmacist Phone number [phone] 12/08/2017 8:10 AM

## 2017-12-08 NOTE — Progress Notes (Signed)
Patient discharged to home. Patient AVS reviewed and signed. Patient capable re-verbalizing medications and follow-up appointments. IV removed. Patient belongings sent with patient. Patient educated to return to the ED in the event of SOB, chest pain or dizziness.   Shaquel Chavous B. RN 

## 2017-12-10 ENCOUNTER — Ambulatory Visit (INDEPENDENT_AMBULATORY_CARE_PROVIDER_SITE_OTHER): Payer: Medicare Other | Admitting: Pharmacist

## 2017-12-10 DIAGNOSIS — Z5181 Encounter for therapeutic drug level monitoring: Secondary | ICD-10-CM

## 2017-12-10 DIAGNOSIS — Z952 Presence of prosthetic heart valve: Secondary | ICD-10-CM

## 2017-12-10 LAB — POCT INR: INR: 1.7 — AB (ref 2.0–3.0)

## 2017-12-10 NOTE — Patient Instructions (Signed)
Description   Take 1.5 tablets today, tomorrow, and Sunday, then recheck INR on Monday. Continue Lovenox 120mg  injections once a day. Call when procedure is scheduled [phone]

## 2017-12-13 ENCOUNTER — Ambulatory Visit (INDEPENDENT_AMBULATORY_CARE_PROVIDER_SITE_OTHER): Payer: Medicare Other | Admitting: Pharmacist

## 2017-12-13 DIAGNOSIS — Z952 Presence of prosthetic heart valve: Secondary | ICD-10-CM

## 2017-12-13 DIAGNOSIS — Z5181 Encounter for therapeutic drug level monitoring: Secondary | ICD-10-CM | POA: Diagnosis not present

## 2017-12-13 LAB — POCT INR: INR: 2.1 (ref 2.0–3.0)

## 2017-12-13 NOTE — Patient Instructions (Signed)
Description   Take 1.5 tablets today and tomorrow, then resume taking 1 tablet daily except 1/2 tablet on Mondays and Fridays. Continue Lovenox 120mg  injections once a day. Recheck INR on Friday. Call when procedure is scheduled [phone]

## 2017-12-14 ENCOUNTER — Encounter: Payer: Self-pay | Admitting: Hematology and Oncology

## 2017-12-17 ENCOUNTER — Ambulatory Visit (INDEPENDENT_AMBULATORY_CARE_PROVIDER_SITE_OTHER): Payer: Medicare Other | Admitting: Family Medicine

## 2017-12-17 ENCOUNTER — Ambulatory Visit (INDEPENDENT_AMBULATORY_CARE_PROVIDER_SITE_OTHER): Payer: Medicare Other

## 2017-12-17 ENCOUNTER — Encounter: Payer: Self-pay | Admitting: Family Medicine

## 2017-12-17 VITALS — BP 101/80 | HR 99 | Temp 97.5°F | Wt 187.8 lb

## 2017-12-17 DIAGNOSIS — K3189 Other diseases of stomach and duodenum: Secondary | ICD-10-CM

## 2017-12-17 DIAGNOSIS — N179 Acute kidney failure, unspecified: Secondary | ICD-10-CM

## 2017-12-17 DIAGNOSIS — Z7901 Long term (current) use of anticoagulants: Secondary | ICD-10-CM | POA: Diagnosis not present

## 2017-12-17 DIAGNOSIS — E43 Unspecified severe protein-calorie malnutrition: Secondary | ICD-10-CM

## 2017-12-17 DIAGNOSIS — M11261 Other chondrocalcinosis, right knee: Secondary | ICD-10-CM | POA: Diagnosis not present

## 2017-12-17 DIAGNOSIS — C7A8 Other malignant neuroendocrine tumors: Secondary | ICD-10-CM | POA: Diagnosis not present

## 2017-12-17 DIAGNOSIS — E871 Hypo-osmolality and hyponatremia: Secondary | ICD-10-CM | POA: Diagnosis not present

## 2017-12-17 DIAGNOSIS — I10 Essential (primary) hypertension: Secondary | ICD-10-CM | POA: Diagnosis not present

## 2017-12-17 DIAGNOSIS — I898 Other specified noninfective disorders of lymphatic vessels and lymph nodes: Secondary | ICD-10-CM | POA: Diagnosis not present

## 2017-12-17 DIAGNOSIS — Z5181 Encounter for therapeutic drug level monitoring: Secondary | ICD-10-CM | POA: Diagnosis not present

## 2017-12-17 DIAGNOSIS — R601 Generalized edema: Secondary | ICD-10-CM

## 2017-12-17 DIAGNOSIS — Z952 Presence of prosthetic heart valve: Secondary | ICD-10-CM | POA: Diagnosis not present

## 2017-12-17 DIAGNOSIS — K31A0 Gastric intestinal metaplasia, unspecified: Secondary | ICD-10-CM | POA: Insufficient documentation

## 2017-12-17 DIAGNOSIS — D61818 Other pancytopenia: Secondary | ICD-10-CM

## 2017-12-17 DIAGNOSIS — K2970 Gastritis, unspecified, without bleeding: Secondary | ICD-10-CM

## 2017-12-17 DIAGNOSIS — I890 Lymphedema, not elsewhere classified: Secondary | ICD-10-CM

## 2017-12-17 DIAGNOSIS — K439 Ventral hernia without obstruction or gangrene: Secondary | ICD-10-CM

## 2017-12-17 LAB — POCT INR: INR: 2.6 (ref 2.0–3.0)

## 2017-12-17 MED ORDER — PREDNISONE 20 MG PO TABS: 20.0000 mg | ORAL_TABLET | Freq: Every day | ORAL | 0 refills | Status: AC

## 2017-12-17 MED ORDER — FUROSEMIDE 40 MG PO TABS: 40.0000 mg | ORAL_TABLET | ORAL | 2 refills | Status: DC

## 2017-12-17 NOTE — Patient Instructions (Signed)
Dear Mario Powell,   It was nice to see you today! I am glad you came in for your concerns. This document serves as a "wrap-up" to all that we discussed today and is listed as follows:   .DIAGORDERS   Swollen Knee  Your knee has been swollen for 3 days, we are going to treat it with prednisone.  Please take this medication every morning for 5 days.  You do not need to take the Colcrys.  The Colcrys can damage your kidneys, so we do not want to use this.  If you get fevers or chills or the swelling gets worse, please call and be seen immediately.  Biopsy results  I have given you the paper that describes the biopsy results from your admission.  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  He will be following up with GI, Dr. [name], later this month.  We will continue to follow all of your other doctors appointments to stay in the loop with all of your health care.  I have referred to you to an oncologist for the neuroendocrine tumor.  You are going to see a hematologist later this month, which is for your pancytopenia.  Often, these doctors are known as hematologist-oncologist.  It can be very confusing.  So the appointment that you have with hematology on October 10 at 38 AM is going to be for your pancytopenia.  If either of your daughters would like to speak to me, please feel free to message me on my chart and I will be happy to return her calls as soon as possible.  Leg swelling  I am going to keep you on the Lasix, as it appears to be helping with the leg swelling and keeping your blood pressure down.  You were also interested in the [facility] lymphedema clinic.  We will send you more information about this.  Labs  We are checking some labs today to follow-up from the hospital admission.  We will call you with any results or if any changes need to be made in your medications.   Labs & Orders For labs done today, I will reach out to you with results  via letter or phone call.  Follow up Please follow up with me in 1 month.  Remember to stop at the front desk on your way out.  Please request a follow up appointment if you experience any worsening symptoms. If your symptoms are life threatening, please go immediately to the nearest Emergency Department.    Thank you for choosing [facility] for your primary care needs and stay well!   Best,   Dr. Zettie Cooley Resident Physician, PGY-1 [facility] [phone]    Don't forget to sign up for MyChart for instant access to your health profile, labs, orders, upcoming appointments or to contact your provider with questions. Stop at the front desk on the way out for more information about how to sign up!

## 2017-12-17 NOTE — Patient Instructions (Signed)
Description   Continue on same dosage 1 tablet daily except 1/2 tablet on Mondays and Fridays. Stop the Lovenox injections.  Recheck INR in 1 week. Call when procedure is scheduled [phone]

## 2017-12-17 NOTE — Progress Notes (Signed)
SUBJECTIVE:  PCP: Wilber Oliphant, MD Patient ID: [id number]  Date of birth: [birth year]  HPI Mario Powell is a 72 y.o. male who presents for hospital follow-up after being admitted for diffuse anasarca. Issues for follow up per discharge summary. Other complaints today include swollen and tender right knee.   Issues for Follow Up:   1. Symptom improvement of anasarca. -- improved 2. Biopsy results from EGD and colonoscopy -- addressed 3. Refer for follow-up with hematology for pancytopenia -- appointment made 4. Follow-up BMP and CBC to check for creatinine and CBC. -- ordered 5. Restart ASA, HCTZ, ramipril if kidney function returns to baseline -- pending  6. Per GI, continue PPI BID x 4 weeks (October 24th) and then QD x 4 weeks. (October 25- Novemeber 25) 7. Nutrition status, diet follow up for chylous fluid -- Patient drinking supplements. Low fat diet. 8. Follow up at [place] clinic -- addressed  9. Schedule MRI 6 to 8 weeks after discharge.  Abdominal x-ray to confirm clips have passed.  #Well-differentiated neuroendocrine tumor Patient's biopsy results returned with hyperplastic polyp with intestinal metaplasia, well-differentiated neuroendocrine tumor, grade 1 with low mitotic rate of less than 2% and KI-67 around 2%.  The other stomach biopsy for chronic gastritis with intestinal metaplasia, however no malignancy and H. pylori negative.  The remaining biopsies from colon and rectum show no dysplasia or malignancy.  In the meantime, patient will continue PPI twice daily x4 weeks until October 24.  Then once daily x4 weeks  #Chylous ascites Patient reports he does not feel excess fluid in his abdomen. He denies any recent weight gain. He has been weighing himself daily. After discharge, he weight 180, with slow loss of weight down to 175, which has slowly increased back to 179.2 over the last several days.Patient reports taking nutritional supplements and remaining on a low  fat diet in an attempt to decrease chyle formation.    #Bilateral lower extremity edema Patient reports that he has continued to take the Lasix 40 mg every other day as prescribed after discharge.  He reports that his legs remained on the thin side for the first couple days after discharge and after returning to work they and sitting at a desk all day, they increased in size.  He reports that he just received a leg lifter for his desk at work.   #Hypertension Patient's blood pressure in clinic today is 101/80. He did not restart in ASA, HCTZ, ramipril after discharge due to lasix and poor kidney function. He does not check his blood pressure at home. He denies any chest pain. Palpitations, SOB.  #Lower abdominal Hernia Patient reports that after eating, he feels a bulge from under his umbilicus. It can be pushed back in. Patient denies abdominal pain, melena.   #Swollen knee Patient reports that right knee has been swollen and painful to palpation for the last three days. He has previously used colchicine in the past during gout flares, which typically occur in his knees.  [facility]- [facility] tabs- he reports he is down to 20 now. He reports that he is unable to extend his leg fully. He denies fevers and chills, no recent trauma.   Review of Symptoms: See HPI  HISTORY Medications & Allergies: Reviewed with patient and updated in EMR as appropriate.   Past Medical & Surgical History: Reviewed with patient and updated in EMR as appropriate. Patient Active Problem List  Diagnosis Date Noted  . 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 Hernia of anterior abdominal wall 12/18/2017  . Pseudogout of knee, right 12/18/2017  . 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 Protein-calorie malnutrition, severe 12/08/2017  . Lymphedema   . Pancytopenia ([place])   . Chylous ascites   . AKI (acute kidney injury) ([place])   . Hyponatremia   .  Anticoagulated   . Anasarca 12/01/2017  . Renal insufficiency   . Encounter for therapeutic drug monitoring 04/16/2017  . Essential hypertension 07/12/2013  . Prostate cancer ([place]) 07/12/2013  . Chronic anticoagulation 07/12/2013  . Paroxysmal atrial fibrillation ([place]) 07/12/2013  . History of mitral valve replacement with mechanical valve 01/11/2013   Past Surgical History:  Procedure Laterality Date  . BIOPSY  12/06/2017   Procedure: BIOPSY;  Surgeon: Otis Brace, MD;  Location: [place];  Service: Gastroenterology;;  . COLONOSCOPY WITH PROPOFOL N/A 12/06/2017   Procedure: COLONOSCOPY WITH PROPOFOL ;  Surgeon: Otis Brace, MD;  Location: [place];  Service: Gastroenterology;  Laterality: N/A;  . CORONARY ARTERY BYPASS GRAFT     2001  . ESOPHAGOGASTRODUODENOSCOPY (EGD) WITH PROPOFOL N/A 12/06/2017   Procedure: ESOPHAGOGASTRODUODENOSCOPY (EGD) WITH PROPOFOL;  Surgeon: Otis Brace, MD;  Location: [place];  Service: Gastroenterology;  Laterality: N/A;  . IR PARACENTESIS  12/03/2017  . MITRAL VALVE REPLACEMENT  2001   Mechanical prosthesis  . POLYPECTOMY  12/06/2017   Procedure: POLYPECTOMY;  Surgeon: Otis Brace, MD;  Location: [facility];  Service: Gastroenterology;;    Family History:  family history includes Healthy in his mother, sister, and sister; Heart attack in his father.  Social History:   reports that he has quit smoking. He has never used smokeless tobacco. He reports that he drank alcohol. He reports that he does not use drugs.  OBJECTIVE:  There were no vitals taken for this visit.  Physical Exam:  Gen: NAD, alert, non-toxic, well-appearing, sitting comfortably. Patient walks with cane. Skin: Warm and dry. Right lateral nose with bandage, two small lesions One with dried blood, no active bleeding from either site. Diffuse, scaly erythematous skin circumferentially at lower extremities over areas of edema.  HEENT: NCAT No  conjunctival pallor or injection. No scleral icterus or injection.  MMM.  CV: Irregular rhythm.  Mitral valve click prominent.  RP 2+ bilaterally. BLEE with with 3+ pitting to knees. Resp: CTAB.  No wheezing, rales, abnormal lung sounds.  No increased WOB Abd: NTTP, + BS. Reducible hernia under umbilicus. Air fluid level appreciated. Positive fluid wave. Remains soft with mild increase in fluid compared to discharge exam.  Psych: Cooperative with exam. Pleasant. Makes eye contact. Speech normal. Extremities: Moves all extremities spontaneously. Right knee with suprapatellar swelling. Mildly warm to touch. Painful to palpation. Pt unable to extend right leg completely.  Neuro: CN II-XII grossly intact. No FNDs.   Pertinent Labs & Imaging:   Reviewed in chart   ASSESSMENT & PLAN:  Chylous ascites On exam today, patient's abdomen is positive for fluid wave and percussion consistent with fluid. It is not as distended as it was in the hospital.   Patient will continue to monitor weight at home   Will continue to monitor on monthly exam.   Address at GI appointment if increased ascites.   Neuroendocrine cancer [facility]) Patient given information today and he took the information well. He was not tearful. Communicated future plans with patient. He expressed understanding and looks forward to meeting with GI for next steps.GI will  likely re-scope to check for margins and look for other lesions.   Ambulatory referral to oncology for tumor   Follow up with GI  Pancytopenia ([place])  Repeat CBC w/ diff today.   AKI (acute kidney injury) ([place])  F/u BMP today   ASA, HCTZ, ramipril held on admission and at discharge.   Will not resume today, Bp is okay on exam.   Will continue to monitor   Patient will continue lasix every other day.   May need follow up with nephrology if numbers remain elevated.   Gastritis with intestinal metaplasia of stomach  Per GI, continue PPI BID x 4 weeks  (October 24th) and then QD x 4 weeks. (October 25- Novemeber 25)   Essential hypertension Held HCTZ and ramipril during admission. Discharged without.   Pending BMP   Will continue to monitor BMP, may continue lasix every other day and d/c HCTZ ramipril if BP allow  Hernia of anterior abdominal wall No signs of incarceration.   Continue to monitor as patient would not likely be surgical candidate   Patient given return precautions   Pseudogout of knee, right Likely pseudogout vs gout due to location and presentation. Not erythematous or very warm to touch. Did not aspirate fluid.  Prednisone 89m x 5 days   Avoid colchicine due to renal function    Health Maintenance Due  Topic Date Due  . TETANUS/TDAP  12/15/1962  . PNA vac Low Risk Adult (1 of 2 - PCV13) 12/14/2008  . INFLUENZA VACCINE  10/14/2017    Future Appointments  Date Time Provider [place] 12/23/2017 11:00 AM [facility]  12/24/2017  1:00 PM [facility]  01/27/2018  1:45 PM [facility]Wilber Oliphant [facility]   RZettie Cooley M.D. [facility] PGY -1 12/17/2017, 11:46 AM

## 2017-12-18 ENCOUNTER — Encounter: Payer: Self-pay | Admitting: Family Medicine

## 2017-12-18 DIAGNOSIS — K439 Ventral hernia without obstruction or gangrene: Secondary | ICD-10-CM | POA: Insufficient documentation

## 2017-12-18 DIAGNOSIS — C7A8 Other malignant neuroendocrine tumors: Secondary | ICD-10-CM | POA: Insufficient documentation

## 2017-12-18 DIAGNOSIS — M11261 Other chondrocalcinosis, right knee: Secondary | ICD-10-CM | POA: Insufficient documentation

## 2017-12-18 LAB — CBC WITH DIFFERENTIAL/PLATELET
BASOS ABS: 0 10*3/uL (ref 0.0–0.2)
BASOS: 1 %
EOS (ABSOLUTE): 0.1 10*3/uL (ref 0.0–0.4)
Eos: 1 %
Hematocrit: 30.3 % — ABNORMAL LOW (ref 37.5–51.0)
Hemoglobin: 10.2 g/dL — ABNORMAL LOW (ref 13.0–17.7)
IMMATURE GRANS (ABS): 0 10*3/uL (ref 0.0–0.1)
IMMATURE GRANULOCYTES: 1 %
Lymphocytes Absolute: 0.2 10*3/uL — ABNORMAL LOW (ref 0.7–3.1)
Lymphs: 5 %
MCH: 34.6 pg — ABNORMAL HIGH (ref 26.6–33.0)
MCHC: 33.7 g/dL (ref 31.5–35.7)
MCV: 103 fL — AB (ref 79–97)
MONOS ABS: 0.6 10*3/uL (ref 0.1–0.9)
Monocytes: 15 %
NEUTROS PCT: 77 %
Neutrophils Absolute: 2.9 10*3/uL (ref 1.4–7.0)
PLATELETS: 217 10*3/uL (ref 150–450)
RBC: 2.95 x10E6/uL — AB (ref 4.14–5.80)
RDW: 13.3 % (ref 12.3–15.4)
WBC: 3.7 10*3/uL (ref 3.4–10.8)

## 2017-12-18 LAB — BASIC METABOLIC PANEL
BUN/Creatinine Ratio: 20 (ref 10–24)
BUN: 35 mg/dL — ABNORMAL HIGH (ref 8–27)
CALCIUM: 8.3 mg/dL — AB (ref 8.6–10.2)
CO2: 25 mmol/L (ref 20–29)
Chloride: 91 mmol/L — ABNORMAL LOW (ref 96–106)
Creatinine, Ser: 1.78 mg/dL — ABNORMAL HIGH (ref 0.76–1.27)
GFR calc Af Amer: 42 mL/min/{1.73_m2} — ABNORMAL LOW (ref >59)
GFR, EST NON AFRICAN AMERICAN: 37 mL/min/{1.73_m2} — AB (ref >59)
GLUCOSE: 158 mg/dL — AB (ref 65–99)
POTASSIUM: 3.6 mmol/L (ref 3.5–5.2)
SODIUM: 131 mmol/L — AB (ref 134–144)

## 2017-12-18 NOTE — Assessment & Plan Note (Addendum)
   Per GI, continue PPI BID x 4 weeks (October 24th) and then QD x 4 weeks. (October 25- Novemeber 25)

## 2017-12-18 NOTE — Assessment & Plan Note (Signed)
   Repeat CBC w/ diff today.

## 2017-12-18 NOTE — Assessment & Plan Note (Addendum)
Likely pseudogout vs gout due to location and presentation. Not erythematous or very warm to touch. Did not aspirate fluid.  Prednisone 20mg  x 5 days   Avoid colchicine due to renal function

## 2017-12-18 NOTE — Assessment & Plan Note (Addendum)
Held HCTZ and ramipril during admission. Discharged without.   Pending BMP   Will continue to monitor BMP, may continue lasix every other day and d/c HCTZ ramipril if BP allow

## 2017-12-18 NOTE — Assessment & Plan Note (Addendum)
   F/u BMP today   ASA, HCTZ, ramipril held on admission and at discharge.   Will not resume today, Bp is okay on exam.   Will continue to monitor   Patient will continue lasix every other day.   May need follow up with nephrology if numbers remain elevated.

## 2017-12-18 NOTE — Assessment & Plan Note (Signed)
Patient given information today and he took the information well. He was not tearful. Communicated future plans with patient. He expressed understanding and looks forward to meeting with GI for next steps.GI will likely re-scope to check for margins and look for other lesions.   Ambulatory referral to oncology for tumor   Follow up with GI

## 2017-12-18 NOTE — Assessment & Plan Note (Signed)
No signs of incarceration.   Continue to monitor as patient would not likely be surgical candidate   Patient given return precautions

## 2017-12-18 NOTE — Assessment & Plan Note (Signed)
On exam today, patient's abdomen is positive for fluid wave and percussion consistent with fluid. It is not as distended as it was in the hospital.   Patient will continue to monitor weight at home   Will continue to monitor on monthly exam.   Address at GI appointment if increased ascites.

## 2017-12-20 ENCOUNTER — Telehealth: Payer: Self-pay | Admitting: Hematology

## 2017-12-20 ENCOUNTER — Telehealth: Payer: Self-pay | Admitting: Family Medicine

## 2017-12-20 NOTE — Progress Notes (Signed)
Hemoglobin remains pancytopenic, stable.  * f/u on hematologist recommendations.   Patient has macrocytic anemia.  *address at future encounter  Prediabetes. Hgb A1C during admission was 5.8 w/ high glucose. Will continue to evaluate in clinic.  *patient will need education, but not top priority as of now.   Kidney Function. Creatine improved 1.78 from 1.96 (despite being on lasix every other day for edema). Slight improvement in GFR. Patient has never been seen by nephrologist. Appears that patient did not have AKI on admission, but has progressed into chronic kidney failure. Other nephrotoxic agents have been discontinued.  *patient will need f/u with nephrology

## 2017-12-20 NOTE — Telephone Encounter (Signed)
Pt has been rescheduled to see Dr. Burr Medico on 10/10 at 230pm. Pt aware to arrive 30 minutes early.

## 2017-12-20 NOTE — Addendum Note (Signed)
Addended by: Zettie Cooley E on: 12/20/2017 04:10 PM   Modules accepted: Orders

## 2017-12-20 NOTE — Telephone Encounter (Signed)
Updated patient on recent labs, they have remained stable. Would like for him to see [facility] for his Cr and GFR. Pt is agreeable and will wait for Jackie's phone call. Will continue lasix every other day. Pt to f/u with me in November.   Zettie Cooley, M.D. 12/20/2017, 4:29 PM PGY-1, [place]

## 2017-12-22 NOTE — Progress Notes (Signed)
[place]  [phone] [phone]  Clinic New Consult Note   Patient Care Team: Wilber Oliphant, MD as PCP - General (Family Medicine) 12/23/2017  Referring Physician: Dr. Maudie Mercury  CHIEF COMPLAINTS/PURPOSE OF CONSULTATION:  Newly diagnosed gastric neuroendocrine tumor, pancytopenia    HISTORY OF PRESENTING ILLNESS:  Mario Powell 72 y.o. male is here because of newly diagnosed neuroendocrine tumor. He initially presented to the ER on 12/01/2017 complaining of abdominal swelling, weight gain, and peripheral and scrotal edema of a month's duration. He underwent EGD on 12/06/2017 that revealed gastric polyps, and pathology confirmed a gastric neuroendocrine tumor. He was later discharged on 12/08/2017. Today, he is here alone at the clinic. He is hard of hearing, but despite wearing a hearing aid, he finds it difficult to hear. He denies diarrhea and constipation. He had a mitral valve replacement and is on warfarin. He states that his lower limbs have been swollen for years now. He used lasix for 20 years but didn't help alleviate the swelling.  He sees cardiologist Dr. Tamala Julian and recently started seeing Dr. Maudie Mercury as his PCP.   His wife passed away and has 3 children. He used to work for [employer] and now works in Engineer, technical sales.  He is hard of hearing, wears hearing aids.   MEDICAL HISTORY:  Past Medical History:  Diagnosis Date  . Anasarca 12/01/2017  . Atrial fibrillation ([place]) 07/12/2013   Perioperative, 2001   . CAD (coronary artery disease) of artery bypass graft 07/12/2013   Saphenous vein graft 2001   . Chronic anticoagulation 07/12/2013  . Essential hypertension 07/12/2013  . History of mitral valve replacement with mechanical valve 01/11/2013   Mechanical mitral valve 2001  Bacterial endocarditis as the cause   . HOH (hard of hearing)   . Prostate cancer ([place]) 07/12/2013    SURGICAL HISTORY: Past Surgical History:  Procedure Laterality Date  . BIOPSY   12/06/2017   Procedure: BIOPSY;  Surgeon: Otis Brace, MD;  Location: [place];  Service: Gastroenterology;;  . COLONOSCOPY WITH PROPOFOL N/A 12/06/2017   Procedure: COLONOSCOPY WITH PROPOFOL ;  Surgeon: Otis Brace, MD;  Location: [place];  Service: Gastroenterology;  Laterality: N/A;  . CORONARY ARTERY BYPASS GRAFT     2001  . ESOPHAGOGASTRODUODENOSCOPY (EGD) WITH PROPOFOL N/A 12/06/2017   Procedure: ESOPHAGOGASTRODUODENOSCOPY (EGD) WITH PROPOFOL;  Surgeon: Otis Brace, MD;  Location: [place];  Service: Gastroenterology;  Laterality: N/A;  . IR PARACENTESIS  12/03/2017  . MITRAL VALVE REPLACEMENT  2001   Mechanical prosthesis  . POLYPECTOMY  12/06/2017   Procedure: POLYPECTOMY;  Surgeon: Otis Brace, MD;  Location: [facility];  Service: Gastroenterology;;    SOCIAL HISTORY: Social History   Socioeconomic History  . Marital status: Widowed    Spouse name: Not on file  . Number of children: Not on file  . Years of education: Not on file  . Highest education level: Not on file  Occupational History  . Not on file  Social Needs  . Financial resource strain: Not on file  . Food insecurity:    Worry: Not on file    Inability: Not on file  . Transportation needs:    Medical: Not on file    Non-medical: Not on file  Tobacco Use  . Smoking status: Former Research scientist (life sciences)  . Smokeless tobacco: Never Used  . Tobacco comment: QUIT IN 09/1982  Substance and Sexual Activity  . Alcohol use: Not Currently    Comment: NO ALCOHOL SINCE  CHRISTMAS 2017  . Drug use: Never  . Sexual activity: Not on file  Lifestyle  . Physical activity:    Days per week: Not on file    Minutes per session: Not on file  . Stress: Not on file  Relationships  . Social connections:    Talks on phone: Not on file    Gets together: Not on file    Attends religious service: Not on file    Active member of club or organization: Not on file    Attends meetings of clubs or  organizations: Not on file    Relationship status: Not on file  . Intimate partner violence:    Fear of current or ex partner: Not on file    Emotionally abused: Not on file    Physically abused: Not on file    Forced sexual activity: Not on file  Other Topics Concern  . Not on file  Social History Narrative   Daughters Judson Roch and Sharyn Lull   Works for [employer], mainly at desk all day.    Performs all ADLs    FAMILY HISTORY: Family History  Problem Relation Age of Onset  . Heart attack Father   . Healthy Mother   . Healthy Sister   . Healthy Sister     ALLERGIES:  is allergic to penicillins.  MEDICATIONS:  Current Outpatient Medications  Medication Sig Dispense Refill  . Amino Acids-Protein Hydrolys (FEEDING SUPPLEMENT, PRO-STAT SUGAR FREE 64,) LIQD Take 30 mLs by mouth 3 (three) times daily with meals. 900 mL 0  . ezetimibe (ZETIA) 10 MG tablet Take 1 tablet (10 mg total) by mouth daily. 90 tablet 3  . furosemide (LASIX) 40 MG tablet Take 1 tablet (40 mg total) by mouth every other day. 10 tablet 2  . medium chain triglycerides (MCT OIL) oil Take 15 mLs by mouth 3 (three) times daily with meals. 946 mL 12  . Multiple Vitamin (MULTIVITAMIN WITH MINERALS) TABS tablet Take 1 tablet by mouth daily. 90 tablet 3  . niacin (NIASPAN) 1000 MG CR tablet Take 1 tablet (1,000 mg total) by mouth at bedtime. 90 tablet 3  . pantoprazole (PROTONIX) 40 MG tablet Take 1 tablet (40 mg total) by mouth 2 (two) times daily. 58 tablet 0  . rosuvastatin (CRESTOR) 40 MG tablet Take 1 tablet (40 mg total) by mouth daily. 90 tablet 3  . warfarin (COUMADIN) 1 MG tablet TAKE AS DIRECTED BY ANTICOAGULATION CLINIC (Patient taking differently: Take 1 mg by mouth See admin instructions. Take 1 tablet (1MG) by mouth daily except on Mon and Fridays take 1/2 tablet (0.5MG).) 110 tablet 1   No current facility-administered medications for this visit.     REVIEW OF SYSTEMS:  Constitutional: Denies fevers, chills or  abnormal night sweats Eyes: Denies blurriness of vision, double vision or watery eyes Ears, nose, mouth, throat, and face: Denies mucositis or sore throat Respiratory: Denies cough, dyspnea or wheezes Cardiovascular: Denies palpitation, chest discomfort (+) lower extremity swelling (+) mitral valve replacement  Gastrointestinal:  Denies nausea, heartburn or change in bowel habits Skin: Denies abnormal skin rashes Lymphatics: Denies new lymphadenopathy or easy bruising Neurological:Denies numbness, tingling or new weaknesses Behavioral/Psych: Mood is stable, no new changes  All other systems were reviewed with the patient and are negative.  PHYSICAL EXAMINATION:  ECOG PERFORMANCE STATUS: 1 - Symptomatic but completely ambulatory  Vitals:   12/23/17 1426  BP: (!) 143/82  Pulse: 93  Resp: 17  Temp: 97.7 F (36.5 C)  SpO2: 100%   Filed Weights   12/23/17 1426  Weight: 194 lb 1.6 oz (88 kg)    GENERAL:alert, no distress and comfortable (+) hearing loss SKIN: skin color, texture, turgor are normal, no rashes or significant lesions (+) LL skin hyperpigmentation from chronic swelling EYES: normal, conjunctiva are pink and non-injected, sclera clear OROPHARYNX:no exudate, no erythema and lips, buccal mucosa, and tongue normal  NECK: supple, thyroid normal size, non-tender, without nodularity LYMPH:  no palpable lymphadenopathy in the cervical, axillary or inguinal LUNGS: clear to auscultation and percussion with normal breathing effort HEART: regular rate & rhythm and no murmurs and (+) bilateral lower extremity edema, chronic ABDOMEN:abdomen soft, non-tender and normal bowel sounds (+) moderate ascitics  Musculoskeletal:no cyanosis of digits and no clubbing, (+) moderate to severe bilateral pitting edema, up to knees, diffuse skin hyperpigmentation in the left lower extremity. PSYCH: alert & oriented x 3 with fluent speech NEURO: no focal motor/sensory deficits  LABORATORY DATA:  I  have reviewed the data as listed CBC Latest Ref Rng & Units 12/23/2017 12/17/2017 12/08/2017  WBC 4.0 - 10.5 K/uL 3.7(L) 3.7 2.0(L)  Hemoglobin 13.0 - 17.0 g/dL 10.3(L) 10.2(L) 9.7(L)  Hematocrit 39.0 - 52.0 % 30.1(L) 30.3(L) 28.8(L)  Platelets 150 - 400 K/uL 262 217 126(L)   CMP Latest Ref Rng & Units 12/17/2017 12/08/2017 12/07/2017  Glucose 65 - 99 mg/dL 158(H) 105(H) 102(H)  BUN 8 - 27 mg/dL 35(H) 18 18  Creatinine 0.76 - 1.27 mg/dL 1.78(H) 1.96(H) 1.84(H)  Sodium 134 - 144 mmol/L 131(L) 133(L) 133(L)  Potassium 3.5 - 5.2 mmol/L 3.6 3.8 3.6  Chloride 96 - 106 mmol/L 91(L) 98 100  CO2 20 - 29 mmol/L '25 29 25  ' Calcium 8.6 - 10.2 mg/dL 8.3(L) 8.4(L) 8.4(L)  Total Protein 6.5 - 8.1 g/dL - - 4.5(L)  Total Bilirubin 0.3 - 1.2 mg/dL - - 0.8  Alkaline Phos 38 - 126 U/L - - 53  AST 15 - 41 U/L - - 30  ALT 0 - 44 U/L - - 23    PROCEDURES  12/06/2017 EGD Findings: The Z-line was regular and was found 39 cm from the incisors. There is no endoscopic evidence of varices in the entire esophagus. Diffuse moderate inflammation with hemorrhage characterized by congestion (edema), erosions, erythema and linear erosions was found in the entire examined stomach. Biopsies were taken with a cold forceps for histology. patient had oozing from biopsy site which stopped spontaneously. Two 10 mm semi-sessile polyps with no bleeding and stigmata of recent bleeding were found on the lesser curvature of the stomach. These polyps were removed with a hot snare. Resection and retrieval were complete. polyps were retrieved using net. The duodenal bulb, first portion of the duodenum and second portion of the duodenum were normal.  Impression: - Z-line regular, 39 cm from the incisors. - Chronic gastritis with hemorrhage. Biopsied. - Two gastric polyps. Resected and retrieved. - Normal duodenal bulb, first portion of the duodenum and second portion of the duodenum.   12/06/2017 Colonoscopy  Findings: The  perianal and digital rectal examinations were normal. A scattered area of mildly erythematous, hemorrhagic, inflamed and petechial mucosa was found in the entire colon. Biopsies were taken with a cold forceps for histology. A 10 mm polyp was found in the rectum. The polyp was semi-pedunculated. The polyp was removed with a hot snare. Resection and retrieval were complete. To prevent bleeding after the polypectomy, one hemostatic clip was successfully placed. Internal hemorrhoids were found during retroflexion.  The hemorrhoids were medium-sized.  Impression: - Erythematous, hemorrhagic, inflamed and petechial mucosa in the entire examined colon. Biopsied. - One 10 mm polyp in the rectum, removed with a hot snare. Resected and retrieved. Clip was placed. - Internal hemorrhoids.  12/03/2017 IR Paracentesis IMPRESSION: Successful ultrasound-guided diagnostic and therapeutic paracentesis yielding 5.0 liters of peritoneal fluid.   PATHOLOGY  12/06/2017 Surgical Pathology Diagnosis 1. Stomach, polyp(s) - WELL DIFFERENTIATED NEUROENDOCRINE TUMOR, GRADE 1. - TUMOR IS <0.1 CM OF THE CAUTERIZED EDGE. - HYPERPLASTIC POLYP WITH INTESTINAL METAPLASIA. 2. Stomach, biopsy, Random - CHRONIC GASTRITIS WITH INTESTINAL METAPLASIA. - HELICOBACTER PYLORI IMMUNOHISTOCHEMISTRY IS NEGATIVE. - NO DYSPLASIA OR MALIGNANCY. 3. Colon, biopsy, Random - BENIGN COLONIC MUCOSA. - NO ACTIVE INFLAMMATION OR EVIDENCE OF MICROSCOPIC COLITIS. - NO DYSPLASIA OR MALIGNANCY. 4. Rectum, polyp(s) - INFLAMED PROLAPSE TYPE POLYP. - NO DYSPLASIA OR MALIGNANCY. Microscopic Comment 1. The neuroendocrine tumor has a low mitotic rate <2% and Ki-67 around 2%.formation Specimen(s) Obtained: 1. Stomach, polyp(s) 2. Stomach, biopsy, Random 3. Colon, biopsy, Random 4. Rectum, polyp(s) Specimen Clinical Information 1. Abnormal CT, gastric polyps s/p snare,gastric erosions (nt) 2. r/o H pylori (nt) 3. r/o inflammation  (nt) Gross 1. Received in formalin are two soft tan-red mucosal polyps which measure 0.7 and 0.8 cm in greatest dimension. The polyps are inked, sectioned and entirely submitted separately in two cassettes. 2. Received in formalin are tan, soft tissue fragments that are submitted in toto. Number: three, Size: 0.2 to 0.3 cm. 3. Received in formalin is a tan, soft tissue fragment that is submitted in toto. Size: 0.6 cm, 1 block submitted. 4. Received in formalin is a 0.7 x 0.5 x 0.3 cm soft tan mucosal polyp. The specimen is inked, sectioned and entirely submitted in one cassette. (GRP:ecj 12/06/2017) Stain(s) used in Diagnosis: The following stain(s) were used in diagnosing the case: H. Pylori IHC, KI-67-NO ACIS. The control(s) stained appropriately.  RADIOGRAPHIC STUDIES: I have personally reviewed the radiological images as listed and agreed with the findings in the report.  12/04/2017 CT Abdomen IMPRESSION: 1. Moderate to large LEFT pleural effusion, moderate ascites and subcutaneous edema. 2. No hepatic abnormality identified on this noncontrast scan. 3.  Aortic Atherosclerosis (ICD10-I70.0).   Ct Abdomen Wo Contrast  Result Date: 12/04/2017 CLINICAL DATA:  72 year old male with abnormal LFTs. EXAM: CT ABDOMEN WITHOUT CONTRAST TECHNIQUE: Multidetector CT imaging of the abdomen was performed following the standard protocol without IV contrast. COMPARISON:  01/18/2007 CT FINDINGS: Please note that parenchymal abnormalities may be missed without intravenous contrast. Lower chest: A moderate to large LEFT pleural effusion is noted. Mitral valve replacement identified. Hepatobiliary: The liver and gallbladder are unremarkable. No definite biliary dilatation. Pancreas: Unremarkable Spleen: Unremarkable Adrenals/Urinary Tract: The kidneys and adrenal glands are unremarkable. Stomach/Bowel: The visualized bowel is unremarkable. No bowel distention noted. Vascular/Lymphatic: Aortic atherosclerosis.  No enlarged abdominal lymph nodes. Other: A moderate amount of ascites is noted within the abdomen. No evidence of pneumoperitoneum. Subcutaneous edema identified. Musculoskeletal: No acute or suspicious bony abnormalities. IMPRESSION: 1. Moderate to large LEFT pleural effusion, moderate ascites and subcutaneous edema. 2. No hepatic abnormality identified on this noncontrast scan. 3.  Aortic Atherosclerosis (ICD10-I70.0). Electronically Signed   By: Margarette Canada M.D.   On: 12/04/2017 16:28   Dg Chest 2 View  Result Date: 12/01/2017 CLINICAL DATA:  Bilateral lower extremity swelling x1 month. SOB when supine. Pt denies chest pain. Hx of HTN, CAD, A-fib, Mitral valve replacement. Ex-smoker, quit in 1986. EXAM: CHEST - 2 VIEW COMPARISON:  None. FINDINGS: Sternotomy wires overlie normal cardiac silhouette. Moderate LEFT effusions present. Small RIGHT effusion. Lungs are clear. No osseous abnormality. IMPRESSION: Bilateral effusions, LEFT greater than RIGHT. No comparison available. Electronically Signed   By: Suzy Bouchard M.D.   On: 12/01/2017 11:24   US Renal  Result Date: 12/01/2017 CLINICAL DATA:  Acute kidney injury EXAM: RENAL / URINARY TRACT ULTRASOUND COMPLETE COMPARISON:  CT 11/22/2006 FINDINGS: Right Kidney: Length: 9.2 cm. Echogenicity within normal limits. No mass or hydronephrosis visualized. Left Kidney: Length: 7.4 cm. Echogenicity within normal limits. No mass or hydronephrosis visualized. Bladder: Appears normal for degree of bladder distention. Moderate to large amount of free fluid in the abdomen. Incidental note made of left pleural effusion. IMPRESSION: 1. Ultrasound appearance of the kidneys is within normal limits 2. Moderate to large amount of ascites in the abdomen. Left pleural effusion. Electronically Signed   By: Donavan Foil M.D.   On: 12/01/2017 19:49   US Abdomen Limited Ruq  Result Date: 12/02/2017 CLINICAL DATA:  Distension with ascites EXAM: ULTRASOUND ABDOMEN LIMITED  RIGHT UPPER QUADRANT COMPARISON:  12/01/2017 FINDINGS: Gallbladder: Small stones measuring up to 3 mm. Normal wall thickness. Negative sonographic Murphy Common bile duct: Diameter: 3.8 mm Liver: Subtle contour nodularity of the liver. No focal hepatic abnormality. Portal vein is patent on color Doppler imaging with normal direction of blood flow towards the liver. Small moderate ascites IMPRESSION: 1. Cholelithiasis without sonographic evidence for cholecystitis or biliary dilatation 2. Slight contour nodularity of the liver suggesting cirrhosis 3. Small to moderate ascites Electronically Signed   By: Donavan Foil M.D.   On: 12/02/2017 19:21   Ir Paracentesis  Result Date: 12/03/2017 INDICATION: Patient with abdominal distention, found to have ascites. Request is made for diagnostic and therapeutic paracentesis. Due to renal insufficiency will limit removal to 5 liters today. EXAM: ULTRASOUND GUIDED DIAGNOSTIC AND THERAPEUTIC PARACENTESIS MEDICATIONS: 10 mL 1% lidocaine COMPLICATIONS: None immediate. PROCEDURE: Informed written consent was obtained from the patient after a discussion of the risks, benefits and alternatives to treatment. A timeout was performed prior to the initiation of the procedure. Initial ultrasound scanning demonstrates a large amount of ascites within the right lower abdominal quadrant. The right lower abdomen was prepped and draped in the usual sterile fashion. 1% lidocaine was used for local anesthesia. Following this, a 6 Fr Safe-T-Centesis catheter was introduced. An ultrasound image was saved for documentation purposes. The paracentesis was performed. The catheter was removed and a dressing was applied. The patient tolerated the procedure well without immediate post procedural complication. FINDINGS: A total of approximately 5.0 liters of chylous-appearing fluid was removed. Samples were sent to the laboratory as requested by the clinical team. IMPRESSION: Successful  ultrasound-guided diagnostic and therapeutic paracentesis yielding 5.0 liters of peritoneal fluid. Read by: Brynda Greathouse PA-C Electronically Signed   By: Aletta Edouard M.D.   On: 12/03/2017 14:59    ASSESSMENT & PLAN:  JARVIS SAWA is a 72 y.o. male with history of HTN, lower abdominal hernia, pseudogout of right knee, mitral valve replacement, CAD, prostate cancer, chronic anticoagulation, atrial fibrillation  1. Small gastric neuroendocrine Tumor -During his hospitalization for anasarca and large ascites, he underwent EGD for anemia, which showed 2 polyps in the stomach, which were removed.  Pathology reviewed low-grade neuroendocrine tumor, margin was negative but very close.  I reviewed pathology results with him. -He had a CT abdomen pelvis without contrast, showed no evidence of enlarged lymph nodes or metastasis. -This is likely a  very small, early stage neuroendocrine tumor which has been removed endoscopy.  I do not think she needs partial gastrostomy, will discuss her case in our GI tumor board.  Due to the close margin, he needs close follow-up.  He will follow-up with GI Dr. [name]  -Given today very low risk of metastasis, he may not need surveillance CT scans. -will f/u clinically  -f/u in 4 months   2. Anemia, transit leukopenia and thrombocytopenia -When he was hospitalized recently, he was found to have pancytopenia.  After hospital discharge, he is repeat a CBC showed normal WBC and platelet count.  He has persistent mild to moderate anemia. -I will obtain anemia work-up, including reticular count, iron study, folic acid, K09, methylmalonic acid level, SPEP with immunofixation, light chain levels, etc.  We will also check LDH and haptoglobin to rule out hemolysis. -If the above work-up is negative, his anemia is likely related to his chronic disease, especially CKD.  -lab today and I will call him with results next week   2. HTN, A-Fib -He had a mitral valve  replacement and is on coumadin.   3. CKD Stage III -Labs from last week showed Cr 1.78 GFR 37 -I suspect his CKD might be a contributing factor to his anemia   PLAN  -f/u and labs in 4 months -Labs today, I will call him with results return. -Send his case in our GI tumor board next week.  Orders Placed This Encounter  Procedures  . CBC with Differential (Cancer Center Only)    Standing Status:   Future    Number of Occurrences:   1    Standing Expiration Date:   12/24/2018  . Lactate dehydrogenase (LDH)    Standing Status:   Future    Number of Occurrences:   1    Standing Expiration Date:   12/23/2018  . Ferritin    Standing Status:   Future    Number of Occurrences:   1    Standing Expiration Date:   12/23/2018  . Erythropoietin    Standing Status:   Future    Number of Occurrences:   1    Standing Expiration Date:   12/23/2018  . Iron and TIBC    Standing Status:   Future    Number of Occurrences:   1    Standing Expiration Date:   12/23/2018  . Folate RBC    Standing Status:   Future    Number of Occurrences:   1    Standing Expiration Date:   12/23/2018  . Vitamin B12    Standing Status:   Future    Number of Occurrences:   1    Standing Expiration Date:   12/23/2018  . Technologist smear review    Standing Status:   Future    Number of Occurrences:   1    Standing Expiration Date:   12/24/2018  . Haptoglobin    Standing Status:   Future    Number of Occurrences:   1    Standing Expiration Date:   12/23/2018  . Methylmalonic acid, serum    Standing Status:   Future    Number of Occurrences:   1    Standing Expiration Date:   12/23/2018  . SPEP with reflex to IFE    Standing Status:   Future    Number of Occurrences:   1    Standing Expiration Date:   12/23/2018  . Kappa/lambda light chains  Standing Status:   Future    Number of Occurrences:   1    Standing Expiration Date:   12/23/2018    All questions were answered. The patient knows to call the clinic  with any problems, questions or concerns. I spent 40 minutes counseling the patient face to face. The total time spent in the appointment was 50 minutes and more than 50% was on counseling.  Dierdre Searles Dweik am acting as scribe for Dr. Truitt Merle.  I have reviewed the above documentation for accuracy and completeness, and I agree with the above.      Truitt Merle, MD 12/23/2017

## 2017-12-23 ENCOUNTER — Inpatient Hospital Stay: Payer: Medicare Other | Attending: Hematology and Oncology | Admitting: Hematology

## 2017-12-23 ENCOUNTER — Encounter: Payer: Medicare Other | Admitting: Hematology and Oncology

## 2017-12-23 ENCOUNTER — Inpatient Hospital Stay: Payer: Medicare Other

## 2017-12-23 ENCOUNTER — Encounter: Payer: Self-pay | Admitting: Hematology

## 2017-12-23 ENCOUNTER — Telehealth: Payer: Self-pay

## 2017-12-23 DIAGNOSIS — Z87891 Personal history of nicotine dependence: Secondary | ICD-10-CM | POA: Diagnosis not present

## 2017-12-23 DIAGNOSIS — I4891 Unspecified atrial fibrillation: Secondary | ICD-10-CM | POA: Diagnosis not present

## 2017-12-23 DIAGNOSIS — N183 Chronic kidney disease, stage 3 (moderate): Secondary | ICD-10-CM

## 2017-12-23 DIAGNOSIS — Z954 Presence of other heart-valve replacement: Secondary | ICD-10-CM | POA: Diagnosis not present

## 2017-12-23 DIAGNOSIS — I10 Essential (primary) hypertension: Secondary | ICD-10-CM

## 2017-12-23 DIAGNOSIS — D3A8 Other benign neuroendocrine tumors: Secondary | ICD-10-CM | POA: Diagnosis present

## 2017-12-23 DIAGNOSIS — Z7901 Long term (current) use of anticoagulants: Secondary | ICD-10-CM | POA: Diagnosis not present

## 2017-12-23 DIAGNOSIS — I129 Hypertensive chronic kidney disease with stage 1 through stage 4 chronic kidney disease, or unspecified chronic kidney disease: Secondary | ICD-10-CM | POA: Diagnosis not present

## 2017-12-23 DIAGNOSIS — D72819 Decreased white blood cell count, unspecified: Secondary | ICD-10-CM

## 2017-12-23 DIAGNOSIS — D696 Thrombocytopenia, unspecified: Secondary | ICD-10-CM

## 2017-12-23 DIAGNOSIS — D649 Anemia, unspecified: Secondary | ICD-10-CM | POA: Diagnosis not present

## 2017-12-23 DIAGNOSIS — H9190 Unspecified hearing loss, unspecified ear: Secondary | ICD-10-CM

## 2017-12-23 LAB — CBC WITH DIFFERENTIAL (CANCER CENTER ONLY)
Abs Immature Granulocytes: 0.01 10*3/uL (ref 0.00–0.07)
BASOS PCT: 1 %
Basophils Absolute: 0 10*3/uL (ref 0.0–0.1)
EOS ABS: 0.2 10*3/uL (ref 0.0–0.5)
EOS PCT: 6 %
HCT: 30.1 % — ABNORMAL LOW (ref 39.0–52.0)
Hemoglobin: 10.3 g/dL — ABNORMAL LOW (ref 13.0–17.0)
Immature Granulocytes: 0 %
Lymphocytes Relative: 5 %
Lymphs Abs: 0.2 10*3/uL — ABNORMAL LOW (ref 0.7–4.0)
MCH: 34.6 pg — AB (ref 26.0–34.0)
MCHC: 34.2 g/dL (ref 30.0–36.0)
MCV: 101 fL — AB (ref 80.0–100.0)
MONO ABS: 0.4 10*3/uL (ref 0.1–1.0)
MONOS PCT: 11 %
Neutro Abs: 2.8 10*3/uL (ref 1.7–7.7)
Neutrophils Relative %: 77 %
PLATELETS: 262 10*3/uL (ref 150–400)
RBC: 2.98 MIL/uL — AB (ref 4.22–5.81)
RDW: 14.6 % (ref 11.5–15.5)
WBC: 3.7 10*3/uL — AB (ref 4.0–10.5)
nRBC: 0 % (ref 0.0–0.2)

## 2017-12-23 LAB — RETIC PANEL
IMMATURE RETIC FRACT: 20.5 % — AB (ref 2.3–15.9)
RBC.: 2.95 MIL/uL — ABNORMAL LOW (ref 4.22–5.81)
RETIC CT PCT: 5.4 % — AB (ref 0.4–3.1)
Retic Count, Absolute: 159.9 10*3/uL (ref 19.0–186.0)
Reticulocyte Hemoglobin: 33.1 pg (ref >27)

## 2017-12-23 LAB — TECHNOLOGIST SMEAR REVIEW

## 2017-12-23 LAB — LACTATE DEHYDROGENASE: LDH: 307 U/L — AB (ref 98–192)

## 2017-12-23 NOTE — Telephone Encounter (Signed)
Printed avs and calender of upcoming appointment. Per 10/10 los 

## 2017-12-24 ENCOUNTER — Ambulatory Visit (INDEPENDENT_AMBULATORY_CARE_PROVIDER_SITE_OTHER): Payer: Medicare Other

## 2017-12-24 DIAGNOSIS — Z5181 Encounter for therapeutic drug level monitoring: Secondary | ICD-10-CM

## 2017-12-24 DIAGNOSIS — Z952 Presence of prosthetic heart valve: Secondary | ICD-10-CM

## 2017-12-24 LAB — VITAMIN B12: Vitamin B-12: 89 pg/mL — ABNORMAL LOW (ref 180–914)

## 2017-12-24 LAB — IRON AND TIBC
IRON: 37 ug/dL — AB (ref 42–163)
SATURATION RATIOS: 10 % — AB (ref 42–163)
TIBC: 354 ug/dL (ref 202–409)
UIBC: 317 ug/dL

## 2017-12-24 LAB — FOLATE RBC
FOLATE, RBC: 1859 ng/mL (ref >498)
Folate, Hemolysate: 559.7 ng/mL
Hematocrit: 30.1 % — ABNORMAL LOW (ref 37.5–51.0)

## 2017-12-24 LAB — ERYTHROPOIETIN: Erythropoietin: 45.2 m[IU]/mL — ABNORMAL HIGH (ref 2.6–18.5)

## 2017-12-24 LAB — HAPTOGLOBIN

## 2017-12-24 LAB — POCT INR: INR: 1.8 — AB (ref 2.0–3.0)

## 2017-12-24 LAB — FERRITIN: Ferritin: 90 ng/mL (ref 24–336)

## 2017-12-24 NOTE — Patient Instructions (Signed)
Description   Start taking 1 tablet daily except 1.5 tablets on Mondays and Fridays.  Recheck INR in 1 week. Call when procedure is scheduled [phone]

## 2017-12-26 LAB — METHYLMALONIC ACID, SERUM: METHYLMALONIC ACID, QUANTITATIVE: 3401 nmol/L — AB (ref 0–378)

## 2017-12-27 LAB — IMMUNOFIXATION REFLEX, SERUM
IGM (IMMUNOGLOBULIN M), SRM: 39 mg/dL (ref 15–143)
IgA: 197 mg/dL (ref 61–437)
IgG (Immunoglobin G), Serum: 750 mg/dL (ref 700–1600)

## 2017-12-27 LAB — PROTEIN ELECTROPHORESIS, SERUM, WITH REFLEX
A/G RATIO SPE: 1.1 (ref 0.7–1.7)
ALBUMIN ELP: 2.6 g/dL — AB (ref 2.9–4.4)
ALPHA-1-GLOBULIN: 0.3 g/dL (ref 0.0–0.4)
Alpha-2-Globulin: 0.4 g/dL (ref 0.4–1.0)
Beta Globulin: 1.1 g/dL (ref 0.7–1.3)
Gamma Globulin: 0.6 g/dL (ref 0.4–1.8)
Globulin, Total: 2.4 g/dL (ref 2.2–3.9)
M-Spike, %: 0.1 g/dL — ABNORMAL HIGH
SPEP INTERP: 0
Total Protein ELP: 5 g/dL — ABNORMAL LOW (ref 6.0–8.5)

## 2017-12-27 LAB — KAPPA/LAMBDA LIGHT CHAINS
KAPPA FREE LGHT CHN: 116.7 mg/L — AB (ref 3.3–19.4)
Kappa, lambda light chain ratio: 2.41 — ABNORMAL HIGH (ref 0.26–1.65)
LAMDA FREE LIGHT CHAINS: 48.5 mg/L — AB (ref 5.7–26.3)

## 2017-12-31 ENCOUNTER — Ambulatory Visit (INDEPENDENT_AMBULATORY_CARE_PROVIDER_SITE_OTHER): Payer: Medicare Other | Admitting: *Deleted

## 2017-12-31 DIAGNOSIS — Z5181 Encounter for therapeutic drug level monitoring: Secondary | ICD-10-CM | POA: Diagnosis not present

## 2017-12-31 DIAGNOSIS — Z952 Presence of prosthetic heart valve: Secondary | ICD-10-CM | POA: Diagnosis not present

## 2017-12-31 LAB — POCT INR: INR: 2 (ref 2.0–3.0)

## 2017-12-31 NOTE — Patient Instructions (Signed)
Description   Today take 2 tablets, then Start taking 1 tablet daily except 1.5 tablets on Mondays, Wednesdays and Fridays.  Recheck INR in 1 week. Call when procedure is scheduled [phone]

## 2018-01-01 ENCOUNTER — Other Ambulatory Visit: Payer: Self-pay | Admitting: Hematology

## 2018-01-01 DIAGNOSIS — D531 Other megaloblastic anemias, not elsewhere classified: Secondary | ICD-10-CM

## 2018-01-03 ENCOUNTER — Telehealth: Payer: Self-pay

## 2018-01-03 ENCOUNTER — Telehealth: Payer: Self-pay | Admitting: Family Medicine

## 2018-01-03 ENCOUNTER — Emergency Department (HOSPITAL_COMMUNITY): Payer: Medicare Other

## 2018-01-03 ENCOUNTER — Other Ambulatory Visit: Payer: Self-pay

## 2018-01-03 ENCOUNTER — Encounter (HOSPITAL_COMMUNITY): Payer: Self-pay | Admitting: Emergency Medicine

## 2018-01-03 ENCOUNTER — Inpatient Hospital Stay (HOSPITAL_COMMUNITY): Payer: Medicare Other

## 2018-01-03 ENCOUNTER — Inpatient Hospital Stay (HOSPITAL_COMMUNITY)
Admission: EM | Admit: 2018-01-03 | Discharge: 2018-01-08 | DRG: 815 | Disposition: A | Discharge status: Home / Self Care | Payer: Medicare Other | Attending: Family Medicine | Admitting: Family Medicine

## 2018-01-03 DIAGNOSIS — Z8546 Personal history of malignant neoplasm of prostate: Secondary | ICD-10-CM

## 2018-01-03 DIAGNOSIS — E538 Deficiency of other specified B group vitamins: Secondary | ICD-10-CM | POA: Diagnosis present

## 2018-01-03 DIAGNOSIS — R188 Other ascites: Secondary | ICD-10-CM

## 2018-01-03 DIAGNOSIS — E785 Hyperlipidemia, unspecified: Secondary | ICD-10-CM | POA: Diagnosis present

## 2018-01-03 DIAGNOSIS — I480 Paroxysmal atrial fibrillation: Secondary | ICD-10-CM

## 2018-01-03 DIAGNOSIS — N183 Chronic kidney disease, stage 3 unspecified: Secondary | ICD-10-CM

## 2018-01-03 DIAGNOSIS — I10 Essential (primary) hypertension: Secondary | ICD-10-CM | POA: Diagnosis not present

## 2018-01-03 DIAGNOSIS — Z87891 Personal history of nicotine dependence: Secondary | ICD-10-CM

## 2018-01-03 DIAGNOSIS — Z8249 Family history of ischemic heart disease and other diseases of the circulatory system: Secondary | ICD-10-CM

## 2018-01-03 DIAGNOSIS — I890 Lymphedema, not elsewhere classified: Secondary | ICD-10-CM | POA: Diagnosis present

## 2018-01-03 DIAGNOSIS — D472 Monoclonal gammopathy: Secondary | ICD-10-CM

## 2018-01-03 DIAGNOSIS — Z923 Personal history of irradiation: Secondary | ICD-10-CM | POA: Diagnosis not present

## 2018-01-03 DIAGNOSIS — R601 Generalized edema: Secondary | ICD-10-CM | POA: Diagnosis not present

## 2018-01-03 DIAGNOSIS — N289 Disorder of kidney and ureter, unspecified: Secondary | ICD-10-CM

## 2018-01-03 DIAGNOSIS — Z952 Presence of prosthetic heart valve: Secondary | ICD-10-CM | POA: Diagnosis not present

## 2018-01-03 DIAGNOSIS — L299 Pruritus, unspecified: Secondary | ICD-10-CM | POA: Diagnosis present

## 2018-01-03 DIAGNOSIS — J90 Pleural effusion, not elsewhere classified: Secondary | ICD-10-CM | POA: Diagnosis present

## 2018-01-03 DIAGNOSIS — E871 Hypo-osmolality and hyponatremia: Secondary | ICD-10-CM | POA: Diagnosis present

## 2018-01-03 DIAGNOSIS — I4891 Unspecified atrial fibrillation: Secondary | ICD-10-CM | POA: Diagnosis not present

## 2018-01-03 DIAGNOSIS — I25810 Atherosclerosis of coronary artery bypass graft(s) without angina pectoris: Secondary | ICD-10-CM | POA: Diagnosis present

## 2018-01-03 DIAGNOSIS — I129 Hypertensive chronic kidney disease with stage 1 through stage 4 chronic kidney disease, or unspecified chronic kidney disease: Secondary | ICD-10-CM | POA: Diagnosis present

## 2018-01-03 DIAGNOSIS — C7A8 Other malignant neuroendocrine tumors: Secondary | ICD-10-CM | POA: Diagnosis not present

## 2018-01-03 DIAGNOSIS — N179 Acute kidney failure, unspecified: Secondary | ICD-10-CM | POA: Diagnosis not present

## 2018-01-03 DIAGNOSIS — I2510 Atherosclerotic heart disease of native coronary artery without angina pectoris: Secondary | ICD-10-CM | POA: Diagnosis not present

## 2018-01-03 DIAGNOSIS — D539 Nutritional anemia, unspecified: Secondary | ICD-10-CM | POA: Diagnosis present

## 2018-01-03 DIAGNOSIS — K219 Gastro-esophageal reflux disease without esophagitis: Secondary | ICD-10-CM | POA: Diagnosis present

## 2018-01-03 DIAGNOSIS — N189 Chronic kidney disease, unspecified: Secondary | ICD-10-CM | POA: Diagnosis not present

## 2018-01-03 DIAGNOSIS — Z7901 Long term (current) use of anticoagulants: Secondary | ICD-10-CM | POA: Diagnosis not present

## 2018-01-03 DIAGNOSIS — I898 Other specified noninfective disorders of lymphatic vessels and lymph nodes: Principal | ICD-10-CM | POA: Diagnosis present

## 2018-01-03 DIAGNOSIS — D61818 Other pancytopenia: Secondary | ICD-10-CM | POA: Diagnosis present

## 2018-01-03 DIAGNOSIS — N3020 Other chronic cystitis without hematuria: Secondary | ICD-10-CM | POA: Diagnosis not present

## 2018-01-03 LAB — BASIC METABOLIC PANEL
Anion gap: 8 (ref 5–15)
BUN: 23 mg/dL (ref 8–23)
CO2: 25 mmol/L (ref 22–32)
CREATININE: 1.89 mg/dL — AB (ref 0.61–1.24)
Calcium: 8.6 mg/dL — ABNORMAL LOW (ref 8.9–10.3)
Chloride: 98 mmol/L (ref 98–111)
GFR calc non Af Amer: 33 mL/min — ABNORMAL LOW (ref >60)
GFR, EST AFRICAN AMERICAN: 39 mL/min — AB (ref >60)
Glucose, Bld: 138 mg/dL — ABNORMAL HIGH (ref 70–99)
Potassium: 4.2 mmol/L (ref 3.5–5.1)
SODIUM: 131 mmol/L — AB (ref 135–145)

## 2018-01-03 LAB — PROTIME-INR
INR: 2.32
Prothrombin Time: 25.1 seconds — ABNORMAL HIGH (ref 11.4–15.2)

## 2018-01-03 LAB — CBC
HCT: 37.9 % — ABNORMAL LOW (ref 39.0–52.0)
Hemoglobin: 12.2 g/dL — ABNORMAL LOW (ref 13.0–17.0)
MCH: 33.1 pg (ref 26.0–34.0)
MCHC: 32.2 g/dL (ref 30.0–36.0)
MCV: 102.7 fL — ABNORMAL HIGH (ref 80.0–100.0)
PLATELETS: 217 10*3/uL (ref 150–400)
RBC: 3.69 MIL/uL — ABNORMAL LOW (ref 4.22–5.81)
RDW: 13.6 % (ref 11.5–15.5)
WBC: 5 10*3/uL (ref 4.0–10.5)
nRBC: 0 % (ref 0.0–0.2)

## 2018-01-03 LAB — BILIRUBIN, FRACTIONATED(TOT/DIR/INDIR)
BILIRUBIN TOTAL: 1 mg/dL (ref 0.3–1.2)
Bilirubin, Direct: 0.2 mg/dL (ref 0.0–0.2)
Indirect Bilirubin: 0.8 mg/dL (ref 0.3–0.9)

## 2018-01-03 LAB — I-STAT TROPONIN, ED: Troponin i, poc: 0.01 ng/mL (ref 0.00–0.08)

## 2018-01-03 LAB — BRAIN NATRIURETIC PEPTIDE: B Natriuretic Peptide: 54.9 pg/mL (ref 0.0–100.0)

## 2018-01-03 MED ORDER — ENOXAPARIN SODIUM 150 MG/ML ~~LOC~~ SOLN
130.0000 mg | SUBCUTANEOUS | Status: DC
  Filled 2018-01-03: qty 0.87

## 2018-01-03 MED ORDER — ONDANSETRON HCL 4 MG/2ML IJ SOLN: 4.0000 mg | Freq: Four times a day (QID) | INTRAMUSCULAR | Status: DC | PRN

## 2018-01-03 MED ORDER — ACETAMINOPHEN 650 MG RE SUPP: 650.0000 mg | Freq: Four times a day (QID) | RECTAL | Status: DC | PRN

## 2018-01-03 MED ORDER — ENOXAPARIN SODIUM 40 MG/0.4ML ~~LOC~~ SOLN: 40.0000 mg | SUBCUTANEOUS | Status: DC

## 2018-01-03 MED ORDER — ONDANSETRON HCL 4 MG PO TABS: 4.0000 mg | ORAL_TABLET | Freq: Four times a day (QID) | ORAL | Status: DC | PRN

## 2018-01-03 MED ORDER — POLYETHYLENE GLYCOL 3350 17 G PO PACK: 17.0000 g | PACK | Freq: Every day | ORAL | Status: DC | PRN

## 2018-01-03 MED ORDER — PANTOPRAZOLE SODIUM 40 MG PO TBEC
40.0000 mg | DELAYED_RELEASE_TABLET | Freq: Two times a day (BID) | ORAL | Status: DC
  Administered 2018-01-03 – 2018-01-08 (×10): 40 mg via ORAL
  Filled 2018-01-03 (×10): qty 1

## 2018-01-03 MED ORDER — WARFARIN SODIUM 2 MG PO TABS
2.0000 mg | ORAL_TABLET | Freq: Once | ORAL | Status: DC
  Filled 2018-01-03: qty 1

## 2018-01-03 MED ORDER — EZETIMIBE 10 MG PO TABS
10.0000 mg | ORAL_TABLET | Freq: Every day | ORAL | Status: DC
  Administered 2018-01-04 – 2018-01-08 (×5): 10 mg via ORAL
  Filled 2018-01-03 (×5): qty 1

## 2018-01-03 MED ORDER — FUROSEMIDE 10 MG/ML IJ SOLN
40.0000 mg | Freq: Once | INTRAMUSCULAR | Status: AC
  Administered 2018-01-03: 40 mg via INTRAVENOUS
  Filled 2018-01-03: qty 4

## 2018-01-03 MED ORDER — ROSUVASTATIN CALCIUM 20 MG PO TABS
40.0000 mg | ORAL_TABLET | Freq: Every day | ORAL | Status: DC
  Administered 2018-01-04 – 2018-01-08 (×5): 40 mg via ORAL
  Filled 2018-01-03 (×6): qty 2

## 2018-01-03 MED ORDER — WARFARIN - PHARMACIST DOSING INPATIENT: Freq: Every day | Status: DC

## 2018-01-03 MED ORDER — ACETAMINOPHEN 325 MG PO TABS: 650.0000 mg | ORAL_TABLET | Freq: Four times a day (QID) | ORAL | Status: DC | PRN

## 2018-01-03 NOTE — Telephone Encounter (Signed)
Spoke with patient per Dr. Burr Medico notified him that his B12 level is low, instructed to start taking B12 1000 mcg daily by mouth.  Also suggested monthly B12 injections.  Patient states he is at [facility] ED and is waiting for a room because the fluid is back in his stomach.

## 2018-01-03 NOTE — ED Notes (Signed)
Pt to ultrasound at this time.

## 2018-01-03 NOTE — Telephone Encounter (Signed)
Patient is having fluid build up in his abdomen again and would like a nurse to call him asap please.  Best number to reach [phone]   He may go to ED today.

## 2018-01-03 NOTE — ED Triage Notes (Signed)
Pt has increased SOB and fluid retention in abd and bilateral legs. Pt has recent hx of paracentesis. Pt has gained 13 lbs since this procedure in September.

## 2018-01-03 NOTE — Telephone Encounter (Signed)
Called pt. No answer, mailbox is full. Will try again this afternoon. Ottis Stain, CMA

## 2018-01-03 NOTE — H&P (Addendum)
[facility] Admission History and Physical Service Pager: [phone]  Patient name: Mario Powell Medical record number: [id number] Date of birth: 01/10/1946 Age: 72 y.o. Gender: male  Primary Care Provider: Wilber Oliphant, MD Consultants: GI Code Status: full  Chief Complaint: swelling and weight gain  Assessment and Plan: Mario Powell is a 72 y.o. male presenting with worsening shortness of breath and diffuse swelling . PMH is significant for recently diagnosed neuroendocrine tumor, MV replacement with mechanical valve, HTN, h/o prostate cancer, paroxysmal afib, CKD, hyponatremia, pancytopenia, lymphedema, abdominal hernia, psuedogout of right knee, macrocytic anemia.  Anasarca Patient has diffuse swelling, worse in dependent areas. Also noted to have a large left sided pleural effusion. He has chronic lower extremity lymphedema 2/2 prostate cancer treated in 2008. Patient was recently admitted for similar presentation in 11/2017 and was treated for chylous ascites and received diuresis and pericentesis. Etiology of symptoms unknown from previous admission. Cardiac causes effectively ruled out with a normal echo on 9/19. Intra-abdominal pathology also less likely given abdominal CT w/o contrast which showed no cirrhosis, obvious masses, or portal hypertension. Patient also had EGD/Colonoscopy which did not reveal an obvious source of his chylous ascites. Patient was discharged on q 2 day lasix due to kidney function on dc. He has had increasing daily weights since discharge per his charted weights that he keeps. Less likely secondary to renal failure with relatively stable renal function since last admission. Labs show normal WBC count 5.0, BNP 54.9. His weight is 193 pounds on admission, was 180 on d/c on 9/23.  - admit to inpatient family medicine, admit to [facility], attending Dr. Nori Powell - vital signs per floor routine - consult GI- previously seen by Dr. Alessandra Powell -  abdominal US to evaluate ascites - Lasix IV 40mg  x2 and reassess 10/22 - strict I/Os, daily weights - speech eval for MOCHA  Acute on chronic kidney injury Patient has chronic kidney disease stage III. GFR 33 on admission and Creatinine 1.89 on admission, stable from 1.96 on d/c 9/25. Will need to watch renal function carefully during diuresis. - daily bmp - lasix 40mg  IV x2 as above - avoid nephrotoxic agents - home hctz and rampril held at dc, continue to hold  Chronic Hyponatremia Likely secondary to fluid overload. Na+ on admission is 131. Patient is asymptomatic.  - BMP am - continue to monitor for signs of hyponatremia - add NS if becomes symptomatic (HA, N/V, confusion, etc) - fluid restriction  Mechanical mitral valve/ Anticoagulation audible mechanical click on physical exam heard best on left sternal border. Consulting pharmacy for warfarin dosing.  - lovenox bridge to warfarin for dvt ppx - monitor on physical exam - continuous cardiac monitoring - warfarin dosing per pharm recs  Macrocytic anemia B12 89 and MMA 3401. Sees heme/onc for this and h/o prostate cancer. Gets chronic B12 supplementation per notes in the chart from Mario Powell of hematology. - CBC am - continue B12 supplementation  Neuroendocrine cancer Per hematology note from 10/10, believed to be very early low-grade tumor which was removed with endoscopy. Was to follow with GI clinically.  - outpatient referral to heme/onc from last admission - continue to monitor for signs of exacerbation - f/u with patient's heme/onc physician  Essential hypertension poorly controlled on this admission. Systolic 962-229, diastolic 79-892 - continue to diurese as above - vital signs per floor protocol   Paroxysmal atrial fibrillation- home dose warfarin 1.5mg  M,W,F and 1mg  all other days. INR is  2.32 on admission which subtherapeutic for mechanical valve. Will consult pharmacy for dosing recommendations. Bridge to  therapeutic warfarin level with lovenox. - continue warfarin - lovenox 40mg  daily bridge - appreciate pharmacy recs  Pancytopenia Workup outpatient per heme/onc. retic count, iron studies, folic acid, Q33, MMA, SPEP with immunofixation, light chain levels, LDH, haptoglobin ordered. - outpatient referral to heme/onc from last admission - CBC am  H/O Prostate cancer S/P resection back in 2008. PSA 0.18 on 09/23 with no new symptoms. Sees urology yearly.  Hyperlipidemia Last cholesterol panel 12/2014, chol 132, HDL 36, LDL 71 - continue home rosuvastatin 40mg  - continue home ezetimibe 10mg  - repeat lipid panel - holding home niacin  GERD - continue home protonix   FEN/GI: heart healthy diet Prophylaxis: lovenox bridge to therapeutic coumadin level  Disposition: med surg for symptomatic improvement and diagnostic workup per GI  History of Present Illness:  Mario Powell is a 72 y.o. male presenting with increased weight gain and swelling since discharge 9/24 for similar problem. He received extensive workup, diuresis and paracentesis at that time with plan to follow up for further diagnosis and treatment outpatient. He was followed up outpatient with his PCP Mario Powell at family practice and with was set up for follow up with GI on Oct 23rd and with a heme/onc/lymph specialist at [facility]. He returned to ED today due to excessive swelling in his abdomen and legs which he states is not the worse that it's ever been but is worse since his recent discharge. He has also noticed scrotal edema over the last 3-4 days. He had no increasing shortness of breath with activity such as walking. His breathing is worse when he is lying flat on his back. Has had very intense pruritis. This feels very similar to when he used to take niacin. Has felt good urine output. Feels that he urinates about the same when taking the lasix vs not. Dry weight 180 when he left from previous admission. He is 178.9lbs this  admission. He states that he had a difficult BM today.   On last admission: paracentesis took off about 5 liters of chylus fluid, had a colonoscopy which showed erythematous, hemorrhagic, inflamed and petechial mucosa in the entire examined colon. He had a 57mm polyp in the rectum which was removed. He also had an egd which showed 2 gastric polyps. Sees hematology for a possible Neuroendocrine tumor. S/P treatment of prostate cancer. He goes to checkups yearly. Free PSA 0.18 on 12/06/2017.  Daughter is at bedside who provided some clarification but patient was own historian and had very difficult time in explaining his present illness. He was unable to articulate many words and would say "dadada".  Review Of Systems: Per HPI with the following additions:   Review of Systems  Constitutional: Positive for weight loss (weight GAIN). Negative for diaphoresis, fever and malaise/fatigue.  Respiratory: Positive for shortness of breath.   Cardiovascular: Positive for leg swelling.  Gastrointestinal: Positive for constipation. Negative for abdominal pain.  Skin: Negative for rash.    Patient Active Problem List   Diagnosis Date Noted  . 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 Anemia 12/23/2017  . Neuroendocrine cancer ([place]) 12/18/2017  . Hernia of anterior abdominal wall 12/18/2017  . Pseudogout of knee, right 12/18/2017  . 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 Protein-calorie malnutrition, severe 12/08/2017  . Lymphedema   . Pancytopenia ([place])   . Chylous ascites   . AKI (acute kidney injury) (  [place])   . Hyponatremia   . Anticoagulated   . Anasarca 12/01/2017  . Renal insufficiency   . Encounter for therapeutic drug monitoring 04/16/2017  . Essential hypertension 07/12/2013  . Prostate cancer ([place]) 07/12/2013  . Chronic anticoagulation 07/12/2013  . Paroxysmal atrial fibrillation ([place]) 07/12/2013  . History of mitral valve replacement with  mechanical valve 01/11/2013    Past Medical History: Past Medical History:  Diagnosis Date  . Anasarca 12/01/2017  . Atrial fibrillation ([place]) 07/12/2013   Perioperative, 2001   . CAD (coronary artery disease) of artery bypass graft 07/12/2013   Saphenous vein graft 2001   . Chronic anticoagulation 07/12/2013  . Essential hypertension 07/12/2013  . History of mitral valve replacement with mechanical valve 01/11/2013   Mechanical mitral valve 2001  Bacterial endocarditis as the cause   . HOH (hard of hearing)   . Prostate cancer ([place]) 07/12/2013    Past Surgical History: Past Surgical History:  Procedure Laterality Date  . BIOPSY  12/06/2017   Procedure: BIOPSY;  Surgeon: Otis Brace, MD;  Location: [place];  Service: Gastroenterology;;  . COLONOSCOPY WITH PROPOFOL N/A 12/06/2017   Procedure: COLONOSCOPY WITH PROPOFOL ;  Surgeon: Otis Brace, MD;  Location: [place];  Service: Gastroenterology;  Laterality: N/A;  . CORONARY ARTERY BYPASS GRAFT     2001  . ESOPHAGOGASTRODUODENOSCOPY (EGD) WITH PROPOFOL N/A 12/06/2017   Procedure: ESOPHAGOGASTRODUODENOSCOPY (EGD) WITH PROPOFOL;  Surgeon: Otis Brace, MD;  Location: [facility];  Service: Gastroenterology;  Laterality: N/A;  . IR PARACENTESIS  12/03/2017  . MITRAL VALVE REPLACEMENT  2001   Mechanical prosthesis  . POLYPECTOMY  12/06/2017   Procedure: POLYPECTOMY;  Surgeon: Otis Brace, MD;  [facility]: [facility];  Service: Gastroenterology;;    Social History: Social History   Tobacco Use  . Smoking status: Former Research scientist (life sciences), quit in 1986  . Smokeless tobacco: Never Used  . Tobacco comment:  Substance Use Topics  . Alcohol use: Not Currently    Comment: NO ALCOHOL SINCE CHRISTMAS 2017  . Drug use: Never   Additional social history: remote smoking history, does not drink alcohol or use illicit drugs. Please also refer to relevant sections of EMR.  Family History: Family History  Problem Relation  Age of Onset  . Heart attack Father   . Healthy Mother   . Healthy Sister   . Healthy Sister    Allergies and Medications: Allergies  Allergen Reactions  . Penicillins Other (See Comments)    REACTION: UNKNOWN   No current facility-administered medications on file prior to encounter.    Current Outpatient Medications on File Prior to Encounter  Medication Sig Dispense Refill  . Amino Acids-Protein Hydrolys (FEEDING SUPPLEMENT, PRO-STAT SUGAR FREE 64,) LIQD Take 30 mLs by mouth 3 (three) times daily with meals. 900 mL 0  . ezetimibe (ZETIA) 10 MG tablet Take 1 tablet (10 mg total) by mouth daily. 90 tablet 3  . furosemide (LASIX) 40 MG tablet Take 1 tablet (40 mg total) by mouth every other day. 10 tablet 2  . medium chain triglycerides (MCT OIL) oil Take 15 mLs by mouth 3 (three) times daily with meals. 946 mL 12  . Multiple Vitamin (MULTIVITAMIN WITH MINERALS) TABS tablet Take 1 tablet by mouth daily. 90 tablet 3  . niacin (NIASPAN) 1000 MG CR tablet Take 1 tablet (1,000 mg total) by mouth at bedtime. 90 tablet 3  . pantoprazole (PROTONIX) 40 MG tablet Take 1 tablet (40 mg total) by mouth 2 (two) times daily.  58 tablet 0  . rosuvastatin (CRESTOR) 40 MG tablet Take 1 tablet (40 mg total) by mouth daily. 90 tablet 3  . warfarin (COUMADIN) 1 MG tablet TAKE AS DIRECTED BY ANTICOAGULATION CLINIC (Patient taking differently: Take 1 mg by mouth See admin instructions. Take 1 tablet (1MG ) by mouth daily except on Mon and Fridays take 1/2 tablet (0.5MG ).) 110 tablet 1    Objective: BP (!) 182/105   Pulse 72   Temp 97.6 F (36.4 C) (Oral)   Resp (!) 27   Ht 5\' 11"  (1.803 m)   Wt 87.5 kg   SpO2 100%   BMI 26.92 kg/m  Exam: General: elderly caucasian male, no acute distress Eyes: no scleral icterus HEENT: bilateral hearing aids Neck: soft, non tender Cardiovascular: irregular rhythm, normal rate, mechanical click heard best on left sternal border Respiratory: CTAB, diminished lung  sounds bilaterally lower lobes particularly on left. Gastrointestinal: distended abdomen with positive fluid wave, non-tender to palpation, was not able to palpation organomegaly or masses. + fluid to percussion up to medial termination of obliques bilaterally MSK: severe bilateral lower extremity swelling 3+ pitting ascending to mid thigh. Scrotal swelling to size of grapefruit, non tender, soft/loose skin, testicles present in scrotal sac bilaterally. Swelling in upper extremities left worse than right, 1+ pitting Derm: hardening of skin lower extremities, no rashes or lesions Neuro: alert and oriented, sensation intact bilateral lower extremities Psych: cooperative, significant word-finding difficulty, flat affect  Labs and Imaging: CBC BMET  Recent Labs  Lab 01/03/18 1114  WBC 5.0  HGB 12.2*  HCT 37.9*  PLT 217   Recent Labs  Lab 01/03/18 1114  NA 131*  K 4.2  CL 98  CO2 25  BUN 23  CREATININE 1.89*  GLUCOSE 138*  CALCIUM 8.6Richarda Osmond, DO 01/03/2018, 2:38 PM PGY-1, [place] Intern pager: [phone], text pages welcome ------------------------------------------------------------------------------------------------------------ Upper Level Addendum: I have seen and evaluated this patient along with Dr. Ouida Sills and reviewed the above note, making necessary revisions in brown.  Guadalupe Dawn MD PGY-2 Family Medicine Resident

## 2018-01-03 NOTE — ED Notes (Signed)
Pt remains in ultrasound at this time.

## 2018-01-03 NOTE — ED Provider Notes (Signed)
[facility] EMERGENCY DEPARTMENT Provider Note   CSN: [id number] Arrival date & time: 01/03/18  1043     History   Chief Complaint Chief Complaint  Patient presents with  . Shortness of Breath    HPI Mario Powell is a 72 y.o. male.  HPI  72 year old male with a history of anasarca and chronic warfarin use due to mitral valve replacement presents with worsening swelling.  For about 2 weeks he has been slowly gaining fluid and is up 13 pounds from discharge last month.  He feels short of breath.  He is been having abdominal swelling without pain and feels like he might need a repeat paracentesis.  He also has chronic bilateral lower extremity swelling, right greater than left, that is getting worse.  Takes Lasix every other day.  No cough or chest pain.  Past Medical History:  Diagnosis Date  . Anasarca 12/01/2017  . Atrial fibrillation ([place]) 07/12/2013   Perioperative, 2001   . CAD (coronary artery disease) of artery bypass graft 07/12/2013   Saphenous vein graft 2001   . Chronic anticoagulation 07/12/2013  . Essential hypertension 07/12/2013  . History of mitral valve replacement with mechanical valve 01/11/2013   Mechanical mitral valve 2001  Bacterial endocarditis as the cause   . HOH (hard of hearing)   . Prostate cancer ([place]) 07/12/2013    Patient Active Problem List   Diagnosis Date Noted  . 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 Anemia 12/23/2017  . Neuroendocrine cancer ([place]) 12/18/2017  . Hernia of anterior abdominal wall 12/18/2017  . Pseudogout of knee, right 12/18/2017  . 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 Protein-calorie malnutrition, severe 12/08/2017  . Lymphedema   . Pancytopenia ([place])   . Chylous ascites   . AKI (acute kidney injury) ([place])   . Hyponatremia   . Anticoagulated   . Anasarca 12/01/2017  . Renal insufficiency   . Encounter for therapeutic drug monitoring 04/16/2017  .  Essential hypertension 07/12/2013  . Prostate cancer ([place]) 07/12/2013  . Chronic anticoagulation 07/12/2013  . Paroxysmal atrial fibrillation ([place]) 07/12/2013  . History of mitral valve replacement with mechanical valve 01/11/2013    Past Surgical History:  Procedure Laterality Date  . BIOPSY  12/06/2017   Procedure: BIOPSY;  Surgeon: Otis Brace, MD;  Location: [facility];  Service: Gastroenterology;;  . COLONOSCOPY WITH PROPOFOL N/A 12/06/2017   Procedure: COLONOSCOPY WITH PROPOFOL ;  Surgeon: Otis Brace, MD;  Location: [facility];  Service: Gastroenterology;  Laterality: N/A;  . CORONARY ARTERY BYPASS GRAFT     2001  . ESOPHAGOGASTRODUODENOSCOPY (EGD) WITH PROPOFOL N/A 12/06/2017   Procedure: ESOPHAGOGASTRODUODENOSCOPY (EGD) WITH PROPOFOL;  Surgeon: Otis Brace, MD;  Location: [facility];  Service: Gastroenterology;  Laterality: N/A;  . IR PARACENTESIS  12/03/2017  . MITRAL VALVE REPLACEMENT  2001   Mechanical prosthesis  . POLYPECTOMY  12/06/2017   Procedure: POLYPECTOMY;  Surgeon: Otis Brace, MD;  [facility];  Service: Gastroenterology;;        Home Medications    Prior to Admission medications   Medication Sig Start Date End Date Taking? Authorizing Provider  Amino Acids-Protein Hydrolys (FEEDING SUPPLEMENT, PRO-STAT SUGAR FREE 64,) LIQD Take 30 mLs by mouth 3 (three) times daily with meals. 12/09/17   Wilber Oliphant, MD  ezetimibe (ZETIA) 10 MG tablet Take 1 tablet (10 mg total) by mouth daily. 01/11/17   Belva Crome, MD  furosemide (LASIX) 40 MG tablet Take  1 tablet (40 mg total) by mouth every other day. 12/17/17 01/16/18  Wilber Oliphant, MD  medium chain triglycerides (MCT OIL) oil Take 15 mLs by mouth 3 (three) times daily with meals. 12/08/17   Wilber Oliphant, MD  Multiple Vitamin (MULTIVITAMIN WITH MINERALS) TABS tablet Take 1 tablet by mouth daily. 12/09/17   Wilber Oliphant, MD  niacin (NIASPAN) 1000 MG CR tablet Take 1 tablet (1,000  mg total) by mouth at bedtime. 01/11/17   Belva Crome, MD  pantoprazole (PROTONIX) 40 MG tablet Take 1 tablet (40 mg total) by mouth 2 (two) times daily. 12/08/17 01/06/18  Wilber Oliphant, MD  rosuvastatin (CRESTOR) 40 MG tablet Take 1 tablet (40 mg total) by mouth daily. 01/11/17   Belva Crome, MD  warfarin (COUMADIN) 1 MG tablet TAKE AS DIRECTED BY ANTICOAGULATION CLINIC Patient taking differently: Take 1 mg by mouth See admin instructions. Take 1 tablet (1MG ) by mouth daily except on Mon and Fridays take 1/2 tablet (0.5MG ). 07/27/17   Belva Crome, MD    Family History Family History  Problem Relation Age of Onset  . Heart attack Father   . Healthy Mother   . Healthy Sister   . Healthy Sister     Social History Social History   Tobacco Use  . Smoking status: Former Research scientist (life sciences)  . Smokeless tobacco: Never Used  . Tobacco comment: QUIT IN 09/1982  Substance Use Topics  . Alcohol use: Not Currently    Comment: NO ALCOHOL SINCE CHRISTMAS 2017  . Drug use: Never     Allergies   Penicillins   Review of Systems Review of Systems  Constitutional: Negative for fever.  Respiratory: Positive for shortness of breath. Negative for cough.   Cardiovascular: Positive for leg swelling.  Gastrointestinal: Positive for abdominal distention. Negative for abdominal pain and vomiting.  All other systems reviewed and are negative.    Physical Exam Updated Vital Signs BP (!) 182/105   Pulse 72   Temp 97.6 F (36.4 C) (Oral)   Resp (!) 27   Ht 5\' 11"  (1.803 m)   Wt 87.5 kg   SpO2 100%   BMI 26.92 kg/m   Physical Exam  Constitutional: He appears well-developed and well-nourished.  Non-toxic appearance. He does not appear ill. No distress.  HENT:  Head: Normocephalic and atraumatic.  Right Ear: External ear normal.  Left Ear: External ear normal.  Nose: Nose normal.  Eyes: Right eye exhibits no discharge. Left eye exhibits no discharge.  Neck: Neck supple.  Cardiovascular:  Normal rate, regular rhythm and normal heart sounds.  Pulmonary/Chest: Effort normal. He has decreased breath sounds in the left lower field.  Abdominal: Soft. He exhibits distension (mild). There is no tenderness.  Musculoskeletal:       Right lower leg: He exhibits edema.       Left lower leg: He exhibits edema.  Neurological: He is alert.  Skin: Skin is warm and dry.  Psychiatric: His mood appears not anxious.  Nursing note and vitals reviewed.    ED Treatments / Results  Labs (all labs ordered are listed, but only abnormal results are displayed) Labs Reviewed  BASIC METABOLIC PANEL - Abnormal; Notable for the following components:      Result Value   Sodium 131 (*)    Glucose, Bld 138 (*)    Creatinine, Ser 1.89 (*)    Calcium 8.6 (*)    GFR calc non Af Amer 33 (*)  GFR calc Af Amer 39 (*)    All other components within normal limits  CBC - Abnormal; Notable for the following components:   RBC 3.69 (*)    Hemoglobin 12.2 (*)    HCT 37.9 (*)    MCV 102.7 (*)    All other components within normal limits  PROTIME-INR  BRAIN NATRIURETIC PEPTIDE  I-STAT TROPONIN, ED    EKG EKG Interpretation  Date/Time:  Monday January 03 2018 10:51:27 EDT Ventricular Rate:  88 PR Interval:  112 QRS Duration: 82 QT Interval:  354 QTC Calculation: 428 R Axis:   84 Text Interpretation:  Normal sinus rhythm Low voltage QRS Borderline ECG no significant change since Sept 2019 Confirmed by Sherwood Gambler [phone]) on 01/03/2018 1:09:53 PM   Radiology Dg Chest 2 View  Result Date: 01/03/2018 CLINICAL DATA:  Sob for several days EXAM: CHEST - 2 VIEW COMPARISON:  12/01/2017 FINDINGS: Sternotomy wires overlies stable enlarged cardiac silhouette is increasing LEFT pleural effusion. No pulmonary edema. No pneumothorax. No acute osseous abnormality. IMPRESSION: Increasing LEFT unilateral pleural effusion. Electronically Signed   By: Suzy Bouchard M.D.   On: 01/03/2018 11:57     Procedures Procedures (including critical care time)  Medications Ordered in ED Medications  furosemide (LASIX) injection 40 mg (has no administration in time range)     Initial Impression / Assessment and Plan / ED Course  I have reviewed the triage vital signs and the nursing notes.  Pertinent labs & imaging results that were available during my care of the patient were reviewed by me and considered in my medical decision making (see chart for details).     Patient is overall fluid overloaded.  While he does appear to have some mild ascites clinically, he does not have any abdominal pain or tenderness and so my suspicion of SBP is pretty low.  I do not think he needs an emergent tap.  I do not know that the ascites is the primary problem anyway compared to his overall anasarca.  I will start him on IV diuresis and think he probably needs to be brought in for continued diuresis in the hospital.  Family practice to admit.  Final Clinical Impressions(s) / ED Diagnoses   Final diagnoses:  Pleural effusion, left    ED Discharge Orders    None       Sherwood Gambler, MD 01/03/18 1510

## 2018-01-03 NOTE — Telephone Encounter (Signed)
-----   Message from Truitt Merle, MD sent at 01/01/2018  7:10 PM EDT ----- Please let pt know that his B12 level is low, and I recommend him to start oral B12 1043mcg daily, and monthly  B12 injection, if he agrees, please set up his appointment, thanks  Truitt Merle  01/01/2018

## 2018-01-03 NOTE — ED Notes (Signed)
Pt returned from ultrasound.  Food tray given to pt

## 2018-01-03 NOTE — Progress Notes (Signed)
Patient admitted to the unit via stretcher from the ED. Patient resting comfortably in bed in no acute distress. Daughter at bedside. Plan of care discussed with family in regards to lovenox to coumadin bridge. Family medicine paged about patient and daughters request. Patient safety Guide given and patient and daughter oriented to room.

## 2018-01-03 NOTE — ED Notes (Signed)
Dinner tray ordered.

## 2018-01-03 NOTE — Progress Notes (Signed)
ANTICOAGULATION CONSULT NOTE - Initial Consult  Pharmacy Consult for warfarin Indication: VTE prophylaxis due to prosthetic mitral heart valve  Allergies  Allergen Reactions  . Penicillins Other (See Comments)    REACTION: UNKNOWN    Patient Measurements: Height: 5\' 11"  (180.3 cm) Weight: 193 lb (87.5 kg) IBW/kg (Calculated) : 75.3  Vital Signs: Temp: 97.6 F (36.4 C) (10/21 1206) Temp Source: Oral (10/21 1206) BP: 162/94 (10/21 1600) Pulse Rate: 91 (10/21 1600)  Labs: Recent Labs    01/03/18 1114  HGB 12.2*  HCT 37.9*  PLT 217  LABPROT 25.1*  INR 2.32  CREATININE 1.89*    Estimated Creatinine Clearance: 36.5 mL/min (A) (by C-G formula based on SCr of 1.89 mg/dL (H)).   Medical History: Past Medical History:  Diagnosis Date  . Anasarca 12/01/2017  . Atrial fibrillation ([place]) 07/12/2013   Perioperative, 2001   . CAD (coronary artery disease) of artery bypass graft 07/12/2013   Saphenous vein graft 2001   . Chronic anticoagulation 07/12/2013  . Essential hypertension 07/12/2013  . History of mitral valve replacement with mechanical valve 01/11/2013   Mechanical mitral valve 2001  Bacterial endocarditis as the cause   . HOH (hard of hearing)   . Prostate cancer ([place]) 07/12/2013    Medications:  Scheduled:  Infusions:   Assessment: Pt is a 36 YOM presenting with increased SOB and fluid retention in  abdomen and bilateral legs. Pt has h/o paracentesis with 13 lb weight gain since procedure in September. Pt on chronic warfarin due to mitral valve replacement with INR goal of 2.5-3.5. Warfarin PTA dose: warfarin 1.5mg  Mon/Wed/Fri, warfarin 1mg  Sun/Tue/Thurs/Sat; pt reports last dose 01/02/18 at 0700. INR today is subtherapeutic at 2.32. Discussed adding Lovenox bridge in subtherapeutic INR in mechanical valve pt until INR is therapeutic with MD intern. Hgb - 12.2, Plt - 217  Goal of Therapy:  INR 2.5-3.5 Monitor platelets by anticoagulation protocol: Yes   Plan:   Warfarin 2mg  x1 dose Will monitor daily INR for further warfarin doses Lovenox 130mg  SQ q24h until INR is therapeutic  Please discontinue SQ Lovenox when INR is therapeutic at INR 2.5-3.5  Tyson Babinski 01/03/2018,4:49 PM

## 2018-01-04 ENCOUNTER — Encounter (HOSPITAL_COMMUNITY): Payer: Self-pay | Admitting: Student

## 2018-01-04 ENCOUNTER — Inpatient Hospital Stay (HOSPITAL_COMMUNITY): Payer: Medicare Other

## 2018-01-04 DIAGNOSIS — J90 Pleural effusion, not elsewhere classified: Secondary | ICD-10-CM

## 2018-01-04 HISTORY — PX: IR PARACENTESIS: IMG2679

## 2018-01-04 LAB — CBC WITH DIFFERENTIAL/PLATELET
ABS IMMATURE GRANULOCYTES: 0.01 10*3/uL (ref 0.00–0.07)
BASOS PCT: 1 %
Basophils Absolute: 0 10*3/uL (ref 0.0–0.1)
Eosinophils Absolute: 0.2 10*3/uL (ref 0.0–0.5)
Eosinophils Relative: 6 %
HCT: 30.9 % — ABNORMAL LOW (ref 39.0–52.0)
HEMOGLOBIN: 9.8 g/dL — AB (ref 13.0–17.0)
IMMATURE GRANULOCYTES: 0 %
LYMPHS PCT: 7 %
Lymphs Abs: 0.2 10*3/uL — ABNORMAL LOW (ref 0.7–4.0)
MCH: 32 pg (ref 26.0–34.0)
MCHC: 31.7 g/dL (ref 30.0–36.0)
MCV: 101 fL — ABNORMAL HIGH (ref 80.0–100.0)
MONO ABS: 0.5 10*3/uL (ref 0.1–1.0)
MONOS PCT: 15 %
NEUTROS ABS: 2.1 10*3/uL (ref 1.7–7.7)
Neutrophils Relative %: 71 %
PLATELETS: 159 10*3/uL (ref 150–400)
RBC: 3.06 MIL/uL — AB (ref 4.22–5.81)
RDW: 13.7 % (ref 11.5–15.5)
WBC: 2.9 10*3/uL — AB (ref 4.0–10.5)
nRBC: 0 % (ref 0.0–0.2)

## 2018-01-04 LAB — BODY FLUID CELL COUNT WITH DIFFERENTIAL
LYMPHS FL: 57 %
Monocyte-Macrophage-Serous Fluid: 27 % — ABNORMAL LOW (ref 50–90)
NEUTROPHIL FLUID: 16 % (ref 0–25)
WBC FLUID: 138 uL (ref 0–1000)

## 2018-01-04 LAB — COMPREHENSIVE METABOLIC PANEL
ALBUMIN: 2.2 g/dL — AB (ref 3.5–5.0)
ALT: 36 U/L (ref 0–44)
ANION GAP: 7 (ref 5–15)
AST: 58 U/L — AB (ref 15–41)
Alkaline Phosphatase: 70 U/L (ref 38–126)
BUN: 23 mg/dL (ref 8–23)
CHLORIDE: 100 mmol/L (ref 98–111)
CO2: 26 mmol/L (ref 22–32)
Calcium: 8.3 mg/dL — ABNORMAL LOW (ref 8.9–10.3)
Creatinine, Ser: 2.05 mg/dL — ABNORMAL HIGH (ref 0.61–1.24)
GFR calc Af Amer: 35 mL/min — ABNORMAL LOW (ref >60)
GFR calc non Af Amer: 30 mL/min — ABNORMAL LOW (ref >60)
GLUCOSE: 126 mg/dL — AB (ref 70–99)
POTASSIUM: 4 mmol/L (ref 3.5–5.1)
Sodium: 133 mmol/L — ABNORMAL LOW (ref 135–145)
TOTAL PROTEIN: 4.6 g/dL — AB (ref 6.5–8.1)
Total Bilirubin: 0.6 mg/dL (ref 0.3–1.2)

## 2018-01-04 LAB — AMYLASE, PLEURAL OR PERITONEAL FLUID: Amylase, Fluid: 104 U/L

## 2018-01-04 LAB — LACTATE DEHYDROGENASE, PLEURAL OR PERITONEAL FLUID: LD, Fluid: 105 U/L — ABNORMAL HIGH (ref 3–23)

## 2018-01-04 LAB — PROTEIN, PLEURAL OR PERITONEAL FLUID

## 2018-01-04 LAB — GLUCOSE, PLEURAL OR PERITONEAL FLUID: GLUCOSE FL: 133 mg/dL

## 2018-01-04 LAB — LIPID PANEL
CHOL/HDL RATIO: 3.8 ratio
CHOLESTEROL: 102 mg/dL (ref 0–200)
HDL: 27 mg/dL — ABNORMAL LOW (ref >40)
LDL Cholesterol: 65 mg/dL (ref 0–99)
Triglycerides: 51 mg/dL (ref <150)
VLDL: 10 mg/dL (ref 0–40)

## 2018-01-04 LAB — GRAM STAIN

## 2018-01-04 LAB — ALBUMIN, PLEURAL OR PERITONEAL FLUID: Albumin, Fluid: 1.6 g/dL

## 2018-01-04 LAB — PROTIME-INR
INR: 2.2
Prothrombin Time: 24.1 seconds — ABNORMAL HIGH (ref 11.4–15.2)

## 2018-01-04 LAB — HEPARIN LEVEL (UNFRACTIONATED): Heparin Unfractionated: <0.1 IU/mL — ABNORMAL LOW (ref 0.30–0.70)

## 2018-01-04 MED ORDER — LIDOCAINE HCL 1 % IJ SOLN
INTRAMUSCULAR | Status: AC
  Filled 2018-01-04: qty 20

## 2018-01-04 MED ORDER — HEPARIN (PORCINE) IN NACL 100-0.45 UNIT/ML-% IJ SOLN
1250.0000 [IU]/h | INTRAMUSCULAR | Status: DC
  Administered 2018-01-05 – 2018-01-06 (×2): 1250 [IU]/h via INTRAVENOUS
  Filled 2018-01-04 (×2): qty 250

## 2018-01-04 MED ORDER — FUROSEMIDE 10 MG/ML IJ SOLN
40.0000 mg | Freq: Two times a day (BID) | INTRAMUSCULAR | Status: DC
  Administered 2018-01-04 – 2018-01-06 (×5): 40 mg via INTRAVENOUS
  Filled 2018-01-04 (×5): qty 4

## 2018-01-04 MED ORDER — LIDOCAINE HCL 1 % IJ SOLN
INTRAMUSCULAR | Status: AC | PRN
  Administered 2018-01-04: 10 mL

## 2018-01-04 MED ORDER — CYANOCOBALAMIN 1000 MCG/ML IJ SOLN
1000.0000 ug | Freq: Once | INTRAMUSCULAR | Status: AC
  Administered 2018-01-04: 1000 ug via INTRAMUSCULAR
  Filled 2018-01-04: qty 1

## 2018-01-04 MED ORDER — ALBUMIN HUMAN 25 % IV SOLN
25.0000 g | Freq: Once | INTRAVENOUS | Status: AC
  Administered 2018-01-04: 25 g via INTRAVENOUS
  Filled 2018-01-04: qty 50

## 2018-01-04 MED ORDER — WARFARIN SODIUM 2 MG PO TABS
2.0000 mg | ORAL_TABLET | Freq: Once | ORAL | Status: AC
  Administered 2018-01-04: 2 mg via ORAL
  Filled 2018-01-04: qty 1

## 2018-01-04 MED ORDER — HEPARIN (PORCINE) IN NACL 100-0.45 UNIT/ML-% IJ SOLN
800.0000 [IU]/h | INTRAMUSCULAR | Status: DC
  Administered 2018-01-04: 800 [IU]/h via INTRAVENOUS
  Filled 2018-01-04: qty 250

## 2018-01-04 MED ORDER — WARFARIN - PHARMACIST DOSING INPATIENT
Freq: Every day | Status: DC
  Administered 2018-01-05: 19:00:00

## 2018-01-04 NOTE — Procedures (Signed)
PROCEDURE SUMMARY:  Successful image-guided paracentesis from the right lower abdomen.  Yielded 4 liters of light yellow, milky thick fluid.  No immediate complications.  Patient tolerated well.   Specimen was sent for labs.  Claris Pong Louk PA-C 01/04/2018 3:28 PM

## 2018-01-04 NOTE — Progress Notes (Signed)
CALL PAGER [phone] for any questions or notifications regarding this patient  FMTS Attending Note: Dorcas Mcmurray MD Complex clinical situation. I have discussed with D. Owens Shark who was attending at last admission.  Chart review reveals: Hx prostate cancer with NM scan whole body 2008: No focal activity within the axial or appendicular skeleton suggesting metastatic prostate cancer. There is intense uptake in the posterior right calcaneus and left mid foot consistent with degenerative/traumatic change.   1. No evidence of metastatic skeletal prostate cancer.  2. Degenerative vs traumatic changes as described above  EGD and biopsy 11/2017: Stomach, polyp(s) - WELL DIFFERENTIATED NEUROENDOCRINE TUMOR, GRADE 1. - TUMOR IS <0.1 CM OF THE CAUTERIZED EDGE. - HYPERPLASTIC POLYP WITH INTESTINAL METAPLASIA. 2. Stomach, biopsy, Random - CHRONIC GASTRITIS WITH INTESTINAL METAPLASIA. - HELICOBACTER PYLORI IMMUNOHISTOCHEMISTRY IS NEGATIVE. - NO DYSPLASIA OR MALIGNANCY. Microscopic Comment 1. The neuroendocrine tumor has a low mitotic rate <2% and Ki-67 around 2%.  CT abdomen without contrast negative 11/2017  Abdominal US 01/03/2018 Moderate ascites   CXR 01/03/2018: FINDINGS: Sternotomy wires overlies stable enlarged cardiac silhouette is increasing LEFT pleural effusion. No pulmonary edema. No pneumothorax. No acute osseous abnormality.  Increasing LEFT unilateral pleural effusion.  Labs: Previous abnl labs: 2week ago: retic up at 5.4 with increased immature fraction 20.5 (2.3-15.9) ? Low B12 at 89 (90-900) with MMA 3401 (0-378) ? Normal complement ? Iron low at 37 with nl TIBC, low haptoglobin at <10  (34-200) ? ELP:  Kappa free light chain 3.3 - 19.4 mg/L 116.7High   Lamda free light chains 5.7 - 26.3 mg/L 48.5High   Kappa, lamda light chain ratio 0.26 - 1.65 2.41High    Total Protein ELP 6.0 - 8.5 g/dL      5.0Low    Albumin ELP 2.9 - 4.4 g/dL                 2.6Low    Alpha-1-Globulin 0.0  - 0.4 g/dL          0.3   Alpha-2-Globulin 0.4 - 1.0 g/dL          0.4   Beta Globulin 0.7 - 1.3 g/dL          1.1   Gamma Globulin 0.4 - 1.8 g/dL         0.6   M-Spike, % Not Observed g/dL          0.1High    GLOBULIN, TOTAL 2.2 - 3.9 g/dL 2.4 VC 1.8 R A/G Ratio 0.7 - 1.7                                  1.1 VC   LABS NOW: Hgb 12.2 up from 10.3 two wk ago PT 25 (11-15) and INR 2.32

## 2018-01-04 NOTE — Progress Notes (Addendum)
ANTICOAGULATION CONSULT NOTE - Initial Consult  Pharmacy Consult for warfarin Indication: VTE prophylaxis due to prosthetic mitral heart valve.  Allergies  Allergen Reactions  . Penicillins Other (See Comments)    REACTION: UNKNOWN    Patient Measurements: Height: 5\' 11"  (180.3 cm) Weight: 190 lb (86.2 kg) IBW/kg (Calculated) : 75.3  Heparin dosing weight: 86.2 kg  Vital Signs: Temp: 98.9 F (37.2 C) (10/22 0638) Temp Source: Oral (10/22 3545) BP: 101/65 (10/22 6256) Pulse Rate: 88 (10/22 0638)  Labs: Recent Labs    01/03/18 1114 01/04/18 0343  HGB 12.2* 9.8*  HCT 37.9* 30.9*  PLT 217 159  LABPROT 25.1*  --   INR 2.32  --   CREATININE 1.89* 2.05*    Estimated Creatinine Clearance: 33.7 mL/min (A) (by C-G formula based on SCr of 2.05 mg/dL (H)).   Medical History: Past Medical History:  Diagnosis Date  . Anasarca 12/01/2017  . Atrial fibrillation ([place]) 07/12/2013   Perioperative, 2001   . CAD (coronary artery disease) of artery bypass graft 07/12/2013   Saphenous vein graft 2001   . Chronic anticoagulation 07/12/2013  . Essential hypertension 07/12/2013  . History of mitral valve replacement with mechanical valve 01/11/2013   Mechanical mitral valve 2001  Bacterial endocarditis as the cause   . HOH (hard of hearing)   . Prostate cancer ([place]) 07/12/2013     Assessment: Pt is a 3 YOM presented 01/03/18 with increased SOB and fluid retention in  abdomen and bilateral legs. Pt has h/o paracentesis with 13 lb weight gain since procedure in September. Pt on chronic warfarin due to mitral valve replacement with INR goal of 2.5-3.5. Warfarin PTA dose: warfarin 1.5mg  Mon/Wed/Fri, warfarin 1mg  Sun/Tue/Thurs/Sat; pt reports last dose 01/02/18 at 0700.  INR 2.32 on admit, subtherapeutic.  Thus Lovenox bridge ordered 10/21 but Lovenox dose & warfarin dose was held last night per MD order.  Anticipating procedure in next 24-48 hours,  Pharmacy consulted to start IV heparin  drip and lovenox protocol discontinued.   Warfarin discontinued/held for procedure. Hgb decreased 9.8<12.2,  pltc decreased 159<217k.  No bleeding reported  Goal of Therapy:  INR 2.5-3.5 Monitor platelets by anticoagulation protocol: Yes   Plan:  Follow up INR today -ordered.  Warfarin discontinued/held for procedure. Lovenox discontinued.  Start IV heparin drip (no bolus) at 800 units/hr Check 6 hour heparin level Daily HL, CBC, INR.    Thank you for allowing pharmacy to be part of this patients care team. Nicole Cella, RPh Clinical Pharmacist Please check AMION for all [place] phone numbers After 10:00 PM, call [place] 01/04/2018,11:06 AM

## 2018-01-04 NOTE — Consult Note (Signed)
Referring Provider: Dr. Nori Riis Primary Care Physician:  Wilber Oliphant, MD Primary Gastroenterologist:  Dr. [name]  Reason for Consultation:  Ascites  HPI: Mario Powell is a 72 y.o. 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 On chronic warfarin for MVR. Denies abdominal pain, jaundice, rectal bleeding. Came in with worsening distention and shortness of breath. Reports that Minimal alcohol use in the past and none in the past 2 years. EGD/colon done last month by Dr. [name] with EGD showing a neuroendocrine tumor on gastric polyps and also showed gastritis.   Past Medical History:  Diagnosis Date  . Anasarca 12/01/2017  . Atrial fibrillation ([place]) 07/12/2013   Perioperative, 2001   . CAD (coronary artery disease) of artery bypass graft 07/12/2013   Saphenous vein graft 2001   . Chronic anticoagulation 07/12/2013  . Essential hypertension 07/12/2013  . History of mitral valve replacement with mechanical valve 01/11/2013   Mechanical mitral valve 2001  Bacterial endocarditis as the cause   . HOH (hard of hearing)   . Prostate cancer ([place]) 07/12/2013    Past Surgical History:  Procedure Laterality Date  . BIOPSY  12/06/2017   Procedure: BIOPSY;  Surgeon: Otis Brace, MD;  Location: [place];  Service: Gastroenterology;;  . COLONOSCOPY WITH PROPOFOL N/A 12/06/2017   Procedure: COLONOSCOPY WITH PROPOFOL ;  Surgeon: Otis Brace, MD;  Location: [place];  Service: Gastroenterology;  Laterality: N/A;  . CORONARY ARTERY BYPASS GRAFT     2001  . ESOPHAGOGASTRODUODENOSCOPY (EGD) WITH PROPOFOL N/A 12/06/2017   Procedure: ESOPHAGOGASTRODUODENOSCOPY (EGD) WITH PROPOFOL;  Surgeon: Otis Brace, MD;  Location: [place];  Service: Gastroenterology;  Laterality: N/A;  . IR PARACENTESIS  12/03/2017  . MITRAL VALVE REPLACEMENT  2001   Mechanical prosthesis  . POLYPECTOMY   12/06/2017   Procedure: POLYPECTOMY;  Surgeon: Otis Brace, MD;  Location: [facility];  Service: Gastroenterology;;    Prior to Admission medications   Medication Sig Start Date End Date Taking? Authorizing Provider  Amino Acids-Protein Hydrolys (FEEDING SUPPLEMENT, PRO-STAT SUGAR FREE 64,) LIQD Take 30 mLs by mouth 3 (three) times daily with meals. 12/09/17  Yes Wilber Oliphant, MD  ezetimibe (ZETIA) 10 MG tablet Take 1 tablet (10 mg total) by mouth daily. 01/11/17  Yes Belva Crome, MD  furosemide (LASIX) 40 MG tablet Take 1 tablet (40 mg total) by mouth every other day. 12/17/17 01/16/18 Yes Wilber Oliphant, MD  medium chain triglycerides (MCT OIL) oil Take 15 mLs by mouth 3 (three) times daily with meals. Patient taking differently: Take 15 mLs by mouth at bedtime.  12/08/17  Yes Wilber Oliphant, MD  Multiple Vitamin (MULTIVITAMIN WITH MINERALS) TABS tablet Take 1 tablet by mouth daily. 12/09/17  Yes Wilber Oliphant, MD  niacin (NIASPAN) 1000 MG CR tablet Take 1 tablet (1,000 mg total) by mouth at bedtime. Patient taking differently: Take 1,000 mg by mouth daily.  01/11/17  Yes Belva Crome, MD  pantoprazole (PROTONIX) 40 MG tablet Take 1 tablet (40 mg total) by mouth 2 (two) times daily. 12/08/17 01/06/18 Yes Wilber Oliphant, MD  rosuvastatin (CRESTOR) 40 MG tablet Take 1 tablet (40 mg total) by mouth daily. 01/11/17  Yes Belva Crome, MD  warfarin (COUMADIN) 1 MG tablet TAKE AS DIRECTED BY ANTICOAGULATION CLINIC Patient taking differently: Take 1 mg by mouth See admin instructions. Take 1 tablet (1.5 MG) by mouth  Monday Wednesday and Friday. Take 1 mg all the other days 07/27/17  Yes Belva Crome, MD    Scheduled Meds: . ezetimibe  10 mg Oral Daily  . furosemide  40 mg Intravenous BID  . pantoprazole  40 mg Oral BID  . rosuvastatin  40 mg Oral Daily   Continuous Infusions: . heparin 800 Units/hr (01/04/18 1212)   PRN Meds:.acetaminophen **OR** acetaminophen, ondansetron **OR**  ondansetron (ZOFRAN) IV, polyethylene glycol  Allergies as of 01/03/2018 - Review Complete 01/03/2018  Allergen Reaction Noted  . Penicillins Other (See Comments) 07/12/2013    Family History  Problem Relation Age of Onset  . Heart attack Father   . Healthy Mother   . Healthy Sister   . Healthy Sister     Social History   Socioeconomic History  . Marital status: Widowed    Spouse name: Not on file  . Number of children: Not on file  . Years of education: Not on file  . Highest education level: Not on file  Occupational History  . Not on file  Social Needs  . Financial resource strain: Not on file  . Food insecurity:    Worry: Not on file    Inability: Not on file  . Transportation needs:    Medical: Not on file    Non-medical: Not on file  Tobacco Use  . Smoking status: Former Research scientist (life sciences)  . Smokeless tobacco: Never Used  . Tobacco comment: QUIT IN 09/1982  Substance and Sexual Activity  . Alcohol use: Not Currently    Comment: NO ALCOHOL SINCE CHRISTMAS 2017  . Drug use: Never  . Sexual activity: Not on file  Lifestyle  . Physical activity:    Days per week: Not on file    Minutes per session: Not on file  . Stress: Not on file  Relationships  . Social connections:    Talks on phone: Not on file    Gets together: Not on file    Attends religious service: Not on file    Active member of club or organization: Not on file    Attends meetings of clubs or organizations: Not on file    Relationship status: Not on file  . Intimate partner violence:    Fear of current or ex partner: Not on file    Emotionally abused: Not on file    Physically abused: Not on file    Forced sexual activity: Not on file  Other Topics Concern  . Not on file  Social History Narrative   Daughters Judson Roch and Sharyn Lull   Works for [employer], mainly at desk all day.    Performs all ADLs    Review of Systems: All negative except as stated above in HPI.  Physical Exam: Vital signs: Vitals:    01/04/18 0638 01/04/18 1425  BP: 101/65 125/73  Pulse: 88 79  Resp: 17 18  Temp: 98.9 F (37.2 C) 98.6 F (37 C)  SpO2: 98% 99%   Last BM Date: 01/03/18 General:   Alert, elderly, thin, no acute distress, hard of hearing Head: normocephalic, atraumatic Eyes: anicteric sclera ENT: oropharynx clear Neck: supple, nontender Lungs:  Clear throughout to auscultation.   No wheezes, crackles, or rhonchi. No acute distress. Heart:  Regular rate and rhythm; no murmurs, clicks, rubs,  or gallops. Abdomen: distention without tenderness, decreased bowel sounds, dull to percussion  Rectal:  Deferred Ext: 3+ pitting LE edema (right greater than left) with chronic skin changes  GI:  Lab Results:  Recent Labs    01/03/18 1114 01/04/18 0343  WBC 5.0 2.9*  HGB 12.2* 9.8*  HCT 37.9* 30.9*  PLT 217 159   BMET Recent Labs    01/03/18 1114 01/04/18 0343  NA 131* 133*  K 4.2 4.0  CL 98 100  CO2 25 26  GLUCOSE 138* 126*  BUN 23 23  CREATININE 1.89* 2.05*  CALCIUM 8.6* 8.3*   LFT Recent Labs    01/03/18 1930 01/04/18 0343  PROT  --  4.6*  ALBUMIN  --  2.2*  AST  --  58*  ALT  --  36  ALKPHOS  --  70  BILITOT 1.0 0.6  BILIDIR 0.2  --   IBILI 0.8  --    PT/INR Recent Labs    01/03/18 1114 01/04/18 1048  LABPROT 25.1* 24.1*  INR 2.32 2.20     Studies/Results: Dg Chest 2 View  Result Date: 01/03/2018 CLINICAL DATA:  Sob for several days EXAM: CHEST - 2 VIEW COMPARISON:  12/01/2017 FINDINGS: Sternotomy wires overlies stable enlarged cardiac silhouette is increasing LEFT pleural effusion. No pulmonary edema. No pneumothorax. No acute osseous abnormality. IMPRESSION: Increasing LEFT unilateral pleural effusion. Electronically Signed   By: Suzy Bouchard M.D.   On: 01/03/2018 11:57   US Abdomen Limited  Result Date: 01/03/2018 CLINICAL DATA:  Ascites EXAM: LIMITED ABDOMEN ULTRASOUND FOR ASCITES TECHNIQUE: Limited ultrasound survey for ascites was performed in all four  abdominal quadrants. COMPARISON:  CT scan 12/04/2017 FINDINGS: Survey of all 4 quadrants demonstrates a moderate to prominent amount of ascites. Depth of ascites pocket up to 5.0 cm in the right upper quadrant, 10.2 cm in the right lower quadrant, 3.0 cm in the left upper quadrant, and 8.0 cm in the left lower quadrant. A left pleural effusion is also observed. IMPRESSION: 1. Moderate to prominent ascites. 2. Left pleural effusion. Electronically Signed   By: Van Clines M.D.   On: 01/03/2018 17:15    Impression/Plan: Anasarca with recurrent chylous ascites in need of a repeat paracentesis for therapeutic reasons as well as diagnostic reasons. Malignancy as source of ascites is my main concern vs cirrhosis. Send fluid for cytology and additional labs. IV Albumin ordered. Supportive care.    LOS: 1 day   Lear Ng  01/04/2018, 2:29 PM  Questions please call [phone]

## 2018-01-04 NOTE — Progress Notes (Signed)
ANTICOAGULATION CONSULT NOTE - f/u consult  Pharmacy Consult for warfarin and heparin Indication: VTE prophylaxis due to prosthetic mitral heart valve.  Allergies  Allergen Reactions  . Penicillins Other (See Comments)    REACTION: UNKNOWN    Patient Measurements: Height: 5\' 11"  (180.3 cm) Weight: 190 lb (86.2 kg) IBW/kg (Calculated) : 75.3  Heparin dosing weight: 86.2 kg  Vital Signs: Temp: 98.6 F (37 C) (10/22 1425) Temp Source: (P) Oral (10/22 1425) BP: 125/73 (10/22 1425) Pulse Rate: 79 (10/22 1425)  Labs: Recent Labs    01/03/18 1114 01/04/18 0343 01/04/18 1048  HGB 12.2* 9.8*  --   HCT 37.9* 30.9*  --   PLT 217 159  --   LABPROT 25.1*  --  24.1*  INR 2.32  --  2.20  CREATININE 1.89* 2.05*  --     Estimated Creatinine Clearance: 33.7 mL/min (A) (by C-G formula based on SCr of 2.05 mg/dL (H)).   Medical History: Past Medical History:  Diagnosis Date  . Anasarca 12/01/2017  . Atrial fibrillation ([place]) 07/12/2013   Perioperative, 2001   . CAD (coronary artery disease) of artery bypass graft 07/12/2013   Saphenous vein graft 2001   . Chronic anticoagulation 07/12/2013  . Essential hypertension 07/12/2013  . History of mitral valve replacement with mechanical valve 01/11/2013   Mechanical mitral valve 2001  Bacterial endocarditis as the cause   . HOH (hard of hearing)   . Prostate cancer ([place]) 07/12/2013     Assessment: Pt is a 66 YOM presented 01/03/18 with increased SOB and fluid retention in  abdomen and bilateral legs. Pt has h/o paracentesis with 13 lb weight gain since procedure in September. Pt on chronic warfarin due to mitral valve replacement with INR goal of 2.5-3.5. Warfarin PTA dose: warfarin 1.5mg  Mon/Wed/Fri, warfarin 1mg  Sun/Tue/Thurs/Sat; pt reports last dose 01/02/18 at 0700.  INR 2.32 on admit, subtherapeutic.  Thus Lovenox bridge ordered 10/21 but Lovenox dose & warfarin dose was held last night per MD order.  Patient is s/p paracentesis,   Patient is to restart warfarin per MD and heparin is to be continued until INR is consistently above 2.5. Heparin level this PM <0.1, will increase drip.   Goal of Therapy:  INR 2.5-3.5  Heparin level 0.3-0.7 Monitor platelets by anticoagulation protocol: Yes   Plan:  Warfarin 2mg  PO x 1 tonight Increase heparin drip to 1100 units/hr Check 8 hour heparin level Daily HL, CBC, INR.   Markise Haymer A. Levada Dy, PharmD, [place] Pager: [phone] Please utilize Amion for appropriate phone number to reach the unit pharmacist ([place])   01/04/2018,7:11 PM

## 2018-01-04 NOTE — Telephone Encounter (Signed)
Patient was admitted to [facility] service on 01/03/2018. Will see patient at outpatient follow up at discharge.

## 2018-01-04 NOTE — Progress Notes (Signed)
Family Medicine Teaching Service Daily Progress Note Intern Pager: [phone]  Patient name: Mario Powell Medical record number: [id number] Date of birth: Aug 07, 1945 Age: 72 y.o. Gender: male  Primary Care Provider: Wilber Oliphant, MD Consultants: GI Code Status: full  Pt Overview and Major Events to Date:  10/21 admitted  Assessment and Plan:  Anasarca- patient has continued diffuse swelling especially lower extremities, abdomen, and left arm which is improved slightly from yesterday. Has had good urine output ~700cc yesterday afternoon. Re-consulted GI to assess if paracentesis is indicated at this time. Abdomen US showed moderate to prominent ascites, and left pleural effusion. - vital signs per floor routine - f/u GI consult- previously seen by Dr. [name] [facility]) - continue Lasix IV 40mg  x2 - strict I/Os, daily weights  Acute on chronic kidney injury Patient has chronic kidney disease stage III. GFR 33 on admission and Creatinine 1.89 on admission. Today, Cr 2.05.  - daily bmp - lasix 40mg  IV x2 as above - avoid nephrotoxic agents - home hctz and ramipril held at dc, continue to hold  Chronic Hyponatremia Likely secondary to fluid overload. Na+ on admission was 131. Today is 133. Patient is asymptomatic.  - BMP am - continue to monitor for signs of hyponatremia - add NS if becomes symptomatic (HA, N/V, confusion, etc) - fluid restriction  Mechanical mitral valve/ Anticoagulation audible mechanical click on physical exam heard best on left sternal border. Patient was subtherapeutic on coumadin at admission INR 2.32. Heparin drip today in anticipation of paracentesis today or tomorrow so will need to recheck INR prior to procedure. INR 2.2 today. Consulting pharmacy for warfarin dosing post procedure.  - monitor on physical exam - continuous cardiac monitoring - heparin drip - warfarin dosing per pharm recs  Macrocytic anemia B12 89 and MMA 3401. Sees heme/onc for this  and h/o prostate cancer. Gets chronic B12 supplementation per notes in the chart from Dr. Burr Medico of hematology. 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 Was to follow with GI clinically.  - outpatient referral to heme/onc from last admission - continue to monitor for signs of exacerbation - f/u with patient's heme/onc physician  Essential hypertension poorly controlled on this admission. Systolic 324-401, diastolic 02-725 - continue to diurese as above - vital signs per floor protocol   Paroxysmal atrial fibrillation- home dose warfarin 1.5mg  M,W,F and 1mg  all other days. INR is 2.32 on admission which subtherapeutic for mechanical valve. Will consult pharmacy for dosing recommendations. Bridge to therapeutic warfarin level with lovenox. - heperin drip - appreciate pharmacy recs  Pancytopenia- Workup outpatient per heme/onc. retic count, iron studies, folic acid, D66, MMA, SPEP with immunofixation, light chain levels, LDH, haptoglobin ordered. - outpatient referral to heme/onc from last admission - CBC am  H/O Prostate cancer S/P resection back in 2008. PSA 0.18 on 09/23 with no new symptoms. Sees urology yearly.  Hyperlipidemia Last cholesterol panel 12/2014, chol 132, HDL 36, LDL 71 - continue home rosuvastatin 40mg  - continue home ezetimibe 10mg  - repeat lipid panel - holding home niacin  GERD - continue home protonix   FEN/GI: heart healthy diet Prophylaxis: lovenox bridge to therapeutic coumadin level  Disposition: discharge pending GI assessment  Subjective:  Overnight: no acute events  Today: patient feels fine. He states that his swelling is mildly improved today since yesterday  Objective: Temp:  [97.5 F (36.4 C)-98.9 F (  37.2 C)] 98.9 F (37.2 C) (10/22 3559) Pulse Rate:  [71-96] 88 (10/22 0638) Resp:  [16-27] 17 (10/22  7416) BP: (101-191)/(65-105) 101/65 (10/22 0638) SpO2:  [96 %-100 %] 98 % (10/22 3845) Weight:  [86.2 kg-88.8 kg] 86.2 kg (10/22 3646) Physical Exam: General: well-appearing Cardiovascular: RRR, mechanical click Respiratory: CTAB Abdomen: distended, positive fluid wave Extremities: lower extremities severe 3+ pitting edema bilaterally slightly improved from yesterday  Laboratory: Recent Labs  Lab 01/03/18 1114 01/04/18 0343  WBC 5.0 2.9*  HGB 12.2* 9.8*  HCT 37.9* 30.9*  PLT 217 159   Recent Labs  Lab 01/03/18 1114 01/03/18 1930 01/04/18 0343  NA 131*  --  133*  K 4.2  --  4.0  CL 98  --  100  CO2 25  --  26  BUN 23  --  23  CREATININE 1.89*  --  2.05*  CALCIUM 8.6*  --  8.3*  PROT  --   --  4.6*  BILITOT  --  1.0 0.6  ALKPHOS  --   --  70  ALT  --   --  36  AST  --   --  58*  GLUCOSE 138*  --  126*    Imaging/Diagnostic Tests: Dg Chest 2 View  Result Date: 01/03/2018 CLINICAL DATA:  Sob for several days EXAM: CHEST - 2 VIEW COMPARISON:  12/01/2017 FINDINGS: Sternotomy wires overlies stable enlarged cardiac silhouette is increasing LEFT pleural effusion. No pulmonary edema. No pneumothorax. No acute osseous abnormality. IMPRESSION: Increasing LEFT unilateral pleural effusion. Electronically Signed   By: Suzy Bouchard M.D.   On: 01/03/2018 11:57   US Abdomen Limited  Result Date: 01/03/2018 CLINICAL DATA:  Ascites EXAM: LIMITED ABDOMEN ULTRASOUND FOR ASCITES TECHNIQUE: Limited ultrasound survey for ascites was performed in all four abdominal quadrants. COMPARISON:  CT scan 12/04/2017 FINDINGS: Survey of all 4 quadrants demonstrates a moderate to prominent amount of ascites. Depth of ascites pocket up to 5.0 cm in the right upper quadrant, 10.2 cm in the right lower quadrant, 3.0 cm in the left upper quadrant, and 8.0 cm in the left lower quadrant. A left pleural effusion is also observed. IMPRESSION: 1. Moderate to prominent ascites. 2. Left pleural effusion.  Electronically Signed   By: Van Clines M.D.   On: 01/03/2018 17:15   Richarda Osmond, DO 01/04/2018, 9:22 AM PGY-1, [place] Intern pager: [phone], text pages welcome

## 2018-01-05 ENCOUNTER — Inpatient Hospital Stay (HOSPITAL_COMMUNITY): Payer: Medicare Other

## 2018-01-05 DIAGNOSIS — D61818 Other pancytopenia: Secondary | ICD-10-CM

## 2018-01-05 DIAGNOSIS — I2510 Atherosclerotic heart disease of native coronary artery without angina pectoris: Secondary | ICD-10-CM

## 2018-01-05 DIAGNOSIS — Z952 Presence of prosthetic heart valve: Secondary | ICD-10-CM

## 2018-01-05 DIAGNOSIS — Z7901 Long term (current) use of anticoagulants: Secondary | ICD-10-CM

## 2018-01-05 DIAGNOSIS — N3020 Other chronic cystitis without hematuria: Secondary | ICD-10-CM

## 2018-01-05 DIAGNOSIS — D472 Monoclonal gammopathy: Secondary | ICD-10-CM

## 2018-01-05 DIAGNOSIS — I4891 Unspecified atrial fibrillation: Secondary | ICD-10-CM

## 2018-01-05 DIAGNOSIS — E538 Deficiency of other specified B group vitamins: Secondary | ICD-10-CM

## 2018-01-05 DIAGNOSIS — N189 Chronic kidney disease, unspecified: Secondary | ICD-10-CM

## 2018-01-05 LAB — CBC
HCT: 29 % — ABNORMAL LOW (ref 39.0–52.0)
HEMOGLOBIN: 9.8 g/dL — AB (ref 13.0–17.0)
MCH: 33.4 pg (ref 26.0–34.0)
MCHC: 33.8 g/dL (ref 30.0–36.0)
MCV: 99 fL (ref 80.0–100.0)
NRBC: 0 % (ref 0.0–0.2)
Platelets: 129 10*3/uL — ABNORMAL LOW (ref 150–400)
RBC: 2.93 MIL/uL — AB (ref 4.22–5.81)
RDW: 13.4 % (ref 11.5–15.5)
WBC: 2.7 10*3/uL — AB (ref 4.0–10.5)

## 2018-01-05 LAB — BASIC METABOLIC PANEL
ANION GAP: 8 (ref 5–15)
BUN: 25 mg/dL — ABNORMAL HIGH (ref 8–23)
CHLORIDE: 100 mmol/L (ref 98–111)
CO2: 26 mmol/L (ref 22–32)
Calcium: 8 mg/dL — ABNORMAL LOW (ref 8.9–10.3)
Creatinine, Ser: 2.1 mg/dL — ABNORMAL HIGH (ref 0.61–1.24)
GFR calc non Af Amer: 29 mL/min — ABNORMAL LOW (ref >60)
GFR, EST AFRICAN AMERICAN: 34 mL/min — AB (ref >60)
Glucose, Bld: 123 mg/dL — ABNORMAL HIGH (ref 70–99)
POTASSIUM: 3.2 mmol/L — AB (ref 3.5–5.1)
SODIUM: 134 mmol/L — AB (ref 135–145)

## 2018-01-05 LAB — PROTIME-INR
INR: 2.26
PROTHROMBIN TIME: 24.6 s — AB (ref 11.4–15.2)

## 2018-01-05 LAB — HEPARIN LEVEL (UNFRACTIONATED)
Heparin Unfractionated: 0.29 IU/mL — ABNORMAL LOW (ref 0.30–0.70)
Heparin Unfractionated: 0.46 IU/mL (ref 0.30–0.70)
Heparin Unfractionated: 0.52 IU/mL (ref 0.30–0.70)

## 2018-01-05 LAB — PATHOLOGIST SMEAR REVIEW

## 2018-01-05 LAB — TRIGLYCERIDES, BODY FLUIDS: Triglycerides, Fluid: 521 mg/dL

## 2018-01-05 MED ORDER — POTASSIUM CHLORIDE CRYS ER 20 MEQ PO TBCR
40.0000 meq | EXTENDED_RELEASE_TABLET | Freq: Two times a day (BID) | ORAL | Status: DC
  Administered 2018-01-05 – 2018-01-06 (×4): 40 meq via ORAL
  Filled 2018-01-05 (×5): qty 2

## 2018-01-05 MED ORDER — WARFARIN SODIUM 1 MG PO TABS
1.5000 mg | ORAL_TABLET | Freq: Once | ORAL | Status: AC
  Administered 2018-01-05: 1.5 mg via ORAL
  Filled 2018-01-05: qty 1

## 2018-01-05 NOTE — Progress Notes (Signed)
Family medicine progress update  Discussed case with on-call radiologist.  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 Dr. Colin Ina explained that CT chest, abdomen, pelvis would be best modality for screening patient, especially for malignancy. MRI would be most useful for evaluate an already known mass, or to screen for liver metastasis.  Given patient's GFR being less than 30 he recommended no IV contrast be given. Will follow phone recommendations of ct chest, abdomen, pelvis with oral contrast to screen for malignant etiology of chylous ascites.  Guadalupe Dawn MD PGY-2 Family Medicine Resident

## 2018-01-05 NOTE — Progress Notes (Signed)
ANTICOAGULATION CONSULT NOTE - f/u consult  Pharmacy Consult for warfarin and heparin Indication: VTE prophylaxis due to prosthetic mitral heart valve.  Allergies  Allergen Reactions  . Penicillins Other (See Comments)    REACTION: UNKNOWN    Patient Measurements: Height: 5\' 11"  (180.3 cm) Weight: 182 lb 1.6 oz (82.6 kg) IBW/kg (Calculated) : 75.3  Heparin dosing weight: 86.2 kg  Vital Signs:    Labs: Recent Labs    01/03/18 1114 01/04/18 0343 01/04/18 1048  01/05/18 0419 01/05/18 0420 01/05/18 1206 01/05/18 1948  HGB 12.2* 9.8*  --   --  9.8*  --   --   --   HCT 37.9* 30.9*  --   --  29.0*  --   --   --   PLT 217 159  --   --  129*  --   --   --   LABPROT 25.1*  --  24.1*  --   --  24.6*  --   --   INR 2.32  --  2.20  --   --  2.26  --   --   HEPARINUNFRC  --   --   --    < >  --  0.29* 0.46 0.52  CREATININE 1.89* 2.05*  --   --  2.10*  --   --   --    < > = values in this interval not displayed.    Estimated Creatinine Clearance: 32.9 mL/min (A) (by C-G formula based on SCr of 2.1 mg/dL (H)).   Medical History: Past Medical History:  Diagnosis Date  . Anasarca 12/01/2017  . Atrial fibrillation ([place]) 07/12/2013   Perioperative, 2001   . CAD (coronary artery disease) of artery bypass graft 07/12/2013   Saphenous vein graft 2001   . Chronic anticoagulation 07/12/2013  . Essential hypertension 07/12/2013  . History of mitral valve replacement with mechanical valve 01/11/2013   Mechanical mitral valve 2001  Bacterial endocarditis as the cause   . HOH (hard of hearing)   . Prostate cancer ([place]) 07/12/2013     Assessment: Pt is a 27 YOM on chronic warfarin due to mitral valve replacement. Patient is s/p paracentesis, on heparin bridge to therapeutic INR (2.26 this morning).  Heparin level 0.52, remains therapeutic on 1250 units/hr  Goal of Therapy:  INR 2.5-3.5  Heparin level 0.3-0.7 Monitor platelets by anticoagulation protocol: Yes   Plan:  Continue  heparin at current rate F/u AM labs,   Maryanna Shape, PharmD, BCPS, BCPPS Clinical Pharmacist  Pager: [phone]     01/05/2018,8:53 PM

## 2018-01-05 NOTE — Progress Notes (Signed)
[facility] Gastroenterology Progress Note  Mario Powell 72 y.o. Mar 05, 1946   Subjective: Denies abdominal pain. Feels ok. Tolerating solid food.  Objective: Vital signs: Vitals:   01/04/18 2124 01/05/18 0519  BP: 131/72 (!) 103/54  Pulse: 74 74  Resp: 16 (!) 24  Temp: 97.7 F (36.5 C) 98.4 F (36.9 C)  SpO2: 99% 95%    Physical Exam: Gen: alert, no acute distress, elderly, thin  HEENT: anicteric sclera CV: RRR Chest: CTA B Abd: less distended, nontender, +BS Ext: 3+ LE pitting edema  Lab Results: Recent Labs    01/04/18 0343 01/05/18 0419  NA 133* 134*  K 4.0 3.2*  CL 100 100  CO2 26 26  GLUCOSE 126* 123*  BUN 23 25*  CREATININE 2.05* 2.10*  CALCIUM 8.3* 8.0*   Recent Labs    01/03/18 1930 01/04/18 0343  AST  --  58*  ALT  --  36  ALKPHOS  --  70  BILITOT 1.0 0.6  PROT  --  4.6*  ALBUMIN  --  2.2*   Recent Labs    01/04/18 0343 01/05/18 0419  WBC 2.9* 2.7*  NEUTROABS 2.1  --   HGB 9.8* 9.8*  HCT 30.9* 29.0*  MCV 101.0* 99.0  PLT 159 129*      Assessment/Plan: 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 Ascitic TG, lipase pending. Ascitic amylase and glucose noted. Ascitic fluid not consistent with infection. Await cytology to check for malignancy but that can be f/u as an outpt with Dr. Alessandra Powell. Check AFP. Ok to go home from GI standpoint when anticoagulation in therapeutic range and f/u closely with Dr. Alessandra Powell from [place].   Mario Powell 01/05/2018, 10:16 AM  Questions please call [phone] ID: Mario Powell, male   DOB: [birth year], 72 y.o.   MRN: [id number]

## 2018-01-05 NOTE — Progress Notes (Signed)
ANTICOAGULATION CONSULT NOTE - f/u consult  Pharmacy Consult for warfarin and heparin Indication: VTE prophylaxis due to prosthetic mitral heart valve.  Allergies  Allergen Reactions  . Penicillins Other (See Comments)    REACTION: UNKNOWN    Patient Measurements: Height: 5\' 11"  (180.3 cm) Weight: 182 lb 1.6 oz (82.6 kg) IBW/kg (Calculated) : 75.3  Heparin dosing weight: 86.2 kg  Vital Signs: Temp: 98.4 F (36.9 C) (10/23 0519) Temp Source: Oral (10/23 0519) BP: 103/54 (10/23 0519) Pulse Rate: 74 (10/23 0519)  Labs: Recent Labs    01/03/18 1114 01/04/18 0343 01/04/18 1048 01/04/18 1918 01/05/18 0419 01/05/18 0420 01/05/18 1206  HGB 12.2* 9.8*  --   --  9.8*  --   --   HCT 37.9* 30.9*  --   --  29.0*  --   --   PLT 217 159  --   --  129*  --   --   LABPROT 25.1*  --  24.1*  --   --  24.6*  --   INR 2.32  --  2.20  --   --  2.26  --   HEPARINUNFRC  --   --   --  <0.10*  --  0.29* 0.46  CREATININE 1.89* 2.05*  --   --  2.10*  --   --     Estimated Creatinine Clearance: 32.9 mL/min (A) (by C-G formula based on SCr of 2.1 mg/dL (H)).   Medical History: Past Medical History:  Diagnosis Date  . Anasarca 12/01/2017  . Atrial fibrillation ([place]) 07/12/2013   Perioperative, 2001   . CAD (coronary artery disease) of artery bypass graft 07/12/2013   Saphenous vein graft 2001   . Chronic anticoagulation 07/12/2013  . Essential hypertension 07/12/2013  . History of mitral valve replacement with mechanical valve 01/11/2013   Mechanical mitral valve 2001  Bacterial endocarditis as the cause   . HOH (hard of hearing)   . Prostate cancer ([place]) 07/12/2013     Assessment: Pt is a 31 YOM presented 01/03/18 with increased SOB and fluid retention in  abdomen and bilateral legs. Pt has h/o paracentesis with 13 lb weight gain since procedure in September. Pt on chronic warfarin due to mitral valve replacement with INR goal of 2.5-3.5. Warfarin PTA dose: warfarin 1.5mg  Mon/Wed/Fri,  warfarin 1mg  Sun/Tue/Thurs/Sat; pt reports last dose 01/02/18 at 0700.  INR 2.32 on admit, subtherapeutic.   Patient is s/p paracentesis,  Patient is to restart warfarin per MD and heparin is to be continued until INR is consistently above 2.5. INR is 2.26 and Heparin level is 0.46 this afternoon.    Goal of Therapy:  INR 2.5-3.5  Heparin level 0.3-0.7 Monitor platelets by anticoagulation protocol: Yes   Plan:  Warfarin  1.5 mg PO x 1 tonight per home dose Continue  heparin drip at 1250 units/hr Check 8 hour heparin level Daily HL, CBC, INR.   Codey Burling A. Levada Dy, PharmD, [place] Pager: [phone] Please utilize Amion for appropriate phone number to reach the unit pharmacist ([place])   01/05/2018,1:55 PM

## 2018-01-05 NOTE — Progress Notes (Addendum)
[place] for Heparin Indication: MVR  Allergies  Allergen Reactions  . Penicillins Other (See Comments)    REACTION: UNKNOWN    Patient Measurements: Height: 5\' 11"  (180.3 cm) Weight: 182 lb 1.6 oz (82.6 kg) IBW/kg (Calculated) : 75.3  Heparin dosing weight: 86.2 kg  Vital Signs: Temp: 98.4 F (36.9 C) (10/23 0519) Temp Source: Oral (10/23 0519) BP: 103/54 (10/23 0519) Pulse Rate: 74 (10/23 0519)  Labs: Recent Labs    01/03/18 1114 01/04/18 0343 01/04/18 1048 01/04/18 1918 01/05/18 0419 01/05/18 0420  HGB 12.2* 9.8*  --   --  9.8*  --   HCT 37.9* 30.9*  --   --  29.0*  --   PLT 217 159  --   --  129*  --   LABPROT 25.1*  --  24.1*  --   --  24.6*  INR 2.32  --  2.20  --   --  2.26  HEPARINUNFRC  --   --   --  <0.10*  --  0.29*  CREATININE 1.89* 2.05*  --   --   --   --     Estimated Creatinine Clearance: 33.7 mL/min (A) (by C-G formula based on SCr of 2.05 mg/dL (H)).   Medical History: Past Medical History:  Diagnosis Date  . Anasarca 12/01/2017  . Atrial fibrillation ([place]) 07/12/2013   Perioperative, 2001   . CAD (coronary artery disease) of artery bypass graft 07/12/2013   Saphenous vein graft 2001   . Chronic anticoagulation 07/12/2013  . Essential hypertension 07/12/2013  . History of mitral valve replacement with mechanical valve 01/11/2013   Mechanical mitral valve 2001  Bacterial endocarditis as the cause   . HOH (hard of hearing)   . Prostate cancer ([place]) 07/12/2013     Assessment: Pt is a 16 YOM presented 01/03/18 with increased SOB and fluid retention in  abdomen and bilateral legs. Pt has h/o paracentesis with 13 lb weight gain since procedure in September. Pt on chronic warfarin due to mitral valve replacement with INR goal of 2.5-3.5. Warfarin PTA dose: warfarin 1.5mg  Mon/Wed/Fri, warfarin 1mg  Sun/Tue/Thurs/Sat; pt reports last dose 01/02/18 at 0700.  INR 2.32 on admit, subtherapeutic.  Thus Lovenox bridge  ordered 10/21 but Lovenox dose & warfarin dose was held last night per MD order.  Patient is s/p paracentesis,  Patient is to restart warfarin per MD and heparin is to be continued until INR is consistently above 2.5. Heparin level this PM <0.1, will increase drip.   10/23 AM update: heparin level just below goal this AM, no issues per RN   Goal of Therapy:  INR 2.5-3.5  Heparin level 0.3-0.7 units/mL Monitor platelets by anticoagulation protocol: Yes   Plan:  Inc heparin to 1250 units/hr 1400 HL Daily HL, CBC, INR.   Narda Bonds, PharmD, BCPS Clinical Pharmacist Phone: [phone]

## 2018-01-05 NOTE — Progress Notes (Addendum)
BRANDIN STETZER   DOB:[birth year]   WE#:[id number]   YPP#:[id number]  Hematology and Oncology follow up   Subjective: I saw Mr. Bueno in my office on 12/23/2017 for his related to diagnosed gastric neuroendocrine tumor and pancytopenia.  He was admitted yesterday for recurrent cystitis, and worsening bilateral lower extremities edema.  He underwent paracentesis yesterday with 4.5 L fluids removed, he feels better today. I spoke with pt and two of his daughters at bedside.    Objective:  Vitals:   01/04/18 2124 01/05/18 0519  BP: 131/72 (!) 103/54  Pulse: 74 74  Resp: 16 (!) 24  Temp: 97.7 F (36.5 C) 98.4 F (36.9 C)  SpO2: 99% 95%    Body mass index is 25.4 kg/m.  Intake/Output Summary (Last 24 hours) at 01/05/2018 2050 Last data filed at 01/05/2018 0500 Gross per 24 hour  Intake 56.65 ml  Output 1310 ml  Net -1253.35 ml     Sclerae unicteric  Oropharynx clear  No peripheral adenopathy  Lungs clear with decreased breath sound at lung base   Heart regular rate and rhythm  Abdomen benign, mildly distended   MSK no focal spinal tenderness, (+) significant edema of the bilateral feet and entire lower extremity   CBG (last 3)  No results for input(s): GLUCAP in the last 72 hours.   Labs:  Lab Results  Component Value Date   WBC 2.7 (L) 01/05/2018   HGB 9.8 (L) 01/05/2018   HCT 29.0 (L) 01/05/2018   MCV 99.0 01/05/2018   PLT 129 (L) 01/05/2018   NEUTROABS 2.1 01/04/2018   CMP Latest Ref Rng & Units 01/05/2018 01/04/2018 01/03/2018  Glucose 70 - 99 mg/dL 123(H) 126(H) 138(H)  BUN 8 - 23 mg/dL 25(H) 23 23  Creatinine 0.61 - 1.24 mg/dL 2.10(H) 2.05(H) 1.89(H)  Sodium 135 - 145 mmol/L 134(L) 133(L) 131(L)  Potassium 3.5 - 5.1 mmol/L 3.2(L) 4.0 4.2  Chloride 98 - 111 mmol/L 100 100 98  CO2 22 - 32 mmol/L _0 Calcium 8.9 - 10.3 mg/dL 8.0(L) 8.3(L) 8.6(L)  Total Protein 6.5 - 8.1 g/dL - 4.6(L) -  Total Bilirubin 0.3 - 1.2 mg/dL - 0.6 1.0  Alkaline Phos 38 - 126 U/L -  70 -  AST 15 - 41 U/L - 58(H) -  ALT 0 - 44 U/L - 36 -     Urine Studies No results for input(s): UHGB, CRYS in the last 72 hours.  Invalid input(s): UACOL, UAPR, USPG, UPH, UTP, UGL, UKET, UBIL, UNIT, UROB, ULEU, UEPI, UWBC, URBC, [place], CAST, [place], [place]  Basic Metabolic Panel: Recent Labs  Lab 01/03/18 1114 01/04/18 0343 01/05/18 0419  NA 131* 133* 134*  K 4.2 4.0 3.2*  CL 98 100 100  CO2 _1 GLUCOSE 138* 126* 123*  BUN 23 23 25*  CREATININE 1.89* 2.05* 2.10*  CALCIUM 8.6* 8.3* 8.0*   GFR Estimated Creatinine Clearance: 32.9 mL/min (A) (by C-G formula based on SCr of 2.1 mg/dL (H)). Liver Function Tests: Recent Labs  Lab 01/03/18 1930 01/04/18 0343  AST  --  58*  ALT  --  36  ALKPHOS  --  70  BILITOT 1.0 0.6  PROT  --  4.6*  ALBUMIN  --  2.2*   No results for input(s): LIPASE, AMYLASE in the last 168 hours. No results for input(s): AMMONIA in the last 168 hours. Coagulation profile Recent Labs  Lab 12/31/17 1313 01/03/18 1114 01/04/18 1048 01/05/18 0420  INR 2.0  2.32 2.20 2.26    CBC: Recent Labs  Lab 01/03/18 1114 01/04/18 0343 01/05/18 0419  WBC 5.0 2.9* 2.7*  NEUTROABS  --  2.1  --   HGB 12.2* 9.8* 9.8*  HCT 37.9* 30.9* 29.0*  MCV 102.7* 101.0* 99.0  PLT 217 159 129*   Cardiac Enzymes: No results for input(s): CKTOTAL, CKMB, CKMBINDEX, TROPONINI in the last 168 hours. BNP: Invalid input(s): POCBNP CBG: No results for input(s): GLUCAP in the last 168 hours. D-Dimer No results for input(s): DDIMER in the last 72 hours. Hgb A1c No results for input(s): HGBA1C in the last 72 hours. Lipid Profile Recent Labs    01/04/18 0343  CHOL 102  HDL 27*  LDLCALC 65  TRIG 51  CHOLHDL 3.8   Thyroid function studies No results for input(s): TSH, T4TOTAL, T3FREE, THYROIDAB in the last 72 hours.  Invalid input(s): FREET3 Anemia work up No results for input(s): VITAMINB12, FOLATE, FERRITIN, TIBC, IRON, RETICCTPCT in the last 72  hours. Microbiology Recent Results (from the past 240 hour(s))  Culture, body fluid-bottle     Status: None (Preliminary result)   Collection Time: 01/04/18  3:34 PM  Result Value Ref Range Status   Specimen Description PERITONEAL  Final   Special Requests BOTTLES DRAWN AEROBIC AND ANAEROBIC  Final   Culture   Final    NO GROWTH < 24 HOURS Performed at [facility], [place] [address], [place], [place] [address]    Report Status PENDING  Incomplete  Gram stain     Status: None   Collection Time: 01/04/18  3:34 PM  Result Value Ref Range Status   Specimen Description PERITONEAL  Final   Special Requests NONE  Final   Gram Stain   Final    FEW WBC PRESENT, PREDOMINANTLY PMN NO ORGANISMS SEEN Performed at [facility], [place] [address], [place], [place] [address]    Report Status 01/04/2018 FINAL  Final      Studies:  Ir Paracentesis  Result Date: 01/04/2018 INDICATION: Patient with history of abdominal distention and recurrent ascites. Request is made for diagnostic and therapeutic paracentesis. Removal limit to 4 L today due to renal insufficiency. EXAM: ULTRASOUND GUIDED DIAGNOSTIC AND THERAPEUTIC PARACENTESIS MEDICATIONS: 10 mL of 1% lidocaine COMPLICATIONS: None immediate. PROCEDURE: Informed written consent was obtained from the patient after a discussion of the risks, benefits and alternatives to treatment. A timeout was performed prior to the initiation of the procedure. Initial ultrasound scanning demonstrates a large amount of ascites within the right lower abdominal quadrant. The right lower abdomen was prepped and draped in the usual sterile fashion. 1% lidocaine was used for local anesthesia. Following this, a 19 gauge, 7-cm, Yueh catheter was introduced. An ultrasound image was saved for documentation purposes. The paracentesis was performed. The catheter was removed and a dressing was applied. The patient tolerated the procedure well without immediate post procedural  complication. FINDINGS: A total of approximately 4 L of light yellow, milky thick fluid was removed. Samples were sent to the laboratory as requested by the clinical team. IMPRESSION: Successful ultrasound-guided paracentesis yielding 4 L of peritoneal fluid. Read by: Earley Abide, PA-C Electronically Signed   By: Lucrezia Europe M.D.   On: 01/04/2018 15:41    Assessment: 72 y.o.  With PMH of hypertension, coronary artery disease, mitral valve replacement with mechanical valve, atrial fibrillation, chronic kidney disease, lymphedema, recently diagnosed of gastric neuroendocrine tumor, presented with recurrent ascites, moderate left pleural effusion.  1. Recurrent  chylous ascites, chronic pancreatitis?  2. Left pleural effusion  3. 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 Pancytopenia, B12 deficiency  5. CAD, AF, MV replacement on coumadin  6. Acute on chronic kidney disease 7. Paraprotein, likley MGUS    Recommendations -his ascites SAAG was 0.6g/dl, so this is not related to liver cirrhosis or right side heart failure, based on ascite cell count, this is unlikely infectious. Possible etiology for chylous ascites are malignancy, nephrotic syndrome, pancreatic ascites.  -His amylse was 104 (>40 consider elevated) in ascites, which supports pancreatic ascites, although his CT abd last month showed no signs of pancreatitis and pt does not have abdominal pain  -will check serum lipase and amylase tomorrow morning  -Given his serum alb 2.2, anasaca, and new onset but unknown duration of renal failure (Cr 1.17 in 12/2016, increased to 3.12 on 12/01/2017, around 2 lately), I would check 24h urine to rule out nephrotic syndrome caused chylous ascites, although his UA was negative for protein on 12/01/2017 makes it less likely -please add adenosine deaminase to his ascites, to rule put tuberculosis, and culture for tuberculosis if possible.  -during his work up  for his pancytopenia, I did SPEP which showed low level M-protein (0.1) and elevated both light chain levels, I doubt he has multiple myeloma, but given the unknow etiology of his ascites, severe hypoalbuminemia, it's reasonable to rule it out. I will see if his CT scan shows any lytic bone lesion, if CT scan negative for malignancy, I would consider a bone marrow biopsy. Due to pt's need for anticoagulation for mechanical valve, I prefer his BM biopsy done during his hospital stay, hopefully by Friday, I will consult IR -his recent CT abdomen and EGD/colonoscopy one month ago was negative for malignancy in abdomen, except small gastric neuroendocrine tumor which were resected and no evidence of metastasis -ascites cytology is pending  -OK to discharge home after the above workup , from my hem/onc standpoint, I will f/u with him in my clinic   Plan -I will order 24hr urine for total protein, and UPEP with IFE and light chain level, I also ordered serum lipase and amylase for tomorrow morning  -IR consult for bone marrow biopsy on Friday ordered  -please call lab to see if they still can add  adenosine deaminase and tuberculosis culture on ascites from yesterday     Yan Feng, MD 01/05/2018  8:50 PM  

## 2018-01-05 NOTE — Progress Notes (Addendum)
Family Medicine Teaching Service Daily Progress Note Intern Pager: [phone]  Patient name: Mario Powell Medical record number: [id number] Date of birth: 12-May-1945 Age: 72 y.o. Gender: male  Primary Care Provider: Wilber Oliphant, MD Consultants: GI Code Status: full  Pt Overview and Major Events to Date:  10/21 admitted to fpts 10/22 paracentesis  Assessment and Plan:  Anasarca Improved after 4L taken off from paracentesis 10/22. Continues to diurese well. Net 1L from urine output. Pitting edema above knees has greatly improved. Paracentesis labs that have resulted thus far are unrevealing. Body fluid culture with no growth at <24 hours. SAAG <1.1 no consistent with cirrhosis.  Gastroenterology following, appreciate their recs. Will get CEA, AFP. Will also get MRI chest, abdomen, pelvis to look for underlying malignancy. Will continue diuresis for now. There is some question over whether this could be secondary to light chain deposition disease. Outpatient Heme/onc to evaluate. - vital signs per floor routine - GI following, appreciate recs - vital signs per floor routine  - continue Lasix IV 40mg  x2, likely decreased 10/24 - strict I/Os, daily weights - MRI chest, abdomen, pelvis   Acute on chronic kidney disease stage III Patient has chronic kidney disease stage III. GFR 33 on admission and Creatinine 1.89 on admission. 2.1 this am, stable from 2.1 on 10/22. Unclear baseline, 1.2 on 01/08/2017 but has been consistently in the 1.9-2.1 range since most recent discharge. - daily bmp - lasix 40mg  IV x2 as above, likely decrease 10/24 - avoid nephrotoxic agents - home hctz and ramipril held at dc, continue to hold  Chronic Hyponatremia Likely secondary to fluid overload. Na+ on admission was 131. Na 134 this am. - BMP am - continue to monitor for signs of hyponatremia - add NS if becomes symptomatic (HA, N/V, confusion, etc) - fluid restriction  Mechanical Mitral Valve/  Anticoagulation INR 2.26, bridging with heparin gtt to therapeutic which is between 2.5-3.5. Pharmacy helping to dose warfarin, appreciate their recs.  - heparin gtt bridge to therapeutic warfarin - warfarin dosing per pharm recs  Macrocytic anemia B12 89 and MMA 3401. Sees heme/onc for this and h/o prostate cancer. Dr. Burr Medico to see in hospital today, will follow up recs. 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 Will follow up heme/onc. - f/u up heme/onc - continue to monitor for signs of exacerbation  Essential hypertension poorly controlled on this admission. Systolic [id number] - continue to diurese as above - vital signs per floor protocol   Paroxysmal atrial fibrillation Home dose warfarin 1.5mg  M,W,F and 1mg  all other days. INR is 2.32 on admission which subtherapeutic for mechanical valve. Will consult pharmacy for dosing recommendations. Bridge to therapeutic warfarin level with lovenox. - heparin gtt - appreciate pharmacy recs  Pancytopenia- Workup outpatient per heme/onc. retic count, iron studies, folic acid, T55, MMA, SPEP with immunofixation, light chain levels, LDH, haptoglobin ordered. - f/u heme/onc - CBC am  H/O Prostate cancer S/P resection back in 2008. PSA 0.18 on 09/23 with no new symptoms. Sees urology yearly.  Hyperlipidemia Last cholesterol panel 12/2014, chol 132, HDL 36, LDL 71 - continue home rosuvastatin 40mg  - continue home ezetimibe 10mg  - repeat lipid panel - holding home niacin  GERD - continue home protonix   FEN/GI: heart healthy diet Prophylaxis: lovenox bridge to therapeutic coumadin level  Disposition: discharge pending GI assessment  Subjective:  NO acute  events overnight. Feeling much better s/p paracentesis.  Objective: Temp:  [97.7 F (36.5 C)-98.6 F (37 C)] 98.4 F (36.9 C)  (10/23 0519) Pulse Rate:  [74-79] 74 (10/23 0519) Resp:  [16-24] 24 (10/23 0519) BP: (103-131)/(54-73) 103/54 (10/23 0519) SpO2:  [95 %-99 %] 95 % (10/23 0519) Weight:  [82.6 kg] 82.6 kg (10/23 0519) Physical Exam: General: elderly caucasian male resting comfortably in bed Cardiovascular: RRR, no m/r/g, mechanical click noted at left sternal border Respiratory: CTAB Abdomen: distended, positive fluid wave Extremities: BLE 4+ pitting edema, extremely edematous feet bilaterally. 2+ edema from knee to hips. Trace edema in abdomen and bilateral upper extremity  Laboratory: Recent Labs  Lab 01/03/18 1114 01/04/18 0343 01/05/18 0419  WBC 5.0 2.9* 2.7*  HGB 12.2* 9.8* 9.8*  HCT 37.9* 30.9* 29.0*  PLT 217 159 129*   Recent Labs  Lab 01/03/18 1114 01/03/18 1930 01/04/18 0343 01/05/18 0419  NA 131*  --  133* 134*  K 4.2  --  4.0 3.2*  CL 98  --  100 100  CO2 25  --  26 26  BUN 23  --  23 25*  CREATININE 1.89*  --  2.05* 2.10*  CALCIUM 8.6*  --  8.3* 8.0*  PROT  --   --  4.6*  --   BILITOT  --  1.0 0.6  --   ALKPHOS  --   --  70  --   ALT  --   --  36  --   AST  --   --  58*  --   GLUCOSE 138*  --  126* 123*    Imaging/Diagnostic Tests: Dg Chest 2 View  Result Date: 01/03/2018 CLINICAL DATA:  Sob for several days EXAM: CHEST - 2 VIEW COMPARISON:  12/01/2017 FINDINGS: Sternotomy wires overlies stable enlarged cardiac silhouette is increasing LEFT pleural effusion. No pulmonary edema. No pneumothorax. No acute osseous abnormality. IMPRESSION: Increasing LEFT unilateral pleural effusion. Electronically Signed   By: Suzy Bouchard M.D.   On: 01/03/2018 11:57   US Abdomen Limited  Result Date: 01/03/2018 CLINICAL DATA:  Ascites EXAM: LIMITED ABDOMEN ULTRASOUND FOR ASCITES TECHNIQUE: Limited ultrasound survey for ascites was performed in all four abdominal quadrants. COMPARISON:  CT scan 12/04/2017 FINDINGS: Survey of all 4 quadrants demonstrates a moderate to prominent amount  of ascites. Depth of ascites pocket up to 5.0 cm in the right upper quadrant, 10.2 cm in the right lower quadrant, 3.0 cm in the left upper quadrant, and 8.0 cm in the left lower quadrant. A left pleural effusion is also observed. IMPRESSION: 1. Moderate to prominent ascites. 2. Left pleural effusion. Electronically Signed   By: Van Clines M.D.   On: 01/03/2018 17:15   Ir Paracentesis  Result Date: 01/04/2018 INDICATION: Patient with history of abdominal distention and recurrent ascites. Request is made for diagnostic and therapeutic paracentesis. Removal limit to 4 L today due to renal insufficiency. EXAM: ULTRASOUND GUIDED DIAGNOSTIC AND THERAPEUTIC PARACENTESIS MEDICATIONS: 10 mL of 1% lidocaine COMPLICATIONS: None immediate. PROCEDURE: Informed written consent was obtained from the patient after a discussion of the risks, benefits and alternatives to treatment. A timeout was performed prior to the initiation of the procedure. Initial ultrasound scanning demonstrates a large amount of ascites within the right lower abdominal quadrant. The right lower abdomen was prepped and draped in the usual sterile fashion. 1% lidocaine was used for local anesthesia. Following this, a 19 gauge, 7-cm, Yueh catheter was introduced. An ultrasound image was saved  for documentation purposes. The paracentesis was performed. The catheter was removed and a dressing was applied. The patient tolerated the procedure well without immediate post procedural complication. FINDINGS: A total of approximately 4 L of light yellow, milky thick fluid was removed. Samples were sent to the laboratory as requested by the clinical team. IMPRESSION: Successful ultrasound-guided paracentesis yielding 4 L of peritoneal fluid. Read by: Earley Abide, PA-C Electronically Signed   By: Lucrezia Europe M.D.   On: 01/04/2018 15:41   Guadalupe Dawn, MD 01/05/2018, 9:00 AM PGY-2, [place] Intern pager: [phone], text pages  welcome

## 2018-01-06 ENCOUNTER — Inpatient Hospital Stay (HOSPITAL_COMMUNITY): Payer: Medicare Other

## 2018-01-06 ENCOUNTER — Other Ambulatory Visit: Payer: Self-pay | Admitting: Family Medicine

## 2018-01-06 ENCOUNTER — Other Ambulatory Visit: Payer: Self-pay

## 2018-01-06 ENCOUNTER — Other Ambulatory Visit: Payer: Self-pay | Admitting: Interventional Cardiology

## 2018-01-06 DIAGNOSIS — I898 Other specified noninfective disorders of lymphatic vessels and lymph nodes: Secondary | ICD-10-CM

## 2018-01-06 DIAGNOSIS — Z952 Presence of prosthetic heart valve: Secondary | ICD-10-CM

## 2018-01-06 LAB — BASIC METABOLIC PANEL
ANION GAP: 6 (ref 5–15)
BUN: 25 mg/dL — ABNORMAL HIGH (ref 8–23)
CHLORIDE: 99 mmol/L (ref 98–111)
CO2: 27 mmol/L (ref 22–32)
CREATININE: 2.02 mg/dL — AB (ref 0.61–1.24)
Calcium: 7.9 mg/dL — ABNORMAL LOW (ref 8.9–10.3)
GFR calc non Af Amer: 31 mL/min — ABNORMAL LOW (ref >60)
GFR, EST AFRICAN AMERICAN: 36 mL/min — AB (ref >60)
Glucose, Bld: 128 mg/dL — ABNORMAL HIGH (ref 70–99)
Potassium: 3.8 mmol/L (ref 3.5–5.1)
Sodium: 132 mmol/L — ABNORMAL LOW (ref 135–145)

## 2018-01-06 LAB — CBC
HCT: 29.9 % — ABNORMAL LOW (ref 39.0–52.0)
HEMOGLOBIN: 10.2 g/dL — AB (ref 13.0–17.0)
MCH: 33.6 pg (ref 26.0–34.0)
MCHC: 34.1 g/dL (ref 30.0–36.0)
MCV: 98.4 fL (ref 80.0–100.0)
Platelets: 136 10*3/uL — ABNORMAL LOW (ref 150–400)
RBC: 3.04 MIL/uL — ABNORMAL LOW (ref 4.22–5.81)
RDW: 13.4 % (ref 11.5–15.5)
WBC: 2.7 10*3/uL — AB (ref 4.0–10.5)
nRBC: 0 % (ref 0.0–0.2)

## 2018-01-06 LAB — HEPARIN LEVEL (UNFRACTIONATED)
HEPARIN UNFRACTIONATED: 0.47 [IU]/mL (ref 0.30–0.70)
HEPARIN UNFRACTIONATED: 0.35 [IU]/mL (ref 0.30–0.70)

## 2018-01-06 LAB — PROTIME-INR
INR: 2.41
Prothrombin Time: 25.9 seconds — ABNORMAL HIGH (ref 11.4–15.2)

## 2018-01-06 LAB — PH, BODY FLUID: pH, Body Fluid: 7.4

## 2018-01-06 LAB — LIPASE, BLOOD: LIPASE: 55 U/L — AB (ref 11–51)

## 2018-01-06 LAB — AMYLASE: Amylase: 188 U/L — ABNORMAL HIGH (ref 28–100)

## 2018-01-06 LAB — CEA: CEA: 1.8 ng/mL (ref 0.0–4.7)

## 2018-01-06 LAB — AFP TUMOR MARKER: AFP, Serum, Tumor Marker: 3.4 ng/mL (ref 0.0–8.3)

## 2018-01-06 MED ORDER — HEPARIN (PORCINE) IN NACL 100-0.45 UNIT/ML-% IJ SOLN
1250.0000 [IU]/h | INTRAMUSCULAR | Status: DC
  Administered 2018-01-06 – 2018-01-07 (×2): 1250 [IU]/h via INTRAVENOUS
  Filled 2018-01-06 (×2): qty 250

## 2018-01-06 MED ORDER — WARFARIN SODIUM 1 MG PO TABS
1.5000 mg | ORAL_TABLET | Freq: Once | ORAL | Status: AC
  Administered 2018-01-06: 1.5 mg via ORAL
  Filled 2018-01-06: qty 1

## 2018-01-06 MED ORDER — FENTANYL CITRATE (PF) 100 MCG/2ML IJ SOLN
INTRAMUSCULAR | Status: AC | PRN
  Administered 2018-01-06: 50 ug via INTRAVENOUS

## 2018-01-06 MED ORDER — MIDAZOLAM HCL 2 MG/2ML IJ SOLN
INTRAMUSCULAR | Status: AC
  Filled 2018-01-06: qty 2

## 2018-01-06 MED ORDER — MIDAZOLAM HCL 2 MG/2ML IJ SOLN
INTRAMUSCULAR | Status: AC | PRN
  Administered 2018-01-06: 1 mg via INTRAVENOUS

## 2018-01-06 MED ORDER — FUROSEMIDE 40 MG PO TABS
40.0000 mg | ORAL_TABLET | Freq: Every day | ORAL | Status: DC
  Administered 2018-01-07 – 2018-01-08 (×2): 40 mg via ORAL
  Filled 2018-01-06 (×2): qty 1

## 2018-01-06 MED ORDER — FENTANYL CITRATE (PF) 100 MCG/2ML IJ SOLN
INTRAMUSCULAR | Status: AC
  Filled 2018-01-06: qty 2

## 2018-01-06 MED ORDER — LIDOCAINE-EPINEPHRINE 1 %-1:100000 IJ SOLN
INTRAMUSCULAR | Status: AC
  Filled 2018-01-06: qty 1

## 2018-01-06 NOTE — Progress Notes (Signed)
Plan: Endoscopy, IR consult for bone marrow BX.

## 2018-01-06 NOTE — Procedures (Signed)
Pre-procedure Diagnosis: Pancytopenia of unknown etiology  Post-procedure Diagnosis: Same  Technically successful CT guided bone marrow aspiration and biopsy of left iliac crest.   Complications: None Immediate  EBL: None  SignedSandi Mariscal Pager: [phone] 01/06/2018, 10:29 AM

## 2018-01-06 NOTE — Progress Notes (Signed)
ANTICOAGULATION CONSULT NOTE - f/u consult  Pharmacy Consult for warfarin and heparin Indication: VTE prophylaxis due to prosthetic mitral heart valve.  Allergies  Allergen Reactions  . Penicillins Other (See Comments)    REACTION: UNKNOWN    Patient Measurements: Height: 5\' 11"  (180.3 cm) Weight: 182 lb 3.2 oz (82.6 kg) IBW/kg (Calculated) : 75.3  Heparin dosing weight: 86.2 kg  Vital Signs: Temp: 98.7 F (37.1 C) (10/24 1429) Temp Source: Oral (10/24 1429) BP: 124/83 (10/24 1429) Pulse Rate: 77 (10/24 1429)  Labs: Recent Labs    01/04/18 0343 01/04/18 1048  01/05/18 0419 01/05/18 0420  01/05/18 1948 01/06/18 0333 01/06/18 2010  HGB 9.8*  --   --  9.8*  --   --   --  10.2*  --   HCT 30.9*  --   --  29.0*  --   --   --  29.9*  --   PLT 159  --   --  129*  --   --   --  136*  --   LABPROT  --  24.1*  --   --  24.6*  --   --  25.9*  --   INR  --  2.20  --   --  2.26  --   --  2.41  --   HEPARINUNFRC  --   --    < >  --  0.29*   < > 0.52 0.47 0.35  CREATININE 2.05*  --   --  2.10*  --   --   --  2.02*  --    < > = values in this interval not displayed.    Estimated Creatinine Clearance: 34.2 mL/min (A) (by C-G formula based on SCr of 2.02 mg/dL (H)).   Medical History: Past Medical History:  Diagnosis Date  . Anasarca 12/01/2017  . Atrial fibrillation ([place]) 07/12/2013   Perioperative, 2001   . CAD (coronary artery disease) of artery bypass graft 07/12/2013   Saphenous vein graft 2001   . Chronic anticoagulation 07/12/2013  . Essential hypertension 07/12/2013  . History of mitral valve replacement with mechanical valve 01/11/2013   Mechanical mitral valve 2001  Bacterial endocarditis as the cause   . HOH (hard of hearing)   . Prostate cancer ([place]) 07/12/2013     Assessment: Pt is a 37 YOM on chronic warfarin due to mitral valve replacement. Patient is s/p paracentesis on 01/04/18, on heparin bridge to therapeutic INR. INR 2.41 this AM. May need to hold coumadin  again for further procedure like bone bx. Will f/u with MD on this.   Heparin drip and coumadin resumed today post biopsy per  Dr. Pascal Lux. The  8 hour heparin level is 0.35, therapeutic on heparin drip 1250 units/hr. No bleeding noted.  Goal of Therapy:  INR 2.5-3.5  Heparin level 0.3-0.7 Monitor platelets by anticoagulation protocol: Yes   Plan:  Continue heparin at 1250 units/hr Daily HL/CBC/INR  Nicole Cella, RPh Clinical Pharmacist Please check AMION for all [place] phone numbers After 10:00 PM, call [place] [phone] 01/06/2018 9:08 PM

## 2018-01-06 NOTE — Progress Notes (Signed)
Report called to receiving RN on 2C. Transport came to take Pt to CT for biopsy. Transport & CT aware that Pt is to be taken to [facility] room 1 after testing.

## 2018-01-06 NOTE — Discharge Summary (Addendum)
CALL PAGER [phone] for any questions or notifications regarding this patient   FMTS Attending Daily Note: Mario Singh, MD  Pager [phone]  Office [phone] I have seen and examined this patient, reviewed their chart. I have discussed this patient with the resident. I agree with the resident's findings, assessment and care plan.  Chronic kidney disease, slightly worsened during this hospitalization.  I discussed with the patient and his daughter at length that given the slight rising creatinine I would recommend that he stay overnight for monitoring and repeat kidney function tomorrow.  Suspect this is from a bit of over diuresis with IV Lasix earlier in hospital stay.  We discussed this at length. The patient very much wants to leave the hospital we discussed the risks including of that Lovenox may be supratherapeutic as it is cleared through the kidney we discussed the risk of easy bleeding and bruising as well as long-term damage to his kidneys.  He discussed at length.  The patient very much wishes to leave the hospital.  We will arrange very close follow-up on Monday to check his metabolic panel and ensure that his creatinine clearance can tolerate the continued dosing of the Lovenox.Warfarin dosing- got 1.5 mg today before leaving, he will take his usual daily dose (1 mg on all days except 1.5 mg MWF) per pharmacy.    [facility] Discharge Summary  Patient name: Mario Powell Medical record number: [id number] Date of birth: 1945/08/01 Age: 72 y.o. 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 Pleural effusion- left sided  Acute on chronic CKD III Chronic  hyponatremia Mechanical mitral valve Paroxysmal atrial fibrillation Pancytopenia Essential HTN H/o prostate cancer s/p radiation HLD GERD  Disposition: discharge home with heme/onc and GI follow up  Discharge Condition: stable  Discharge Exam:  General: resting comfortably in bed Heart: RRR, mechanical valve click heard best on left sternal border Pulm: CTAB Abd: firm and distended but negative fluid wave and positive skin wrinkling Extremities: lower extremities 3+ pitting edema but skin is less taut than previous exams Derm: no rashes or lesions, chronic skin changes in lower extremities Neuro: alert and oriented, moving all extremities equally and appropriately Psych: cooperative  Brief Hospital Course:  Patient returned to hospital for worsening abdominal swelling and weight gain. Paracentesis revealed ~4.5L chylous fluid.  Ruled out for pancreatitis, nephrotic syndrome, infectious or hepatic cause. Cause ist still unknown. Neoplastic cause suspected- CEA and AFP negative. Oncology was consulted and obtained bone marrow biopsy and other labs- will follow up outpatient.  Patient also had pleural effusion. Discussed thoracentesis which he declined- denies symptoms. Pleural plaques were discovered on CT. Patient aware and will need outpatient follow up with oncology. He was subtherapeutic with anticoagulation for mechanical valve on coumadin and required heparin bridge while inpatient with a lovenox bridge on discharge. Patient was counseled on risks of discharge subtherapeutic range.  During hospital stay, also had mild AKI. This was stable throughout hospital stay. Will follow up BMET on Monday.   Issues for Follow Up:  1. Will need follow up with heme/onc for bone marrow biopsy results, pleural plaques discovered on CT, and pancytopenia 2. Will need follow up with radiology for lymphangiogram (Dr. Laurence Ferrari in [place] and also regerral to  [place] center) 3. Will need  follow up with PCP for weight checks and anticoagulation monitoring (INR on day of discharge 2.45- discharged on home warfarin and lovenox bridge). Prescribed Lasix every other day and to monitor weight with instructions to contact PCP for therapeutic paracentesis if gains >3lbs in day or 5lbs in week. 1. Follow up on INR, weight, 24hr urine collection, bone marrow biopsy, acid fast testing on abdominal fluid and adenoside deaminase  Significant Procedures: paracentesis, bone marrow biopsy  Significant Labs and Imaging:  Recent Labs  Lab 01/06/18 0333 01/07/18 0455 01/08/18 0452  WBC 2.7* 2.8* 3.7*  HGB 10.2* 9.8* 11.2*  HCT 29.9* 30.4* 33.9*  PLT 136* 122* 127*   Recent Labs  Lab 01/04/18 0343 01/05/18 0419 01/06/18 0333 01/07/18 0531 01/08/18 0452  NA 133* 134* 132* 136 134*  K 4.0 3.2* 3.8 4.4 4.3  CL 100 100 99 101 103  CO2 '26 26 27 26 25  ' GLUCOSE 126* 123* 128* 116* 117*  BUN 23 25* 25* 25* 27*  CREATININE 2.05* 2.10* 2.02* 2.09* 2.17*  CALCIUM 8.3* 8.0* 7.9* 8.1* 8.4*  ALKPHOS 70  --   --   --  65  AST 58*  --   --   --  81*  ALT 36  --   --   --  52*  ALBUMIN 2.2*  --   --   --  2.4*   Amylase 188 Lipase 55 CEA 1.8 AFP 3.4  Results/Tests Pending at Time of Discharge: Acid fast cultures, and adenoside deaminase from pleural fluid, bone marrow biopsy, 24hr urine test  Discharge Medications:  Allergies as of 01/08/2018      Reactions   Penicillins Other (See Comments)   REACTION: UNKNOWN      Medication List    STOP taking these medications   niacin 1000 MG CR tablet Commonly known as:  NIASPAN     TAKE these medications   enoxaparin 80 MG/0.8ML injection Commonly known as:  LOVENOX Inject 0.8 mLs (80 mg total) into the skin every 12 (twelve) hours.   ezetimibe 10 MG tablet Commonly known as:  ZETIA Take 1 tablet (10 mg total) by mouth daily.   feeding supplement (PRO-STAT SUGAR FREE 64) Liqd Take 30 mLs by mouth 3 (three) times daily with  meals.   furosemide 40 MG tablet Commonly known as:  LASIX Take 1 tablet (40 mg total) by mouth every other day.   medium chain triglycerides oil Commonly known as:  MCT OIL Take 15 mLs by mouth 3 (three) times daily with meals. What changed:  when to take this   multivitamin with minerals Tabs tablet Take 1 tablet by mouth daily.   pantoprazole 40 MG tablet Commonly known as:  PROTONIX Take 1 tablet (40 mg total) by mouth 2 (two) times daily.   rosuvastatin 40 MG tablet Commonly known as:  CRESTOR Take 1 tablet (40 mg total) by mouth daily.   warfarin 1 MG tablet Commonly known as:  COUMADIN Take as directed. If you are unsure how to take this medication, talk to your nurse or doctor. Original instructions:  TAKE AS DIRECTED BY ANTICOAGULATION CLINIC What changed:  See the new instructions.       Discharge Instructions: Please refer to Patient Instructions section of EMR for full details.  Patient was counseled important signs and symptoms that should prompt return to medical care, changes in medications, dietary instructions, activity restrictions, and follow up appointments.   Follow-Up Appointments:  10/28 CMET 10/29 coumadin clinic 11/14 PCP 2/6 oncology Gastroenterology follow up at [facility]  Richarda Osmond, DO 01/08/2018, 5:10 PM PGY-1, [place]

## 2018-01-06 NOTE — Progress Notes (Signed)
This RN observed Oncology note from last night stating to add tuberculosis cx to peritoneal fluid collected. Agricultural consultant notified. Mel Almond, DO notified and ordered airborne and contact precautions. Orders clarified with Mel Almond, DO. [facility] notified of need for negative pressure room. Mel Almond, DO notified that no negative pressure room is available, so this will take some time.

## 2018-01-06 NOTE — Progress Notes (Addendum)
ANTICOAGULATION CONSULT NOTE - f/u consult  Pharmacy Consult for warfarin and heparin Indication: VTE prophylaxis due to prosthetic mitral heart valve.  Allergies  Allergen Reactions  . Penicillins Other (See Comments)    REACTION: UNKNOWN    Patient Measurements: Height: 5\' 11"  (180.3 cm) Weight: 182 lb 3.2 oz (82.6 kg) IBW/kg (Calculated) : 75.3  Heparin dosing weight: 86.2 kg  Vital Signs: Temp: 98.4 F (36.9 C) (10/24 0455) Temp Source: Oral (10/24 0455) BP: 126/75 (10/24 0455) Pulse Rate: 70 (10/24 0455)  Labs: Recent Labs    01/04/18 0343 01/04/18 1048  01/05/18 0419 01/05/18 0420 01/05/18 1206 01/05/18 1948 01/06/18 0333  HGB 9.8*  --   --  9.8*  --   --   --  10.2*  HCT 30.9*  --   --  29.0*  --   --   --  29.9*  PLT 159  --   --  129*  --   --   --  136*  LABPROT  --  24.1*  --   --  24.6*  --   --  25.9*  INR  --  2.20  --   --  2.26  --   --  2.41  HEPARINUNFRC  --   --    < >  --  0.29* 0.46 0.52 0.47  CREATININE 2.05*  --   --  2.10*  --   --   --  2.02*   < > = values in this interval not displayed.    Estimated Creatinine Clearance: 34.2 mL/min (A) (by C-G formula based on SCr of 2.02 mg/dL (H)).   Medical History: Past Medical History:  Diagnosis Date  . Anasarca 12/01/2017  . Atrial fibrillation ([place]) 07/12/2013   Perioperative, 2001   . CAD (coronary artery disease) of artery bypass graft 07/12/2013   Saphenous vein graft 2001   . Chronic anticoagulation 07/12/2013  . Essential hypertension 07/12/2013  . History of mitral valve replacement with mechanical valve 01/11/2013   Mechanical mitral valve 2001  Bacterial endocarditis as the cause   . HOH (hard of hearing)   . Prostate cancer ([place]) 07/12/2013     Assessment: Pt is a 7 YOM on chronic warfarin due to mitral valve replacement. Patient is s/p paracentesis, on heparin bridge to therapeutic INR. INR 2.41 this AM. May need to hold coumadin again for further procedure like bone bx. Will  f/u with MD on this.   Heparin therapeutic at 0.47. H/H stable. Plt has dropped to 136. He is s/p bx now. Dr. Pascal Lux is ok with resuming heparin and coumadin.  Goal of Therapy:  INR 2.5-3.5  Heparin level 0.3-0.7 Monitor platelets by anticoagulation protocol: Yes   Plan:   Resume heparin at 1250 units/hr F/u with 8 hr level Coumadin 1.5mg  PO x1 Daily HL/CBC/INR  Onnie Boer, PharmD, [place], AAHIVP, CPP Infectious Disease Pharmacist Pager: [phone] 01/06/2018 8:31 AM

## 2018-01-06 NOTE — Consult Note (Signed)
Chief Complaint: Patient was seen in consultation today for bone marrow biopsy  Referring Physician(s): Dr. Truitt Merle  Supervising Physician: Sandi Mariscal  Patient Status: [facility] - In-pt  History of Present Illness: Mario Powell is a 72 y.o. male with a past medical history significant for a.fib and mitral valve replacement with mechanical valve on coumadin, anasarca, CAD, HTN, gastric neuroendocrine tumor and prostate cancer with chemotherapy beads placement. Patient presented to the ED on 10/21 due to worsening swelling in his abdomen and legs on Lasix. He was admitted for diuresis and further evaluation. He underwent paracentesis on 10/22 which showed chylous ascites not consistent with cirrhosis, heart failure or infection; further testing currently pending. He also was found to have pancytopenia of unknown etiology - request has been made by Dr. Burr Medico for bone marrow biopsy in IR.  Patient states he is very upset this morning because he just found out he is being transferred to another room and he does not think that needs to happen. He states he feels fine and feels that moving to another room will make him sick. He states he is overall frustrated with being at the hospital and feels that he is not getting enough information regarding his care. He denies any physical complaints at the moment.  Past Medical History:  Diagnosis Date  . Anasarca 12/01/2017  . Atrial fibrillation ([place]) 07/12/2013   Perioperative, 2001   . CAD (coronary artery disease) of artery bypass graft 07/12/2013   Saphenous vein graft 2001   . Chronic anticoagulation 07/12/2013  . Essential hypertension 07/12/2013  . History of mitral valve replacement with mechanical valve 01/11/2013   Mechanical mitral valve 2001  Bacterial endocarditis as the cause   . HOH (hard of hearing)   . Prostate cancer ([place]) 07/12/2013    Past Surgical History:  Procedure Laterality Date  . BIOPSY  12/06/2017   Procedure: BIOPSY;   Surgeon: Otis Brace, MD;  Location: [facility];  Service: Gastroenterology;;  . COLONOSCOPY WITH PROPOFOL N/A 12/06/2017   Procedure: COLONOSCOPY WITH PROPOFOL ;  Surgeon: Otis Brace, MD;  Location: [place];  Service: Gastroenterology;  Laterality: N/A;  . CORONARY ARTERY BYPASS GRAFT     2001  . ESOPHAGOGASTRODUODENOSCOPY (EGD) WITH PROPOFOL N/A 12/06/2017   Procedure: ESOPHAGOGASTRODUODENOSCOPY (EGD) WITH PROPOFOL;  Surgeon: Otis Brace, MD;  Location: [place];  Service: Gastroenterology;  Laterality: N/A;  . IR PARACENTESIS  12/03/2017  . IR PARACENTESIS  01/04/2018  . MITRAL VALVE REPLACEMENT  2001   Mechanical prosthesis  . POLYPECTOMY  12/06/2017   Procedure: POLYPECTOMY;  Surgeon: Otis Brace, MD;  Location: [facility];  Service: Gastroenterology;;    Allergies: Penicillins  Medications: Prior to Admission medications   Medication Sig Start Date End Date Taking? Authorizing Provider  Amino Acids-Protein Hydrolys (FEEDING SUPPLEMENT, PRO-STAT SUGAR FREE 64,) LIQD Take 30 mLs by mouth 3 (three) times daily with meals. 12/09/17  Yes Wilber Oliphant, MD  ezetimibe (ZETIA) 10 MG tablet Take 1 tablet (10 mg total) by mouth daily. 01/11/17  Yes Belva Crome, MD  furosemide (LASIX) 40 MG tablet Take 1 tablet (40 mg total) by mouth every other day. 12/17/17 01/16/18 Yes Wilber Oliphant, MD  medium chain triglycerides (MCT OIL) oil Take 15 mLs by mouth 3 (three) times daily with meals. Patient taking differently: Take 15 mLs by mouth at bedtime.  12/08/17  Yes Wilber Oliphant, MD  Multiple Vitamin (MULTIVITAMIN WITH MINERALS) TABS tablet Take 1 tablet by mouth  daily. 12/09/17  Yes Wilber Oliphant, MD  niacin (NIASPAN) 1000 MG CR tablet Take 1 tablet (1,000 mg total) by mouth at bedtime. Patient taking differently: Take 1,000 mg by mouth daily.  01/11/17  Yes Belva Crome, MD  pantoprazole (PROTONIX) 40 MG tablet Take 1 tablet (40 mg total) by mouth 2 (two) times  daily. 12/08/17 01/06/18 Yes Wilber Oliphant, MD  rosuvastatin (CRESTOR) 40 MG tablet Take 1 tablet (40 mg total) by mouth daily. 01/11/17  Yes Belva Crome, MD  warfarin (COUMADIN) 1 MG tablet TAKE AS DIRECTED BY ANTICOAGULATION CLINIC Patient taking differently: Take 1 mg by mouth See admin instructions. Take 1 tablet (1.5 MG) by mouth Monday Wednesday and Friday. Take 1 mg all the other days 07/27/17  Yes Belva Crome, MD     Family History  Problem Relation Age of Onset  . Heart attack Father   . Healthy Mother   . Healthy Sister   . Healthy Sister     Social History   Socioeconomic History  . Marital status: Widowed    Spouse name: Not on file  . Number of children: Not on file  . Years of education: Not on file  . Highest education level: Not on file  Occupational History  . Not on file  Social Needs  . Financial resource strain: Not on file  . Food insecurity:    Worry: Not on file    Inability: Not on file  . Transportation needs:    Medical: Not on file    Non-medical: Not on file  Tobacco Use  . Smoking status: Former Research scientist (life sciences)  . Smokeless tobacco: Never Used  . Tobacco comment: QUIT IN 09/1982  Substance and Sexual Activity  . Alcohol use: Not Currently    Comment: NO ALCOHOL SINCE CHRISTMAS 2017  . Drug use: Never  . Sexual activity: Not on file  Lifestyle  . Physical activity:    Days per week: Not on file    Minutes per session: Not on file  . Stress: Not on file  Relationships  . Social connections:    Talks on phone: Not on file    Gets together: Not on file    Attends religious service: Not on file    Active member of club or organization: Not on file    Attends meetings of clubs or organizations: Not on file    Relationship status: Not on file  Other Topics Concern  . Not on file  Social History Narrative   Daughters Judson Roch and Sharyn Lull   Works for [employer], mainly at desk all day.    Performs all ADLs     Review of Systems: A 12 point ROS  discussed and pertinent positives are indicated in the HPI above.  All other systems are negative.  Review of Systems  Constitutional: Negative for chills and fever.  Respiratory: Negative for cough and shortness of breath.   Cardiovascular: Positive for leg swelling. Negative for chest pain.  Gastrointestinal: Positive for abdominal distention. Negative for abdominal pain, diarrhea, nausea and vomiting.  Skin: Negative for rash.  Neurological: Negative for dizziness and syncope.  Psychiatric/Behavioral: Negative for confusion.    Vital Signs: BP 126/75 (BP Location: Left Arm)   Pulse 70   Temp 98.4 F (36.9 C) (Oral)   Resp 16   Ht '5\' 11"'  (1.803 m)   Wt 182 lb 3.2 oz (82.6 kg)   SpO2 96%   BMI 25.41 kg/m   Physical  Exam  Constitutional: He is oriented to person, place, and time. No distress.  HENT:  Head: Normocephalic.  Cardiovascular: Normal rate, regular rhythm and normal heart sounds.  Pulmonary/Chest: Effort normal.  Decreased lung sounds bilaterally  Abdominal: He exhibits distension. There is no tenderness.  Neurological: He is alert and oriented to person, place, and time.  Skin: Skin is warm and dry. He is not diaphoretic.  Tattoo right arm; dried blood under IV dressing  Psychiatric: He has a normal mood and affect. His behavior is normal. Judgment and thought content normal.     MD Evaluation Airway: WNL Heart: WNL Abdomen: WNL Chest/ Lungs: WNL ASA  Classification: 3 Mallampati/Airway Score: One   Imaging: Dg Chest 2 View  Result Date: 01/03/2018 CLINICAL DATA:  Sob for several days EXAM: CHEST - 2 VIEW COMPARISON:  12/01/2017 FINDINGS: Sternotomy wires overlies stable enlarged cardiac silhouette is increasing LEFT pleural effusion. No pulmonary edema. No pneumothorax. No acute osseous abnormality. IMPRESSION: Increasing LEFT unilateral pleural effusion. Electronically Signed   By: Suzy Bouchard M.D.   On: 01/03/2018 11:57   US Abdomen  Limited  Result Date: 01/03/2018 CLINICAL DATA:  Ascites EXAM: LIMITED ABDOMEN ULTRASOUND FOR ASCITES TECHNIQUE: Limited ultrasound survey for ascites was performed in all four abdominal quadrants. COMPARISON:  CT scan 12/04/2017 FINDINGS: Survey of all 4 quadrants demonstrates a moderate to prominent amount of ascites. Depth of ascites pocket up to 5.0 cm in the right upper quadrant, 10.2 cm in the right lower quadrant, 3.0 cm in the left upper quadrant, and 8.0 cm in the left lower quadrant. A left pleural effusion is also observed. IMPRESSION: 1. Moderate to prominent ascites. 2. Left pleural effusion. Electronically Signed   By: Van Clines M.D.   On: 01/03/2018 17:15   Ir Paracentesis  Result Date: 01/04/2018 INDICATION: Patient with history of abdominal distention and recurrent ascites. Request is made for diagnostic and therapeutic paracentesis. Removal limit to 4 L today due to renal insufficiency. EXAM: ULTRASOUND GUIDED DIAGNOSTIC AND THERAPEUTIC PARACENTESIS MEDICATIONS: 10 mL of 1% lidocaine COMPLICATIONS: None immediate. PROCEDURE: Informed written consent was obtained from the patient after a discussion of the risks, benefits and alternatives to treatment. A timeout was performed prior to the initiation of the procedure. Initial ultrasound scanning demonstrates a large amount of ascites within the right lower abdominal quadrant. The right lower abdomen was prepped and draped in the usual sterile fashion. 1% lidocaine was used for local anesthesia. Following this, a 19 gauge, 7-cm, Yueh catheter was introduced. An ultrasound image was saved for documentation purposes. The paracentesis was performed. The catheter was removed and a dressing was applied. The patient tolerated the procedure well without immediate post procedural complication. FINDINGS: A total of approximately 4 L of light yellow, milky thick fluid was removed. Samples were sent to the laboratory as requested by the clinical  team. IMPRESSION: Successful ultrasound-guided paracentesis yielding 4 L of peritoneal fluid. Read by: Earley Abide, PA-C Electronically Signed   By: Lucrezia Europe M.D.   On: 01/04/2018 15:41    Labs:  CBC: Recent Labs    01/03/18 1114 01/04/18 0343 01/05/18 0419 01/06/18 0333  WBC 5.0 2.9* 2.7* 2.7*  HGB 12.2* 9.8* 9.8* 10.2*  HCT 37.9* 30.9* 29.0* 29.9*  PLT 217 159 129* 136*    COAGS: Recent Labs    01/03/18 1114 01/04/18 1048 01/05/18 0420 01/06/18 0333  INR 2.32 2.20 2.26 2.41    BMP: Recent Labs    01/03/18 1114 01/04/18 0343  01/05/18 0419 01/06/18 0333  NA 131* 133* 134* 132*  K 4.2 4.0 3.2* 3.8  CL 98 100 100 99  CO2 '25 26 26 27  ' GLUCOSE 138* 126* 123* 128*  BUN 23 23 25* 25*  CALCIUM 8.6* 8.3* 8.0* 7.9*  CREATININE 1.89* 2.05* 2.10* 2.02*  GFRNONAA 33* 30* 29* 31*  GFRAA 39* 35* 34* 36*    LIVER FUNCTION TESTS: Recent Labs    12/05/17 0341 12/06/17 0552 12/07/17 0730 01/03/18 1930 01/04/18 0343  BILITOT 0.7 0.9 0.8 1.0 0.6  AST 33 40 30  --  58*  ALT '25 29 23  ' --  36  ALKPHOS 48 61 53  --  70  PROT 4.5* 4.9* 4.5*  --  4.6*  ALBUMIN 2.1* 2.3* 2.1*  --  2.2*    TUMOR MARKERS: No results for input(s): AFPTM, CEA, CA199, CHROMGRNA in the last 8760 hours.  Assessment and Plan:  Patient with history of prostate cancer, neuroendocrine tumor s/p resection presented to [facility] ED with worsening abdominal distention and peripheral edema, found to have pancytopenia as well. Ascitic fluid not consistent with cirrhosis, heart failure or infection - further tests are still pending.   Request has been made to IR by Dr. Burr Medico for bone marrow biopsy to further evaluate pancytopenia.   Patient has been NPO since last night, he does normally take coumadin - now on heparin which has been discontinued this morning in anticipation of procedure, INR 2.41, WBC 2.7, H/H 10.2/29.9. He states understanding of procedure and agrees to proceed today.  Risks and benefits  discussed with the patient including, but not limited to bleeding, infection, damage to adjacent structures or low yield requiring additional tests.  All of the patient's questions were answered, patient is agreeable to proceed.  Consent signed and in chart.  Thank you for this interesting consult.  I greatly enjoyed meeting CARA AGUINO and look forward to participating in their care.  A copy of this report was sent to the requesting provider on this date.  Electronically Signed: Joaquim Nam, PA-C 01/06/2018, 8:17 AM   I spent a total of 40 Minutes in face to face in clinical consultation, greater than 50% of which was counseling/coordinating care for bone marrow biopsy.

## 2018-01-06 NOTE — Progress Notes (Signed)
Family Medicine Teaching Service Daily Progress Note Intern Pager: [phone]  Patient name: Mario Powell Medical record number: [id number] Date of birth: 1945/10/15 Age: 72 y.o. Gender: male  Primary Care Provider: Wilber Oliphant, MD Consultants: GI Code Status: full  Pt Overview and Major Events to Date:  10/21 admitted to fpts 10/22 paracentesis  Assessment and Plan:  Anasarca Improved after >4L taken off from paracentesis 10/22. Yesterday 1225cc urine output. Severe 3+ lower extremity pitting edema to thighs, right worse than left. Paracentesis labs suggest non-infectious and not due to cirrhosis. Will continue diuresis for now. GI plans to follow up outpatient. Oncology planning on bone marrow biopsy today via IR and will follow up outpatient. Labs ordered: amylase 188, lipase 55, CEA 1.8 (normal), AFP 3.4 (normal) pending: UPEP/UIFE/light chais, TB culture and adenosine deaminase test to paracentesis fluid per oncology. CT of chest today large layering left pleural effusion, calcified pleural plaques in R lower lobe. CT of abd/pelvis today moderate volume simple intraperitoneal free fluid, no lymphadenopathy, normal splenic volume, aortic atherosclerosis. Bone marrow biopsy today. - vital signs per floor routine - f/u GI - f/u oncology - f/u consulting radiology and chylus specialist at [facility] - f/u bone marrow biopsy results - transition to PO Lasix 60m daily - strict I/Os, daily weights  - BMP am  Acute on CKD III GFR 33 and Creatinine 1.89 on admission. 2.01 today which is improved from yesterday. - daily bmp - lasix 434mIV x2 as above - avoid nephrotoxic agents - home hctz and ramipril held at last dc, continue to hold  Chronic Hyponatremia Likely secondary to fluid overload. Na+ on admission was 131. Na 132 this am. - BMP am - continue to monitor for signs of hyponatremia - add NS if becomes symptomatic (HA, N/V, confusion, etc) - fluid restriction  Mechanical Mitral  Valve/ Anticoagulation- INR 2.41 today. heparin drip bridge. Pharmacy helping to dose warfarin, appreciate their recs.  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 Will follow up heme/onc. - f/u heme/onc - continue to monitor for signs of exacerbation  Essential hypertension moderately controlled Systolic [id number] continue to diurese as above - vital signs per floor protocol   Paroxysmal atrial fibrillation Pharmacy for coumadin dosing. INR 2.41 today with heart rate 70 - appreciate pharmacy recs  Pancytopenia- Workup outpatient per heme/onc. Hgb improved to 10.2 today. B12 89 and MMA 3401 - f/u heme/onc - CBC am - continue B12 supplementation  H/O Prostate cancer S/P resection back in 2008. PSA 0.18 on 09/23 with no new symptoms. Sees urology yearly.  Hyperlipidemia Last cholesterol panel 12/2014, chol 132, HDL 36, LDL 71 - continue home rosuvastatin 405m continue home ezetimibe 9m52mrepeat lipid panel - holding home niacin  GERD - continue home protonix   FEN/GI: heart healthy diet Prophylaxis: lovenox bridge to therapeutic coumadin level  Disposition: discharge pending GI assessment  Subjective:  Overnight: no acute events  Today: patient feels alright. He understandably has a lot of questions and confusions with all the specialists and tests but still no clear answer for cause of his issues.  Objective: Temp:  [97.8 F (36.6 C)-98.4 F (36.9 C)] 98.4 F (36.9 C) (10/24 0455) Pulse Rate:  [70-74] 70 (10/24 0455) Resp:  [16-24] 16 (10/24 0455) BP: (126-148)/(75-81) 126/75 (10/24 0455) SpO2:  [96 %-100 %] 96 % (  10/24 0455) Weight:  [82.6 kg] 82.6 kg (10/24 0455)   Physical Exam: General: elderly caucasian male sitting up in bed Cardiovascular: RRR, no m/r/g, mechanical  click noted at left sternal border Respiratory: CTAB Abdomen: mildly distended, negative fluid wave Extremities: BLE 4+ pitting edema, extremely edematous feet bilaterally  Laboratory: Recent Labs  Lab 01/04/18 0343 01/05/18 0419 01/06/18 0333  WBC 2.9* 2.7* 2.7*  HGB 9.8* 9.8* 10.2*  HCT 30.9* 29.0* 29.9*  PLT 159 129* 136*   Recent Labs  Lab 01/03/18 1930 01/04/18 0343 01/05/18 0419 01/06/18 0333  NA  --  133* 134* 132*  K  --  4.0 3.2* 3.8  CL  --  100 100 99  CO2  --  '26 26 27  ' BUN  --  23 25* 25*  CREATININE  --  2.05* 2.10* 2.02*  CALCIUM  --  8.3* 8.0* 7.9*  PROT  --  4.6*  --   --   BILITOT 1.0 0.6  --   --   ALKPHOS  --  70  --   --   ALT  --  36  --   --   AST  --  58*  --   --   GLUCOSE  --  126* 123* 128*    Imaging/Diagnostic Tests: Ct Abdomen Pelvis Wo Contrast  Result Date: 01/06/2018 CLINICAL DATA:  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 EXAM: CT CHEST, ABDOMEN AND PELVIS WITHOUT CONTRAST TECHNIQUE: Multidetector CT imaging of the chest, abdomen and pelvis was performed following the standard protocol without IV contrast. COMPARISON:  CT 12/04/2017 FINDINGS: CT CHEST FINDINGS Cardiovascular: Coronary artery calcification and aortic atherosclerotic calcification. Mediastinum/Nodes: No axillary supraclavicular adenopathy. No mediastinal hilar adenopathy. No pericardial effusion. Esophagus normal Lungs/Pleura: Large LEFT pleural effusion layers dependently. No airspace disease. No nodularity. Airways normal. Mild bronchiectasis in lower lobes. Calcified pleural plaque in the RIGHT lower lobe (image 85/5). Musculoskeletal: No aggressive osseous lesion CT ABDOMEN PELVIS FINDINGS Hepatobiliary: No focal hepatic lesion on noncontrast exam Pancreas: No pancreatic lesion. Spleen: .  Spleen normal volume Adrenals/Urinary Tract: . adrenal glands, kidneys, ureters and bladder normal. Stomach/Bowel: Stomach, small-bowel, appendix and  cecum normal. The colon and rectosigmoid colon normal. Vascular/Lymphatic: Abdominal aorta is normal caliber with atherosclerotic calcification. There is no retroperitoneal or periportal lymphadenopathy. No pelvic lymphadenopathy. Reproductive: Prostate normal Other: Moderate large volume of intraperitoneal free fluid which is simple fluid attenuation. Musculoskeletal: No aggressive osseous lesion. Schmorl's node endplate of the L1 vertebral body. IMPRESSION: Chest Impression: 1. Large layering LEFT pleural effusion. 2. Calcified pleural plaques in the RIGHT lower lobe. 3. Coronary artery calcification and Aortic Atherosclerosis (ICD10-I70.0). Abdomen / Pelvis Impression: 1. Moderate volume of simple intraperitoneal free fluid consistent with ascites. 2. No lymphadenopathy. 3. Normal splenic volume. 4.  Aortic Atherosclerosis (ICD10-I70.0). Electronically Signed   By: Suzy Bouchard M.D.   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 EXAM: CT CHEST, ABDOMEN AND PELVIS WITHOUT CONTRAST TECHNIQUE: Multidetector CT imaging of the chest, abdomen and pelvis was performed following the standard protocol without IV contrast. COMPARISON:  CT 12/04/2017 FINDINGS: CT CHEST FINDINGS Cardiovascular: Coronary artery calcification and aortic atherosclerotic calcification. Mediastinum/Nodes: No axillary supraclavicular adenopathy. No mediastinal hilar adenopathy. No pericardial effusion. Esophagus normal Lungs/Pleura: Large LEFT pleural effusion layers dependently. No airspace disease. No nodularity. Airways normal. Mild bronchiectasis in lower lobes. Calcified pleural plaque in the RIGHT lower  lobe (image 85/5). Musculoskeletal: No aggressive osseous lesion CT ABDOMEN PELVIS FINDINGS Hepatobiliary: No focal hepatic lesion on noncontrast exam Pancreas: No pancreatic lesion. Spleen: .  Spleen normal volume  Adrenals/Urinary Tract: . adrenal glands, kidneys, ureters and bladder normal. Stomach/Bowel: Stomach, small-bowel, appendix and cecum normal. The colon and rectosigmoid colon normal. Vascular/Lymphatic: Abdominal aorta is normal caliber with atherosclerotic calcification. There is no retroperitoneal or periportal lymphadenopathy. No pelvic lymphadenopathy. Reproductive: Prostate normal Other: Moderate large volume of intraperitoneal free fluid which is simple fluid attenuation. Musculoskeletal: No aggressive osseous lesion. Schmorl's node endplate of the L1 vertebral body. IMPRESSION: Chest Impression: 1. Large layering LEFT pleural effusion. 2. Calcified pleural plaques in the RIGHT lower lobe. 3. Coronary artery calcification and Aortic Atherosclerosis (ICD10-I70.0). Abdomen / Pelvis Impression: 1. Moderate volume of simple intraperitoneal free fluid consistent with ascites. 2. No lymphadenopathy. 3. Normal splenic volume. 4.  Aortic Atherosclerosis (ICD10-I70.0). Electronically Signed   By: Suzy Bouchard M.D.   On: 01/06/2018 10:48   Ct Bone Marrow Biopsy & Aspiration  Result Date: 01/06/2018 INDICATION: Pancytopenia of uncertain etiology. Please perform CT-guided bone marrow biopsy for tissue diagnostic purposes. EXAM: CT-GUIDED BONE MARROW BIOPSY AND ASPIRATION MEDICATIONS: None ANESTHESIA/SEDATION: Fentanyl 50 mcg IV; Versed 1 mg IV Sedation Time: 10 Minutes; The patient was continuously monitored during the procedure by the interventional radiology nurse under my direct supervision. COMPLICATIONS: None immediate. PROCEDURE: Informed consent was obtained from the patient following an explanation of the procedure, risks, benefits and alternatives. The patient understands, agrees and consents for the procedure. All questions were addressed. A time out was performed prior to the initiation of the procedure. The patient was positioned prone and non-contrast localization CT was performed of the pelvis to  demonstrate the iliac marrow spaces. The operative site was prepped and draped in the usual sterile fashion. Under sterile conditions and local anesthesia, a 22 gauge spinal needle was utilized for procedural planning. Next, an 11 gauge coaxial bone biopsy needle was advanced into the left iliac marrow space. Needle position was confirmed with CT imaging. Initially, bone marrow aspiration was performed. Next, a bone marrow biopsy was obtained with the 11 gauge outer bone marrow device. Samples were prepared with the cytotechnologist and deemed adequate. The needle was removed intact. Hemostasis was obtained with compression and a dressing was placed. The patient tolerated the procedure well without immediate post procedural complication. IMPRESSION: Successful CT guided left iliac bone marrow aspiration and core biopsy. Electronically Signed   By: Sandi Mariscal M.D.   On: 01/06/2018 11:00   Ir Paracentesis  Result Date: 01/04/2018 INDICATION: Patient with history of abdominal distention and recurrent ascites. Request is made for diagnostic and therapeutic paracentesis. Removal limit to 4 L today due to renal insufficiency. EXAM: ULTRASOUND GUIDED DIAGNOSTIC AND THERAPEUTIC PARACENTESIS MEDICATIONS: 10 mL of 1% lidocaine COMPLICATIONS: None immediate. PROCEDURE: Informed written consent was obtained from the patient after a discussion of the risks, benefits and alternatives to treatment. A timeout was performed prior to the initiation of the procedure. Initial ultrasound scanning demonstrates a large amount of ascites within the right lower abdominal quadrant. The right lower abdomen was prepped and draped in the usual sterile fashion. 1% lidocaine was used for local anesthesia. Following this, a 19 gauge, 7-cm, Yueh catheter was introduced. An ultrasound image was saved for documentation purposes. The paracentesis was performed. The catheter was removed and a dressing was applied. The patient tolerated the  procedure well without immediate post procedural complication. FINDINGS: A total of  approximately 4 L of light yellow, milky thick fluid was removed. Samples were sent to the laboratory as requested by the clinical team. IMPRESSION: Successful ultrasound-guided paracentesis yielding 4 L of peritoneal fluid. Read by: Earley Abide, PA-C Electronically Signed   By: Lucrezia Europe M.D.   On: 01/04/2018 15:41    Richarda Osmond, DO 01/06/2018, 7:29 AM PGY-2, [place] Intern pager: [phone], text pages welcome

## 2018-01-06 NOTE — Progress Notes (Signed)
This RN was notified via Family Medicine that Pt no longer needed Airborne precautions. Pt will return to [facility] room 8 following CT biopsy.

## 2018-01-07 DIAGNOSIS — N183 Chronic kidney disease, stage 3 unspecified: Secondary | ICD-10-CM

## 2018-01-07 DIAGNOSIS — I10 Essential (primary) hypertension: Secondary | ICD-10-CM

## 2018-01-07 LAB — CBC
HCT: 30.4 % — ABNORMAL LOW (ref 39.0–52.0)
Hemoglobin: 9.8 g/dL — ABNORMAL LOW (ref 13.0–17.0)
MCH: 32.1 pg (ref 26.0–34.0)
MCHC: 32.2 g/dL (ref 30.0–36.0)
MCV: 99.7 fL (ref 80.0–100.0)
NRBC: 0 % (ref 0.0–0.2)
PLATELETS: 122 10*3/uL — AB (ref 150–400)
RBC: 3.05 MIL/uL — ABNORMAL LOW (ref 4.22–5.81)
RDW: 13.6 % (ref 11.5–15.5)
WBC: 2.8 10*3/uL — ABNORMAL LOW (ref 4.0–10.5)

## 2018-01-07 LAB — PROTIME-INR
INR: 2.53
Prothrombin Time: 26.9 seconds — ABNORMAL HIGH (ref 11.4–15.2)

## 2018-01-07 LAB — ACID FAST SMEAR (AFB, MYCOBACTERIA)

## 2018-01-07 LAB — BASIC METABOLIC PANEL
Anion gap: 9 (ref 5–15)
BUN: 25 mg/dL — AB (ref 8–23)
CALCIUM: 8.1 mg/dL — AB (ref 8.9–10.3)
CO2: 26 mmol/L (ref 22–32)
Chloride: 101 mmol/L (ref 98–111)
Creatinine, Ser: 2.09 mg/dL — ABNORMAL HIGH (ref 0.61–1.24)
GFR calc Af Amer: 34 mL/min — ABNORMAL LOW (ref >60)
GFR, EST NON AFRICAN AMERICAN: 30 mL/min — AB (ref >60)
GLUCOSE: 116 mg/dL — AB (ref 70–99)
Potassium: 4.4 mmol/L (ref 3.5–5.1)
Sodium: 136 mmol/L (ref 135–145)

## 2018-01-07 LAB — LIPASE, FLUID: Lipase-Fluid: 33 U/L

## 2018-01-07 LAB — HEPARIN LEVEL (UNFRACTIONATED): HEPARIN UNFRACTIONATED: 0.4 [IU]/mL (ref 0.30–0.70)

## 2018-01-07 MED ORDER — WARFARIN SODIUM 1 MG PO TABS
1.0000 mg | ORAL_TABLET | ORAL | Status: DC
  Filled 2018-01-07: qty 1

## 2018-01-07 MED ORDER — POTASSIUM CHLORIDE CRYS ER 20 MEQ PO TBCR
40.0000 meq | EXTENDED_RELEASE_TABLET | Freq: Every day | ORAL | Status: DC
  Administered 2018-01-07 – 2018-01-08 (×2): 40 meq via ORAL
  Filled 2018-01-07 (×2): qty 2

## 2018-01-07 MED ORDER — WARFARIN SODIUM 1 MG PO TABS
1.5000 mg | ORAL_TABLET | ORAL | Status: DC
  Administered 2018-01-07: 1.5 mg via ORAL
  Filled 2018-01-07: qty 1

## 2018-01-07 NOTE — Progress Notes (Signed)
FPTS Interim Progress Note  S: nurse paged about 24 hour urine collection. Collection began at about 2300 on Oct 23rd and due to miscommunication, the collection was not ended until 0930 on 10/25. Sample was sent to lab for analysis.   O: BP 103/75 (BP Location: Left Arm)   Pulse 80   Temp 98.1 F (36.7 C) (Oral)   Resp 17   Ht 5\' 11"  (1.803 m)   Wt 81.3 kg   SpO2 97%   BMI 25.00 kg/m     A/P: Will still follow up and review the labs even though collection >24hrs.  Can possibly calculate lab values based on the hours collected. If not, will have to recollect another 24 hour sample for results.  Richarda Osmond, DO 01/07/2018, 12:35 PM PGY-1, [place] Medicine Service pager [phone]

## 2018-01-07 NOTE — Care Management Important Message (Signed)
Important Message  Patient Details  Name: Mario Powell MRN: [id number] Date of Birth: 1946/01/24   Medicare Important Message Given:  Yes    [name] 01/07/2018, 2:58 PM

## 2018-01-07 NOTE — Progress Notes (Addendum)
Family Medicine Teaching Service Daily Progress Note Intern Pager: [phone]  Patient name: Mario Powell Medical record number: [id number] Date of birth: 1945-05-16 Age: 72 y.o. Gender: male  Primary Care Provider: Wilber Oliphant, MD Consultants: GI Code Status: full  Pt Overview and Major Events to Date:  10/21 admitted to fpts 10/22 paracentesis  Assessment and Plan:  Anasarca Improved after >4L taken off from paracentesis 10/22. Yesterday urine output was not recorded. Severe 3+ lower extremity pitting edema to thighs, right worse than left. Paracentesis labs suggest non-infectious and not due to cirrhosis. Will continue oral diuresis for now. GI plans to follow up outpatient. Received bone marrow biopsy yesterday via IR- results pending and oncology will follow up outpatient. Labs ordered: amylase 188, lipase 55, CEA 1.8 (normal), AFP 3.4 (normal) pending: UPEP/UIFE/light chais, TB culture and adenosine deaminase test to paracentesis fluid per oncology. 24hr urine collection was sent to lab today so will follow up on those results. Urine output was not recorded yesterday because of the collection so asked his nurse to record yesterdays output- she agreed. CT of chest: large layering Left pleural effusion, calcified pleural plaques in R lower lobe. CT of abd/pelvis: moderate volume simple intraperitoneal free fluid, no lymphadenopathy, normal splenic volume, aortic atherosclerosis. Patient will need follow up plan setting prior to discharge. Referral was sent to Dr. 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 - vital signs per floor routine - f/u GI - f/u oncology - f/u consulting radiology and chylus specialist at [facility] - f/u bone marrow biopsy results - f/u 24 urine evaluation - continue PO Lasix 39m daily - strict I/Os, daily weights  - BMP  am  Pleural plaques- Seen on CT chest- Right side. Patient has history of working in [employer]. Likely asbestos exposure.  - follow up with oncology  Acute on CKD III GFR 33 and Creatinine 1.89 on admission. 2.01 yesterday. - PO Lasix 472mdaily - avoid nephrotoxic agents - f/u repeat BMP today - home hctz and ramipril held at last dc, continue to hold - decreased patient's potassium from BID to daily. Was 3.8 yesterday.  Chronic Hyponatremia Likely secondary to fluid overload. Na+ on admission was 131. - BMP am - continue to monitor for signs of hyponatremia - add NS if becomes symptomatic (HA, N/V, confusion, etc) - fluid restriction  Mechanical Mitral Valve/ Anticoagulation- INR 2.53 today. heparin drip bridge can be discontinued today. Pharmacy helping to dose warfarin, appreciate their recs.  - discontinue heparin drip. 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 Will follow up heme/onc. - f/u heme/onc - continue to monitor for signs of exacerbation  Essential hypertension moderately controlled Systolic [id number] continue to diurese as above - vital signs per floor protocol   Paroxysmal atrial fibrillation Pharmacy for coumadin dosing. INR 2.53 today with heart rate 68-80 overnight. - appreciate pharmacy recs  Pancytopenia- Workup outpatient per heme/onc. Hgb improved to 9.8 today. B12 89 and MMA 3401 - f/u heme/onc - CBC am - continue B12 supplementation  H/O Prostate cancer S/P resection back in 2008. PSA 0.18 on 09/23 with no new symptoms. Sees urology yearly.  Hyperlipidemia Last cholesterol panel 12/2014, chol 132, HDL 36, LDL 71 - continue home rosuvastatin 4073m continue home ezetimibe  35m - repeat lipid panel - holding home niacin  GERD - continue home protonix   FEN/GI: heart  healthy diet Prophylaxis: lovenox bridge to therapeutic coumadin level  Disposition: discharge pending GI assessment  Subjective:  Overnight: no acute events  Today: patient sleeping on exam. Will re-evaluate later when patient has hearing aids in.  Objective: Temp:  [97.6 F (36.4 C)-98.7 F (37.1 C)] 98.1 F (36.7 C) (10/25 0551) Pulse Rate:  [68-80] 80 (10/25 0551) Resp:  [9-20] 17 (10/25 0551) BP: (103-152)/(65-92) 103/75 (10/25 0551) SpO2:  [97 %-100 %] 97 % (10/25 0551) Weight:  [81.3 kg] 81.3 kg (10/25 0551)   Physical Exam: General: elderly caucasian male laying in bed Cardiovascular: RRR, no m/r/g, mechanical click noted at left sternal border Respiratory: CTAB Abdomen: mildly distended, negative fluid wave Extremities: BLE 4+ pitting edema, extremely edematous feet bilaterally  Laboratory: Recent Labs  Lab 01/05/18 0419 01/06/18 0333 01/07/18 0455  WBC 2.7* 2.7* 2.8*  HGB 9.8* 10.2* 9.8*  HCT 29.0* 29.9* 30.4*  PLT 129* 136* 122*   Recent Labs  Lab 01/03/18 1930 01/04/18 0343 01/05/18 0419 01/06/18 0333  NA  --  133* 134* 132*  K  --  4.0 3.2* 3.8  CL  --  100 100 99  CO2  --  '26 26 27  ' BUN  --  23 25* 25*  CREATININE  --  2.05* 2.10* 2.02*  CALCIUM  --  8.3* 8.0* 7.9*  PROT  --  4.6*  --   --   BILITOT 1.0 0.6  --   --   ALKPHOS  --  70  --   --   ALT  --  36  --   --   AST  --  58*  --   --   GLUCOSE  --  126* 123* 128*    Imaging/Diagnostic Tests: Ct Abdomen Pelvis Wo Contrast  Result Date: 01/06/2018 CLINICAL DATA:  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 EXAM: CT CHEST, ABDOMEN AND PELVIS WITHOUT CONTRAST TECHNIQUE: Multidetector CT imaging of the chest, abdomen and pelvis was performed following the standard protocol without IV contrast. COMPARISON:  CT 12/04/2017 FINDINGS: CT CHEST FINDINGS Cardiovascular: Coronary artery calcification and aortic atherosclerotic calcification. Mediastinum/Nodes: No  axillary supraclavicular adenopathy. No mediastinal hilar adenopathy. No pericardial effusion. Esophagus normal Lungs/Pleura: Large LEFT pleural effusion layers dependently. No airspace disease. No nodularity. Airways normal. Mild bronchiectasis in lower lobes. Calcified pleural plaque in the RIGHT lower lobe (image 85/5). Musculoskeletal: No aggressive osseous lesion CT ABDOMEN PELVIS FINDINGS Hepatobiliary: No focal hepatic lesion on noncontrast exam Pancreas: No pancreatic lesion. Spleen: .  Spleen normal volume Adrenals/Urinary Tract: . adrenal glands, kidneys, ureters and bladder normal. Stomach/Bowel: Stomach, small-bowel, appendix and cecum normal. The colon and rectosigmoid colon normal. Vascular/Lymphatic: Abdominal aorta is normal caliber with atherosclerotic calcification. There is no retroperitoneal or periportal lymphadenopathy. No pelvic lymphadenopathy. Reproductive: Prostate normal Other: Moderate large volume of intraperitoneal free fluid which is simple fluid attenuation. Musculoskeletal: No aggressive osseous lesion. Schmorl's node endplate of the L1 vertebral body. IMPRESSION: Chest Impression: 1. Large layering LEFT pleural effusion. 2. Calcified pleural plaques in the RIGHT lower lobe. 3. Coronary artery calcification and Aortic Atherosclerosis (ICD10-I70.0). Abdomen / Pelvis Impression: 1. Moderate volume of simple intraperitoneal free fluid consistent with ascites. 2. No lymphadenopathy. 3. Normal splenic volume. 4.  Aortic Atherosclerosis (ICD10-I70.0). Electronically Signed   By: SSuzy BouchardM.D.   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 EXAM: CT CHEST, ABDOMEN AND PELVIS WITHOUT CONTRAST TECHNIQUE: Multidetector CT imaging of the chest, abdomen and pelvis was performed following the standard protocol without IV contrast. COMPARISON:  CT 12/04/2017 FINDINGS: CT  CHEST FINDINGS Cardiovascular: Coronary artery calcification and aortic atherosclerotic calcification. Mediastinum/Nodes: No axillary supraclavicular adenopathy. No mediastinal hilar adenopathy. No pericardial effusion. Esophagus normal Lungs/Pleura: Large LEFT pleural effusion layers dependently. No airspace disease. No nodularity. Airways normal. Mild bronchiectasis in lower lobes. Calcified pleural plaque in the RIGHT lower lobe (image 85/5). Musculoskeletal: No aggressive osseous lesion CT ABDOMEN PELVIS FINDINGS Hepatobiliary: No focal hepatic lesion on noncontrast exam Pancreas: No pancreatic lesion. Spleen: .  Spleen normal volume Adrenals/Urinary Tract: . adrenal glands, kidneys, ureters and bladder normal. Stomach/Bowel: Stomach, small-bowel, appendix and cecum normal. The colon and rectosigmoid colon normal. Vascular/Lymphatic: Abdominal aorta is normal caliber with atherosclerotic calcification. There is no retroperitoneal or periportal lymphadenopathy. No pelvic lymphadenopathy. Reproductive: Prostate normal Other: Moderate large volume of intraperitoneal free fluid which is simple fluid attenuation. Musculoskeletal: No aggressive osseous lesion. Schmorl's node endplate of the L1 vertebral body. IMPRESSION: Chest Impression: 1. Large layering LEFT pleural effusion. 2. Calcified pleural plaques in the RIGHT lower lobe. 3. Coronary artery calcification and Aortic Atherosclerosis (ICD10-I70.0). Abdomen / Pelvis Impression: 1. Moderate volume of simple intraperitoneal free fluid consistent with ascites. 2. No lymphadenopathy. 3. Normal splenic volume. 4.  Aortic Atherosclerosis (ICD10-I70.0). Electronically Signed   By: Suzy Bouchard M.D.   On: 01/06/2018 10:48   Ct Bone Marrow Biopsy & Aspiration  Result Date: 01/06/2018 INDICATION: Pancytopenia of uncertain etiology. Please perform CT-guided bone marrow biopsy for tissue diagnostic purposes. EXAM: CT-GUIDED BONE MARROW BIOPSY AND ASPIRATION  MEDICATIONS: None ANESTHESIA/SEDATION: Fentanyl 50 mcg IV; Versed 1 mg IV Sedation Time: 10 Minutes; The patient was continuously monitored during the procedure by the interventional radiology nurse under my direct supervision. COMPLICATIONS: None immediate. PROCEDURE: Informed consent was obtained from the patient following an explanation of the procedure, risks, benefits and alternatives. The patient understands, agrees and consents for the procedure. All questions were addressed. A time out was performed prior to the initiation of the procedure. The patient was positioned prone and non-contrast localization CT was performed of the pelvis to demonstrate the iliac marrow spaces. The operative site was prepped and draped in the usual sterile fashion. Under sterile conditions and local anesthesia, a 22 gauge spinal needle was utilized for procedural planning. Next, an 11 gauge coaxial bone biopsy needle was advanced into the left iliac marrow space. Needle position was confirmed with CT imaging. Initially, bone marrow aspiration was performed. Next, a bone marrow biopsy was obtained with the 11 gauge outer bone marrow device. Samples were prepared with the cytotechnologist and deemed adequate. The needle was removed intact. Hemostasis was obtained with compression and a dressing was placed. The patient tolerated the procedure well without immediate post procedural complication. IMPRESSION: Successful CT guided left iliac bone marrow aspiration and core biopsy. Electronically Signed   By: Sandi Mariscal M.D.   On: 01/06/2018 11:00    Richarda Osmond, DO 01/07/2018, 8:34 AM PGY-1, [place] Intern pager: [phone], text pages welcome

## 2018-01-07 NOTE — Progress Notes (Signed)
Mario Powell   DOB:23-Feb-1946   CM#:[id number]   AVW#:[id number]  Hematology and Oncology follow up   Subjective: Patient underwent a bone marrow biopsy yesterday, tolerated well.  His 24-hour urine was collected, but was not sent, and he is correcting another 24-hour urine again.  He was quite upset. He otherwise doing well, denies abdominal pain, or other new symptoms.  His lower extremities are still extremely swollen.  Objective:  Vitals:   01/07/18 0551 01/07/18 1329  BP: 103/75 129/79  Pulse: 80 71  Resp: 17 18  Temp: 98.1 F (36.7 C) 98.5 F (36.9 C)  SpO2: 97% 99%    Body mass index is 25 kg/m.  Intake/Output Summary (Last 24 hours) at 01/07/2018 2008 Last data filed at 01/07/2018 1720 Gross per 24 hour  Intake 662.38 ml  Output 3200 ml  Net -2537.62 ml     Sclerae unicteric  Oropharynx clear  No peripheral adenopathy  Lungs clear with decreased breath sound at lung base   Heart regular rate and rhythm  Abdomen benign, mildly distended, non-tender   MSK no focal spinal tenderness, (+) significant edema of the bilateral feet and entire lower extremity   CBG (last 3)  No results for input(s): GLUCAP in the last 72 hours.   Labs:  Lab Results  Component Value Date   WBC 2.8 (L) 01/07/2018   HGB 9.8 (L) 01/07/2018   HCT 30.4 (L) 01/07/2018   MCV 99.7 01/07/2018   PLT 122 (L) 01/07/2018   NEUTROABS 2.1 01/04/2018   CMP Latest Ref Rng & Units 01/07/2018 01/06/2018 01/05/2018  Glucose 70 - 99 mg/dL 116(H) 128(H) 123(H)  BUN 8 - 23 mg/dL 25(H) 25(H) 25(H)  Creatinine 0.61 - 1.24 mg/dL 2.09(H) 2.02(H) 2.10(H)  Sodium 135 - 145 mmol/L 136 132(L) 134(L)  Potassium 3.5 - 5.1 mmol/L 4.4 3.8 3.2(L)  Chloride 98 - 111 mmol/L 101 99 100  CO2 22 - 32 mmol/L '26 27 26  ' Calcium 8.9 - 10.3 mg/dL 8.1(L) 7.9(L) 8.0(L)  Total Protein 6.5 - 8.1 g/dL - - -  Total Bilirubin 0.3 - 1.2 mg/dL - - -  Alkaline Phos 38 - 126 U/L - - -  AST 15 - 41 U/L - - -  ALT 0 - 44 U/L - - -      Urine Studies No results for input(s): UHGB, CRYS in the last 72 hours.  Invalid input(s): UACOL, UAPR, USPG, UPH, UTP, UGL, UKET, UBIL, UNIT, UROB, [place], UEPI, UWBC, Junie Panning Wallace, [place], [place]  Basic Metabolic Panel: Recent Labs  Lab 01/03/18 1114 01/04/18 0343 01/05/18 0419 01/06/18 0333 01/07/18 0531  NA 131* 133* 134* 132* 136  K 4.2 4.0 3.2* 3.8 4.4  CL 98 100 100 99 101  CO2 '25 26 26 27 26  ' GLUCOSE 138* 126* 123* 128* 116*  BUN 23 23 25* 25* 25*  CREATININE 1.89* 2.05* 2.10* 2.02* 2.09*  CALCIUM 8.6* 8.3* 8.0* 7.9* 8.1*   GFR Estimated Creatinine Clearance: 33 mL/min (A) (by C-G formula based on SCr of 2.09 mg/dL (H)). Liver Function Tests: Recent Labs  Lab 01/03/18 1930 01/04/18 0343  AST  --  58*  ALT  --  36  ALKPHOS  --  70  BILITOT 1.0 0.6  PROT  --  4.6*  ALBUMIN  --  2.2*   Recent Labs  Lab 01/06/18 0333  LIPASE 55*  AMYLASE 188*   No results for input(s): AMMONIA in the last 168 hours. Coagulation profile Recent Labs  Lab 01/03/18 1114 01/04/18 1048 01/05/18 0420 01/06/18 0333 01/07/18 0455  INR 2.32 2.20 2.26 2.41 2.53    CBC: Recent Labs  Lab 01/03/18 1114 01/04/18 0343 01/05/18 0419 01/06/18 0333 01/07/18 0455  WBC 5.0 2.9* 2.7* 2.7* 2.8*  NEUTROABS  --  2.1  --   --   --   HGB 12.2* 9.8* 9.8* 10.2* 9.8*  HCT 37.9* 30.9* 29.0* 29.9* 30.4*  MCV 102.7* 101.0* 99.0 98.4 99.7  PLT 217 159 129* 136* 122*   Cardiac Enzymes: No results for input(s): CKTOTAL, CKMB, CKMBINDEX, TROPONINI in the last 168 hours. BNP: Invalid input(s): POCBNP CBG: No results for input(s): GLUCAP in the last 168 hours. D-Dimer No results for input(s): DDIMER in the last 72 hours. Hgb A1c No results for input(s): HGBA1C in the last 72 hours. Lipid Profile No results for input(s): CHOL, HDL, LDLCALC, TRIG, CHOLHDL, LDLDIRECT in the last 72 hours. Thyroid function studies No results for input(s): TSH, T4TOTAL, T3FREE, THYROIDAB in the last  72 hours.  Invalid input(s): FREET3 Anemia work up No results for input(s): VITAMINB12, FOLATE, FERRITIN, TIBC, IRON, RETICCTPCT in the last 72 hours. Microbiology Recent Results (from the past 240 hour(s))  Culture, body fluid-bottle     Status: None (Preliminary result)   Collection Time: 01/04/18  3:34 PM  Result Value Ref Range Status   Specimen Description PERITONEAL  Final   Special Requests BOTTLES DRAWN AEROBIC AND ANAEROBIC  Final   Culture   Final    NO GROWTH 3 DAYS Performed at [facility], [address] [place], [place] [address]    Report Status PENDING  Incomplete  Gram stain     Status: None   Collection Time: 01/04/18  3:34 PM  Result Value Ref Range Status   Specimen Description PERITONEAL  Final   Special Requests NONE  Final   Gram Stain   Final    FEW WBC PRESENT, PREDOMINANTLY PMN NO ORGANISMS SEEN Performed at [facility], [place] [address] [place], [place] [address]    Report Status 01/04/2018 FINAL  Final  Acid Fast Smear (AFB)     Status: None   Collection Time: 01/06/18  2:07 PM  Result Value Ref Range Status   AFB Specimen Processing Concentration  Final    Comment: (NOTE) Performed At: [facility] [place], [place] [id number] Rush Farmer MD HU:[id number]    Acid Fast Smear NOSMR  Final    Comment: (NOTE) The acid-fast bacilli (AFB) smear will not be performed due to the specimen source (blood) or submission of an insufficient quantity of specimen.    Source (AFB) BLOOD  Final      Studies:  Ct Bone Marrow Biopsy & Aspiration  Result Date: 01/06/2018 INDICATION: Pancytopenia of uncertain etiology. Please perform CT-guided bone marrow biopsy for tissue diagnostic purposes. EXAM: CT-GUIDED BONE MARROW BIOPSY AND ASPIRATION MEDICATIONS: None ANESTHESIA/SEDATION: Fentanyl 50 mcg IV; Versed 1 mg IV Sedation Time: 10 Minutes; The patient was continuously monitored during the procedure by the  interventional radiology nurse under my direct supervision. COMPLICATIONS: None immediate. PROCEDURE: Informed consent was obtained from the patient following an explanation of the procedure, risks, benefits and alternatives. The patient understands, agrees and consents for the procedure. All questions were addressed. A time out was performed prior to the initiation of the procedure. The patient was positioned prone and non-contrast localization CT was performed of the pelvis to demonstrate the iliac marrow spaces. The operative site was  prepped and draped in the usual sterile fashion. Under sterile conditions and local anesthesia, a 22 gauge spinal needle was utilized for procedural planning. Next, an 11 gauge coaxial bone biopsy needle was advanced into the left iliac marrow space. Needle position was confirmed with CT imaging. Initially, bone marrow aspiration was performed. Next, a bone marrow biopsy was obtained with the 11 gauge outer bone marrow device. Samples were prepared with the cytotechnologist and deemed adequate. The needle was removed intact. Hemostasis was obtained with compression and a dressing was placed. The patient tolerated the procedure well without immediate post procedural complication. IMPRESSION: Successful CT guided left iliac bone marrow aspiration and core biopsy. Electronically Signed   By: Sandi Mariscal M.D.   On: 01/06/2018 11:00    Assessment: 72 y.o.  With PMH of hypertension, coronary artery disease, mitral valve replacement with mechanical valve, atrial fibrillation, chronic kidney disease, lymphedema, recently diagnosed of gastric neuroendocrine tumor, presented with recurrent ascites, moderate left pleural effusion.  1. Recurrent chylous ascites, unclear etiology  2. Left pleural effusion  3. 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 Pancytopenia, B12 deficiency  5. CAD, AF, MV replacement on coumadin  6. Acute on chronic  kidney disease 7. Paraprotein, likley MGUS, bone marrow biopsy result pending   Recommendations -ascite cytology was negative for malignant cells on January 04, 2018, I discussed with patient. -CT scan was negative for adenopathy and no evidence of malignancy, I discussed with patient. -She has elevated based and amylase both in serum and ascites, but overall was not very high, and he does not have abdominal pain or tenderness, clinically does not support chronic pancreatitis.  Increase was unremarkable on CT scan. -BM biopsy, 24h urine test results are pending, I will f/u  -the primary team will refer him to see a specialist for his chylous ascites  -OK to discharge home after he finishes the 24h urine collection , from my hem/onc standpoint, I will f/u with him in my clinic in a few weeks. I will call his daughter when I have his BM result back next week.    Truitt Merle, MD 01/07/2018

## 2018-01-07 NOTE — Progress Notes (Addendum)
ANTICOAGULATION CONSULT NOTE - f/u consult  Pharmacy Consult for warfarin and heparin Indication: VTE prophylaxis due to prosthetic mitral heart valve.  Allergies  Allergen Reactions  . Penicillins Other (See Comments)    REACTION: UNKNOWN    Patient Measurements: Height: 5\' 11"  (180.3 cm) Weight: 179 lb 3.7 oz (81.3 kg) IBW/kg (Calculated) : 75.3  Heparin dosing weight: 86.2 kg  Vital Signs: Temp: 98.1 F (36.7 C) (10/25 0551) Temp Source: Oral (10/25 0551) BP: 103/75 (10/25 0551) Pulse Rate: 80 (10/25 0551)  Labs: Recent Labs    01/05/18 0419 01/05/18 0420  01/06/18 0333 01/06/18 2010 01/07/18 0455  HGB 9.8*  --   --  10.2*  --  9.8*  HCT 29.0*  --   --  29.9*  --  30.4*  PLT 129*  --   --  136*  --  122*  LABPROT  --  24.6*  --  25.9*  --  26.9*  INR  --  2.26  --  2.41  --  2.53  HEPARINUNFRC  --  0.29*   < > 0.47 0.35 0.40  CREATININE 2.10*  --   --  2.02*  --   --    < > = values in this interval not displayed.    Estimated Creatinine Clearance: 34.2 mL/min (A) (by C-G formula based on SCr of 2.02 mg/dL (H)).   Medical History: Past Medical History:  Diagnosis Date  . Anasarca 12/01/2017  . Atrial fibrillation ([place]) 07/12/2013   Perioperative, 2001   . CAD (coronary artery disease) of artery bypass graft 07/12/2013   Saphenous vein graft 2001   . Chronic anticoagulation 07/12/2013  . Essential hypertension 07/12/2013  . History of mitral valve replacement with mechanical valve 01/11/2013   Mechanical mitral valve 2001  Bacterial endocarditis as the cause   . HOH (hard of hearing)   . Prostate cancer ([place]) 07/12/2013     Assessment: Pt is a 42 YOM on chronic warfarin due to mitral valve replacement. Patient is s/p paracentesis, on heparin bridge to therapeutic INR.  Heparin therapeutic this AM as well as his INR (2.53). Ok to dc heparin today per MD. We will resume his home dose.  Goal of Therapy:  INR 2.5-3.5  Heparin level 0.3-0.7 Monitor  platelets by anticoagulation protocol: Yes   Plan:   Dc heparin and levels Coumadin 1mg  PO qday except 1.5mg  MWF Daily CBC/INR  Onnie Boer, PharmD, [place], AAHIVP, CPP Infectious Disease Pharmacist Pager: [phone] 01/07/2018 11:37 AM

## 2018-01-08 ENCOUNTER — Other Ambulatory Visit: Payer: Self-pay | Admitting: Family Medicine

## 2018-01-08 DIAGNOSIS — N184 Chronic kidney disease, stage 4 (severe): Secondary | ICD-10-CM

## 2018-01-08 LAB — CBC WITH DIFFERENTIAL/PLATELET
Abs Immature Granulocytes: 0.01 10*3/uL (ref 0.00–0.07)
BASOS PCT: 1 %
Basophils Absolute: 0 10*3/uL (ref 0.0–0.1)
EOS ABS: 0.2 10*3/uL (ref 0.0–0.5)
Eosinophils Relative: 6 %
HCT: 33.9 % — ABNORMAL LOW (ref 39.0–52.0)
HEMOGLOBIN: 11.2 g/dL — AB (ref 13.0–17.0)
Immature Granulocytes: 0 %
Lymphocytes Relative: 14 %
Lymphs Abs: 0.5 10*3/uL — ABNORMAL LOW (ref 0.7–4.0)
MCH: 33 pg (ref 26.0–34.0)
MCHC: 33 g/dL (ref 30.0–36.0)
MCV: 100 fL (ref 80.0–100.0)
MONO ABS: 0.5 10*3/uL (ref 0.1–1.0)
MONOS PCT: 14 %
Neutro Abs: 2.4 10*3/uL (ref 1.7–7.7)
Neutrophils Relative %: 65 %
Platelets: 127 10*3/uL — ABNORMAL LOW (ref 150–400)
RBC: 3.39 MIL/uL — ABNORMAL LOW (ref 4.22–5.81)
RDW: 13.6 % (ref 11.5–15.5)
WBC: 3.7 10*3/uL — ABNORMAL LOW (ref 4.0–10.5)
nRBC: 0 % (ref 0.0–0.2)

## 2018-01-08 LAB — COMPREHENSIVE METABOLIC PANEL
ALBUMIN: 2.4 g/dL — AB (ref 3.5–5.0)
ALK PHOS: 65 U/L (ref 38–126)
ALT: 52 U/L — ABNORMAL HIGH (ref 0–44)
ANION GAP: 6 (ref 5–15)
AST: 81 U/L — AB (ref 15–41)
BILIRUBIN TOTAL: 0.8 mg/dL (ref 0.3–1.2)
BUN: 27 mg/dL — AB (ref 8–23)
CALCIUM: 8.4 mg/dL — AB (ref 8.9–10.3)
CO2: 25 mmol/L (ref 22–32)
Chloride: 103 mmol/L (ref 98–111)
Creatinine, Ser: 2.17 mg/dL — ABNORMAL HIGH (ref 0.61–1.24)
GFR calc Af Amer: 33 mL/min — ABNORMAL LOW (ref >60)
GFR calc non Af Amer: 28 mL/min — ABNORMAL LOW (ref >60)
GLUCOSE: 117 mg/dL — AB (ref 70–99)
Potassium: 4.3 mmol/L (ref 3.5–5.1)
Sodium: 134 mmol/L — ABNORMAL LOW (ref 135–145)
TOTAL PROTEIN: 4.7 g/dL — AB (ref 6.5–8.1)

## 2018-01-08 LAB — PROTIME-INR
INR: 2.45
PROTHROMBIN TIME: 26.2 s — AB (ref 11.4–15.2)

## 2018-01-08 MED ORDER — ENOXAPARIN SODIUM 80 MG/0.8ML ~~LOC~~ SOLN
1.0000 mg/kg | Freq: Two times a day (BID) | SUBCUTANEOUS | Status: DC
  Administered 2018-01-08: 80 mg via SUBCUTANEOUS
  Filled 2018-01-08: qty 0.8

## 2018-01-08 MED ORDER — WARFARIN SODIUM 1 MG PO TABS: 1.0000 mg | ORAL_TABLET | ORAL | Status: DC

## 2018-01-08 MED ORDER — ENOXAPARIN SODIUM 80 MG/0.8ML ~~LOC~~ SOLN: 1.0000 mg/kg | Freq: Two times a day (BID) | SUBCUTANEOUS | 0 refills | Status: DC

## 2018-01-08 MED ORDER — WARFARIN SODIUM 1 MG PO TABS
1.5000 mg | ORAL_TABLET | ORAL | Status: AC
  Administered 2018-01-08: 1.5 mg via ORAL
  Filled 2018-01-08: qty 1

## 2018-01-08 MED ORDER — FUROSEMIDE 40 MG PO TABS: 40.0000 mg | ORAL_TABLET | ORAL | Status: DC

## 2018-01-08 NOTE — Progress Notes (Signed)
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  Allergies  Allergen Reactions  . Penicillins Other (See Comments)    REACTION: UNKNOWN    Patient Measurements: Height: 5\' 11"  (180.3 cm) Weight: 179 lb 3.7 oz (81.3 kg) IBW/kg (Calculated) : 75.3  Heparin dosing weight: 86.2 kg  Vital Signs: Temp: 98.1 F (36.7 C) (10/26 0532) Temp Source: Oral (10/26 0532) BP: 124/81 (10/26 0532) Pulse Rate: 75 (10/26 0532)  Labs: Recent Labs    01/06/18 0333 01/06/18 2010 01/07/18 0455 01/07/18 0531 01/08/18 0452  HGB 10.2*  --  9.8*  --  11.2*  HCT 29.9*  --  30.4*  --  33.9*  PLT 136*  --  122*  --  127*  LABPROT 25.9*  --  26.9*  --  26.2*  INR 2.41  --  2.53  --  2.45  HEPARINUNFRC 0.47 0.35 0.40  --   --   CREATININE 2.02*  --   --  2.09* 2.17*    Estimated Creatinine Clearance: 31.8 mL/min (A) (by C-G formula based on SCr of 2.17 mg/dL (H)).   Medical History: Past Medical History:  Diagnosis Date  . Anasarca 12/01/2017  . Atrial fibrillation ([place]) 07/12/2013   Perioperative, 2001   . CAD (coronary artery disease) of artery bypass graft 07/12/2013   Saphenous vein graft 2001   . Chronic anticoagulation 07/12/2013  . Essential hypertension 07/12/2013  . History of mitral valve replacement with mechanical valve 01/11/2013   Mechanical mitral valve 2001  Bacterial endocarditis as the cause   . HOH (hard of hearing)   . Prostate cancer ([place]) 07/12/2013     Assessment: Pt is a 89 YOM on chronic warfarin due to mitral valve replacement. Patient is s/p paracentesis. Plan to dc home today with lovenox bridge. His crcl~30 so MD prefers to do BID dosing. His INR dipped down slightly. We will repeat the 1.5mg  dose before resuming the home dose.    Goal of Therapy:  INR 2.5-3.5  Heparin level 0.3-0.7 Monitor platelets by anticoagulation protocol: Yes   Plan:  Lovenox  80mg  SQ q12 Coumadin 1.5mg  now then Coumadin 1mg  PO qday except 1.5mg  MWF Daily CBC/INR  Onnie Boer, PharmD, [place], AAHIVP, CPP Infectious Disease Pharmacist Pager: [phone] 01/08/2018 12:47 PM

## 2018-01-08 NOTE — Discharge Instructions (Signed)
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 You received a procedure called paracentesis to remove the fluid. You will need to check up with your PCP and some specialists to monitor your progress. Here is a summary of instructions for after leaving the hospital   Weigh yourself daily- If you gain more than 2lbs in a day or 5lbs in a week- please let your PCP know so we can schedule a repeat paracentesis to remove the fluid  Continue to take Lasix every other day  Take your warfarin with your regular home dosing- also take Lovenox injection twice per day until you see your PCP and check your INR again in therapeutic range.   Follow up with your PCP to monitor your fluid status  Follow up with heme/onc to discuss bone marrow biopsy results and other labs  Follow up with radiology for a lymphogram

## 2018-01-09 LAB — CULTURE, BODY FLUID W GRAM STAIN -BOTTLE: Culture: NO GROWTH

## 2018-01-09 LAB — CULTURE, BODY FLUID-BOTTLE

## 2018-01-10 ENCOUNTER — Telehealth: Payer: Self-pay | Admitting: Hematology

## 2018-01-10 ENCOUNTER — Other Ambulatory Visit: Payer: Medicare Other

## 2018-01-10 DIAGNOSIS — N184 Chronic kidney disease, stage 4 (severe): Secondary | ICD-10-CM

## 2018-01-10 LAB — UPEP/UIFE/LIGHT CHAINS/TP, 24-HR UR
% BETA, Urine: 32.9 %
% BETA, Urine: 49.4 %
ALPHA 1 URINE: 4.6 %
ALPHA 1 URINE: 3.1 %
ALPHA 2 UR: 10.5 %
Albumin, U: 24.4 %
Albumin, U: 28.9 %
Alpha 2, Urine: 5.5 %
FREE KAPPA LT CHAINS, UR: 133 mg/L — AB (ref 1.35–24.19)
FREE LAMBDA LT CHAINS, UR: 4.2 mg/L (ref 0.24–6.66)
FREE LAMBDA LT CHAINS, UR: 4.77 mg/L (ref 0.24–6.66)
Free Kappa Lt Chains,Ur: 154 mg/L — ABNORMAL HIGH (ref 1.35–24.19)
Free Kappa/Lambda Ratio: 31.67 — ABNORMAL HIGH (ref 2.04–10.37)
Free Kappa/Lambda Ratio: 32.29 — ABNORMAL HIGH (ref 2.04–10.37)
GAMMA GLOBULIN URINE: 27.7 %
GAMMA GLOBULIN URINE: 13.2 %
TOTAL PROTEIN, URINE-UR/DAY: 92 mg/(24.h) (ref 30–150)
TOTAL PROTEIN, URINE-UR/DAY: 490 mg/(24.h) — AB (ref 30–150)
Total Protein, Urine: 19.3 mg/dL
Total Protein, Urine: 4 mg/dL
Total Volume: 2300
Total Volume: 2540

## 2018-01-10 NOTE — Telephone Encounter (Signed)
Scheduled appt per 10/27 sch message - pt is aware of appt date and time.

## 2018-01-11 ENCOUNTER — Telehealth: Payer: Self-pay | Admitting: Family Medicine

## 2018-01-11 ENCOUNTER — Ambulatory Visit (INDEPENDENT_AMBULATORY_CARE_PROVIDER_SITE_OTHER): Payer: Medicare Other | Admitting: Pharmacist

## 2018-01-11 DIAGNOSIS — Z952 Presence of prosthetic heart valve: Secondary | ICD-10-CM | POA: Diagnosis not present

## 2018-01-11 DIAGNOSIS — Z5181 Encounter for therapeutic drug level monitoring: Secondary | ICD-10-CM | POA: Diagnosis not present

## 2018-01-11 LAB — COMPREHENSIVE METABOLIC PANEL
ALBUMIN: 3 g/dL — AB (ref 3.5–4.8)
ALK PHOS: 94 IU/L (ref 39–117)
ALT: 57 IU/L — AB (ref 0–44)
AST: 88 IU/L — AB (ref 0–40)
Albumin/Globulin Ratio: 1.3 (ref 1.2–2.2)
BUN/Creatinine Ratio: 15 (ref 10–24)
BUN: 31 mg/dL — AB (ref 8–27)
Bilirubin Total: 0.4 mg/dL (ref 0.0–1.2)
CHLORIDE: 97 mmol/L (ref 96–106)
CO2: 23 mmol/L (ref 20–29)
CREATININE: 2.07 mg/dL — AB (ref 0.76–1.27)
Calcium: 8.8 mg/dL (ref 8.6–10.2)
GFR calc Af Amer: 35 mL/min/{1.73_m2} — ABNORMAL LOW (ref >59)
GFR calc non Af Amer: 31 mL/min/{1.73_m2} — ABNORMAL LOW (ref >59)
GLUCOSE: 89 mg/dL (ref 65–99)
Globulin, Total: 2.3 g/dL (ref 1.5–4.5)
Potassium: 4.5 mmol/L (ref 3.5–5.2)
Sodium: 134 mmol/L (ref 134–144)
Total Protein: 5.3 g/dL — ABNORMAL LOW (ref 6.0–8.5)

## 2018-01-11 LAB — POCT INR: INR: 3.8 — AB (ref 2.0–3.0)

## 2018-01-11 MED ORDER — FUROSEMIDE 40 MG PO TABS: 40.0000 mg | ORAL_TABLET | ORAL | 11 refills | Status: DC

## 2018-01-11 MED ORDER — PANTOPRAZOLE SODIUM 40 MG PO TBEC: 40.0000 mg | DELAYED_RELEASE_TABLET | Freq: Every day | ORAL | 0 refills | Status: DC

## 2018-01-11 NOTE — Telephone Encounter (Signed)
See earlier documentation

## 2018-01-11 NOTE — Telephone Encounter (Signed)
Patient needs Protonix refill and Furosemide, (but not to [employer]), to [facility] on [address].

## 2018-01-11 NOTE — Telephone Encounter (Signed)
Called patient with results. During conversation, patient requested refills. Rx sent to pharmacy.

## 2018-01-11 NOTE — Telephone Encounter (Signed)
Called patient with results.  

## 2018-01-11 NOTE — Patient Instructions (Signed)
Description   Stop your Lovenox shots. Skip your Coumadin today, then continue taking 1 tablet daily except 1.5 tablets on Mondays, Wednesdays and Fridays.  Recheck INR in 2 weeks. Call when procedure is scheduled [phone]

## 2018-01-13 ENCOUNTER — Other Ambulatory Visit: Payer: Self-pay | Admitting: Hematology

## 2018-01-17 ENCOUNTER — Encounter (HOSPITAL_COMMUNITY): Payer: Self-pay | Admitting: Hematology

## 2018-01-23 ENCOUNTER — Other Ambulatory Visit: Payer: Self-pay | Admitting: Interventional Cardiology

## 2018-01-27 ENCOUNTER — Encounter: Payer: Self-pay | Admitting: Family Medicine

## 2018-01-27 ENCOUNTER — Other Ambulatory Visit: Payer: Self-pay

## 2018-01-27 ENCOUNTER — Ambulatory Visit (INDEPENDENT_AMBULATORY_CARE_PROVIDER_SITE_OTHER): Payer: Medicare Other

## 2018-01-27 ENCOUNTER — Ambulatory Visit (INDEPENDENT_AMBULATORY_CARE_PROVIDER_SITE_OTHER): Payer: Medicare Other | Admitting: Family Medicine

## 2018-01-27 VITALS — BP 118/80 | HR 98 | Temp 97.8°F | Ht 71.0 in | Wt 195.6 lb

## 2018-01-27 DIAGNOSIS — D61818 Other pancytopenia: Secondary | ICD-10-CM

## 2018-01-27 DIAGNOSIS — Z0000 Encounter for general adult medical examination without abnormal findings: Secondary | ICD-10-CM | POA: Insufficient documentation

## 2018-01-27 DIAGNOSIS — R601 Generalized edema: Secondary | ICD-10-CM

## 2018-01-27 DIAGNOSIS — J90 Pleural effusion, not elsewhere classified: Secondary | ICD-10-CM | POA: Diagnosis not present

## 2018-01-27 DIAGNOSIS — R0989 Other specified symptoms and signs involving the circulatory and respiratory systems: Secondary | ICD-10-CM

## 2018-01-27 DIAGNOSIS — Z952 Presence of prosthetic heart valve: Secondary | ICD-10-CM

## 2018-01-27 DIAGNOSIS — C7A8 Other malignant neuroendocrine tumors: Secondary | ICD-10-CM | POA: Diagnosis not present

## 2018-01-27 DIAGNOSIS — R0689 Other abnormalities of breathing: Secondary | ICD-10-CM | POA: Insufficient documentation

## 2018-01-27 DIAGNOSIS — Z5181 Encounter for therapeutic drug level monitoring: Secondary | ICD-10-CM | POA: Diagnosis not present

## 2018-01-27 LAB — POCT INR: INR: 4.4 — AB (ref 2.0–3.0)

## 2018-01-27 MED ORDER — SPIRONOLACTONE 100 MG PO TABS: 100.0000 mg | ORAL_TABLET | Freq: Every day | ORAL | 1 refills | Status: DC

## 2018-01-27 MED ORDER — ROSUVASTATIN CALCIUM 40 MG PO TABS: 40.0000 mg | ORAL_TABLET | Freq: Every day | ORAL | 0 refills | Status: DC

## 2018-01-27 MED ORDER — TORSEMIDE 20 MG PO TABS: 20.0000 mg | ORAL_TABLET | Freq: Every day | ORAL | 1 refills | Status: DC

## 2018-01-27 MED ORDER — EZETIMIBE 10 MG PO TABS: 10.0000 mg | ORAL_TABLET | Freq: Every day | ORAL | 0 refills | Status: DC

## 2018-01-27 NOTE — Assessment & Plan Note (Signed)
Patient is currently following with Dr. Annamaria Boots.  He is upset that he has not received any information or follow-up about his bone marrow biopsy.  Patient was even printouts of bone marrow biopsy results.  He is to see Dr. Burr Medico next week for follow-up and vitamin B12 injection.  Can spend more time with patient explaining results if time allows at next appointment.

## 2018-01-27 NOTE — Assessment & Plan Note (Signed)
Patient was seen by [facility] while he was in the hospital, as he missed his appointment with Dr. [name] due to admission.  He was told that he needs [facility] for repeat EGD.  Patient is unsure about going for EGD as he has read that 51% of EGDs can cause increased fluid retention.  He reports that he also showed Dr. Owens Shark this literature. 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  Explained that the tumor is slow-growing and is currently likely not causing any symptoms.  It will be managed conservatively with close follow-up with GI.  Patient is agreeable, he is simply frustrated about lack of communication about test results.  Encourage patient to make appointment for EGD.  It will be necessary to ensure that tumor was entirely removed with appropriate margins.

## 2018-01-27 NOTE — Assessment & Plan Note (Signed)
Patient has decreased breath sounds today on left lower and middle bases.  Patient was previously found with pleural effusion and refused thoracentesis.  If this is likely the cause of the decreased breath sounds.  Patient does not want to go to hospital or be admitted.  He does not complain of any shortness of breath or acute difficulties breathing.  He does not have any crackles on exam.  Patient has not had any fever and this does not seem consistent with any type of pneumonia.  Continue to follow  Rescan if patient has acute shortness of breath or worsening respiratory status  Pleural plaques also found on CT that had yet to be worked up.  Will reassess at next visit

## 2018-01-27 NOTE — Patient Instructions (Addendum)
Dear Mario Powell,   It was nice to see you today! I am sorry that the fluid has made you feel awful for the last few days. The goal of the diuretic medications is to get as much fluid out as possible. If you notice that you are not peeing as much, I want you to send a message on MyChart straight to me and let me know.   Fluid Excess  Oral medication   20mg  torsemide every morning  100mg  spironolactone every morning  Please come back on Monday to be seen by a physician   If you are not noticing increase in urine within the first six hours of taking the medication, please message.    Thank you for choosing [facility] for your primary care needs and stay well!   Best,   Dr. Zettie Cooley Resident Physician, PGY-1 [facility] [phone]    Don't forget to sign up for MyChart for instant access to your health profile, labs, orders, upcoming appointments or to contact your provider with questions. Stop at the front desk on the way out for more information about how to sign up!

## 2018-01-27 NOTE — Progress Notes (Signed)
SUBJECTIVE:  PCP: Wilber Oliphant, MD Patient ID: MRN [id number]  Date of birth: May 29, 1945  HPI Mario Powell is a 72 y.o. male who presents to clinic with chief complaint of weight gain and volume overload.   #Weight Gain/Volume excess Patient complains of excessive fluid in abdomen and legs.  He reports that this has been going on for the last week and it is driving him crazy.  If he has currently taking p.o. Lasix 40 mg every other day as prescribed at previous discharge.  He reports that he has noticed in the last couple days that he is not urinating as much despite taking the medication.   In the past, patient has been admitted for episodes of anasarca. 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   SHx  reports that he has quit smoking. He has never used smokeless tobacco. He reports that he drank alcohol. He reports that he does not use drugs.   Patient continues to work for the [employer].  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 OBJECTIVE:  BP 118/80   Pulse 98   Temp 97.8 F (36.6 C) (Oral)   Ht _0  (1.803 m)   Wt 195 lb 9.6 oz (88.7 kg)   SpO2 100%   BMI 27.28 kg/m   Physical Exam:  Gen: NAD, alert, diffusely edematous and distal arms, abdomen, lower extremities.  Nontoxic appearing  Skin: Warm and dry. No obvious rashes, lesions, or trauma. HEENT: NCAT No conjunctival pallor or injection. No scleral icterus or injection.  MMM.  Patient has Band-Aid over right nasal bone. CV: RRR.  Normal S1-S2.  Resp: Decreased breath sounds on left side posteriorly.  No wheezing rales or rhonchi.  Abd: Abdomen is firm and dull to percussion.  Patient has trace pitting edema on his abdomen.  Patient has 2+ pitting edema on his hands and forearms.  3+ pitting edema on his legs past his thighs.  He is nontender to palpation with positive bowel sounds. Psych: Cooperative with exam.  Pleasant. Makes eye contact. Speech normal. Extremities: Moves all extremities spontaneously  Neuro: CN II-XII grossly intact. No FNDs.  Back: spine symmetric w/o abnormal curvature. No TTP C/T/L spinous processes   Pertinent Labs & Imaging:  Reviewed in chart   ASSESSMENT & PLAN:  Anasarca Patient arrives today with complaints of diffuse anasarca on his distal upper extremities, abdomen and lower extremities.  He also complains that he has swelling in his scrotum as well, similar to his presentation in September for admission of anasarca.  He reports that GI has told him that he can have an outpatient paracentesis, however, does not have any more information other than this.  Reached out to [place] doc on call and asked about this information.  Outpatient paracentesis is possible, some patients have scheduled paracenteses with standing orders.  Patient is not responding to Lasix.  Patient's creatinine is currently stable around 2.0.  20 mg torsemide daily, 100 mg spironolactone daily  Can increase to twice daily   Patient to follow-up on Wednesday 11/20 for follow-up blood work and to lay eyes on patient during diuresis.  Will need to reach out to Dr. [name] to organize scheduled paracentesis for this patient.   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  Brahmbhatt due to admission.  He was told that he needs to [facility]-up with [facility] in December for repeat EGD.  Patient is unsure about going for EGD as he has read that 51% of EGDs can cause increased fluid retention.  He reports that he also showed Dr. Owens Shark this literature. 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  Explained that the tumor is slow-growing and is currently likely not causing any symptoms.  It will be managed conservatively with close follow-up with GI.  Patient is agreeable, he is simply frustrated about lack of  communication about test results.  Encourage patient to make appointment for EGD.  It will be necessary to ensure that tumor was entirely removed with appropriate margins.    Pleural effusion, left Patient has decreased breath sounds today on left lower and middle bases.  Patient was previously found with pleural effusion and refused thoracentesis.  If this is likely the cause of the decreased breath sounds.  Patient does not want to go to hospital or be admitted.  He does not complain of any shortness of breath or acute difficulties breathing.  He does not have any crackles on exam.  Patient has not had any fever and this does not seem consistent with any type of pneumonia.  Continue to follow  Rescan if patient has acute shortness of breath or worsening respiratory status  Pleural plaques also found on CT that had yet to be worked up.  Will reassess at next visit    Pancytopenia [facility]) Patient is currently following with Dr. Annamaria Boots.  He is upset that he has not received any information or follow-up about his bone marrow biopsy.  Patient was even printouts of bone marrow biopsy results.  He is to see Dr. Burr Medico next week for follow-up and vitamin B12 injection.  Can spend more time with patient explaining results if time allows at next appointment.  Healthcare maintenance Patient needs Tdap and pneumococcal vaccines at next visit.  Zettie Cooley, M.D. [place]  PGY -1 01/27/2018, 1:39 PM

## 2018-01-27 NOTE — Assessment & Plan Note (Signed)
Patient arrives today with complaints of diffuse anasarca on his distal upper extremities, abdomen and lower extremities.  He also complains that he has swelling in his scrotum as well, similar to his presentation in September for admission of anasarca.  He reports that GI has told him that he can have an outpatient paracentesis, however, does not have any more information other than this.  Reached out to [place] doc on call and asked about this information.  Outpatient paracentesis is possible, some patients have scheduled paracenteses with standing orders.  Patient is not responding to Lasix.  Patient's creatinine is currently stable around 2.0.  20 mg torsemide daily, 100 mg spironolactone daily  Can increase to twice daily   Patient to follow-up on Wednesday 11/20 for follow-up blood work and to lay eyes on patient during diuresis.  Will need to reach out to Dr. [name] to organize scheduled paracentesis for this patient.

## 2018-01-27 NOTE — Assessment & Plan Note (Signed)
Patient needs Tdap and pneumococcal vaccines at next visit.

## 2018-01-27 NOTE — Patient Instructions (Signed)
Description   Skip today's dosage of Coumadin, then start taking 1 tablet daily except 1.5 tablets on Mondays and Fridays.  Recheck INR in 10 days.  Call when procedure is scheduled [phone]

## 2018-01-31 ENCOUNTER — Encounter: Payer: Self-pay | Admitting: Family Medicine

## 2018-02-01 ENCOUNTER — Other Ambulatory Visit: Payer: Self-pay | Admitting: Family Medicine

## 2018-02-01 DIAGNOSIS — R188 Other ascites: Secondary | ICD-10-CM

## 2018-02-01 NOTE — Progress Notes (Signed)
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    12/04/2017 Imaging    12/04/2017 CT Abdomen IMPRESSION: 1. Moderate to large LEFT pleural effusion, moderate ascites and subcutaneous edema. 2. No hepatic abnormality identified on this noncontrast scan. 3.  Aortic Atherosclerosis (ICD10-I70.0).    12/06/2017 Pathology Results    12/06/2017 Surgical Pathology Diagnosis 1. Stomach, polyp(s) - WELL DIFFERENTIATED NEUROENDOCRINE TUMOR, GRADE 1. - TUMOR IS <0.1 CM OF THE CAUTERIZED EDGE. - HYPERPLASTIC POLYP WITH INTESTINAL METAPLASIA. 2. Stomach, biopsy, Random - CHRONIC GASTRITIS WITH INTESTINAL METAPLASIA. - HELICOBACTER PYLORI IMMUNOHISTOCHEMISTRY IS NEGATIVE. - NO DYSPLASIA OR MALIGNANCY. 3. Colon, biopsy, Random - BENIGN COLONIC MUCOSA. - NO ACTIVE INFLAMMATION OR EVIDENCE OF MICROSCOPIC COLITIS. - NO DYSPLASIA OR MALIGNANCY. 4. Rectum, polyp(s) - INFLAMED PROLAPSE TYPE POLYP. - NO DYSPLASIA OR MALIGNANCY.    12/06/2017 Procedure    12/06/2017 Colonoscopy Impression: - Erythematous, hemorrhagic, inflamed and petechial mucosa in the entire examined colon. Biopsied. - One 10 mm polyp in the rectum, removed with a hot snare. Resected and retrieved. Clip was placed. - Internal hemorrhoids.    12/06/2017 Procedure    12/06/2017 EGD Impression: - Z-line regular, 39 cm from the incisors. - Chronic gastritis with hemorrhage. Biopsied. - Two  gastric polyps. Resected and retrieved. - Normal duodenal bulb, first portion of the duodenum and second portion of the duodenum.    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 I saw him during his hospital admission and after paracentesis, and he was doing well. He was later discharged on 01/08/2018.    01/04/2018 Pathology Results    01/04/2018 Cytology Diagnosis PERITONEAL /ASCITIC FLUID (SPECIMEN 1 OF 1, COLLECTED ON 01/04/2018): NO MALIGNANT CELLS IDENTIFIED.    01/04/2018 Procedure    01/04/2018 IR Paracentesis IMPRESSION: Successful ultrasound-guided paracentesis yielding 4 L of peritoneal fluid.    01/06/2018 Imaging    01/06/2018 CT CAP  IMPRESSION: Chest Impression: 1. Large layering LEFT pleural effusion. 2. Calcified pleural plaques in the RIGHT lower lobe. 3. Coronary artery calcification and Aortic Atherosclerosis (ICD10-I70.0).  Abdomen / Pelvis Impression: 1. Moderate volume of simple intraperitoneal free fluid consistent with ascites. 2. No lymphadenopathy. 3. Normal splenic volume. 4.  Aortic Atherosclerosis (ICD10-I70.0).    01/06/2018 Pathology Results    01/06/2018 Surgical Pathology Diagnosis Bone Marrow, Aspirate,Biopsy, and Clot, left iliac crest - NORMOCELLULAR MARROW WITH MILD ATYPICAL PLASMACYTOSIS. - SEE COMMENT. PERIPHERAL BLOOD: - PANCYTOPENIA.    01/06/2018 Miscellaneous    01/06/2018 Cytogenetics Normal      HISTORY OF PRESENTING ILLNESS:  Mario Powell 72 y.o. male is here because of newly diagnosed neuroendocrine tumor. He initially presented to the ER on 12/01/2017 complaining of abdominal swelling, weight gain, and peripheral and scrotal edema of a month's duration. He underwent EGD on 12/06/2017 that revealed gastric polyps, and pathology confirmed a  gastric neuroendocrine tumor. He  was later discharged on 12/08/2017. Today, he is here alone at the clinic. He is hard of hearing, but despite wearing a hearing aid, he finds it difficult to hear. He denies diarrhea and constipation. He had a mitral valve replacement and is on warfarin. He states that his lower limbs have been swollen for years now. He used lasix for 20 years but didn't help alleviate the swelling.  He sees cardiologist Dr. Tamala Powell and recently started seeing Dr. Maudie Powell as his PCP.   His wife passed away and has 3 children. He used to work for [employer] and now works in Engineer, technical sales.  He is hard of hearing, wears hearing aids.  CURRENT THERAPY: B12 1022mg injection monthly   INTERVAL HISTORY Mario LACKOis a 72y.o. male who is here for follow-up. Mario Powell our last visit, he was unfortunately hospitalized for SOB on 01/03/2018. I saw him during his hospital admission and after paracentesis, and he was doing well. He was later discharged on 01/08/2018. He has followed up with his PCP Dr. KMaudie Mercuryafter his discharge.  Today, he is here alone. He is doing well, but has difficulty communicating due to hearing loss. He states that he feels bloated. He says that he is losing weight, but is trying to monitor his bloating and eating habits to maintain his weight. He noticed weight loss after paracentesis. He denies numbness or tinging of his fingertips.    MEDICAL HISTORY:  Past Medical History:  Diagnosis Date  . Anasarca 12/01/2017  . [place]rial [place] 07/12[place]   Perioperative, 2001   . CAD (coronary artery disease) of artery bypass graft 07/12/2013   Saphenous vein graft 2001   . Chronic anticoagulation 07/12/2013  . Essential hypertension 07/12/2013  . History of mitral valve replacement with mechanical valve 01/11/2013   Mechanical mitral valve 2001  Bacterial endocarditis as the cause   . HOH (hard of hearing)   . Prostate cancer (M[facility] Powell 07/12/2013    SURGICAL HISTORY: Past Surgical  History:  Procedure Laterality Date  . BIOPSY  12/06/2017   Procedure: BIOPSY;  Surgeon: BOtis Brace [facility];  [facility]  Service: Gastroenterology;;  . COLONOSCOPY WITH PROPOFOL N/A 12/06/2017   Procedure: COLONOSCOPY WITH PROPOFOL ;  Surgeon: BOtis Brace [facility];  [facility]  Service: Gastroenterology;  Laterality: N/A;  . CORONARY ARTERY BYPASS GRAFT     2001  . ESOPHAGOGASTRODUODENOSCOPY (EGD) WITH PROPOFOL N/A 12/06/2017   Procedure: ESOPHAGOGASTRODUODENOSCOPY (EGD) WITH PROPOFOL;  Surgeon: BOtis Brace [facility];  [facility]  Service: Gastroenterology;  Laterality: N/A;  . IR PARACENTESIS  12/03/2017  . IR PARACENTESIS  01/04/2018  . MITRAL VALVE REPLACEMENT  2001   Mechanical prosthesis  . POLYPECTOMY  12/06/2017   Procedure: POLYPECTOMY;  Surgeon: BOtis Brace [facility];  [facility];  Service: Gastroenterology;;    SOCIAL HISTORY: Social History   Socioeconomic History  . Marital status: Widowed    Spouse name: Not on file  . Number of children: Not on file  . Years of education: Not on file  . Highest education level: Not on file  Occupational History  . Not on file  Social Needs  . Financial resource strain: Not on file  . Food insecurity:    Worry: Not on file    Inability: Not on file  . Transportation needs:    Medical: Not on file    Non-medical: Not on file  Tobacco Use  . Smoking status: Former SResearch scientist (life sciences) . Smokeless tobacco: Never  Used  . Tobacco comment: QUIT IN 09/1982  Substance and Sexual Activity  . Alcohol use: Not Currently    Comment: NO ALCOHOL SINCE CHRISTMAS 2017  . Drug use: Never  . Sexual activity: Not on file  Lifestyle  . Physical activity:    Days per week: Not on file    Minutes per session: Not on file  . Stress: Not on file  Relationships  . Social connections:    Talks on phone: Not on file    Gets together: Not on file    Attends religious service: Not on file    Active member  of club or organization: Not on file    Attends meetings of clubs or organizations: Not on file    Relationship status: Not on file  . Intimate partner violence:    Fear of current or ex partner: Not on file    Emotionally abused: Not on file    Physically abused: Not on file    Forced sexual activity: Not on file  Other Topics Concern  . Not on file  Social History Narrative   Daughters Judson Roch and Sharyn Lull   Works for [employer], mainly at desk all day.    Performs all ADLs    FAMILY HISTORY: Family History  Problem Relation Age of Onset  . Heart attack Father   . Healthy Mother   . Healthy Sister   . Healthy Sister     ALLERGIES:  is allergic to penicillins.  MEDICATIONS:  Current Outpatient Medications  Medication Sig Dispense Refill  . Amino Acids-Protein Hydrolys (FEEDING SUPPLEMENT, PRO-STAT SUGAR FREE 64,) LIQD Take 30 mLs by mouth 3 (three) times daily with meals. 900 mL 0  . enoxaparin (LOVENOX) 80 MG/0.8ML injection Inject 0.8 mLs (80 mg total) into the skin every 12 (twelve) hours. 10 Syringe 0  . ezetimibe (ZETIA) 10 MG tablet Take 1 tablet (10 mg total) by mouth daily. Please make overdue appt for future refills. [phone]. 1st attempt. 30 tablet 0  . medium chain triglycerides (MCT OIL) oil Take 15 mLs by mouth 3 (three) times daily with meals. (Patient taking differently: Take 15 mLs by mouth at bedtime. ) 946 mL 12  . Multiple Vitamin (MULTIVITAMIN WITH MINERALS) TABS tablet Take 1 tablet by mouth daily. 90 tablet 3  . pantoprazole (PROTONIX) 40 MG tablet Take 1 tablet (40 mg total) by mouth daily. 30 tablet 0  . rosuvastatin (CRESTOR) 40 MG tablet Take 1 tablet (40 mg total) by mouth daily. Please make overdue appt for future refills. [phone]. 1st attempt. 30 tablet 0  . spironolactone (ALDACTONE) 100 MG tablet Take 1 tablet (100 mg total) by mouth daily. 30 tablet 1  . torsemide (DEMADEX) 20 MG tablet Take 1 tablet (20 mg total) by mouth daily. 30 tablet 1  .  warfarin (COUMADIN) 1 MG tablet TAKE AS DIRECTED BY ANTICOAGULATION CLINIC 120 tablet 0   No current facility-administered medications for this visit.     REVIEW OF SYSTEMS:  Constitutional: Denies fevers, chills or abnormal night sweats (+) fatigue Eyes: Denies blurriness of vision, double vision or watery eyes Ears, nose, mouth, throat, and face: Denies mucositis or sore throat (+) hard of hearing  Respiratory: Denies cough, dyspnea or wheezes Cardiovascular: Denies palpitation, chest discomfort, (+) severe b/l LE edema  Gastrointestinal:  Denies nausea, heartburn or change in bowel habits, (+) bloating  Skin: Denies abnormal skin rashes Lymphatics: Denies new lymphadenopathy or easy bruising Neurological:Denies numbness, tingling or new weaknesses Behavioral/Psych:  Mood is stable, no new changes  All other systems were reviewed with the patient and are negative.  PHYSICAL EXAMINATION:  ECOG PERFORMANCE STATUS: 1 - Symptomatic but completely ambulatory  Vitals:   02/02/18 1337  BP: 130/90  Pulse: 97  Resp: 18  Temp: 98 F (36.7 C)  SpO2: 99%   Filed Weights   02/02/18 1337  Weight: 196 lb 1.6 oz (89 kg)    GENERAL:alert, no distress and comfortable (+) hearing loss SKIN: skin color, texture, turgor are normal, no rashes or significant lesions (+) LL skin hyperpigmentation from chronic swelling, stable EYES: normal, conjunctiva are pink and non-injected, sclera clear OROPHARYNX:no exudate, no erythema and lips, buccal mucosa, and tongue normal  NECK: supple, thyroid normal size, non-tender, without nodularity LYMPH:  no palpable lymphadenopathy in the cervical, axillary or inguinal LUNGS: clear to auscultation and percussion with normal breathing effort HEART: regular rate & rhythm and no murmurs and (+) bilateral lower extremity edema, chronic ABDOMEN:abdomen soft, non-tender and normal bowel sounds  Musculoskeletal:no cyanosis of digits and no clubbing, (+) moderate to  severe bilateral pitting edema, up to knees, diffuse skin hyperpigmentation in the left lower extremity. PSYCH: alert & oriented x 3 with fluent speech NEURO: no focal motor/sensory deficits  LABORATORY DATA:  I have reviewed the data as listed CBC Latest Ref Rng & Units 02/02/2018 01/08/2018 01/07/2018  WBC 4.0 - 10.5 K/uL 4.5 3.7(L) 2.8(L)  Hemoglobin 13.0 - 17.0 g/dL 11.8(L) 11.2(L) 9.8(L)  Hematocrit 39.0 - 52.0 % 35.9(L) 33.9(L) 30.4(L)  Platelets 150 - 400 K/uL 186 127(L) 122(L)   CMP Latest Ref Rng & Units 02/02/2018 01/10/2018 01/08/2018  Glucose 70 - 99 mg/dL 106(H) 89 117(H)  BUN 8 - 23 mg/dL 38(H) 31(H) 27(H)  Creatinine 0.61 - 1.24 mg/dL 2.21(H) 2.07(H) 2.17(H)  Sodium 135 - 145 mmol/L 137 134 134(L)  Potassium 3.5 - 5.1 mmol/L 5.0 4.5 4.3  Chloride 98 - 111 mmol/L 100 97 103  CO2 22 - 32 mmol/L _0 Calcium 8.9 - 10.3 mg/dL 8.7(L) 8.8 8.4(L)  Total Protein 6.5 - 8.1 g/dL 5.6(L) 5.3(L) 4.7(L)  Total Bilirubin 0.3 - 1.2 mg/dL 0.5 0.4 0.8  Alkaline Phos 38 - 126 U/L 102 94 65  AST 15 - 41 U/L 71(H) 88(H) 81(H)  ALT 0 - 44 U/L 56(H) 57(H) 52(H)    PROCEDURES  12/06/2017 EGD Findings: The Z-line was regular and was found 39 cm from the incisors. There is no endoscopic evidence of varices in the entire esophagus. Diffuse moderate inflammation with hemorrhage characterized by congestion (edema), erosions, erythema and linear erosions was found in the entire examined stomach. Biopsies were taken with a cold forceps for histology. patient had oozing from biopsy site which stopped spontaneously. Two 10 mm semi-sessile polyps with no bleeding and stigmata of recent bleeding were found on the lesser curvature of the stomach. These polyps were removed with a hot snare. Resection and retrieval were complete. polyps were retrieved using net. The duodenal bulb, first portion of the duodenum and second portion of the duodenum were normal.  Impression: - Z-line regular, 39  cm from the incisors. - Chronic gastritis with hemorrhage. Biopsied. - Two gastric polyps. Resected and retrieved. - Normal duodenal bulb, first portion of the duodenum and second portion of the duodenum.   12/06/2017 Colonoscopy  Findings: The perianal and digital rectal examinations were normal. A scattered area of mildly erythematous, hemorrhagic, inflamed and petechial mucosa was found in the entire colon. Biopsies were  taken with a cold forceps for histology. A 10 mm polyp was found in the rectum. The polyp was semi-pedunculated. The polyp was removed with a hot snare. Resection and retrieval were complete. To prevent bleeding after the polypectomy, one hemostatic clip was successfully placed. Internal hemorrhoids were found during retroflexion. The hemorrhoids were medium-sized.  Impression: - Erythematous, hemorrhagic, inflamed and petechial mucosa in the entire examined colon. Biopsied. - One 10 mm polyp in the rectum, removed with a hot snare. Resected and retrieved. Clip was placed. - Internal hemorrhoids.   01/04/2018 IR Paracentesis IMPRESSION: Successful ultrasound-guided paracentesis yielding 4 L of peritoneal fluid.   12/03/2017 IR Paracentesis IMPRESSION: Successful ultrasound-guided diagnostic and therapeutic paracentesis yielding 5.0 liters of peritoneal fluid.   PATHOLOGY  01/06/2018 Cytogenetics   01/06/2018 Surgical Pathology Diagnosis Bone Marrow, Aspirate,Biopsy, and Clot, left iliac crest - NORMOCELLULAR MARROW WITH MILD ATYPICAL PLASMACYTOSIS. - SEE COMMENT. PERIPHERAL BLOOD: - PANCYTOPENIA. Diagnosis Note The marrow is normocellular. There is no significant dysplasia. There is a mild increase in plasma cells (10% aspirate, 5% CD138 immunohistochemistry). Morphologically there are atypical plasma cells. [name] IgG serum protein is noted. By light chain in situ hybridization the plasma cells appear polytypic, but a small  kappa-restricted population could be difficult to detect in a polyclonal background. Additionally, plasma cell neoplasms maybe patchy in nature. Correlation with FISH is recommended. GROSS AND MICROSCOPIC INFORMATION Specimen Clinical Information pancytopenia of unknown etiology, post CT guided BM Bx [rd] Source Bone Marrow, Aspirate,Biopsy, and Clot, left iliac crest Microscopic LAB DATA: CBC performed on 01/06/2018 shows: WBC 2.7 K/ul Neutrophils 70% HB 10.2 g/dl Lymphocytes 11% HCT 29.9 % Monocytes 9% MCV 98.4 fL Eosinophils 10% RDW 13.4 % Basophils 0% PLT 136 K/ul PERIPHERAL BLOOD SMEAR: There is a normocytic anemia with elliptocytes. There is no rouleaux formation. Leukocytes are mildly decreased but with unremarkable morphology. Circulating plasma cells are not seen. Platelets are mildly decreased in numbers. BONE MARROW ASPIRATE: Spicular and cellular. Erythroid precursors: Relative increase in numbers. No significant dysplasia. Granulocytic precursors: Relative decrease in numbers. No significant dysplasia. No increase in blasts. Megakaryocytes: Present and largely unremarkable. Lymphocytes/plasma cells: There is a mild increase in plasma cells (10% by manual differential counts) with a few atypical forms including binucleate and large forms. Lymphocytes are not increased. TOUCH PREPARATIONS: Similar to aspirate smears. CLOT and BIOPSY: The core biopsy has aspiration artifact. The clot section is normocellular for age (30% overall, 20-40% range). Erythroid elements are increased relative to myeloid elements. Megakaryocytes exhibit a spectrum of maturation without tight clusters. CD138 highlights a mild increase in plasma cells (5%), mostly scattered and in a perivascular distribution. The plasma cells appear polytypic by light chain in situ hybridization. CD20 and CD3 reveal only scattered B-cells and T-cells. IRON STAIN: Iron stains are performed on a bone marrow aspirate  smear and section of clot. The controls stained appropriately. Storage Iron: Minimal. Ringed Sideroblasts: Absent. ADDITIONAL DATA / TESTING: Cytogenetics, including FISH for myeloma, was ordered and will be reported separately. Specimen Table Bone Marrow count performed on 500 cells shows: Blasts: 0% Myeloid 40% Promyelocyts: 0% Myelocytes: 5% Erythroid 41% Metamyelocyts: 0% Bands: 7% Lymphocytes: 6% Neutrophils: 21% Eosinophils: 7% Plasma Cells: 10% Basophils: 0% Monocytes: 3% M:E ratio: 0.98 Gross The specimen is received in B Plus fixative and consists of a 7.0 x 5.0 x 3.0 mm aggregate of red-brown clotted blood. The specimen is entirely submitted in cassette A. Also received in B Plus fixative are multiple core and core fragments  of tan-brown bone, ranging from 0.3 x 0.2 x 0.1 cm to 0.7 cm in length x 0.2 cm in diameter. The specimen is entirely submitted in cassette B following decalcification with Immunocal. (KL:ah 01/06/18) Stain(s) Used in Diagnosis The following stain(s) were used in diagnosing the case: KAPPA-ISH, CD 3, CD 138, CD 20, LAMBDA-ISH. The control(s) stained appropriately.   01/04/2018 Cytology Diagnosis PERITONEAL /ASCITIC FLUID (SPECIMEN 1 OF 1, COLLECTED ON 01/04/2018): NO MALIGNANT CELLS IDENTIFIED. Gillie Manners Powell Pathologist, Electronic Signature (Case signed 01/06/2018) Source Peritoneal/Ascitic Fluid, (Specimen 1 of 1, collected on 01/04/2018) Gross Specimen: Received is/are 1000cc's of milky yellow fluid.(GW:gw) Prepared: # Smears: 0 # Concentration Technique Slides (i.e. ThinPrep): 1 # Cell Block: 1 Conventional Additional Studies: N/A Stain(s) used in Diagnosis The following stain(s) were used in diagnosing the case: Cell block H&E. The control(s) stained appropriately.   12/06/2017 Surgical Pathology Diagnosis 1. Stomach, polyp(s) - WELL DIFFERENTIATED NEUROENDOCRINE TUMOR, GRADE 1. - TUMOR IS <0.1 CM OF THE CAUTERIZED EDGE. -  HYPERPLASTIC POLYP WITH INTESTINAL METAPLASIA. 2. Stomach, biopsy, Random - CHRONIC GASTRITIS WITH INTESTINAL METAPLASIA. - HELICOBACTER PYLORI IMMUNOHISTOCHEMISTRY IS NEGATIVE. - NO DYSPLASIA OR MALIGNANCY. 3. Colon, biopsy, Random - BENIGN COLONIC MUCOSA. - NO ACTIVE INFLAMMATION OR EVIDENCE OF MICROSCOPIC COLITIS. - NO DYSPLASIA OR MALIGNANCY. 4. Rectum, polyp(s) - INFLAMED PROLAPSE TYPE POLYP. - NO DYSPLASIA OR MALIGNANCY. Microscopic Comment 1. The neuroendocrine tumor has a low mitotic rate <2% and Ki-67 around 2%.formation Specimen(s) Obtained: 1. Stomach, polyp(s) 2. Stomach, biopsy, Random 3. Colon, biopsy, Random 4. Rectum, polyp(s) Specimen Clinical Information 1. Abnormal CT, gastric polyps s/p snare,gastric erosions (nt) 2. r/o H pylori (nt) 3. r/o inflammation (nt) Gross 1. Received in formalin are two soft tan-red mucosal polyps which measure 0.7 and 0.8 cm in greatest dimension. The polyps are inked, sectioned and entirely submitted separately in two cassettes. 2. Received in formalin are tan, soft tissue fragments that are submitted in toto. Number: three, Size: 0.2 to 0.3 cm. 3. Received in formalin is a tan, soft tissue fragment that is submitted in toto. Size: 0.6 cm, 1 block submitted. 4. Received in formalin is a 0.7 x 0.5 x 0.3 cm soft tan mucosal polyp. The specimen is inked, sectioned and entirely submitted in one cassette. (GRP:ecj 12/06/2017) Stain(s) used in Diagnosis: The following stain(s) were used in diagnosing the case: H. Pylori IHC, KI-67-NO ACIS. The control(s) stained appropriately.  RADIOGRAPHIC STUDIES: I have personally reviewed the radiological images as listed and agreed with the findings in the report.  01/06/2018 CT CAP  IMPRESSION: Chest Impression: 1. Large layering LEFT pleural effusion. 2. Calcified pleural plaques in the RIGHT lower lobe. 3. Coronary artery calcification and Aortic Atherosclerosis (ICD10-I70.0).  Abdomen  / Pelvis Impression: 1. Moderate volume of simple intraperitoneal free fluid consistent with ascites. 2. No lymphadenopathy. 3. Normal splenic volume. 4.  Aortic Atherosclerosis (ICD10-I70.0).   12/04/2017 CT Abdomen IMPRESSION: 1. Moderate to large LEFT pleural effusion, moderate ascites and subcutaneous edema. 2. No hepatic abnormality identified on this noncontrast scan. 3.  Aortic Atherosclerosis (ICD10-I70.0).   ASSESSMENT & PLAN:  Mario Powell is a 72 y.o. male with history of HTN, lower abdominal hernia, pseudogout of right knee, mitral valve replacement, CAD, prostate cancer, chronic anticoagulation, atrial fibrillation  1. Small gastric neuroendocrine tumor, removed  -During his hospitalization for anasarca and large ascites, he underwent EGD for anemia, which showed 2 polyps in the stomach, which were removed.  Pathology reviewed low-grade neuroendocrine tumor, margin was negative but  very close.  I reviewed pathology results with him. -He had a CT abdomen pelvis without contrast, showed no evidence of enlarged lymph nodes or metastasis. -This is likely a very small, early stage neuroendocrine tumor which has been removed endoscopy.  I do not think she needs partial gastrostomy, will discuss her case in our GI tumor board.  Due to the close margin, he needs close follow-up.  He will follow-up with GI Dr. [name]  -Given today very low risk of metastasis, he may not need surveillance CT scans. -will f/u clinically  -He is scheduled to f/u with GI Dr. [name] in a few weeks and repeat endoscopy   2. Anemia, transit leukopenia and thrombocytopenia, secondary to B12 deficiency  -When he was hospitalized recently, he was found to have pancytopenia.  After hospital discharge, his repeat a CBC showed normal WBC and platelet count.  He has persistent mild to moderate anemia. -his work up showed severe B12 deficiency (89pg/ml) on 12/23/2017 -I have started him on B12 injection  in 12/2017, will continue monthly for now -will check intrinsic factor on next lab, if negative, will switch to oral B12 supplement -Labs reviewed, CBC Hg 11.8. CMP and MM panel pending today. -I explained to him that he needs Vitamin B 12 injections. He seems to still not understand why he is taking the injections. I offered talking to his daughter to facilitate communication, but he refused. He finally agreed to take Vitamin B 12 injections, and I explained switching to oral Vitamin B 12 if the future if possible. He understands and agrees.  -f/u in 3 months   3. MGUS -During the work-up for his pancytopenia, SPEP and immunofixation showed low level M protein, IgG type -Due to her recurrent ascites, to rule out malignancy, I did bone marrow biopsy, which showed 10% plasma cell, cytogenetics and FISH was negative, no evidence of multiple myeloma. I explained this to him in detail and gave him a copy of the biopsy and cytogenetics reports  -will continue monitor his SPEP/IFE and light chain levels every 3-6 months   4. HTN, A-Fib -He had a mitral valve replacement and is on coumadin.   5. CKD Stage III -I suspect his CKD might be a contributing factor to his anemia  6. 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   All questions were answered. The patient knows to call the clinic with any problems, questions or concerns. I spent 20 minutes counseling the patient face to face. The total time spent in the appointment was 25 minutes and more than 50% was on  counseling.  Dierdre Searles Dweik am acting as scribe for Dr. Truitt Merle.  I have reviewed the above documentation for accuracy and completeness, and I agree with the above.      Truitt Merle, Powell 02/02/2018

## 2018-02-01 NOTE — Progress Notes (Signed)
Care coordination note  Abdominal ascites Patient was seen at PCP on 01/27/2018 and was noted to have diffuse anasarca.  Patient's weight had been increasing since his last admission to the hospital, where he had 4-1/2 L drawn from his abdomen.  Prior to that, in September, he had 5 L drawn from his abdomen.  Patient reports that he was "miserable" and wanted the fluid off as fast as possible.  His diuretic regimen was switched to 20 mg torsemide and 100 mg spironolactone daily.  He was unable to return for labs due to work.  Patient reports that he is responding well to this new medication.  Patient has been attempting to navigate the system for outpatient paracentesis for his abdominal ascites.  Patient is on Coumadin for mitral valve replacement and follows with Coumadin clinic at cardiology. Contacted IR at [facility] today asking about an INR and procedures.  Was told that patient could receive procedure with INR at goal for mitral valve.  He is scheduled to go in for paracentesis on Friday, November 22 at 9 AM.  Patient wanted to be seen as soon as possible.  Plan:  11/20 BMP for creatinine.  Spot INR check in preparation for paracentesis procedure at the end of the week.  Plan for diuretics pending kidney function on 11/20  11/22 Zacarias Pontes for outpatient paracentesis with radiology  Patient has not yet followed up with Dr. [name] at [place].  Patient advised to make follow-up appointment with them as he will need 48-month follow-up EGD EGD in December.  Patient will be referred to nephrology during tomorrow's visit.  He does not currently follow with a nephrologist.  A referral was ordered on 12/20/2017.  Follow up with Kennyth Lose in clinic about referrals tomorrow.    Zettie Cooley, M.D. 02/01/2018, 3:36 PM PGY-1, [place]

## 2018-02-02 ENCOUNTER — Other Ambulatory Visit: Payer: Self-pay

## 2018-02-02 ENCOUNTER — Ambulatory Visit (INDEPENDENT_AMBULATORY_CARE_PROVIDER_SITE_OTHER): Payer: Medicare Other | Admitting: Family Medicine

## 2018-02-02 ENCOUNTER — Inpatient Hospital Stay (HOSPITAL_BASED_OUTPATIENT_CLINIC_OR_DEPARTMENT_OTHER): Payer: Medicare Other | Admitting: Hematology

## 2018-02-02 ENCOUNTER — Encounter: Payer: Self-pay | Admitting: Hematology

## 2018-02-02 ENCOUNTER — Other Ambulatory Visit: Payer: Self-pay | Admitting: Hematology

## 2018-02-02 ENCOUNTER — Encounter: Payer: Self-pay | Admitting: Family Medicine

## 2018-02-02 ENCOUNTER — Inpatient Hospital Stay: Payer: Medicare Other

## 2018-02-02 ENCOUNTER — Telehealth: Payer: Self-pay | Admitting: Pharmacist

## 2018-02-02 ENCOUNTER — Inpatient Hospital Stay: Payer: Medicare Other | Attending: Hematology and Oncology

## 2018-02-02 VITALS — BP 130/90 | HR 97 | Temp 98.0°F | Resp 18 | Ht 71.0 in | Wt 196.1 lb

## 2018-02-02 DIAGNOSIS — I2510 Atherosclerotic heart disease of native coronary artery without angina pectoris: Secondary | ICD-10-CM | POA: Diagnosis not present

## 2018-02-02 DIAGNOSIS — N183 Chronic kidney disease, stage 3 (moderate): Secondary | ICD-10-CM | POA: Diagnosis not present

## 2018-02-02 DIAGNOSIS — D531 Other megaloblastic anemias, not elsewhere classified: Secondary | ICD-10-CM

## 2018-02-02 DIAGNOSIS — Z952 Presence of prosthetic heart valve: Secondary | ICD-10-CM | POA: Diagnosis not present

## 2018-02-02 DIAGNOSIS — D6959 Other secondary thrombocytopenia: Secondary | ICD-10-CM | POA: Diagnosis not present

## 2018-02-02 DIAGNOSIS — I1310 Hypertensive heart and chronic kidney disease without heart failure, with stage 1 through stage 4 chronic kidney disease, or unspecified chronic kidney disease: Secondary | ICD-10-CM | POA: Diagnosis not present

## 2018-02-02 DIAGNOSIS — D649 Anemia, unspecified: Secondary | ICD-10-CM

## 2018-02-02 DIAGNOSIS — Z7901 Long term (current) use of anticoagulants: Secondary | ICD-10-CM | POA: Diagnosis present

## 2018-02-02 DIAGNOSIS — E538 Deficiency of other specified B group vitamins: Secondary | ICD-10-CM | POA: Diagnosis not present

## 2018-02-02 DIAGNOSIS — D472 Monoclonal gammopathy: Secondary | ICD-10-CM | POA: Diagnosis not present

## 2018-02-02 DIAGNOSIS — D3A8 Other benign neuroendocrine tumors: Secondary | ICD-10-CM

## 2018-02-02 DIAGNOSIS — R601 Generalized edema: Secondary | ICD-10-CM

## 2018-02-02 DIAGNOSIS — I4891 Unspecified atrial fibrillation: Secondary | ICD-10-CM

## 2018-02-02 DIAGNOSIS — Z5181 Encounter for therapeutic drug level monitoring: Secondary | ICD-10-CM

## 2018-02-02 DIAGNOSIS — D72819 Decreased white blood cell count, unspecified: Secondary | ICD-10-CM | POA: Diagnosis not present

## 2018-02-02 LAB — CMP (CANCER CENTER ONLY)
ALT: 56 U/L — ABNORMAL HIGH (ref 0–44)
ANION GAP: 6 (ref 5–15)
AST: 71 U/L — AB (ref 15–41)
Albumin: 2.4 g/dL — ABNORMAL LOW (ref 3.5–5.0)
Alkaline Phosphatase: 102 U/L (ref 38–126)
BUN: 38 mg/dL — AB (ref 8–23)
CALCIUM: 8.7 mg/dL — AB (ref 8.9–10.3)
CO2: 31 mmol/L (ref 22–32)
Chloride: 100 mmol/L (ref 98–111)
Creatinine: 2.21 mg/dL — ABNORMAL HIGH (ref 0.61–1.24)
GFR, Est AFR Am: 32 mL/min — ABNORMAL LOW (ref >60)
GFR, Estimated: 28 mL/min — ABNORMAL LOW (ref >60)
Glucose, Bld: 106 mg/dL — ABNORMAL HIGH (ref 70–99)
Potassium: 5 mmol/L (ref 3.5–5.1)
SODIUM: 137 mmol/L (ref 135–145)
TOTAL PROTEIN: 5.6 g/dL — AB (ref 6.5–8.1)
Total Bilirubin: 0.5 mg/dL (ref 0.3–1.2)

## 2018-02-02 LAB — CBC WITH DIFFERENTIAL (CANCER CENTER ONLY)
Abs Immature Granulocytes: 0.01 10*3/uL (ref 0.00–0.07)
Basophils Absolute: 0 10*3/uL (ref 0.0–0.1)
Basophils Relative: 1 %
EOS ABS: 0.1 10*3/uL (ref 0.0–0.5)
EOS PCT: 1 %
HCT: 35.9 % — ABNORMAL LOW (ref 39.0–52.0)
Hemoglobin: 11.8 g/dL — ABNORMAL LOW (ref 13.0–17.0)
Immature Granulocytes: 0 %
Lymphocytes Relative: 6 %
Lymphs Abs: 0.3 10*3/uL — ABNORMAL LOW (ref 0.7–4.0)
MCH: 32.2 pg (ref 26.0–34.0)
MCHC: 32.9 g/dL (ref 30.0–36.0)
MCV: 97.8 fL (ref 80.0–100.0)
MONO ABS: 0.5 10*3/uL (ref 0.1–1.0)
Monocytes Relative: 11 %
NEUTROS PCT: 81 %
Neutro Abs: 3.7 10*3/uL (ref 1.7–7.7)
PLATELETS: 186 10*3/uL (ref 150–400)
RBC: 3.67 MIL/uL — ABNORMAL LOW (ref 4.22–5.81)
RDW: 13.5 % (ref 11.5–15.5)
WBC Count: 4.5 10*3/uL (ref 4.0–10.5)
nRBC: 0 % (ref 0.0–0.2)

## 2018-02-02 LAB — POCT INR: INR: 4.5 — AB (ref 2.0–3.0)

## 2018-02-02 MED ORDER — CYANOCOBALAMIN 1000 MCG/ML IJ SOLN
1000.0000 ug | Freq: Once | INTRAMUSCULAR | Status: AC
  Administered 2018-02-02: 1000 ug via INTRAMUSCULAR

## 2018-02-02 NOTE — Assessment & Plan Note (Signed)
Patient started on torsemide and spironolactone last week. Patient responding well, reports losing about an average of 1 lb per day. His abdomen remains tight and uncomfortable. An o/p paracentesis has been scheduled for him on Friday 11/22. Creatinine Is 1.21 today, only a mild increase from previous study. May be likely to do repeat paracenteses as we search for cause of ascites.   Continue diuresis.   Reassess after o/p paracentesis.   Goal is to maintain weight with serial paracenteses and adequate diuresis. Patient's net goal is to go to the lymphedema clinic to help with his legs.

## 2018-02-02 NOTE — Patient Instructions (Signed)
Dear Mario Powell,   Paracentesis Appointment:  Friday, November 22 at 9 am [place] in at admissions  Directions:  Do NOT take your coumadin on Thursday OR Friday of this week.  You can resume your schedule on Saturday.   Fluid excess:  Continue to take torsemide and spironolactone as prescribed.    Best,   Dr. Zettie Cooley Resident Physician, PGY-1 [facility] [phone]    Don't forget to sign up for MyChart for instant access to your health profile, labs, orders, upcoming appointments or to contact your provider with questions. Stop at the front desk on the way out for more information about how to sign up!

## 2018-02-02 NOTE — Assessment & Plan Note (Signed)
Patient is having procedure on 02/04/18. Paracentesis to pull ascitic fluid. Goal is for INR to be around 2.5-3.5. Patient's INR today is 4.5. Spoke to coumadin clinic for care coordination. HOLDING Thursday and Friday dose for this week to help decrease INR.   Increase green intake   No coumadin on tomorrow or the morning of paracentesis.   Can continue medication coumadin on Saturday as previously dosed.

## 2018-02-02 NOTE — Telephone Encounter (Signed)
Dr. Julianne Rice office called as pt is scheduled for paracentesis on Friday. Pts INR today is 4.5 and he has already taken dose today. They plan to skip warfarin tomorrow and have him take dose after procedure on Friday. Ideally they would like INR at 2.5-3.5 for procedure. Advised that pt should eat something green/with vitamin K to help INR come down in time for procedure on Friday. Will keep INR recheck as scheduled for Monday to recheck INR.

## 2018-02-02 NOTE — Progress Notes (Signed)
SUBJECTIVE:  PCP: Wilber Oliphant, MD Patient ID: MRN [id number]  Date of birth: [birth year]  HPI Mario Powell is a 72 y.o. male who presents to clinic for new diuresis medication follow up, INR for procedure. Patient was started on torsemide and spironolactone. He reports responding very well to it. On average, he has diuresed about a pound per day. He was told to hold the lasix at this time. Creatinine today is 2.21 (from 2.07 in 01/10/18).Patient is receiving paracentesis for tense abdomen.   Review of Symptoms: See HPI  HISTORY Medications & Allergies: Reviewed with patient and updated in EMR as appropriate.   PMHx:   Past Surgical History:  Procedure Laterality Date  . BIOPSY  12/06/2017   Procedure: BIOPSY;  Surgeon: Otis Brace, MD;  Location: [facility];  Service: Gastroenterology;;  . COLONOSCOPY WITH PROPOFOL N/A 12/06/2017   Procedure: COLONOSCOPY WITH PROPOFOL ;  Surgeon: Otis Brace, MD;  Location: [place];  Service: Gastroenterology;  Laterality: N/A;  . CORONARY ARTERY BYPASS GRAFT     2001  . ESOPHAGOGASTRODUODENOSCOPY (EGD) WITH PROPOFOL N/A 12/06/2017   Procedure: ESOPHAGOGASTRODUODENOSCOPY (EGD) WITH PROPOFOL;  Surgeon: Otis Brace, MD;  Location: [place];  Service: Gastroenterology;  Laterality: N/A;  . IR PARACENTESIS  12/03/2017  . IR PARACENTESIS  01/04/2018  . MITRAL VALVE REPLACEMENT  2001   Mechanical prosthesis  . POLYPECTOMY  12/06/2017   Procedure: POLYPECTOMY;  Surgeon: Otis Brace, MD;  Location: [facility];  Service: Gastroenterology;;   SHx  reports that he has quit smoking. He has never used smokeless tobacco. He reports that he drank alcohol. He reports that he does not use drugs. 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   OBJECTIVE:  BP 116/62   Pulse 75   Temp 97.8 F (36.6 C) (Oral)   Ht 5\' 11"  (1.803 m)   Wt 195 lb 9.6 oz (88.7 kg)   SpO2 98%   BMI 27.28 kg/m   Physical Exam:  Gen: NAD, alert,  non-toxic, well-appearing, sitting comfortably, generally edematous at distal extremities, thinner in proximal. Skin: Warm and dry HEENT: NCAT.  MMM.  CV: RRR.  Normal S1-S2. No BLEE. Resp: CTAB. No increased WOB Abd: Distended and tense. Positive fluid wave. Dull to percussion throughout. Marland Kitchen  Pos bowel soundsMild pitting on adbomen. No rebound, no guarding  Extremities: moves extremities spontaneously. Warm and well perfused. 3+ BLEE to the knees.   Pertinent Labs & Imaging:  Reviewed in chart   ASSESSMENT & PLAN:  Anasarca Patient started on torsemide and spironolactone last week. Patient responding well, reports losing about an average of 1 lb per day. His abdomen remains tight and uncomfortable. An o/p paracentesis has been scheduled for him on Friday 11/22. Creatinine Is 1.21 today, only a mild increase from previous study. May be likely to do repeat paracenteses as we search for cause of ascites.   Continue diuresis.   Reassess after o/p paracentesis.   Goal is to maintain weight with serial paracenteses and adequate diuresis. Patient's net goal is to go to the lymphedema clinic to help with his legs.   Anticoagulated Patient is having procedure on 02/04/18. Paracentesis to pull ascitic fluid. Goal is for INR to be around 2.5-3.5. Patient's INR today is 4.5. Spoke to coumadin clinic for care coordination. HOLDING Thursday and Friday dose for this week to help decrease INR.   Increase green intake   No coumadin on tomorrow or the morning of paracentesis.   Can continue  medication coumadin on Saturday as previously dosed.    Patient's to do list:   Call GI for EGD appointment   Lymphedema clinic  [facility] per Dr. Owens Shark (lymphatic study)  Find cause ascites, improve fluid burden  Zettie Cooley, M.D. [place]  PGY -1 02/02/2018, 5:59 PM

## 2018-02-04 ENCOUNTER — Encounter (HOSPITAL_COMMUNITY): Payer: Self-pay | Admitting: Physician Assistant

## 2018-02-04 ENCOUNTER — Ambulatory Visit (HOSPITAL_COMMUNITY)
Admission: RE | Admit: 2018-02-04 | Discharge: 2018-02-04 | Disposition: A | Discharge status: Home / Self Care | Payer: Medicare Other | Source: Ambulatory Visit | Attending: Family Medicine | Admitting: Family Medicine

## 2018-02-04 DIAGNOSIS — R188 Other ascites: Secondary | ICD-10-CM | POA: Diagnosis not present

## 2018-02-04 HISTORY — PX: IR PARACENTESIS: IMG2679

## 2018-02-04 MED ORDER — LIDOCAINE HCL 1 % IJ SOLN
INTRAMUSCULAR | Status: AC
  Filled 2018-02-04: qty 20

## 2018-02-04 MED ORDER — LIDOCAINE HCL 1 % IJ SOLN
INTRAMUSCULAR | Status: DC | PRN
  Administered 2018-02-04: 10 mL

## 2018-02-04 NOTE — Procedures (Signed)
PROCEDURE SUMMARY:  Successful US guided paracentesis from right lateral abdomen.  Yielded 5 L of chylous ascites.  No immediate complications.  Patient tolerated well.   Lavilla Delamora S Chelby Salata PA-C 02/04/2018 10:26 AM

## 2018-02-05 ENCOUNTER — Encounter: Payer: Self-pay | Admitting: Family Medicine

## 2018-02-07 ENCOUNTER — Ambulatory Visit (INDEPENDENT_AMBULATORY_CARE_PROVIDER_SITE_OTHER): Payer: Medicare Other | Admitting: *Deleted

## 2018-02-07 DIAGNOSIS — Z952 Presence of prosthetic heart valve: Secondary | ICD-10-CM | POA: Diagnosis not present

## 2018-02-07 DIAGNOSIS — Z5181 Encounter for therapeutic drug level monitoring: Secondary | ICD-10-CM

## 2018-02-07 LAB — POCT INR: INR: 3.4 — AB (ref 2.0–3.0)

## 2018-02-07 NOTE — Patient Instructions (Signed)
Description   Start taking 1/2 tablet daily except 1 tablet on Sundays and Thursday. Recheck INR in 1 week.  Call when procedure is scheduled [phone]

## 2018-02-11 ENCOUNTER — Encounter: Payer: Self-pay | Admitting: Family Medicine

## 2018-02-15 ENCOUNTER — Other Ambulatory Visit: Payer: Self-pay | Admitting: Family Medicine

## 2018-02-15 MED ORDER — PANTOPRAZOLE SODIUM 40 MG PO TBEC: 40.0000 mg | DELAYED_RELEASE_TABLET | Freq: Every day | ORAL | 3 refills | Status: DC

## 2018-02-16 ENCOUNTER — Telehealth: Payer: Self-pay | Admitting: *Deleted

## 2018-02-16 ENCOUNTER — Ambulatory Visit (INDEPENDENT_AMBULATORY_CARE_PROVIDER_SITE_OTHER): Payer: Medicare Other | Admitting: Pharmacist

## 2018-02-16 ENCOUNTER — Other Ambulatory Visit: Payer: Self-pay | Admitting: Gastroenterology

## 2018-02-16 DIAGNOSIS — Z952 Presence of prosthetic heart valve: Secondary | ICD-10-CM

## 2018-02-16 DIAGNOSIS — Z5181 Encounter for therapeutic drug level monitoring: Secondary | ICD-10-CM | POA: Diagnosis not present

## 2018-02-16 LAB — POCT INR: INR: 2.1 (ref 2.0–3.0)

## 2018-02-16 NOTE — Patient Instructions (Signed)
Description   Take 1 tablet today and 1.5 tablets tomorrow, then start taking 1/2 tablet daily except 1 tablet on Sundays, Tuesdays and Thursday. Recheck INR in 1 week. Call when procedure is scheduled [phone]

## 2018-02-16 NOTE — Telephone Encounter (Signed)
   [facility] Pre-operative Risk Assessment    Request for surgical clearance:  1. What type of surgery is being performed? COLONOSCOPY   2. When is this surgery scheduled?  03/04/18   3. What type of clearance is required (medical clearance vs. Pharmacy clearance to hold med vs. Both)?  PHARMACY  4. Are there any medications that need to be held prior to surgery and how long? COUMADIN   5. Practice name and name of physician performing surgery?  [facility]. BRAHMBHATT   6. What is your office phone number [phone]    7.   What is your office fax number [phone]  8.   Anesthesia type (None, local, MAC, general) ?  N/A   Mario Powell 02/16/2018, 3:10 PM  _________________________________________________________________   (provider comments below)

## 2018-02-17 ENCOUNTER — Encounter: Payer: Self-pay | Admitting: Family Medicine

## 2018-02-17 NOTE — Telephone Encounter (Signed)
Pt will require Lovenox bridge in order to hold warfarin for 5 days prior to procedure to to history of mechanical MVR. Will coordinate this at next INR check on 12/13.

## 2018-02-17 NOTE — Telephone Encounter (Signed)
   [facility] Pre-operative Risk Assessment    Request for surgical clearance:  1. What type of surgery is being performed?  Endoscopy, for neuroendocrine neoplasm of stomach   2. When is this surgery scheduled? 03/04/18   3. What type of clearance is required (medical clearance vs. Pharmacy clearance to hold med vs. Both)?  BOTH  4. Are there any medications that need to be held prior to surgery and how long? Coumadin   5. Practice name and name of physician performing surgery? [facility], Dr. Otis Brace   6. What is your office phone number [phone]    7.   What is your office fax number  [phone]  8.   Anesthesia type (None, local, MAC, general) ?  Unknown   Mario Powell 02/17/2018, 12:13 PM  __________________________________

## 2018-02-18 ENCOUNTER — Telehealth: Payer: Self-pay | Admitting: *Deleted

## 2018-02-18 NOTE — Telephone Encounter (Signed)
Contacted pt and rescheduled appointment due to another appointment on same day just a few minutes apart. Katharina Caper, Chrystine Frogge D, [place]

## 2018-02-19 LAB — ACID FAST CULTURE WITH REFLEXED SENSITIVITIES (MYCOBACTERIA): Acid Fast Culture: NEGATIVE

## 2018-02-19 LAB — ACID FAST CULTURE WITH REFLEXED SENSITIVITIES

## 2018-02-22 ENCOUNTER — Other Ambulatory Visit: Payer: Self-pay

## 2018-02-22 MED ORDER — ROSUVASTATIN CALCIUM 40 MG PO TABS: 40.0000 mg | ORAL_TABLET | Freq: Every day | ORAL | 0 refills | Status: DC

## 2018-02-22 MED ORDER — EZETIMIBE 10 MG PO TABS: 10.0000 mg | ORAL_TABLET | Freq: Every day | ORAL | 0 refills | Status: DC

## 2018-02-25 ENCOUNTER — Ambulatory Visit (INDEPENDENT_AMBULATORY_CARE_PROVIDER_SITE_OTHER): Payer: Medicare Other

## 2018-02-25 ENCOUNTER — Other Ambulatory Visit: Payer: Self-pay | Admitting: Interventional Cardiology

## 2018-02-25 DIAGNOSIS — Z5181 Encounter for therapeutic drug level monitoring: Secondary | ICD-10-CM | POA: Diagnosis not present

## 2018-02-25 DIAGNOSIS — Z952 Presence of prosthetic heart valve: Secondary | ICD-10-CM

## 2018-02-25 LAB — POCT INR: INR: 2 (ref 2.0–3.0)

## 2018-02-25 MED ORDER — ENOXAPARIN SODIUM 80 MG/0.8ML ~~LOC~~ SOLN: 80.0000 mg | Freq: Two times a day (BID) | SUBCUTANEOUS | 0 refills | Status: DC

## 2018-02-25 NOTE — Patient Instructions (Addendum)
Description   Take 1 tablet today, then start taking 1 tablet daily except 1/2 tablet on Mondays, Wednesdays, and Fridays. Take your last dosage of Warfarin on 02/26/18 prior to endoscopy on 03/04/18.  Call when procedure is scheduled [phone]    02/26/18: Last dose of Coumadin.  02/27/18: No Coumadin or Lovenox.  02/28/18: Inject Lovenox 80mg  in the fatty abdominal tissue at least 2 inches from the belly button twice a day about 12 hours apart, 8am and 8pm rotate sites. No Coumadin.  03/01/18: Inject Lovenox in the fatty tissue every 12 hours, 8am and 8pm. No Coumadin.  03/02/18: Inject Lovenox in the fatty tissue every 12 hours, 8am and 8pm. No Coumadin.  03/03/18: Inject Lovenox in the fatty tissue in the morning at 8 am (No PM dose). No Coumadin.  03/04/18: Procedure Day - No Lovenox - Resume Coumadin in the evening or as directed by doctor (take an extra half tablet with usual dose for 2 days then resume normal dose).  03/05/18: Resume Lovenox inject in the fatty tissue every 12 hours and take Coumadin.  03/06/18: Inject Lovenox in the fatty tissue every 12 hours and take Coumadin.  03/07/18: Inject Lovenox in the fatty tissue every 12 hours and take Coumadin.  03/08/18: Inject Lovenox in the fatty tissue every 12 hours and take Coumadin.  03/09/18: Inject Lovenox in the fatty tissue every 12 hours and take Coumadin.  03/10/18:Inject Lovenox in the fatty tissue every 12 hours and take Coumadin  03/11/18: Coumadin appt to check INR.

## 2018-03-04 ENCOUNTER — Encounter (HOSPITAL_COMMUNITY)
Admission: RE | Disposition: A | Discharge status: Home / Self Care | Payer: Self-pay | Source: Home / Self Care | Attending: Gastroenterology

## 2018-03-04 ENCOUNTER — Ambulatory Visit (HOSPITAL_COMMUNITY): Payer: Medicare Other | Admitting: Certified Registered Nurse Anesthetist

## 2018-03-04 ENCOUNTER — Other Ambulatory Visit: Payer: Self-pay

## 2018-03-04 ENCOUNTER — Ambulatory Visit: Payer: Medicare Other | Admitting: Family Medicine

## 2018-03-04 ENCOUNTER — Ambulatory Visit (HOSPITAL_COMMUNITY)
Admission: RE | Admit: 2018-03-04 | Discharge: 2018-03-04 | Disposition: A | Discharge status: Home / Self Care | Payer: Medicare Other | Attending: Gastroenterology | Admitting: Gastroenterology

## 2018-03-04 DIAGNOSIS — Z87891 Personal history of nicotine dependence: Secondary | ICD-10-CM | POA: Diagnosis not present

## 2018-03-04 DIAGNOSIS — C7A092 Malignant carcinoid tumor of the stomach: Secondary | ICD-10-CM | POA: Insufficient documentation

## 2018-03-04 DIAGNOSIS — K317 Polyp of stomach and duodenum: Secondary | ICD-10-CM | POA: Insufficient documentation

## 2018-03-04 DIAGNOSIS — I10 Essential (primary) hypertension: Secondary | ICD-10-CM | POA: Diagnosis not present

## 2018-03-04 DIAGNOSIS — Z79899 Other long term (current) drug therapy: Secondary | ICD-10-CM | POA: Insufficient documentation

## 2018-03-04 DIAGNOSIS — D649 Anemia, unspecified: Secondary | ICD-10-CM | POA: Diagnosis not present

## 2018-03-04 DIAGNOSIS — I2510 Atherosclerotic heart disease of native coronary artery without angina pectoris: Secondary | ICD-10-CM | POA: Diagnosis not present

## 2018-03-04 DIAGNOSIS — Z951 Presence of aortocoronary bypass graft: Secondary | ICD-10-CM | POA: Insufficient documentation

## 2018-03-04 HISTORY — PX: POLYPECTOMY: SHX5525

## 2018-03-04 HISTORY — PX: BIOPSY: SHX5522

## 2018-03-04 HISTORY — PX: ESOPHAGOGASTRODUODENOSCOPY (EGD) WITH PROPOFOL: SHX5813

## 2018-03-04 SURGERY — ESOPHAGOGASTRODUODENOSCOPY (EGD) WITH PROPOFOL
Anesthesia: Monitor Anesthesia Care

## 2018-03-04 MED ORDER — PROPOFOL 500 MG/50ML IV EMUL
INTRAVENOUS | Status: DC | PRN
  Administered 2018-03-04: 75 ug/kg/min via INTRAVENOUS

## 2018-03-04 MED ORDER — PROPOFOL 10 MG/ML IV BOLUS
INTRAVENOUS | Status: DC | PRN
  Administered 2018-03-04 (×2): 20 mg via INTRAVENOUS
  Administered 2018-03-04: 10 mg via INTRAVENOUS

## 2018-03-04 MED ORDER — LIDOCAINE 2% (20 MG/ML) 5 ML SYRINGE
INTRAMUSCULAR | Status: DC | PRN
  Administered 2018-03-04: 80 mg via INTRAVENOUS

## 2018-03-04 MED ORDER — SODIUM CHLORIDE 0.9 % IV SOLN
INTRAVENOUS | Status: DC
  Administered 2018-03-04: 14:00:00 via INTRAVENOUS

## 2018-03-04 MED ORDER — ONDANSETRON HCL 4 MG/2ML IJ SOLN
INTRAMUSCULAR | Status: DC | PRN
  Administered 2018-03-04: 4 mg via INTRAVENOUS

## 2018-03-04 MED ORDER — PROPOFOL 10 MG/ML IV BOLUS
INTRAVENOUS | Status: AC
  Filled 2018-03-04: qty 60

## 2018-03-04 SURGICAL SUPPLY — 15 items

## 2018-03-04 NOTE — Anesthesia Preprocedure Evaluation (Signed)
Anesthesia Evaluation  Patient identified by MRN, date of birth, ID band Patient awake    Reviewed: Allergy & Precautions, H&P , NPO status , Patient's Chart, lab work & pertinent test results  Airway Mallampati: I  TM Distance: >3 FB Neck ROM: Full    Dental no notable dental hx. (+) Poor Dentition, Dental Advisory Given   Pulmonary neg pulmonary ROS, former smoker,    Pulmonary exam normal breath sounds clear to auscultation       Cardiovascular hypertension, Pt. on medications + CAD and + CABG   Rhythm:Regular Rate:Normal     Neuro/Psych negative neurological ROS  negative psych ROS   GI/Hepatic negative GI ROS, Neg liver ROS,   Endo/Other  negative endocrine ROS  Renal/GU Renal InsufficiencyRenal disease  negative genitourinary   Musculoskeletal   Abdominal   Peds  Hematology negative hematology ROS (+) Blood dyscrasia, anemia ,   Anesthesia Other Findings   Reproductive/Obstetrics negative OB ROS                             Anesthesia Physical  Anesthesia Plan  ASA: III  Anesthesia Plan: MAC   Post-op Pain Management:    Induction: Intravenous  PONV Risk Score and Plan: 1 and Propofol infusion  Airway Management Planned: Nasal Cannula  Additional Equipment:   Intra-op Plan:   Post-operative Plan:   Informed Consent: I have reviewed the patients History and Physical, chart, labs and discussed the procedure including the risks, benefits and alternatives for the proposed anesthesia with the patient or authorized representative who has indicated his/her understanding and acceptance.   Dental advisory given  Plan Discussed with: CRNA, Anesthesiologist and Surgeon  Anesthesia Plan Comments:         Anesthesia Quick Evaluation

## 2018-03-04 NOTE — Op Note (Signed)
[facility] Patient Name: Mario Powell Procedure Date: 03/04/2018 MRN: [id number] Attending MD: Otis Brace , MD Date of Birth: 09-05-1945 CSN: [id number] Age: 72 Admit Type: Outpatient Procedure:                Upper GI endoscopy Indications:              Follow-up for gastric neuroendocrine tumor Providers:                Otis Brace, MD, Truddie Coco, RN, Cherylynn Ridges,                            Technician, Cathe Mons, CRNA Referring MD:              Medicines:                Sedation Administered by an Anesthesia Professional Complications:            No immediate complications. Estimated Blood Loss:     Estimated blood loss was minimal. Procedure:                Pre-Anesthesia Assessment:                           - Prior to the procedure, a History and Physical                            was performed, and patient medications and                            allergies were reviewed. [name] tolerance of                            previous anesthesia was also reviewed. The risks                            and benefits of the procedure and the sedation                            options and risks were discussed with the patient.                            All questions were answered, and informed consent                            was obtained. Prior Anticoagulants: The patient                            last took Coumadin (warfarin) 5 days and Lovenox                            (enoxaparin) 1 day prior to the procedure. ASA                            Grade Assessment: III - A patient with severe  systemic disease. After reviewing the risks and                            benefits, the patient was deemed in satisfactory                            condition to undergo the procedure.                           After obtaining informed consent, the endoscope was                            passed under direct vision. Throughout  the                            procedure, the patient's blood pressure, pulse, and                            oxygen saturations were monitored continuously. The                            GIF-H190 ([id number] Olympus adult endoscope was                            introduced through the mouth, and advanced to the                            second part of duodenum. The upper GI endoscopy was                            technically difficult and complex. The patient                            tolerated the procedure well. Scope In: Scope Out: Findings:      The Z-line was regular and was found 38 cm from the incisors.      The exam of the esophagus was otherwise normal.      Three small sessile polyps with no stigmata of recent bleeding were       found in the gastric body. These polyps were removed with a hot snare.       Resection and retrieval were complete.      Diffuse moderate mucosal changes characterized by granularity,       inflammation, nodularity and a decreased vascular pattern were found in       the gastric fundus. Biopsies were taken with a cold forceps for       histology. Estimated blood loss was minimal. Previously seen large       polyps were not visualized today.      The duodenal bulb, first portion of the duodenum and second portion of       the duodenum were normal. Impression:               - Z-line regular, 38 cm from the incisors.                           -  Three gastric polyps. Resected and retrieved.                           - Granular, inflamed, nodular and decreased                            vascular pattern mucosa in the gastric fundus.                            Biopsied.                           - Normal duodenal bulb, first portion of the                            duodenum and second portion of the duodenum. Moderate Sedation:      Moderate (conscious) sedation was personally administered by an       anesthesia professional. The following parameters  were monitored: oxygen       saturation, heart rate, blood pressure, and response to care. Recommendation:           - Patient has a contact number available for                            emergencies. The signs and symptoms of potential                            delayed complications were discussed with the                            patient. Return to normal activities tomorrow.                            Written discharge instructions were provided to the                            patient.                           - Resume previous diet.                           - Continue present medications.                           - Await pathology results.                           - Repeat upper endoscopy after studies are complete                            for surveillance based on pathology results.                           - Resume Coumadin and Lovenox as previously  recommended by Coumadin clinic. Procedure Code(s):        --- Professional ---                           [phone], Esophagogastroduodenoscopy, flexible,                            transoral; with removal of tumor(s), polyp(s), or                            other lesion(s) by snare technique                           43239, 32, Esophagogastroduodenoscopy, flexible,                            transoral; with biopsy, single or multiple Diagnosis Code(s):        --- Professional ---                           K31.7, Polyp of stomach and duodenum                           K29.70, Gastritis, unspecified, without bleeding                           K31.89, Other diseases of stomach and duodenum CPT copyright 2018 American Medical Association. All rights reserved. The codes documented in this report are preliminary and upon coder review may  be revised to meet current compliance requirements. Otis Brace, MD Otis Brace, MD 03/04/2018 2:59:48 PM Number of Addenda: 0

## 2018-03-04 NOTE — Discharge Instructions (Signed)

## 2018-03-04 NOTE — H&P (Signed)
Mario Powell is a 72 y.o. male with past medical history of gastric neuroendocrine tumor here for surveillance EGD.  Has multiple medical comorbidities.  Please see scanned H&P for details.    Coumadin on hold for 5 days.  Last dose of Lovenox was yesterday morning.   The various methods of treatment have been discussed with the patient and family. After consideration of risks, benefits and other options for treatment, the patient has consented to  Procedure(s): EGD as a surgical intervention to document complete removal of neuroendocrine tumor.    .  [name] history has been reviewed, patient examined, no change in status, stable for surgery.  I have reviewed the patient's chart and labs.  Questions were answered to the patient's satisfaction.   Risks (bleeding, infection, bowel perforation that could require surgery, sedation-related changes in cardiopulmonary systems), benefits (identification and possible treatment of source of symptoms, exclusion of certain causes of symptoms), and alternatives (watchful waiting, radiographic imaging studies, empiric medical treatment)  were explained to patient/family in detail and patient wishes to proceed.

## 2018-03-04 NOTE — Transfer of Care (Signed)
Immediate Anesthesia Transfer of Care Note  Patient: Mario Powell  Procedure(s) Performed: ESOPHAGOGASTRODUODENOSCOPY (EGD) WITH PROPOFOL (N/A ) POLYPECTOMY BIOPSY  Patient Location: Endoscopy Unit  Anesthesia Type:MAC  Level of Consciousness: drowsy and patient cooperative  Airway & Oxygen Therapy: Patient Spontanous Breathing and Patient connected to nasal cannula oxygen  Post-op Assessment: Report given to RN and Post -op Vital signs reviewed and stable  Post vital signs: Reviewed and stable  Last Vitals:  Vitals Value Taken Time  BP    Temp    Pulse 42 03/04/2018  2:59 PM  Resp 26 03/04/2018  2:59 PM  SpO2 77 % 03/04/2018  2:59 PM  Vitals shown include unvalidated device data.  Last Pain:  Vitals:   03/04/18 1351  TempSrc: Oral  PainSc: 0-No pain         Complications: No apparent anesthesia complications

## 2018-03-07 ENCOUNTER — Encounter (HOSPITAL_COMMUNITY): Payer: Self-pay | Admitting: Gastroenterology

## 2018-03-07 NOTE — Anesthesia Postprocedure Evaluation (Signed)
Anesthesia Post Note  Patient: DELOREAN KNUTZEN  Procedure(s) Performed: ESOPHAGOGASTRODUODENOSCOPY (EGD) WITH PROPOFOL (N/A ) POLYPECTOMY BIOPSY     Patient location during evaluation: PACU Anesthesia Type: MAC Level of consciousness: awake and alert Pain management: pain level controlled Vital Signs Assessment: post-procedure vital signs reviewed and stable Respiratory status: spontaneous breathing, nonlabored ventilation, respiratory function stable and patient connected to nasal cannula oxygen Cardiovascular status: stable and blood pressure returned to baseline Postop Assessment: no apparent nausea or vomiting Anesthetic complications: no    Last Vitals:  Vitals:   03/04/18 1510 03/04/18 1520  BP: (!) 142/96 (!) 126/92  Pulse: 75 73  Resp: (!) 22 16  Temp:    SpO2: 95% 100%    Last Pain:  Vitals:   03/04/18 1520  TempSrc:   PainSc: 0-No pain                 Brayn Eckstein

## 2018-03-10 ENCOUNTER — Emergency Department (HOSPITAL_COMMUNITY): Payer: Medicare Other

## 2018-03-10 ENCOUNTER — Encounter: Payer: Self-pay | Admitting: Family Medicine

## 2018-03-10 ENCOUNTER — Other Ambulatory Visit: Payer: Self-pay

## 2018-03-10 ENCOUNTER — Inpatient Hospital Stay (HOSPITAL_COMMUNITY)
Admission: EM | Admit: 2018-03-10 | Discharge: 2018-03-22 | DRG: 377 | Disposition: A | Discharge status: Home Health | Payer: Medicare Other | Attending: Family Medicine | Admitting: Family Medicine

## 2018-03-10 ENCOUNTER — Ambulatory Visit: Payer: Medicare Other | Admitting: Family Medicine

## 2018-03-10 ENCOUNTER — Inpatient Hospital Stay (HOSPITAL_COMMUNITY): Payer: Medicare Other

## 2018-03-10 ENCOUNTER — Encounter (HOSPITAL_COMMUNITY): Payer: Self-pay | Admitting: *Deleted

## 2018-03-10 DIAGNOSIS — E43 Unspecified severe protein-calorie malnutrition: Secondary | ICD-10-CM | POA: Diagnosis not present

## 2018-03-10 DIAGNOSIS — I898 Other specified noninfective disorders of lymphatic vessels and lymph nodes: Secondary | ICD-10-CM | POA: Diagnosis not present

## 2018-03-10 DIAGNOSIS — J90 Pleural effusion, not elsewhere classified: Secondary | ICD-10-CM | POA: Diagnosis present

## 2018-03-10 DIAGNOSIS — E871 Hypo-osmolality and hyponatremia: Secondary | ICD-10-CM | POA: Diagnosis present

## 2018-03-10 DIAGNOSIS — D62 Acute posthemorrhagic anemia: Secondary | ICD-10-CM | POA: Diagnosis present

## 2018-03-10 DIAGNOSIS — N184 Chronic kidney disease, stage 4 (severe): Secondary | ICD-10-CM | POA: Diagnosis present

## 2018-03-10 DIAGNOSIS — R0602 Shortness of breath: Secondary | ICD-10-CM | POA: Diagnosis present

## 2018-03-10 DIAGNOSIS — J189 Pneumonia, unspecified organism: Secondary | ICD-10-CM | POA: Diagnosis present

## 2018-03-10 DIAGNOSIS — N179 Acute kidney failure, unspecified: Secondary | ICD-10-CM | POA: Diagnosis not present

## 2018-03-10 DIAGNOSIS — I10 Essential (primary) hypertension: Secondary | ICD-10-CM | POA: Diagnosis present

## 2018-03-10 DIAGNOSIS — K922 Gastrointestinal hemorrhage, unspecified: Secondary | ICD-10-CM | POA: Diagnosis not present

## 2018-03-10 DIAGNOSIS — Z87891 Personal history of nicotine dependence: Secondary | ICD-10-CM | POA: Diagnosis not present

## 2018-03-10 DIAGNOSIS — Z430 Encounter for attention to tracheostomy: Secondary | ICD-10-CM | POA: Diagnosis not present

## 2018-03-10 DIAGNOSIS — I129 Hypertensive chronic kidney disease with stage 1 through stage 4 chronic kidney disease, or unspecified chronic kidney disease: Secondary | ICD-10-CM | POA: Diagnosis present

## 2018-03-10 DIAGNOSIS — I25810 Atherosclerosis of coronary artery bypass graft(s) without angina pectoris: Secondary | ICD-10-CM | POA: Diagnosis present

## 2018-03-10 DIAGNOSIS — J181 Lobar pneumonia, unspecified organism: Secondary | ICD-10-CM

## 2018-03-10 DIAGNOSIS — Z8249 Family history of ischemic heart disease and other diseases of the circulatory system: Secondary | ICD-10-CM | POA: Diagnosis not present

## 2018-03-10 DIAGNOSIS — I959 Hypotension, unspecified: Secondary | ICD-10-CM | POA: Diagnosis not present

## 2018-03-10 DIAGNOSIS — I2510 Atherosclerotic heart disease of native coronary artery without angina pectoris: Secondary | ICD-10-CM | POA: Diagnosis not present

## 2018-03-10 DIAGNOSIS — R946 Abnormal results of thyroid function studies: Secondary | ICD-10-CM | POA: Diagnosis present

## 2018-03-10 DIAGNOSIS — E538 Deficiency of other specified B group vitamins: Secondary | ICD-10-CM | POA: Diagnosis present

## 2018-03-10 DIAGNOSIS — R5381 Other malaise: Secondary | ICD-10-CM | POA: Diagnosis present

## 2018-03-10 DIAGNOSIS — R601 Generalized edema: Secondary | ICD-10-CM | POA: Diagnosis not present

## 2018-03-10 DIAGNOSIS — K921 Melena: Secondary | ICD-10-CM | POA: Diagnosis not present

## 2018-03-10 DIAGNOSIS — I480 Paroxysmal atrial fibrillation: Secondary | ICD-10-CM | POA: Diagnosis present

## 2018-03-10 DIAGNOSIS — D3A021 Benign carcinoid tumor of the cecum: Secondary | ICD-10-CM

## 2018-03-10 DIAGNOSIS — R188 Other ascites: Secondary | ICD-10-CM

## 2018-03-10 DIAGNOSIS — D3A8 Other benign neuroendocrine tumors: Secondary | ICD-10-CM | POA: Diagnosis present

## 2018-03-10 DIAGNOSIS — Z7901 Long term (current) use of anticoagulants: Secondary | ICD-10-CM | POA: Diagnosis not present

## 2018-03-10 DIAGNOSIS — Z952 Presence of prosthetic heart valve: Secondary | ICD-10-CM

## 2018-03-10 DIAGNOSIS — J9610 Chronic respiratory failure, unspecified whether with hypoxia or hypercapnia: Secondary | ICD-10-CM | POA: Diagnosis present

## 2018-03-10 DIAGNOSIS — E869 Volume depletion, unspecified: Secondary | ICD-10-CM | POA: Diagnosis present

## 2018-03-10 DIAGNOSIS — Z515 Encounter for palliative care: Secondary | ICD-10-CM | POA: Diagnosis not present

## 2018-03-10 DIAGNOSIS — Z8546 Personal history of malignant neoplasm of prostate: Secondary | ICD-10-CM | POA: Diagnosis not present

## 2018-03-10 DIAGNOSIS — K254 Chronic or unspecified gastric ulcer with hemorrhage: Secondary | ICD-10-CM | POA: Diagnosis present

## 2018-03-10 DIAGNOSIS — I350 Nonrheumatic aortic (valve) stenosis: Secondary | ICD-10-CM | POA: Diagnosis not present

## 2018-03-10 DIAGNOSIS — L8990 Pressure ulcer of unspecified site, unspecified stage: Secondary | ICD-10-CM | POA: Diagnosis present

## 2018-03-10 DIAGNOSIS — C7A8 Other malignant neuroendocrine tumors: Secondary | ICD-10-CM | POA: Diagnosis present

## 2018-03-10 HISTORY — DX: Disorder of kidney and ureter, unspecified: N28.9

## 2018-03-10 LAB — CBC WITH DIFFERENTIAL/PLATELET
Abs Immature Granulocytes: 0 10*3/uL (ref 0.00–0.07)
BASOS ABS: 0 10*3/uL (ref 0.0–0.1)
Basophils Relative: 0 %
Eosinophils Absolute: 0 10*3/uL (ref 0.0–0.5)
Eosinophils Relative: 0 %
HCT: 27.2 % — ABNORMAL LOW (ref 39.0–52.0)
Hemoglobin: 8.7 g/dL — ABNORMAL LOW (ref 13.0–17.0)
LYMPHS ABS: 0.2 10*3/uL — AB (ref 0.7–4.0)
Lymphocytes Relative: 3 %
MCH: 31.4 pg (ref 26.0–34.0)
MCHC: 32 g/dL (ref 30.0–36.0)
MCV: 98.2 fL (ref 80.0–100.0)
Monocytes Absolute: 0.5 10*3/uL (ref 0.1–1.0)
Monocytes Relative: 7 %
Neutro Abs: 5.9 10*3/uL (ref 1.7–7.7)
Neutrophils Relative %: 90 %
PLATELETS: 339 10*3/uL (ref 150–400)
RBC: 2.77 MIL/uL — ABNORMAL LOW (ref 4.22–5.81)
RDW: 15.2 % (ref 11.5–15.5)
WBC: 6.5 10*3/uL (ref 4.0–10.5)
nRBC: 0 % (ref 0.0–0.2)
nRBC: 0 /100 WBC

## 2018-03-10 LAB — LIPASE, BLOOD: LIPASE: 33 U/L (ref 11–51)

## 2018-03-10 LAB — COMPREHENSIVE METABOLIC PANEL
ALT: 29 U/L (ref 0–44)
AST: 35 U/L (ref 15–41)
Albumin: 2.2 g/dL — ABNORMAL LOW (ref 3.5–5.0)
Alkaline Phosphatase: 61 U/L (ref 38–126)
Anion gap: 11 (ref 5–15)
BUN: 70 mg/dL — ABNORMAL HIGH (ref 8–23)
CHLORIDE: 98 mmol/L (ref 98–111)
CO2: 21 mmol/L — ABNORMAL LOW (ref 22–32)
Calcium: 8.8 mg/dL — ABNORMAL LOW (ref 8.9–10.3)
Creatinine, Ser: 3.42 mg/dL — ABNORMAL HIGH (ref 0.61–1.24)
GFR calc Af Amer: 19 mL/min — ABNORMAL LOW (ref >60)
GFR calc non Af Amer: 17 mL/min — ABNORMAL LOW (ref >60)
Glucose, Bld: 150 mg/dL — ABNORMAL HIGH (ref 70–99)
Potassium: 5.3 mmol/L — ABNORMAL HIGH (ref 3.5–5.1)
Sodium: 130 mmol/L — ABNORMAL LOW (ref 135–145)
Total Bilirubin: 0.9 mg/dL (ref 0.3–1.2)
Total Protein: 5.4 g/dL — ABNORMAL LOW (ref 6.5–8.1)

## 2018-03-10 LAB — CBG MONITORING, ED: Glucose-Capillary: 122 mg/dL — ABNORMAL HIGH (ref 70–99)

## 2018-03-10 LAB — TROPONIN I
Troponin I: <0.03 ng/mL (ref <0.03)
Troponin I: <0.03 ng/mL (ref <0.03)

## 2018-03-10 LAB — TSH: TSH: 11.248 u[IU]/mL — AB (ref 0.350–4.500)

## 2018-03-10 LAB — POC OCCULT BLOOD, ED: Fecal Occult Bld: POSITIVE — AB

## 2018-03-10 LAB — PROTIME-INR
INR: 2.65
Prothrombin Time: 27.9 seconds — ABNORMAL HIGH (ref 11.4–15.2)

## 2018-03-10 LAB — HEMOGLOBIN AND HEMATOCRIT, BLOOD
HCT: 22.6 % — ABNORMAL LOW (ref 39.0–52.0)
HEMOGLOBIN: 7.5 g/dL — AB (ref 13.0–17.0)

## 2018-03-10 LAB — BRAIN NATRIURETIC PEPTIDE: B Natriuretic Peptide: 52.8 pg/mL (ref 0.0–100.0)

## 2018-03-10 MED ORDER — VICKS VAPORUB 4.73-1.2-2.6 % EX OINT: TOPICAL_OINTMENT | CUTANEOUS | Status: DC

## 2018-03-10 MED ORDER — WARFARIN 0.5 MG HALF TABLET
0.5000 mg | ORAL_TABLET | Freq: Once | ORAL | Status: AC
  Administered 2018-03-11: 0.5 mg via ORAL
  Filled 2018-03-10: qty 1

## 2018-03-10 MED ORDER — POLYVINYL ALCOHOL 1.4 % OP SOLN
2.0000 [drp] | Freq: Every day | OPHTHALMIC | Status: DC | PRN
  Filled 2018-03-10: qty 15

## 2018-03-10 MED ORDER — LEVOFLOXACIN IN D5W 750 MG/150ML IV SOLN
750.0000 mg | Freq: Once | INTRAVENOUS | Status: AC
  Administered 2018-03-10: 750 mg via INTRAVENOUS
  Filled 2018-03-10: qty 150

## 2018-03-10 MED ORDER — WARFARIN - PHARMACIST DOSING INPATIENT: Freq: Every day | Status: DC

## 2018-03-10 MED ORDER — POLYETHYLENE GLYCOL 3350 17 G PO PACK
17.0000 g | PACK | Freq: Every day | ORAL | Status: DC | PRN
  Administered 2018-03-19 – 2018-03-20 (×2): 17 g via ORAL
  Filled 2018-03-10 (×3): qty 1

## 2018-03-10 MED ORDER — ROSUVASTATIN CALCIUM 20 MG PO TABS
40.0000 mg | ORAL_TABLET | Freq: Every day | ORAL | Status: DC
  Administered 2018-03-11 – 2018-03-22 (×12): 40 mg via ORAL
  Filled 2018-03-10 (×13): qty 2

## 2018-03-10 MED ORDER — EZETIMIBE 10 MG PO TABS
10.0000 mg | ORAL_TABLET | Freq: Every day | ORAL | Status: DC
  Administered 2018-03-11 – 2018-03-22 (×12): 10 mg via ORAL
  Filled 2018-03-10 (×13): qty 1

## 2018-03-10 MED ORDER — LEVOFLOXACIN IN D5W 750 MG/150ML IV SOLN: 750.0000 mg | INTRAVENOUS | Status: DC

## 2018-03-10 MED ORDER — PANTOPRAZOLE SODIUM 40 MG PO TBEC: 40.0000 mg | DELAYED_RELEASE_TABLET | Freq: Every day | ORAL | Status: DC

## 2018-03-10 MED ORDER — PANTOPRAZOLE SODIUM 40 MG IV SOLR
40.0000 mg | Freq: Two times a day (BID) | INTRAVENOUS | Status: DC
  Administered 2018-03-11 – 2018-03-15 (×10): 40 mg via INTRAVENOUS
  Filled 2018-03-10 (×11): qty 40

## 2018-03-10 MED ORDER — NAPHAZOLINE-PHENIRAMINE 0.027-0.315 % OP SOLN: 2.0000 [drp] | Freq: Every day | OPHTHALMIC | Status: DC | PRN

## 2018-03-10 NOTE — ED Notes (Signed)
Patient transported to X-ray 

## 2018-03-10 NOTE — ED Triage Notes (Addendum)
Pt has been having sob and swelling in abdomen and feet.  Increased weakness and having dry heaving.  Decreased appetite and food gets stuck in throat.  Not getting around well. Pt has had 2 endoscopies and had removal of polyps. Pt has had fluid drawn off abdomen before and is on lovenox shots. Tried multiple times to temperature orally and unsuccessful.  Pt is on coumadin as well.

## 2018-03-10 NOTE — Progress Notes (Signed)
Pharmacy Antibiotic Note  Mario Powell is a 72 y.o. male admitted on 03/10/2018 with CAP.  Pharmacy has been consulted for Levaquin dosing.  Noted pt with PCN allergy when he was 72 years old - required trip to ED. Pt is not aware of ever having received PCN or cephalosporin since then.  Plan: Levaquin 750mg  IV q48h Will f/u micro data, pts clinical condition, and renal function  Height: 5\' 11"  (180.3 cm) Weight: 181 lb 12.8 oz (82.5 kg) IBW/kg (Calculated) : 75.3  Temp (24hrs), Avg:98.2 F (36.8 C), Min:98.2 F (36.8 C), Max:98.2 F (36.8 C)  Recent Labs  Lab 03/10/18 1408  WBC 6.5  CREATININE 3.42*    Estimated Creatinine Clearance: 20.2 mL/min (A) (by C-G formula based on SCr of 3.42 mg/dL (H)).    Allergies  Allergen Reactions  . Penicillins Other (See Comments)    DID THE REACTION INVOLVE: Swelling of the face/tongue/throat, SOB, or low BP? Unknown Sudden or severe rash/hives, skin peeling, or the inside of the mouth or nose? Unknown Did it require medical treatment? Yes When did it last happen?When pt was 72 years old Pt knows that his parents had to take him to the ER at the hospital and has not been retested with any PCN or cephalosporin that he is aware of.    Antimicrobials this admission: 12/26 Levaquin >>   Microbiology results: Pending  Thank you for allowing pharmacy to be a part of this patient's care.  Sherlon Handing, PharmD, BCPS Clinical pharmacist  **Pharmacist phone directory can now be found on [email or web address] ([email or web address]).  Listed under [place]. 03/10/2018 3:42 PM

## 2018-03-10 NOTE — Progress Notes (Signed)
Responded to PIV consult. Multiple providers in room for admission and x-ray.

## 2018-03-10 NOTE — ED Notes (Signed)
PAGED IM TO CINDY, RN

## 2018-03-10 NOTE — ED Notes (Signed)
Attempted report x1. 

## 2018-03-10 NOTE — H&P (Addendum)
[facility] Admission History and Physical Service Pager: [phone]  Patient name: Mario Powell Medical record number: [id number] Date of birth: Aug 11, 1945 Age: 72 y.o. Gender: male  Primary Care Provider: Wilber Oliphant, MD Consultants: GI  Code Status: Full   Chief Complaint: Shortness of breath, dark stools  Assessment and Plan: Mario Powell is a 72 y.o. male presenting with increasing shortness of breath, ascites, darkened stools for the past few days, and found to have a possible LLL pneumonia on chest x-ray. PMH is significant for gastric neuroendocrine polyps, history of mitral valve replacement with mechanical valve on chronic anticoagulation, HTN, prostate cancer s/p treatment, paroxysmal atrial fibrillation, anasarca of unknown source, CKD, normocytic anemia, hyponatremia, and chronic left pleural effusion.   Shortness of breath, likely multifactorial 2/2 ascites, chronic Left pleural effusion, LL Pneumonia, Anemia from GI bleed Pt and family report a few day history of increasing shortness of breath at rest and with activity.  Associated with increasing ascites, orthopnea, frequent coughing with thick sputum production, and subjective fever. On admission, VSS, afebrile.  Decreased breath sounds on the left, mild increased work of breathing with pauses in between sentances to catch breath. Pt was started on [place] for comfort, however satting 100% on RA.  Ascites with fluid wave. 1 View CXR with left pleural effusion and bibasilar atelectasis, cannot r/o consolidation in LLL.  No leukocytosis. Hgb 8.7, from 11.8 on 11/20.  BNP 52. EKG sinus, i-STAT troponin negative.  Shortness of breath likely multifactorial. Differential includes ascites pushing on diaphragm, LL pneumonia, left pleural effusion, blood loss anemia.  More likely secondary to increasing ascites over the past few days. Less likely pneumonia, but still possible given patient has had a productive cough  with subjective fever in the past few days.  Curb 65 score 2, moderate risk.  Could consider anemia as precipitating shortness of breath, however baseline appears around about 9-10, so not very far from this.  Unlikely cardiac, ACS or CHF, as patient without chest pain, trop negative, and no signs of ischemia on EKG, and BNP <100. Unlikley PE as pt is therapeutic on Warfarin w/ INR 2.6.  - Admit to telemetry, attending Dr. Erin Hearing - Consult IR for therapeutic/diagnostic paracentesis -SAAG analysis labs -Obtain echocardiogram - Continue IV Levaquin, started in the ED, for pneumonia coverage - Monitor CMP, CBC with differential - Incentive spirometry - Consider thoracentesis for chronic left effusion - EKG in the a.m. - Vitals per routine - Oxygen therapy, wean as tolerated  Melena, likely 2/2 UGIB: Acute. 2-day history of frequent "gritty" dark bowel movements, in the setting of recent EGD procedure with [name] neuroendocrine polyp removal on 12/20 and due to chronic anticoagulation.  FOBT positive. BUN 70.  Hgb 8.7, from 11.8 on 11/20. Likely upper GI bleed given recent procedure with biopsies taken and presentation of melena with elevated BUN. Unlikely alternative Upper GI source as EGD showed no gastris or ulcer. Cannot rule out lower GI source.Las colonoscopy was 11/2017,did not show any malignancy, but did show inflammatory polyp.  - GI consulted in the ED , appreciate recommendations after evaluation on 12/27 - Start IV Protonix 40 mg twice daily - Monitor CBC, repeat CBC tonight - Encourage p.o. fluid intake - Monitor stool output   Anasarca, associated with ascites: Acute on chronic. Abdomen distended with fluid wave, increasing per family.  Also with 3+ pitting edema to bilateral lower extremity. Recently started on torsemide 20 mg and spironolactone 100 mg by his  PCP for further diuresis.  Recently received paracentesis on 11/22, yielded 5L of chylous ascites.  Previous SAAG score  <1.1 with a peritoneal fluid albumin of 1.6 suggesting nephrotic syndrome or myxedema as likely causes.  Already has had an extensive work-up outpatient and inpatient via PCP and GI for etiology of anasarca and ascites, including negative hepatic and bone work up.  Concern for obstructive lymphedema causing anasarca, given history of lymphedema, vs myxedema.  Also consider nephrotic syndrome, albumin and total protein both low, will obtain a UA and 24-hour urine protein to assess this. - Obtain TSH -Monitor CBC, CMP - Consult IR as above for paracentesis, therapeutic and diagnostic - Obtain UA, consider 24-hour urine protein pending this - Continued work-up with GI and PCP for exact etiology, may require lymphoscintigraphy or similar MR imaging for evaluation of obstrucion - Holding home diuretic in the setting of AKI  AKI on CKD:  Cr 3.42, 2.21 on 11/20.  Patient was recently started on spironolactone and furosemide on 11/14 by his PCP.  While patient is quite edematous, believe this AKI is more likely secondary to diuretic therapy as his creatinine has acutely worsened with recent switch of medications, comparatively he has been edematous more chronically.  - Monitor CMP - Avoid nephrotoxic medications as possible - Monitor fluid status closely - Strict I and O  Gastric neuroendocrine tumor: Acute, recurrent.  Previously diagnosed and biopsied with small margins via EGD in September.  However recently had an EGD on 12/20 with surgical pathology showing additional well differentiated neuroendocrine tumor extending to the edge of biopsy.  Follows with [facility] and Dr. Annamaria Boots with hematology/oncology. - Ensure this is addressed during GI follow-up  History of mitral valve replacement with mechanical valve: Chronic, stable. Chronically on anticoagulation with warfarin.  INR therapeutic, 2.65.  Goal 2.5-3.5 due to his mechanical valve.  Although patient presents with melena, patient is hemodynamically  stable and likely 2/2 to slow bleed, therefore feel it is more important to continue his warfarin anticoagulation at this time due to his mechanical valve and paroxysmal atrial fibrillation.  - Obtain echocardiogram - Warfarin per pharmacy  Mild protein-calorie malnutrition: Chronic.  Albumin 2.2.  Family states he has a decreased appetite. - Nutrition consult  Paroxysmal atrial fibrillation: Chronic, stable. Currently in sinus rhythm, HR 80-100.  On chronic warfarin therapy as above, not on any rate controlling regimen. - Monitor on telemetry - Warfarin per pharmacy  History of prostate cancer: Stable.  Patient reports this was 2 decades ago, s/p treatment.  No current concerns. -Monitor outpatient  Chronic hypertension: Stable. SBP 100-130.  On diuretic therapy with spironolactone and torsemide has been controlling his pressure. - Holding home diuretic therapy in the setting of AKI, patient normotensive  Hyponatremia: Chronic.  Na 130, baseline appears around 131-135.  Difficult to assess whether this is related to fluid overload versus intravascularly depleted in setting of diuresis. -Monitor CMP - Consider urine sodium, osmol if not improving  Vitamin B12 deficiency: Chronic, stable.  Follows with Dr. Annamaria Boots outpatient for injections monthly, last injection on 11/20.   - Consider vitamin B12 injection vs deferment to outpatient  Normocytic anemia: Chronic.  Previously megaloblastic, receiving B12 injections monthly.  Hemoglobin 8.7, baseline around 9-10. Likely in the setting of blood loss as above.  Asymptomatic. - Monitor CBC - Repeat CBC tonight   FEN/GI: NPO at midnight Prophylaxis: On warfarin therapy-we will monitor bleeding closely  Disposition: Admit telemetry, attending Dr. Erin Hearing    History of  Present Illness:  Mario Powell is a 72 y.o. male with a past history of gastric neuroendocrine tumor via EGD biopsy on 12/20 and anasarca/ascites of unclear source  presenting with increasing shortness of breath and dark stools for the past few days.  His daughters are at bedside, provide additional history.  He notes an insidious onset of increasing shortness of breath at rest and with activity.  Endorses orthopnea, feels he can breathe better when he sleeps with 3-4 pillows.  He states his abdominal swelling in LE edema has been improving, however his daughter who lives with him states his abdominal swelling has been increasing over the last few days.  She also notes he has been coughing frequently with some clear sputum production that has gotten thicker.  Denies any recent fever (but daughter notes he has been complaining he was hotter than usual), sick contacts, abdominal pain, vomiting.  Has had a sore throat recently, and had some dry heaving this morning after eating.  He states he felt like this is due to his throat pain but he was not able to swallow well and "almost went down the wrong opening ". Follows with Dr. [name] with GI and Dr. Burr Medico with hematology/oncology currently for his anasarca and neuroendocrine tumor.  In addition, the patient endorses dark looser stools for the past 2 days approximately.  He also states he has been going more frequently than usual.  Denies any frank blood, mucus in the stool.  Denies any lightheadedness, dizziness, or weakness.  Of note, recently had an EGD on 12/20 with removal of 3 polyps.   ED Course: On arrival, he was hemodynamically stable with slight increased work of breathing but no hypoxemia.  Given oxygen for comfort which helped improve his symptomatology.  Abdomen/chest XR showing ascites with left pleural effusion and bibasilar atelectasis, cannot rule out coexistent consolidation in the left lower lobe.  Labs notable for sodium 130, potassium 5.3, CO2 21, glucose 150, BUN 70, creatinine 3.42, albumin 2.2, total protein 5.4.  Hemoglobin 8.7, WBC to 6.5.  BNP 52.  Fecal occult positive.  EKG sinus tach with PVCs.   He was started on Levaquin in the ED.   Review Of Systems: Per HPI with the following additions:  Review of Systems  Constitutional: Negative for fever.  HENT: Positive for sore throat.   Respiratory: Positive for cough, sputum production and shortness of breath. Negative for hemoptysis and wheezing.   Cardiovascular: Positive for orthopnea and leg swelling. Negative for chest pain and palpitations.  Gastrointestinal: Positive for diarrhea and melena. Negative for abdominal pain, heartburn, nausea and vomiting.  Genitourinary: Negative for dysuria and urgency.  Neurological: Negative for dizziness, focal weakness, loss of consciousness, weakness and headaches.    Patient Active Problem List   Diagnosis Date Noted  . SOB (shortness of breath) 03/10/2018  . Decreased breath sounds in left mid-lung field 01/27/2018  . Healthcare maintenance 01/27/2018  . Chronic kidney disease, stage III (moderate) (HCC)   . Mitral valve replaced   . Monoclonal paraproteinemia   . Pleural effusion, left   . 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 Anemia 12/23/2017  . Neuroendocrine cancer ([place]) 12/18/2017  . Hernia of anterior abdominal wall 12/18/2017  . Pseudogout of knee, right 12/18/2017  . 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 Lymphedema   . Pancytopenia ([place])   . Ascites   . AKI (acute kidney injury) ([place])   . Hyponatremia   . Anticoagulated   .  Anasarca 12/01/2017  . Renal insufficiency   . Encounter for therapeutic drug monitoring 04/16/2017  . Essential hypertension 07/12/2013  . Prostate cancer ([place]) 07/12/2013  . Chronic anticoagulation 07/12/2013  . Paroxysmal atrial fibrillation ([place]) 07/12/2013  . History of mitral valve replacement with mechanical valve 01/11/2013    Past Medical History: Past Medical History:  Diagnosis Date  . Anasarca 12/01/2017  . Atrial fibrillation ([place]) 07/12/2013   Perioperative, 2001   . CAD (coronary  artery disease) of artery bypass graft 07/12/2013   Saphenous vein graft 2001   . Chronic anticoagulation 07/12/2013  . Essential hypertension 07/12/2013  . History of mitral valve replacement with mechanical valve 01/11/2013   Mechanical mitral valve 2001  Bacterial endocarditis as the cause   . HOH (hard of hearing)   . Prostate cancer ([place]) 07/12/2013    Past Surgical History: Past Surgical History:  Procedure Laterality Date  . BIOPSY  12/06/2017   Procedure: BIOPSY;  Surgeon: Otis Brace, MD;  Location: [facility];  Service: Gastroenterology;;  . BIOPSY  03/04/2018   Procedure: BIOPSY;  Surgeon: Otis Brace, MD;  Location: [facility];  Service: Gastroenterology;;  . COLONOSCOPY WITH PROPOFOL N/A 12/06/2017   Procedure: COLONOSCOPY WITH PROPOFOL ;  Surgeon: Otis Brace, MD;  Location: [facility];  Service: Gastroenterology;  Laterality: N/A;  . CORONARY ARTERY BYPASS GRAFT     2001  . ESOPHAGOGASTRODUODENOSCOPY (EGD) WITH PROPOFOL N/A 12/06/2017   Procedure: ESOPHAGOGASTRODUODENOSCOPY (EGD) WITH PROPOFOL;  Surgeon: Otis Brace, MD;  Location: [facility];  Service: Gastroenterology;  Laterality: N/A;  . ESOPHAGOGASTRODUODENOSCOPY (EGD) WITH PROPOFOL N/A 03/04/2018   Procedure: ESOPHAGOGASTRODUODENOSCOPY (EGD) WITH PROPOFOL;  Surgeon: Otis Brace, MD;  Location: [facility];  Service: Gastroenterology;  Laterality: N/A;  . IR PARACENTESIS  12/03/2017  . IR PARACENTESIS  01/04/2018  . IR PARACENTESIS  02/04/2018  . MITRAL VALVE REPLACEMENT  2001   Mechanical prosthesis  . POLYPECTOMY  12/06/2017   Procedure: POLYPECTOMY;  Surgeon: Otis Brace, MD;  Location: [facility];  Service: Gastroenterology;;  . POLYPECTOMY  03/04/2018   Procedure: POLYPECTOMY;  Surgeon: Otis Brace, MD;  Location: [facility];  Service: Gastroenterology;;    Social History: Social History   Tobacco Use  . Smoking status: Former Research scientist (life sciences)  . Smokeless tobacco:  Never Used  . Tobacco comment: QUIT IN 09/1982  Substance Use Topics  . Alcohol use: Not Currently    Comment: NO ALCOHOL SINCE CHRISTMAS 2017  . Drug use: Never   Patient independent. Able to perform all ADLs and IADLs. Works in Engineer, technical sales.  Please also refer to relevant sections of EMR.  Family History: Family History  Problem Relation Age of Onset  . Heart attack Father   . Healthy Mother   . Healthy Sister   . Healthy Sister   daughter has hypothyroidism  Allergies and Medications: Allergies  Allergen Reactions  . Penicillins Other (See Comments)    DID THE REACTION INVOLVE: Swelling of the face/tongue/throat, SOB, or low BP? Unknown Sudden or severe rash/hives, skin peeling, or the inside of the mouth or nose? Unknown Did it require medical treatment? Yes When did it last happen?When pt was 72 years old Pt knows that his parents had to take him to the ER at the hospital and has not been retested with any PCN or cephalosporin that he is aware of.   No current facility-administered medications on file prior to encounter.    Current Outpatient Medications on File Prior to Encounter  Medication Sig Dispense Refill  .  Amino Acids-Protein Hydrolys (FEEDING SUPPLEMENT, PRO-STAT SUGAR FREE 64,) LIQD Take 30 mLs by mouth 3 (three) times daily with meals. (Patient taking differently: Take 30 mLs by mouth every evening. ) 900 mL 0  . bismuth subsalicylate (PEPTO BISMOL) 262 MG/15ML suspension Take 30 mLs by mouth as needed for indigestion.    . calcium-vitamin D (OSCAL WITH D) 500-200 MG-UNIT tablet Take 1 tablet by mouth 3 (three) times daily.     . Camphor-Eucalyptus-Menthol (VICKS VAPORUB EX) Apply 1 application topically See admin instructions. Apply under nose daily in the evening    . Cholecalciferol (VITAMIN D-3) 25 MCG (1000 UT) CAPS Take 1,000 Units by mouth daily.    Marland Kitchen enoxaparin (LOVENOX) 80 MG/0.8ML injection Inject 0.8 mLs (80 mg total) into the skin every 12 (twelve) hours. As  Instructed by Coumadin Clinic 20 Syringe 0  . ezetimibe (ZETIA) 10 MG tablet Take 1 tablet (10 mg total) by mouth daily. PLEASE CALL TO SCHEDULE F/U APPT FOR FUTURE REFILLS.Marland Kitchen3RD AND FINAL ATTEMPT! 15 tablet 0  . medium chain triglycerides (MCT OIL) oil Take 15 mLs by mouth 3 (three) times daily with meals. (Patient taking differently: Take 15 mLs by mouth at bedtime. ) 946 mL 12  . Multiple Vitamin (MULTIVITAMIN WITH MINERALS) TABS tablet Take 1 tablet by mouth daily. 90 tablet 3  . Naphazoline-Pheniramine (OPCON-A) 0.027-0.315 % SOLN Place 2-3 drops into both eyes daily as needed (for dry eyes).    . Omega-3 Fatty Acids (FISH OIL) 1000 MG CAPS Take 2,000 mg by mouth daily.    . pantoprazole (PROTONIX) 40 MG tablet Take 1 tablet (40 mg total) by mouth daily. 90 tablet 3  . rosuvastatin (CRESTOR) 40 MG tablet Take 1 tablet (40 mg total) by mouth daily. PLEASE CALL TO SCHEDULE F/U APPT FOR FUTURE REFILLS.Marland Kitchen3RD AND FINAL ATTEMPT! 15 tablet 0  . spironolactone (ALDACTONE) 100 MG tablet Take 1 tablet (100 mg total) by mouth daily. 30 tablet 1  . torsemide (DEMADEX) 20 MG tablet Take 1 tablet (20 mg total) by mouth daily. 30 tablet 1  . warfarin (COUMADIN) 1 MG tablet TAKE AS DIRECTED BY ANTICOAGULATION CLINIC (Patient taking differently: Take 0.5-1 mg by mouth See admin instructions. Take 0.5 mg by mouth daily on Monday, Wednesday and Friday. Take 1 mg by mouth daily on all other days.) 120 tablet 0    Objective: BP 109/74 (BP Location: Left Arm)   Pulse (!) 50   Temp 97.9 F (36.6 C) (Oral)   Resp 20   Ht 5\' 11"  (1.803 m)   Wt 183 lb 8 oz (83.2 kg)   SpO2 100%   BMI 25.59 kg/m  Exam: General: Alert, NAD HEENT: NCAT, MMM, oropharynx nonerythematous  Cardiac: RRR no m/g/r Lungs: Clear bilaterally with the exception of decreased breath sounds in the right lower lobe.  Minimal increased work of breathing on room air satting 100%, laying flat. Taking some breaks in between sentences to  breathe. Abdomen: soft, non-tender, distended with fluid wave, normoactive BS Msk: Moves all extremities spontaneously  Ext: Warm, dry, palpable radial pulses, 3+ pitting edema to bilateral lower extremity, 1+ pitting edema to dependent areas of bilateral arms. Neuro: No acute focal neuro deficits, difficulty hearing without aids in.   Labs and Imaging: CBC BMET  Recent Labs  Lab 03/10/18 1408 03/10/18 2003  WBC 6.5  --   HGB 8.7* 7.5*  HCT 27.2* 22.6*  PLT 339  --    Recent Labs  Lab 03/10/18 1408  NA 130*  K 5.3*  CL 98  CO2 21*  BUN 70*  CREATININE 3.42*  GLUCOSE 150*  CALCIUM 8.8*     Dg Abdomen Acute W/chest  Result Date: 03/10/2018 CLINICAL DATA:  Shortness of breath, pain and swelling in abdomen, swelling in feet and lower legs, increased weakness, dry heaving, anorexia, food stuck in throat, history essential hypertension, no coronary artery disease post bypass, atrial fibrillation EXAM: DG ABDOMEN ACUTE W/ 1V CHEST COMPARISON:  Chest radiograph 01/03/2018, CT abdomen and pelvis 01/05/2018 FINDINGS: Upper normal size of cardiac silhouette post CABG and MVR. Atherosclerotic calcification aorta. LEFT pleural effusion and bibasilar atelectasis. Cannot exclude consolidation in LEFT lower lobe. Upper lungs clear. No pneumothorax. Nonobstructive bowel gas pattern. Increased attenuation of abdomen with medial displacement of bowel consistent with ascites. No bowel wall thickening, evidence of obstruction, or free air. Bones demineralized. IMPRESSION: Ascites. LEFT pleural effusion and bibasilar atelectasis. Cannot exclude coexistent consolidation in LEFT lower lobe. Electronically Signed   By: Lavonia Dana M.D.   On: 03/10/2018 15:12   The above note was written by the PGY1 intern physician. It was reviewed by myself. Changes or additions are made in [place].    None     None   Bonnita Hollow, MD 03/10/2018, 9:16 PM PGY-2, [place] Intern pager:  [phone], text pages welcome

## 2018-03-10 NOTE — ED Notes (Signed)
Attempted to call report

## 2018-03-10 NOTE — ED Notes (Signed)
Pt attempted to produce urine sample for this tech. Pt insisted he needed to stand up in order to produce sample, and was able to do so fairly easily with minor assistance from this tech. However, after only about 15 seconds of standing Pt experienced weakness and needed to sit back on bed. Took several minutes to recover his breath. RN aware.

## 2018-03-10 NOTE — ED Notes (Addendum)
Pt had syncopal episode while trying to produce urine for a second time. Daughters alerted staff for immediate assistance. VSS stable and CBG normal. EDP Ralene Bathe and primary RN Cindy at bedside and aware. Hospitalist to be contacted.

## 2018-03-10 NOTE — ED Notes (Signed)
Resident returned call, will change transfer order to admit order (tele)

## 2018-03-10 NOTE — Progress Notes (Addendum)
ANTICOAGULATION CONSULT NOTE - Initial Consult  Pharmacy Consult for Coumadin Indication: mechanical mitral valve replacement and afib  Allergies  Allergen Reactions  . Penicillins Other (See Comments)    DID THE REACTION INVOLVE: Swelling of the face/tongue/throat, SOB, or low BP? Unknown Sudden or severe rash/hives, skin peeling, or the inside of the mouth or nose? Unknown Did it require medical treatment? Yes When did it last happen?When pt was 72 years old Pt knows that his parents had to take him to the ER at the hospital and has not been retested with any PCN or cephalosporin that he is aware of.    Patient Measurements: Height: 5\' 11"  (180.3 cm) Weight: 181 lb 12.8 oz (82.5 kg) IBW/kg (Calculated) : 75.3  Vital Signs: Temp: 98.2 F (36.8 C) (12/26 1346) Temp Source: Rectal (12/26 1346) BP: 125/66 (12/26 1930) Pulse Rate: 25 (12/26 1930)  Labs: Recent Labs    03/10/18 1408 03/10/18 1533  HGB 8.7*  --   HCT 27.2*  --   PLT 339  --   LABPROT  --  27.9*  INR  --  2.65  CREATININE 3.42*  --   TROPONINI <0.03  --     Estimated Creatinine Clearance: 20.2 mL/min (A) (by C-G formula based on SCr of 3.42 mg/dL (H)).   Medical History: Past Medical History:  Diagnosis Date  . Anasarca 12/01/2017  . Atrial fibrillation ([place]) 07/12/2013   Perioperative, 2001   . CAD (coronary artery disease) of artery bypass graft 07/12/2013   Saphenous vein graft 2001   . Chronic anticoagulation 07/12/2013  . Essential hypertension 07/12/2013  . History of mitral valve replacement with mechanical valve 01/11/2013   Mechanical mitral valve 2001  Bacterial endocarditis as the cause   . HOH (hard of hearing)   . Prostate cancer ([place]) 07/12/2013    Medications:  See electronic med rec  Assessment: 72 y.o. M known to pharmacy from earlier antibiotic dosing in the ED.  Anticoagulation: Pt on coumadin for mechanical MVR and afib. His coumadin has been on hold for endoscopy and he was  on Lovenox bridge - endoscopy completed 12/20 and Lovenox bridge to coumadin restarted post-endoscopy. Received Lovenox 80mg  this morning ~0900. Home coumadin dose: 1mg  daily except for 0.5mg  on M/W/F - last dose 12/25 0900.  INR 2.65 (therapeutic) on admission. Hgb down to 8.7 with FOBT positive. GI consulted and will see in a.m. Spoke with Family medicine and they are ok with giving coumadin tonight.  Noted pt started on Levaquin which may potentiate effects of coumadin.  Goal of Therapy:  INR 2.5-3.5 Monitor platelets by anticoagulation protocol: Yes   Plan:  Daily INR Coumadin 0.5mg  po now F/u CBC closely and GI consult  Sherlon Handing, PharmD, BCPS Clinical pharmacist  **Pharmacist phone directory can now be found on [email or web address] ([email or web address]).  Listed under [place]. 03/10/2018,7:55 PM

## 2018-03-10 NOTE — ED Notes (Signed)
Attempted x2 to contact attending and admitting in regards to pt being assigned a Med Surg Bed with cardiac monitoring orders.

## 2018-03-10 NOTE — ED Notes (Signed)
Pt informed he needs to provide a UA, Pt verbalized understanding and given urinal.  Dr. Laverta Baltimore at bedside at this time providing update.

## 2018-03-10 NOTE — Plan of Care (Signed)
  Problem: Safety: Goal: Ability to remain free from injury will improve Outcome: Progressing   

## 2018-03-10 NOTE — ED Provider Notes (Signed)
Emergency Department Provider Note   I have reviewed the triage vital signs and the nursing notes.   HISTORY  Chief Complaint No chief complaint on file.   HPI Mario Powell is a 72 y.o. male with PMH of A-fib, CAD, HTN, mechanical heart valve on Coumadin, CKD, Neuroendocrine tumor, and left pleural effusion to the emergency department with increased shortness of breath over the past 2 to 3 days along with sensation of something stuck in his throat.  Terms of the shortness of breath the patient has been undergoing home diuresis for anasarca via his primary care physician's order.  Swelling has improved but the patient has become progressively more short of breath.  He feels like his abdomen is distended and likely needs paracentesis again.  Any fevers or shaking chills.  Family has noticed a productive cough with some dry heaving this morning.  Patient states that he has been eating very little over the past several days but this morning tried to eat some dry cereal and feels like it got stuck in his throat.  He follows with [facility] gastroenterology who performed regular EGD. Last EGD was on 12/20 with Dr. [name].   Past Medical History:  Diagnosis Date  . Anasarca 12/01/2017  . Atrial fibrillation ([place]) 07/12/2013   Perioperative, 2001   . CAD (coronary artery disease) of artery bypass graft 07/12/2013   Saphenous vein graft 2001   . Chronic anticoagulation 07/12/2013  . Essential hypertension 07/12/2013  . History of mitral valve replacement with mechanical valve 01/11/2013   Mechanical mitral valve 2001  Bacterial endocarditis as the cause   . HOH (hard of hearing)   . Prostate cancer ([place]) 07/12/2013    Patient Active Problem List   Diagnosis Date Noted  . SOB (shortness of breath) 03/10/2018  . Decreased breath sounds in left mid-lung field 01/27/2018  . Healthcare maintenance 01/27/2018  . Chronic kidney disease, stage III (moderate) (HCC)   . Mitral valve replaced   .  Monoclonal paraproteinemia   . Pleural effusion, left   . 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 Anemia 12/23/2017  . Neuroendocrine cancer ([place]) 12/18/2017  . Hernia of anterior abdominal wall 12/18/2017  . Pseudogout of knee, right 12/18/2017  . 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 Lymphedema   . Pancytopenia ([place])   . Ascites   . AKI (acute kidney injury) ([place])   . Hyponatremia   . Anticoagulated   . Anasarca 12/01/2017  . Renal insufficiency   . Encounter for therapeutic drug monitoring 04/16/2017  . Essential hypertension 07/12/2013  . Prostate cancer ([place]) 07/12/2013  . Chronic anticoagulation 07/12/2013  . Paroxysmal atrial fibrillation ([place]) 07/12/2013  . History of mitral valve replacement with mechanical valve 01/11/2013    Past Surgical History:  Procedure Laterality Date  . BIOPSY  12/06/2017   Procedure: BIOPSY;  Surgeon: Otis Brace, MD;  Location: [place];  Service: Gastroenterology;;  . BIOPSY  03/04/2018   Procedure: BIOPSY;  Surgeon: Otis Brace, MD;  Location: [facility];  Service: Gastroenterology;;  . COLONOSCOPY WITH PROPOFOL N/A 12/06/2017   Procedure: COLONOSCOPY WITH PROPOFOL ;  Surgeon: Otis Brace, MD;  Location: [place];  Service: Gastroenterology;  Laterality: N/A;  . CORONARY ARTERY BYPASS GRAFT     2001  . ESOPHAGOGASTRODUODENOSCOPY (EGD) WITH PROPOFOL N/A 12/06/2017   Procedure: ESOPHAGOGASTRODUODENOSCOPY (EGD) WITH PROPOFOL;  Surgeon: Otis Brace, MD;  Location: [place];  Service: Gastroenterology;  Laterality: N/A;  . ESOPHAGOGASTRODUODENOSCOPY (EGD)  WITH PROPOFOL N/A 03/04/2018   Procedure: ESOPHAGOGASTRODUODENOSCOPY (EGD) WITH PROPOFOL;  Surgeon: Otis Brace, MD;  Location: [facility];  Service: Gastroenterology;  Laterality: N/A;  . IR PARACENTESIS  12/03/2017  . IR PARACENTESIS  01/04/2018  . IR PARACENTESIS  02/04/2018  . MITRAL VALVE REPLACEMENT   2001   Mechanical prosthesis  . POLYPECTOMY  12/06/2017   Procedure: POLYPECTOMY;  Surgeon: Otis Brace, MD;  Location: [facility] ENDOSCOPY;  Service: Gastroenterology;;  . POLYPECTOMY  03/04/2018   Procedure: POLYPECTOMY;  Surgeon: Otis Brace, MD;  Location: [facility];  Service: Gastroenterology;;   Allergies Penicillins  Family History  Problem Relation Age of Onset  . Heart attack Father   . Healthy Mother   . Healthy Sister   . Healthy Sister     Social History Social History   Tobacco Use  . Smoking status: Former Research scientist (life sciences)  . Smokeless tobacco: Never Used  . Tobacco comment: QUIT IN 09/1982  Substance Use Topics  . Alcohol use: Not Currently    Comment: NO ALCOHOL SINCE CHRISTMAS 2017  . Drug use: Never    Review of Systems  Constitutional: No fever/chills Eyes: No visual changes. ENT: No sore throat but feels like a food bolus is stuck.  Cardiovascular: Denies chest pain. Respiratory: Positive shortness of breath and cough.  Gastrointestinal: No abdominal pain. Positive nausea and vomiting.  No diarrhea.  No constipation. Genitourinary: Negative for dysuria. Musculoskeletal: Negative for back pain. Skin: Negative for rash. Neurological: Negative for headaches, focal weakness or numbness.  10-point ROS otherwise negative.  ____________________________________________   PHYSICAL EXAM:  VITAL SIGNS: ED Triage Vitals  Enc Vitals Group     BP 03/10/18 1322 (!) 98/47     Pulse Rate 03/10/18 1322 97     Resp 03/10/18 1322 20     Temp 03/10/18 1346 98.2 F (36.8 C)     Temp Source 03/10/18 1346 Rectal     SpO2 03/10/18 1322 100 %     Weight 03/10/18 1346 181 lb 12.8 oz (82.5 kg)     Height 03/10/18 1346 5\' 11"  (1.803 m)     Pain Score 03/10/18 1323 3   Constitutional: Alert and oriented. Patient is chronically ill-appearing but able to provide a full history.  Eyes: Conjunctivae are normal. Head: Atraumatic. Nose: No  congestion/rhinnorhea. Mouth/Throat: Mucous membranes are moist.   Neck: No stridor.   Cardiovascular: Normal rate, regular rhythm. Good peripheral circulation. Grossly normal heart sounds.   Respiratory: Increased respiratory effort.  No retractions. Lungs diminished on the left base.  Gastrointestinal: Soft and nontender. Positive distension with fluid wave.  Musculoskeletal: 4+ bilateral pitting edema in the lower extremities.  Neurologic:  Normal speech and language. No gross focal neurologic deficits are appreciated.  Skin:  Skin is warm, dry and intact. No rash noted.  ____________________________________________   LABS (all labs ordered are listed, but only abnormal results are displayed)  Labs Reviewed  COMPREHENSIVE METABOLIC PANEL - Abnormal; Notable for the following components:      Result Value   Sodium 130 (*)    Potassium 5.3 (*)    CO2 21 (*)    Glucose, Bld 150 (*)    BUN 70 (*)    Creatinine, Ser 3.42 (*)    Calcium 8.8 (*)    Total Protein 5.4 (*)    Albumin 2.2 (*)    GFR calc non Af Amer 17 (*)    GFR calc Af Amer 19 (*)    All other components  within normal limits  CBC WITH DIFFERENTIAL/PLATELET - Abnormal; Notable for the following components:   RBC 2.77 (*)    Hemoglobin 8.7 (*)    HCT 27.2 (*)    Lymphs Abs 0.2 (*)    All other components within normal limits  PROTIME-INR - Abnormal; Notable for the following components:   Prothrombin Time 27.9 (*)    All other components within normal limits  TSH - Abnormal; Notable for the following components:   TSH 11.248 (*)    All other components within normal limits  POC OCCULT BLOOD, ED - Abnormal; Notable for the following components:   Fecal Occult Bld POSITIVE (*)    All other components within normal limits  CBG MONITORING, ED - Abnormal; Notable for the following components:   Glucose-Capillary 122 (*)    All other components within normal limits  LIPASE, BLOOD  BRAIN NATRIURETIC PEPTIDE   TROPONIN I  URINALYSIS, ROUTINE W REFLEX MICROSCOPIC  COMPREHENSIVE METABOLIC PANEL  CBC WITH DIFFERENTIAL/PLATELET  HEMOGLOBIN AND HEMATOCRIT, BLOOD  TROPONIN I  TROPONIN I  TROPONIN I  T4, FREE  T3, FREE   ____________________________________________  EKG   EKG Interpretation  Date/Time:  Thursday March 10 2018 13:17:35 EST Ventricular Rate:  101 PR Interval:  120 QRS Duration: 80 QT Interval:  308 QTC Calculation: 399 R Axis:   50 Text Interpretation:  Sinus tachycardia with Premature supraventricular complexes Low voltage QRS Cannot rule out Anterior infarct , age undetermined Abnormal ECG No STEMI.  Confirmed by Nanda Quinton [phone]) on 03/10/2018 3:03:18 PM       ____________________________________________  RADIOLOGY  Dg Abdomen Acute W/chest  Result Date: 03/10/2018 CLINICAL DATA:  Shortness of breath, pain and swelling in abdomen, swelling in feet and lower legs, increased weakness, dry heaving, anorexia, food stuck in throat, history essential hypertension, no coronary artery disease post bypass, atrial fibrillation EXAM: DG ABDOMEN ACUTE W/ 1V CHEST COMPARISON:  Chest radiograph 01/03/2018, CT abdomen and pelvis 01/05/2018 FINDINGS: Upper normal size of cardiac silhouette post CABG and MVR. Atherosclerotic calcification aorta. LEFT pleural effusion and bibasilar atelectasis. Cannot exclude consolidation in LEFT lower lobe. Upper lungs clear. No pneumothorax. Nonobstructive bowel gas pattern. Increased attenuation of abdomen with medial displacement of bowel consistent with ascites. No bowel wall thickening, evidence of obstruction, or free air. Bones demineralized. IMPRESSION: Ascites. LEFT pleural effusion and bibasilar atelectasis. Cannot exclude coexistent consolidation in LEFT lower lobe. Electronically Signed   By: Lavonia Dana M.D.   On: 03/10/2018 15:12    ____________________________________________   PROCEDURES  Procedure(s) performed:    Procedures  CRITICAL CARE Performed by: Margette Fast Total critical care time: 35 minutes Critical care time was exclusive of separately billable procedures and treating other patients. Critical care was necessary to treat or prevent imminent or life-threatening deterioration. Critical care was time spent personally by me on the following activities: development of treatment plan with patient and/or surrogate as well as nursing, discussions with consultants, evaluation of patient's response to treatment, examination of patient, obtaining history from patient or surrogate, ordering and performing treatments and interventions, ordering and review of laboratory studies, ordering and review of radiographic studies, pulse oximetry and re-evaluation of patient's condition.  Nanda Quinton, MD Emergency Medicine  ____________________________________________   INITIAL IMPRESSION / ASSESSMENT AND PLAN / ED COURSE  Pertinent labs & imaging results that were available during my care of the patient were reviewed by me and considered in my medical decision making (see chart for details).  Patient  with anasarca, neuroendocrine tumor, and known left pleural effusion presents to the emergency department with shortness of breath, sensation of food stuck in the throat, and black stools.  Patient has ascites on exam.  He has increased work of breathing but no hypoxemia.  Patient does not require BiPAP at this time.  Provided nasal cannula oxygen for comfort which seems to have improved symptoms.   03:03 PM Discussed the case with [facility], Dr. Paulita Fujita. They will consult in the AM. No emergent EDG with patient managing secretions and comfortable. No vomiting.   Imaging and labs reviewed. Updated patient who is laying flat in bed with no distress. CXR with pleural effusion and cannot exclude infiltrate. With worsening SOB and improved edema overall plan to cover with Lovenox temporarily. Discussed GI f/u in the  AM and admit overnight. Will page admit team.   Discussed patient's case with Family Medicine to request admission. Patient and family (if present) updated with plan. Care transferred to [facility] Medicine service.  I reviewed all nursing notes, vitals, pertinent old records, EKGs, labs, imaging (as available).  ____________________________________________  FINAL CLINICAL IMPRESSION(S) / ED DIAGNOSES  Final diagnoses:  SOB (shortness of breath)  Gastrointestinal hemorrhage, unspecified gastrointestinal hemorrhage type  Ascites     MEDICATIONS GIVEN DURING THIS VISIT:  Medications  levofloxacin (LEVAQUIN) IVPB 750 mg (has no administration in time range)  ezetimibe (ZETIA) tablet 10 mg (has no administration in time range)  rosuvastatin (CRESTOR) tablet 40 mg (has no administration in time range)  Vicks Vaporub 4.73-1.2-2.6 % OINT (has no administration in time range)  Naphazoline-Pheniramine 0.027-0.315 % SOLN 2-3 drop (has no administration in time range)  polyethylene glycol (MIRALAX / GLYCOLAX) packet 17 g (has no administration in time range)  pantoprazole (PROTONIX) injection 40 mg (has no administration in time range)  levofloxacin (LEVAQUIN) IVPB 750 mg (0 mg Intravenous Stopped 03/10/18 1907)    Note:  This document was prepared using Dragon voice recognition software and may include unintentional dictation errors.  Nanda Quinton, MD Emergency Medicine    Lanson Randle, [facility], MD 03/10/18 2020

## 2018-03-11 ENCOUNTER — Inpatient Hospital Stay (HOSPITAL_COMMUNITY): Payer: Medicare Other

## 2018-03-11 DIAGNOSIS — E43 Unspecified severe protein-calorie malnutrition: Secondary | ICD-10-CM

## 2018-03-11 DIAGNOSIS — Z952 Presence of prosthetic heart valve: Secondary | ICD-10-CM

## 2018-03-11 DIAGNOSIS — K922 Gastrointestinal hemorrhage, unspecified: Secondary | ICD-10-CM | POA: Diagnosis present

## 2018-03-11 DIAGNOSIS — J189 Pneumonia, unspecified organism: Secondary | ICD-10-CM | POA: Diagnosis present

## 2018-03-11 DIAGNOSIS — J181 Lobar pneumonia, unspecified organism: Secondary | ICD-10-CM

## 2018-03-11 DIAGNOSIS — I350 Nonrheumatic aortic (valve) stenosis: Secondary | ICD-10-CM

## 2018-03-11 LAB — CBC
HCT: 26.4 % — ABNORMAL LOW (ref 39.0–52.0)
Hemoglobin: 8.7 g/dL — ABNORMAL LOW (ref 13.0–17.0)
MCH: 30.3 pg (ref 26.0–34.0)
MCHC: 33 g/dL (ref 30.0–36.0)
MCV: 92 fL (ref 80.0–100.0)
Platelets: 235 10*3/uL (ref 150–400)
RBC: 2.87 MIL/uL — ABNORMAL LOW (ref 4.22–5.81)
RDW: 16.4 % — ABNORMAL HIGH (ref 11.5–15.5)
WBC: 5.4 10*3/uL (ref 4.0–10.5)
nRBC: 0 % (ref 0.0–0.2)

## 2018-03-11 LAB — COMPREHENSIVE METABOLIC PANEL
ALT: 21 U/L (ref 0–44)
AST: 22 U/L (ref 15–41)
Albumin: 1.8 g/dL — ABNORMAL LOW (ref 3.5–5.0)
Alkaline Phosphatase: 45 U/L (ref 38–126)
Anion gap: 9 (ref 5–15)
BILIRUBIN TOTAL: 0.5 mg/dL (ref 0.3–1.2)
BUN: 83 mg/dL — ABNORMAL HIGH (ref 8–23)
CALCIUM: 8.3 mg/dL — AB (ref 8.9–10.3)
CO2: 24 mmol/L (ref 22–32)
CREATININE: 3.44 mg/dL — AB (ref 0.61–1.24)
Chloride: 96 mmol/L — ABNORMAL LOW (ref 98–111)
GFR calc Af Amer: 19 mL/min — ABNORMAL LOW (ref >60)
GFR calc non Af Amer: 17 mL/min — ABNORMAL LOW (ref >60)
Glucose, Bld: 128 mg/dL — ABNORMAL HIGH (ref 70–99)
Potassium: 5.4 mmol/L — ABNORMAL HIGH (ref 3.5–5.1)
Sodium: 129 mmol/L — ABNORMAL LOW (ref 135–145)
Total Protein: 4.2 g/dL — ABNORMAL LOW (ref 6.5–8.1)

## 2018-03-11 LAB — ECHOCARDIOGRAM COMPLETE
Height: 71 in
Weight: 2934.76 oz

## 2018-03-11 LAB — CBC WITH DIFFERENTIAL/PLATELET
Abs Immature Granulocytes: 0.02 10*3/uL (ref 0.00–0.07)
BASOS PCT: 0 %
Basophils Absolute: 0 10*3/uL (ref 0.0–0.1)
EOS ABS: 0 10*3/uL (ref 0.0–0.5)
Eosinophils Relative: 0 %
HCT: 18.2 % — ABNORMAL LOW (ref 39.0–52.0)
Hemoglobin: 6 g/dL — CL (ref 13.0–17.0)
Immature Granulocytes: 0 %
Lymphocytes Relative: 4 %
Lymphs Abs: 0.2 10*3/uL — ABNORMAL LOW (ref 0.7–4.0)
MCH: 32.1 pg (ref 26.0–34.0)
MCHC: 33 g/dL (ref 30.0–36.0)
MCV: 97.3 fL (ref 80.0–100.0)
Monocytes Absolute: 0.5 10*3/uL (ref 0.1–1.0)
Monocytes Relative: 11 %
Neutro Abs: 4.1 10*3/uL (ref 1.7–7.7)
Neutrophils Relative %: 85 %
PLATELETS: 217 10*3/uL (ref 150–400)
RBC: 1.87 MIL/uL — ABNORMAL LOW (ref 4.22–5.81)
RDW: 15.6 % — ABNORMAL HIGH (ref 11.5–15.5)
WBC: 4.8 10*3/uL (ref 4.0–10.5)
nRBC: 0 % (ref 0.0–0.2)

## 2018-03-11 LAB — URINALYSIS, ROUTINE W REFLEX MICROSCOPIC
Bilirubin Urine: NEGATIVE
Glucose, UA: NEGATIVE mg/dL
Hgb urine dipstick: NEGATIVE
KETONES UR: NEGATIVE mg/dL
Leukocytes, UA: NEGATIVE
Nitrite: NEGATIVE
Protein, ur: NEGATIVE mg/dL
Specific Gravity, Urine: 1.016 (ref 1.005–1.030)
pH: 5 (ref 5.0–8.0)

## 2018-03-11 LAB — TROPONIN I
Troponin I: <0.03 ng/mL (ref <0.03)
Troponin I: <0.03 ng/mL (ref <0.03)

## 2018-03-11 LAB — ABO/RH: ABO/RH(D): A POS

## 2018-03-11 LAB — T4, FREE: Free T4: 0.99 ng/dL (ref 0.82–1.77)

## 2018-03-11 LAB — PREPARE RBC (CROSSMATCH)

## 2018-03-11 LAB — PROTIME-INR
INR: 2.8
Prothrombin Time: 29.1 seconds — ABNORMAL HIGH (ref 11.4–15.2)

## 2018-03-11 LAB — PROTEIN, TOTAL: Total Protein: 4.8 g/dL — ABNORMAL LOW (ref 6.5–8.1)

## 2018-03-11 LAB — ALBUMIN: Albumin: 2.1 g/dL — ABNORMAL LOW (ref 3.5–5.0)

## 2018-03-11 LAB — LACTATE DEHYDROGENASE: LDH: 190 U/L (ref 98–192)

## 2018-03-11 MED ORDER — PRO-STAT SUGAR FREE PO LIQD
30.0000 mL | Freq: Three times a day (TID) | ORAL | Status: DC
  Administered 2018-03-11 – 2018-03-22 (×26): 30 mL via ORAL
  Filled 2018-03-11 (×27): qty 30

## 2018-03-11 MED ORDER — ACETAMINOPHEN 325 MG PO TABS: 650.0000 mg | ORAL_TABLET | Freq: Four times a day (QID) | ORAL | Status: DC | PRN

## 2018-03-11 MED ORDER — FUROSEMIDE 10 MG/ML IJ SOLN
40.0000 mg | Freq: Once | INTRAMUSCULAR | Status: AC
  Administered 2018-03-11: 40 mg via INTRAVENOUS
  Filled 2018-03-11: qty 4

## 2018-03-11 MED ORDER — BOOST / RESOURCE BREEZE PO LIQD CUSTOM
1.0000 | Freq: Three times a day (TID) | ORAL | Status: DC
  Administered 2018-03-11 – 2018-03-17 (×7): 1 via ORAL

## 2018-03-11 MED ORDER — SODIUM CHLORIDE 0.9% IV SOLUTION
Freq: Once | INTRAVENOUS | Status: AC
  Administered 2018-03-11: 08:00:00 via INTRAVENOUS

## 2018-03-11 MED ORDER — ADULT MULTIVITAMIN W/MINERALS CH
1.0000 | ORAL_TABLET | Freq: Every day | ORAL | Status: DC
  Administered 2018-03-11 – 2018-03-22 (×11): 1 via ORAL
  Filled 2018-03-11 (×12): qty 1

## 2018-03-11 NOTE — Progress Notes (Signed)
Patient seen today by attending, Dr. Erin Hearing.  I discussed with full resident team in rounds.  Agree with their documentation and management.

## 2018-03-11 NOTE — Progress Notes (Signed)
PT Cancellation Note  Patient Details Name: KRISS ISHLER MRN: [id number] DOB: Oct 08, 1945   Cancelled Treatment:    Reason Eval/Treat Not Completed: Patient not medically ready. Pt with critical Hgb value, receiving blood transfusion. Will follow-up for PT evaluation as appropriate.  Mabeline Caras, PT, DPT Acute Rehabilitation Services  Pager [phone] Office [phone]  Derry Lory 03/11/2018, 12:21 PM

## 2018-03-11 NOTE — Progress Notes (Signed)
Family Medicine Teaching Service Daily Progress Note Intern Pager: [phone]  Patient name: Mario Powell Medical record number: [id number] Date of birth: 08-03-1945 Age: 72 y.o. Gender: male  Primary Care Provider: Wilber Oliphant, MD Consultants: GI Code Status: Full code  Pt Overview and Major Events to Date:  Hospital Day 1 Admitted: 03/10/2018   Assessment and Plan: Mario Powell is a 72 y.o. male presenting with increasing shortness of breath, ascites, darkened stools for the past few days, and found to have a possible LLL pneumonia on chest x-ray. PMH is significant for gastric neuroendocrine polyps, history of mitral valve replacement with mechanical valve on chronic anticoagulation, HTN, prostate cancer s/p treatment, paroxysmal atrial fibrillation, anasarca of unknown source, CKD, normocytic anemia, hyponatremia, and chronic left pleural effusion.   Shortness of breath, likely multifactorial 2/2 ascites, chronic Left pleural effusion, LL Pneumonia, Anemia from GI bleed Pt and family report a few day history of increasing shortness of breath at rest and with activity. . On admission, VSS, afebrile. He has mild increased work of breathing with pauses in between sentences to catch breath. Pt was started on [place] for comfort, however satting 100% on RA.   1 View CXR with left pleural effusion and bibasilar atelectasis, cannot r/o consolidation in LLL.  No leukocytosis. Hgb decreased since last admission and decreasing during this admission.  BNP 52. EKG sinus, i-STAT troponin negative.  Shortness of breath likely multifactorial including ascites, anemia, and pneumonia.    Unlikely cardiac, ACS or CHF, as patient without chest pain, trop negative, and no signs of ischemia on EKG, and BNP <100. Unlikley PE as pt is therapeutic on Warfarin w/ INR 2.6.  - Consult IR for therapeutic/diagnostic paracentesis - SAAG analysis labs -Obtain echocardiogram - Continue IV Levaquin, started in the ED, for  pneumonia coverage - Monitor CMP, CBC with differential - Incentive spirometry - Consider thoracentesis for chronic left effusion - Vitals per routine - Oxygen therapy, wean as tolerated  Acute GI bleed. 2-day history of frequent "gritty" dark bowel movements, in the setting of recent EGD procedure with [name] neuroendocrine polyp removal on 12/20 and due to chronic anticoagulation.  FOBT positive. BUN 70.  Hgb 8.7>7.5>6.0. , from 11.8 on 11/20. Likely upper GI bleed given recent procedure with biopsies taken and presentation of melena with elevated BUN. Unlikely alternative Upper GI source as EGD showed no gastris or ulcer. Cannot rule out lower GI source.Las colonoscopy was 11/2017,did not show any malignancy, but did show inflammatory polyp.  - transfuse 2 U pRBC - GI consulted , appreciate recommendations  - Protonix 40 mg twice daily - Monitor CBC,  - Encourage p.o. fluid intake - Monitor stool output   Anasarca, associated with ascites: Acute on chronic. Abdomen distended with fluid wave, increasing per family. Last paracentesis was over days ago.  Also with 3+ pitting edema to bilateral lower extremity. Recently started on torsemide 20 mg and spironolactone 100 mg by his PCP for further diuresis.  Recently received paracentesis on 11/22, yielded 5L of chylous ascites. TSH was 11.248, T4 was nml.  -Monitor CBC, CMP - Consult IR as above for paracentesis, therapeutic and diagnostic - Obtain UA, consider 24-hour urine protein pending this - Continued work-up with GI and PCP for exact etiology, may require lymphoscintigraphy or similar MR imaging for evaluation of obstrucion - Holding home diuretic in the setting of AKI  AKI on CKD:  Cr 3.42>3.44, up from 2.21 on 11/20.  Patient was recently started on spironolactone  and furosemide on 11/14 by his PCP.  While patient is quite edematous, believe this AKI is more likely secondary to diuretic therapy as his creatinine has acutely  worsened with recent switch of medications, comparatively he has been edematous more chronically.  - holding spironolactone and furosemide - Monitor CMP - Avoid nephrotoxic medications as possible - Monitor fluid status closely - Strict I and O  Gastric neuroendocrine tumor: Acute, recurrent.  Previously diagnosed and biopsied with small margins via EGD in September.  However recently had an EGD on 12/20 with surgical pathology showing additional well differentiated neuroendocrine tumor extending to the edge of biopsy.  Follows with [facility] and Dr. Annamaria Boots with hematology/oncology. - Ensure this is addressed during GI follow-up  History of mitral valve replacement with mechanical valve: Chronic, stable. Chronically on anticoagulation with warfarin.  INR therapeutic, 2.65>2.8.  Goal 2.5-3.5 due to his mechanical valve.  - cardiology consult for management of warfarin/heparin - holding warfarin due to decreased Hgb.  - start heparin when INR lower than 2.5.  - Obtain echocardiogram - Warfarin per pharmacy  Mild protein-calorie malnutrition: Chronic.  Albumin 2.2.  Family states he has a decreased appetite. - Nutrition consult  Paroxysmal atrial fibrillation: Chronic, stable. Currently in sinus rhythm, HR 80-100.  On chronic warfarin therapy as above, not on any rate controlling regimen. - Monitor on telemetry - Warfarin per pharmacy.  Holding in setting of acute bleed.   History of prostate cancer: Stable.  Patient reports this was 2 decades ago, s/p treatment.  No current concerns. -Monitor outpatient  Chronic hypertension: Stable. SBP 100-130.  On diuretic therapy with spironolactone and torsemide has been controlling his pressure. - Holding home diuretic therapy in the setting of AKI, patient normotensive  Hyponatremia: Chronic.  Na 130>129, baseline appears around 131-135.  Difficult to assess whether this is related to fluid overload versus intravascularly depleted in  setting of diuresis. -Monitor CMP - Consider urine sodium, osmol if not improving  Vitamin B12 deficiency: Chronic, stable.  Follows with Dr. Annamaria Boots outpatient for injections monthly, last injection on 11/20.   - Consider vitamin B12 injection vs deferment to outpatient  Normocytic anemia: Chronic.  Previously megaloblastic, receiving B12 injections monthly.  Hemoglobin 8.7, baseline around 9-10. Likely in the setting of blood loss as above.  Asymptomatic. - Monitor CBC - Repeat CBC tonight   FEN/GI: NPO at midnight Prophylaxis: On warfarin therapy-we will monitor bleeding closely  Disposition: home   Medications: Scheduled Meds: . ezetimibe  10 mg Oral Daily  . pantoprazole (PROTONIX) IV  40 mg Intravenous Q12H  . rosuvastatin  40 mg Oral Daily  . Warfarin - Pharmacist Dosing Inpatient   Does not apply q1800   Continuous Infusions: . [START ON 03/12/2018] levofloxacin (LEVAQUIN) IV     PRN Meds: polyethylene glycol, polyvinyl alcohol  ================================================= ================================================= Subjective:  Patient sates he is not having abdominal pain today.  He has had a cough.  He states he feels weak and thought he was going to pass out when he had to urinate in the ED.    Objective: Temp:  [97.3 F (36.3 C)-98.5 F (36.9 C)] 97.3 F (36.3 C) (12/27 0810) Pulse Rate:  [50-102] 95 (12/27 0810) Resp:  [10-41] 18 (12/27 0519) BP: (98-131)/(47-86) 112/65 (12/27 0810) SpO2:  [95 %-100 %] 100 % (12/27 0810) Weight:  [82.5 kg-83.2 kg] 83.2 kg (12/27 0519) Intake/Output 12/26 0701 - 12/27 0700 In: 0  Out: 400 [Urine:400] Physical Exam:  Gen: NAD, alert, non-toxic,  well-nourished, sitting comfortably.  HEENT: Normocephaic, atraumatic. Clear conjuctiva, no scleral icterus and injection.  CV: Regular rate and rhythm.  Mechanical valve heard loudest over the mitral area.  Radial pulses 2+ bilaterally. Significant LE edema, 3_  going up to the knees.  Resp: Clear to auscultation bilaterally.  No wheezing, rales, abnormal lung sounds.  Patient became short of breath when adjusting in bed to sit up.  Abd:  Distended. Nontender on palpation to all 4 quadrants.  Positive bowel sounds. Psych: Cooperative with exam. Pleasant. Makes eye contact. Extremities: Full ROM    Laboratory: Recent Labs  Lab 03/10/18 1408 03/10/18 2003 03/11/18 0607  WBC 6.5  --  4.8  HGB 8.7* 7.5* 6.0*  HCT 27.2* 22.6* 18.2*  PLT 339  --  217   Recent Labs  Lab 03/10/18 1408 03/11/18 0607  NA 130* 129*  K 5.3* 5.4*  CL 98 96*  CO2 21* 24  BUN 70* 83*  CREATININE 3.42* 3.44*  CALCIUM 8.8* 8.3*  PROT 5.4* 4.2*  BILITOT 0.9 0.5  ALKPHOS 61 45  ALT 29 21  AST 35 22  GLUCOSE 150* 128*    Imaging/Diagnostic Tests: Dg Chest Left Decubitus  Result Date: 03/10/2018 CLINICAL DATA:  History of left lower lobe pneumonia, decubitus view EXAM: CHEST - LEFT DECUBITUS COMPARISON:  Films from earlier in the same day. FINDINGS: Left-side-down decubitus view shows large flowing pleural effusion. Postsurgical changes are again noted. IMPRESSION: Large flowing left pleural effusion. Electronically Signed   By: Inez Catalina M.D.   On: 03/10/2018 22:23   Dg Abdomen Acute W/chest  Result Date: 03/10/2018 CLINICAL DATA:  Shortness of breath, pain and swelling in abdomen, swelling in feet and lower legs, increased weakness, dry heaving, anorexia, food stuck in throat, history essential hypertension, no coronary artery disease post bypass, atrial fibrillation EXAM: DG ABDOMEN ACUTE W/ 1V CHEST COMPARISON:  Chest radiograph 01/03/2018, CT abdomen and pelvis 01/05/2018 FINDINGS: Upper normal size of cardiac silhouette post CABG and MVR. Atherosclerotic calcification aorta. LEFT pleural effusion and bibasilar atelectasis. Cannot exclude consolidation in LEFT lower lobe. Upper lungs clear. No pneumothorax. Nonobstructive bowel gas pattern. Increased  attenuation of abdomen with medial displacement of bowel consistent with ascites. No bowel wall thickening, evidence of obstruction, or free air. Bones demineralized. IMPRESSION: Ascites. LEFT pleural effusion and bibasilar atelectasis. Cannot exclude coexistent consolidation in LEFT lower lobe. Electronically Signed   By: Lavonia Dana M.D.   On: 03/10/2018 15:12      Benay Pike, MD 03/11/2018, 9:44 AM PGY-1, [place] Intern pager: [phone], text pages welcome

## 2018-03-11 NOTE — Progress Notes (Signed)
Initial Nutrition Assessment  DOCUMENTATION CODES:   Severe malnutrition in context of chronic illness  INTERVENTION:   -Boost Breeze po TID, each supplement provides 250 kcal and 9 grams of protein -30 ml Prostat TID, each supplement provides 100 kcals and 15 grams protein -MVI with minerals daily  NUTRITION DIAGNOSIS:   Severe Malnutrition related to chronic illness(chronic lt pleural effusions) as evidenced by severe fat depletion, severe muscle depletion, edema.  GOAL:   Patient will meet greater than or equal to 90% of their needs  MONITOR:   PO intake, Supplement acceptance, Diet advancement, Labs, Weight trends, Skin, I & O's  REASON FOR ASSESSMENT:   Consult, Malnutrition Screening Tool Assessment of nutrition requirement/status, Poor PO  ASSESSMENT:   Mario Powell is a 72 y.o. male presenting with increasing shortness of breath, ascites, darkened stools for the past few days, and found to have a possible LLL pneumonia on chest x-ray. PMH is significant for gastric neuroendocrine polyps, history of mitral valve replacement with mechanical valve on chronic anticoagulation, HTN, prostate cancer s/p treatment, paroxysmal atrial fibrillation, anasarca of unknown source, CKD, normocytic anemia, hyponatremia, and chronic left pleural effusion.   Pt admitted with shortness of breath, likely multifactorial (ascites, chronic lt pleural effusion, LL pneumonia, and anemia from GIB).   Per MD notes, plan EKG today. Per GI consult note, plan for paracentesis as needed and possible endoscopy early next week. Also recommending cardiology consultation.   Spoke with family members at bedside, who request that RD defer assessment at this time, as he just recently got comfortable. Pt with obvious severe fat and muscle depletions, as well as significant ascites and significant edema in lower extremities. Suspect chronic poor oral intake secondary to ascites/ early satiety.    Reviewed wt  hx, which reveals wt stability over the past 3 months. However, suspect ascites and edema is likely masking true weight loss and muscle depletions.   Pt was just advanced to clear liquid diet today; will add supplements to help maximize intake.   Labs reviewed.   NUTRITION - FOCUSED PHYSICAL EXAM:    Most Recent Value  Orbital Region  Severe depletion  Upper Arm Region  Severe depletion  Thoracic and Lumbar Region  Unable to assess  Buccal Region  Unable to assess  Temple Region  Severe depletion  Clavicle Bone Region  Severe depletion  Clavicle and Acromion Bone Region  Unable to assess  Scapular Bone Region  Unable to assess  Dorsal Hand  Unable to assess  Patellar Region  No depletion  Anterior Thigh Region  No depletion  Posterior Calf Region  No depletion  Edema (RD Assessment)  Severe  Hair  Reviewed  Eyes  Unable to assess  Mouth  Unable to assess  Skin  Reviewed  Nails  Unable to assess       Diet Order:   Diet Order            Diet clear liquid Room service appropriate? Yes; Fluid consistency: Thin  Diet effective now              EDUCATION NEEDS:   Not appropriate for education at this time  Skin:  Skin Assessment: Reviewed RN Assessment  Last BM:  03/10/18  Height:   Ht Readings from Last 1 Encounters:  03/10/18 5\' 11"  (1.803 m)    Weight:   Wt Readings from Last 1 Encounters:  03/11/18 83.2 kg    Ideal Body Weight:  78.2 kg  BMI:  Body mass index is 25.58 kg/m.  Estimated Nutritional Needs:   Kcal:  9432-7614  Protein:  120-135 grams  Fluid:  per MD    Sarah Baez A. Jimmye Norman, RD, LDN, CDE Pager: [phone] After hours Pager: [phone]

## 2018-03-11 NOTE — Progress Notes (Signed)
*  PRELIMINARY RESULTS* Echocardiogram 2D Echocardiogram has been performed.  Mario Powell 03/11/2018, 4:24 PM

## 2018-03-11 NOTE — Progress Notes (Signed)
Patient complains of constant belching whenever he eats or drinks. Patient states he normally has the same problem at home when he gets fluid build up in his belly.  Patient has paracentesis rescheduled for tomorrow morning. MD notified.  Marcille Blanco, RN

## 2018-03-11 NOTE — Progress Notes (Signed)
OT Cancellation Note  Patient Details Name: Mario Powell MRN: [id number] DOB: 08-29-1945   Cancelled Treatment:    Reason Eval/Treat Not Completed: Medical issues which prohibited therapy;Patient at procedure or test/ unavailable. Pt receiving blood per RN. OT will check back next available/appropriate time/day  Britt Bottom 03/11/2018, 10:58 AM

## 2018-03-11 NOTE — Progress Notes (Signed)
Pt. With critical Hb of 6.0. On call paged to make aware.

## 2018-03-11 NOTE — Consult Note (Addendum)
Cardiology Consultation:   Patient ID: Mario Powell MRN: [id number]; DOB: 1946/01/01  Admit date: 03/10/2018 Date of Consult: 03/11/2018  Primary Care Provider: Wilber Oliphant, MD Primary Cardiologist: Dr. Tamala Julian    Patient Profile:   Mario Powell is a 72 y.o. male with a hx of CAD s/p CABG, mechanical mitral valve, prior history of endocarditis, hypertension, chronic left greater than right lower extremity edema,  paroxysmal atrial fibrillation, coumadin for chronic anticoagulation, prostate cancer s/p treatment, gastric neuroendocrine tumor, recurrent ascitics and CKD stage IV who is being seen today for the evaluation of anticoagulation management  at the request of Dr. Paulita Fujita.   Hx of of CABG and mechanical MVR in 2011. He did not required any ischemic work up since then. Last seen by Dr. Tamala Julian 12/2016. Last echo 12/02/17 showed LVEF of 60-65%, Peak and mean gradients through the valve are 11 and 4 mm Hg respectively There is trival MR.  History of Present Illness:   Mr. Cuffe had multiple admission in past few months due to anasarca and ascitics. Biopsy in 11/2017 showed hyperplastic polyp with intestinal metaplasia, well-differentiated neuroendocrine tumor, grade 1 with low mitotic rate of less than 2% and KI-67 around 2%.  The other stomach biopsy for chronic gastritis with intestinal metaplasia, however no malignancy and H. pylori negative. Readmitted 12/2017 with recurrent symptoms. Bone marrow biopsy was non conclusive for pancytopenia. He underwent outpatient EGD 03/04/18 with polypectomies done. He was bridged with Lovenox.   Patient presented yesterday with worsening shortness of breath with and without activity, orthopnea, melena and syncope episode. Work up reveled possible LL pneumonia, acute blood loss anemia, acute on chronic ascites with worsening LE edema. On abx of pneumonia. Hgb of 8.7>>>7.5>>>6.0 today (was 11.8 on 02/02/18). Getting blood transfusion. INR of 2.8 today. He  got 0.88m of warfarin overnight, now on hold. Cardiology is asked for further evaluation and management. Scr of 3.44 (baseline between 2-3). Albumin 1.8. Troponin x 3 negative. TSH elevated, normal Free T4. CXR with large left pleural effusion.   Past Medical History:  Diagnosis Date  . Anasarca 12/01/2017  . [place] 07/12[place]   Perioperative, 2001   . CAD (coronary artery disease) of artery bypass graft 07/12/2013   Saphenous vein graft 2001   . Chronic anticoagulation 07/12/2013  . Essential hypertension 07/12/2013  . History of mitral valve replacement with mechanical valve 01/11/2013   Mechanical mitral valve 2001  Bacterial endocarditis as the cause   . HOH (hard of hearing)   . [place]state [place]    Past Surgical History:  Procedure Laterality Date  . BIOPSY  12/06/2017   Procedure: BIOPSY;  Surgeon: BOtis Brace MD;  [place] [facility]  Service: Gastroenterology;;  . BIOPSY  03/04/2018   Procedure: BIOPSY;  Surgeon: BOtis Brace MD;  Location: [facility];  Service: Gastroenterology;;  . COLONOSCOPY WITH PROPOFOL N/A 12/06/2017   Procedure: COLONOSCOPY WITH PROPOFOL ;  Surgeon: BOtis Brace MD;  [place] [facility]  Service: Gastroenterology;  Laterality: N/A;  . CORONARY ARTERY BYPASS GRAFT     2001  . ESOPHAGOGASTRODUODENOSCOPY (EGD) WITH PROPOFOL N/A 12/06/2017   Procedure: ESOPHAGOGASTRODUODENOSCOPY (EGD) WITH PROPOFOL;  Surgeon: BOtis Brace MD;  [facility]  Service: Gastroenterology;  Laterality: N/A;  . ESOPHAGOGASTRODUODENOSCOPY (EGD) WITH PROPOFOL N/A 03/04/2018   Procedure: ESOPHAGOGASTRODUODENOSCOPY (EGD) WITH PROPOFOL;  Surgeon: BOtis Brace MD;  Location: [facility];  Service: Gastroenterology;  Laterality: N/A;  . IR PARACENTESIS  12/03/2017  . IR PARACENTESIS  01/04/2018  . IR PARACENTESIS  02/04/2018  . MITRAL VALVE REPLACEMENT  2001   Mechanical prosthesis  . POLYPECTOMY   12/06/2017   Procedure: POLYPECTOMY;  Surgeon: Otis Brace, MD;  Location[facility];  Service: Gastroenterology;;  . POLYPECTOMY  03/04/2018   Procedure: POLYPECTOMY;  Surgeon: Otis Brace, MD;  Location: [facility];  Service: Gastroenterology;;    Inpatient Medications: Scheduled Meds: . ezetimibe  10 mg Oral Daily  . furosemide  40 mg Intravenous Once  . pantoprazole (PROTONIX) IV  40 mg Intravenous Q12H  . rosuvastatin  40 mg Oral Daily   Continuous Infusions: . [START ON 03/12/2018] levofloxacin (LEVAQUIN) IV     PRN Meds: polyethylene glycol, polyvinyl alcohol  Allergies:    Allergies  Allergen Reactions  . Penicillins Other (See Comments)    DID THE REACTION INVOLVE: Swelling of the face/tongue/throat, SOB, or low BP? Unknown Sudden or severe rash/hives, skin peeling, or the inside of the mouth or nose? Unknown Did it require medical treatment? Yes When did it last happen?When pt was 72 years old Pt knows that his parents had to take him to the ER at the hospital and has not been retested with any PCN or cephalosporin that he is aware of.    Social History:   Social History   Socioeconomic History  . Marital status: Widowed    Spouse name: Not on file  . Number of children: Not on file  . Years of education: Not on file  . Highest education level: Not on file  Occupational History  . Not on file  Social Needs  . Financial resource strain: Not on file  . Food insecurity:    Worry: Not on file    Inability: Not on file  . Transportation needs:    Medical: Not on file    Non-medical: Not on file  Tobacco Use  . Smoking status: Former Research scientist (life sciences)  . Smokeless tobacco: Never Used  . Tobacco comment: QUIT IN 09/1982  Substance and Sexual Activity  . Alcohol use: Not Currently    Comment: NO ALCOHOL SINCE CHRISTMAS 2017  . Drug use: Never  . Sexual activity: Not on file  Lifestyle  . Physical activity:    Days per week: Not on file    Minutes per  session: Not on file  . Stress: Not on file  Relationships  . Social connections:    Talks on phone: Not on file    Gets together: Not on file    Attends religious service: Not on file    Active member of club or organization: Not on file    Attends meetings of clubs or organizations: Not on file    Relationship status: Not on file  . Intimate partner violence:    Fear of current or ex partner: Not on file    Emotionally abused: Not on file    Physically abused: Not on file    Forced sexual activity: Not on file  Other Topics Concern  . Not on file  Social History Narrative   Daughters Judson Roch and Sharyn Lull   Works for [employer], mainly at desk all day.    Performs all ADLs    Family History:   Family History  Problem Relation Age of Onset  . Heart attack Father   . Healthy Mother   . Healthy Sister   . Healthy Sister      ROS:  Please see the history of present illness.  All other ROS reviewed and  negative.     Physical Exam/Data:   Vitals:   03/11/18 0519 03/11/18 0810 03/11/18 1010 03/11/18 1044  BP: 118/69 112/65 101/62 110/68  Pulse: 93 95 97 65  Resp: '18  18 18  ' Temp: 98.5 F (36.9 C) (!) 97.3 F (36.3 C) (!) 97.5 F (36.4 C) (!) 97.5 F (36.4 C)  TempSrc: Oral Oral Oral Oral  SpO2: 100% 100% 100% 97%  Weight: 83.2 kg     Height:        Intake/Output Summary (Last 24 hours) at 03/11/2018 1234 Last data filed at 03/11/2018 0300 Gross per 24 hour  Intake 0 ml  Output 400 ml  Net -400 ml   Filed Weights   03/10/18 1346 03/10/18 2031 03/11/18 0519  Weight: 82.5 kg 83.2 kg 83.2 kg   Body mass index is 25.58 kg/m.  General:  Thin frail ill appearing male in no acute distress HEENT: heard of hear Lymph: no adenopathy Neck: + JVD Endocrine:  No thryomegaly Vascular: No carotid bruits; FA pulses 2+ bilaterally without bruits  Cardiac:  normal S1, S2; RRR; Scrip murmur Lungs:  Diminished breath sound on L base Abd:  Tender and distended  Ext: 4-5+ BL LE  edema Musculoskeletal:  No deformities, BUE and BLE strength normal and equal Skin: warm and dry  Neuro:  CNs 2-12 intact, no focal abnormalities noted Psych:  Normal affect   EKG:  The EKG was personally reviewed and demonstrates:  SR with low voltage  Telemetry:  Telemetry was personally reviewed and demonstrates:  SR with rare PVCs  Relevant CV Studies:  Echo 12/02/17 Study Conclusions  - Left ventricle: The cavity size was normal. Wall thickness was   normal. Systolic function was normal. The estimated ejection   fraction was in the range of 60% to 65%. - Mitral valve: MV posthesis is well seated. Appears to move well.   Peak and mean gradients through the valve are 11 and 4 mm Hg   respectively There is trival MR. - Right ventricle: Systolic function was mildly reduced. - Pericardium, extracardiac: There was a right pleural effusion.   There was a left pleural effusion.  Impressions:  - Very poor acoustic windows limit study.  Laboratory Data:  Chemistry Recent Labs  Lab 03/10/18 1408 03/11/18 0607  NA 130* 129*  K 5.3* 5.4*  CL 98 96*  CO2 21* 24  GLUCOSE 150* 128*  BUN 70* 83*  CREATININE 3.42* 3.44*  CALCIUM 8.8* 8.3*  GFRNONAA 17* 17*  GFRAA 19* 19*  ANIONGAP 11 9    Recent Labs  Lab 03/10/18 1408 03/11/18 0607  PROT 5.4* 4.2*  ALBUMIN 2.2* 1.8*  AST 35 22  ALT 29 21  ALKPHOS 61 45  BILITOT 0.9 0.5   Hematology Recent Labs  Lab 03/10/18 1408 03/10/18 2003 03/11/18 0607  WBC 6.5  --  4.8  RBC 2.77*  --  1.87*  HGB 8.7* 7.5* 6.0*  HCT 27.2* 22.6* 18.2*  MCV 98.2  --  97.3  MCH 31.4  --  32.1  MCHC 32.0  --  33.0  RDW 15.2  --  15.6*  PLT 339  --  217   Cardiac Enzymes Recent Labs  Lab 03/10/18 1408 03/10/18 2003 03/11/18 0157 03/11/18 0607  TROPONINI <0.03 <0.03 <0.03 <0.03   No results for input(s): TROPIPOC in the last 168 hours.  BNP Recent Labs  Lab 03/10/18 1408  BNP 52.8    Radiology/Studies:  Dg Chest Left  Decubitus  Result Date: 03/10/2018 CLINICAL DATA:  History of left lower lobe pneumonia, decubitus view EXAM: CHEST - LEFT DECUBITUS COMPARISON:  Films from earlier in the same day. FINDINGS: Left-side-down decubitus view shows large flowing pleural effusion. Postsurgical changes are again noted. IMPRESSION: Large flowing left pleural effusion. Electronically Signed   By: Inez Catalina M.D.   On: 03/10/2018 22:23   Dg Abdomen Acute W/chest  Result Date: 03/10/2018 CLINICAL DATA:  Shortness of breath, pain and swelling in abdomen, swelling in feet and lower legs, increased weakness, dry heaving, anorexia, food stuck in throat, history essential hypertension, no coronary artery disease post bypass, atrial fibrillation EXAM: DG ABDOMEN ACUTE W/ 1V CHEST COMPARISON:  Chest radiograph 01/03/2018, CT abdomen and pelvis 01/05/2018 FINDINGS: Upper normal size of cardiac silhouette post CABG and MVR. Atherosclerotic calcification aorta. LEFT pleural effusion and bibasilar atelectasis. Cannot exclude consolidation in LEFT lower lobe. Upper lungs clear. No pneumothorax. Nonobstructive bowel gas pattern. Increased attenuation of abdomen with medial displacement of bowel consistent with ascites. No bowel wall thickening, evidence of obstruction, or free air. Bones demineralized. IMPRESSION: Ascites. LEFT pleural effusion and bibasilar atelectasis. Cannot exclude coexistent consolidation in LEFT lower lobe. Electronically Signed   By: Lavonia Dana M.D.   On: 03/10/2018 15:12    Assessment and Plan:   Mario Powell is a 72 y.o. male with a complex PHM of CAD s/p CABG, mechanical mitral valve, prior history of endocarditis, hypertension, chronic left greater than right lower extremity edema,  paroxysmal atrial fibrillation, coumadin for chronic anticoagulation, prostate cancer s/p treatment, gastric neuroendocrine tumor, recurrent ascitics and CKD stage IV. He had multiple recent admission for anasarca and ascitics. He  has non conclusive bone marrow biopsy. EGD 12/20 with polypectomies (bridged with Levanox).   Now presented with worsening shortness of breath, orthopnea, melena, syncope episode, abdominal distention and Le edema. Admitted for pneumonia, worsening ascites and lower extremity edema and down found to have acute GI bleed with trending down hemoglobin to 6 today from 8.7 on admission and 11.8 02/02/18.  It is possible that he has ongoing bleeding since his EGD with polypectomy last week.  Complex and critical situation.  He got warfarin 0.5 mg earlier this morning.  INR 2.8 today.  Low albumin with elevated serum creatinine concern for nephrotic syndrome as well.  Warfarin on hold.  Questionable anticoagulation with heparin while off warfarin when downtrending INR to prevent thromboembolic event given mechanical mitral valve and hx of paroxysmal atrial fibrillation.  Detailed plan per Dr. Acie Fredrickson.   For questions or updates, please contact [place] Please consult [email or web address] for contact info under     Jarrett Soho, [place]  03/11/2018 12:34 PM   Attending Note:   The patient was seen and examined.  Agree with assessment and plan as noted above.  Changes made to the above note as needed.  Patient seen and independently examined with  Robbie Lis, PA .   We discussed all aspects of the encounter. I agree with the assessment and plan as stated above.  1.  Status post mechanical mitral valve: The patient has a history of coronary artery bypass grafting and mechanical mitral valve replacement.  Unfortunately, he has been diagnosed with a gastric neuroendocrine tumor.  He had biopsies several weeks ago and now appears to be bleeding from these biopsies.  His hemoglobin has fallen 2.5  g overnight.  He is currently getting a transfusion.  INR is 2.8 today which is at the lower end of  therapeutic level for mechanical mitral valves.  He is at extremely high risk for thromboembolic  complications. His overall health is quite poor. His albumin is 1.8.  Creatinine is 3.44.  We will obviously need to hold the Coumadin for now.  I think that we can safely hold it for 2 to 3 days and perhaps as long as 4 days. At Some point will need to initiate heparin Lovenox bridge.   I would place him in SCDs for now which may help reduce the risk of venous rhombus formation.  Unfortunately, I think that his prognosis is relatively poor.  He is markedly now malnourished and has stage IV chronic kidney disease.  He also has a neuroendocrine tumor.  He has marked ascites and will need to have a paracentesis today.   I have spent a total of 40 minutes with patient reviewing hospital  notes , telemetry, EKGs, labs and examining patient as well as establishing an assessment and plan that was discussed with the patient. > 50% of time was spent in direct patient care.  Will follow with you   Ramond Dial., MD, [facility] 03/11/2018, 1:46 PM [address],  [place] Pager [phone]

## 2018-03-11 NOTE — Consult Note (Signed)
[facility] Gastroenterology Consultation Note  Referring Provider: Dr. Erin Hearing (MTS) Primary Care Physician:  Wilber Oliphant, MD Primary Gastroenterologist:  Dr. Otis Brace  Reason for Consultation:  GI bleeding  HPI: Mario Powell is a 72 y.o. male presenting with syncope.  History gastric neuroendocrine tumor with endoscopy one week ago with polypectomies done.  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  Over past few days, patient with melena and then passed out yesterday at home.  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  Patient has had progressive shortness-of-breath and worsening ascites and worsening peripheral edema.  Weight close to baseline, but family notes that he seems to be losing muscle mass.   Past Medical History:  Diagnosis Date  . Anasarca 12/01/2017  . Atrial fibrillation ([place]) 07/12/2013   Perioperative, 2001   . CAD (coronary artery disease) of artery bypass graft 07/12/2013   Saphenous vein graft 2001   . Chronic anticoagulation 07/12/2013  . Essential hypertension 07/12/2013  . History of mitral valve replacement with mechanical valve 01/11/2013   Mechanical mitral valve 2001  Bacterial endocarditis as the cause   . HOH (hard of hearing)   . Prostate cancer ([place]) 07/12/2013    Past Surgical History:  Procedure Laterality Date  . BIOPSY  12/06/2017   Procedure: BIOPSY;  Surgeon: Otis Brace, MD;  Location: [place];  Service: Gastroenterology;;  . BIOPSY  03/04/2018   Procedure: BIOPSY;  Surgeon: Otis Brace, MD;  Location: [facility];  Service: Gastroenterology;;  . COLONOSCOPY WITH PROPOFOL N/A 12/06/2017   Procedure: COLONOSCOPY WITH PROPOFOL ;  Surgeon: Otis Brace, MD;  Location: [place];  Service: Gastroenterology;  Laterality: N/A;  . CORONARY ARTERY BYPASS GRAFT     2001  . ESOPHAGOGASTRODUODENOSCOPY (EGD) WITH PROPOFOL N/A 12/06/2017   Procedure:  ESOPHAGOGASTRODUODENOSCOPY (EGD) WITH PROPOFOL;  Surgeon: Otis Brace, MD;  Location: [place];  Service: Gastroenterology;  Laterality: N/A;  . ESOPHAGOGASTRODUODENOSCOPY (EGD) WITH PROPOFOL N/A 03/04/2018   Procedure: ESOPHAGOGASTRODUODENOSCOPY (EGD) WITH PROPOFOL;  Surgeon: Otis Brace, MD;  Location: [facility];  Service: Gastroenterology;  Laterality: N/A;  . IR PARACENTESIS  12/03/2017  . IR PARACENTESIS  01/04/2018  . IR PARACENTESIS  02/04/2018  . MITRAL VALVE REPLACEMENT  2001   Mechanical prosthesis  . POLYPECTOMY  12/06/2017   Procedure: POLYPECTOMY;  Surgeon: Otis Brace, MD;  Location: [facility];  Service: Gastroenterology;;  . POLYPECTOMY  03/04/2018   Procedure: POLYPECTOMY;  Surgeon: Otis Brace, MD;  Location: [facility];  Service: Gastroenterology;;    Prior to Admission medications   Medication Sig Start Date End Date Taking? Authorizing Provider  Amino Acids-Protein Hydrolys (FEEDING SUPPLEMENT, PRO-STAT SUGAR FREE 64,) LIQD Take 30 mLs by mouth 3 (three) times daily with meals. Patient taking differently: Take 30 mLs by mouth every evening.  12/09/17  Yes Wilber Oliphant, MD  bismuth subsalicylate (PEPTO BISMOL) 262 MG/15ML suspension Take 30 mLs by mouth as needed for indigestion.   Yes [provider]  calcium-vitamin D (OSCAL WITH D) 500-200 MG-UNIT tablet Take 1 tablet by mouth 3 (three) times daily.    Yes [provider]  Camphor-Eucalyptus-Menthol (VICKS VAPORUB EX) Apply 1 application topically See admin instructions. Apply under nose daily in the evening   Yes [provider]  Cholecalciferol (VITAMIN D-3) 25 MCG (1000 UT) CAPS Take 1,000 Units by mouth daily.   Yes [provider]  enoxaparin (LOVENOX) 80 MG/0.8ML injection Inject 0.8 mLs (80  mg total) into the skin every 12 (twelve) hours. As Instructed by Coumadin Clinic 02/25/18  Yes Belva Crome, MD  ezetimibe (ZETIA) 10 MG tablet Take 1  tablet (10 mg total) by mouth daily. PLEASE CALL TO SCHEDULE F/U APPT FOR FUTURE REFILLS.Marland Kitchen3RD AND FINAL ATTEMPT! 02/25/18  Yes Belva Crome, MD  medium chain triglycerides (MCT OIL) oil Take 15 mLs by mouth 3 (three) times daily with meals. Patient taking differently: Take 15 mLs by mouth at bedtime.  12/08/17  Yes Wilber Oliphant, MD  Multiple Vitamin (MULTIVITAMIN WITH MINERALS) TABS tablet Take 1 tablet by mouth daily. 12/09/17  Yes Wilber Oliphant, MD  Naphazoline-Pheniramine (OPCON-A) 0.027-0.315 % SOLN Place 2-3 drops into both eyes daily as needed (for dry eyes).   Yes [provider]  Omega-3 Fatty Acids (FISH OIL) 1000 MG CAPS Take 2,000 mg by mouth daily.   Yes [provider]  pantoprazole (PROTONIX) 40 MG tablet Take 1 tablet (40 mg total) by mouth daily. 02/15/18  Yes Wilber Oliphant, MD  rosuvastatin (CRESTOR) 40 MG tablet Take 1 tablet (40 mg total) by mouth daily. PLEASE CALL TO SCHEDULE F/U APPT FOR FUTURE REFILLS.Marland Kitchen3RD AND FINAL ATTEMPT! 02/25/18  Yes Belva Crome, MD  spironolactone (ALDACTONE) 100 MG tablet Take 1 tablet (100 mg total) by mouth daily. 01/27/18  Yes Wilber Oliphant, MD  torsemide (DEMADEX) 20 MG tablet Take 1 tablet (20 mg total) by mouth daily. 01/27/18  Yes Wilber Oliphant, MD  warfarin (COUMADIN) 1 MG tablet TAKE AS DIRECTED BY ANTICOAGULATION CLINIC Patient taking differently: Take 0.5-1 mg by mouth See admin instructions. Take 0.5 mg by mouth daily on Monday, Wednesday and Friday. Take 1 mg by mouth daily on all other days. 01/24/18  Yes Belva Crome, MD    Current Facility-Administered Medications  Medication Dose Route Frequency Provider Last Rate Last Dose  . ezetimibe (ZETIA) tablet 10 mg  10 mg Oral Daily Beard, Samantha N, DO   10 mg at 03/11/18 0136  . [START ON 03/12/2018] levofloxacin (LEVAQUIN) IVPB 750 mg  750 mg Intravenous Q48H Beard, Samantha N, DO      . pantoprazole (PROTONIX) injection 40 mg  40 mg Intravenous Q12H Beard, Samantha  N, DO   40 mg at 03/11/18 0136  . polyethylene glycol (MIRALAX / GLYCOLAX) packet 17 g  17 g Oral Daily PRN Higinio Plan, Samantha N, DO      . polyvinyl alcohol (LIQUIFILM TEARS) 1.4 % ophthalmic solution 2-3 drop  2-3 drop Both Eyes Daily PRN Chambliss, Marshall L, MD      . rosuvastatin (CRESTOR) tablet 40 mg  40 mg Oral Daily Beard, Samantha N, DO   40 mg at 03/11/18 0136  . Warfarin - Pharmacist Dosing Inpatient   Does not apply q1800 Franky Macho, [facility]        Allergies as of 03/10/2018 - Review Complete 03/10/2018  Allergen Reaction Noted  . Penicillins Other (See Comments) 07/12/2013    Family History  Problem Relation Age of Onset  . Heart attack Father   . Healthy Mother   . Healthy Sister   . Healthy Sister     Social History   Socioeconomic History  . Marital status: Widowed    Spouse name: Not on file  . Number of children: Not on file  . Years of education: Not on file  . Highest education level: Not on file  Occupational History  . Not on file  Social  Needs  . Financial resource strain: Not on file  . Food insecurity:    Worry: Not on file    Inability: Not on file  . Transportation needs:    Medical: Not on file    Non-medical: Not on file  Tobacco Use  . Smoking status: Former Research scientist (life sciences)  . Smokeless tobacco: Never Used  . Tobacco comment: QUIT IN 09/1982  Substance and Sexual Activity  . Alcohol use: Not Currently    Comment: NO ALCOHOL SINCE CHRISTMAS 2017  . Drug use: Never  . Sexual activity: Not on file  Lifestyle  . Physical activity:    Days per week: Not on file    Minutes per session: Not on file  . Stress: Not on file  Relationships  . Social connections:    Talks on phone: Not on file    Gets together: Not on file    Attends religious service: Not on file    Active member of club or organization: Not on file    Attends meetings of clubs or organizations: Not on file    Relationship status: Not on file  . Intimate partner violence:    Fear  of current or ex partner: Not on file    Emotionally abused: Not on file    Physically abused: Not on file    Forced sexual activity: Not on file  Other Topics Concern  . Not on file  Social History Narrative   Daughters Judson Roch and Sharyn Lull   Works for [employer], mainly at desk all day.    Performs all ADLs    Review of Systems: As per HPI, all others negative  Physical Exam: Vital signs in last 24 hours: Temp:  [97.3 F (36.3 C)-98.5 F (36.9 C)] 97.5 F (36.4 C) (12/27 1010) Pulse Rate:  [50-102] 97 (12/27 1010) Resp:  [10-41] 18 (12/27 1010) BP: (98-131)/(47-86) 101/62 (12/27 1010) SpO2:  [95 %-100 %] 100 % (12/27 1010) Weight:  [82.5 kg-83.2 kg] 83.2 kg (12/27 0519) Last BM Date: 03/10/18 General:   Alert,  tachypneic-appearing, chronically ill-appearing but NAD Head:  Normocephalic and atraumatic. Eyes:  Sclera clear, no icterus.   Conjunctiva pink. Ears:  Normal auditory acuity. Nose:  No deformity, discharge,  or lesions. Mouth:  No deformity or lesions.  Oropharynx pale and dry Neck:  Supraclavicular wasting, but Supple; no masses or thyromegaly. Lungs:  Clear throughout to auscultation.   No wheezes, crackles, or rhonchi. Some tachypneia Heart:  Regular rate and rhythm; no murmurs, audible Mitral Valve click; no rubs,  or gallops. Abdomen:  Soft, nontender and mild-to-moderate distention, small reducible umbilical hernia. No masses, hepatosplenomegaly or hernias noted. Normal bowel sounds, without guarding, and without rebound.    Msk:  Symmetrical without gross deformities. Normal posture. Pulses:  Normal pulses noted. Extremities:  Diffuse symmetrical muscular wasting, 3+ significant bilateral lower extremity edema Neurologic:  Alert and  oriented x4; diffusely weak, otherwise grossly normal neurologically. Skin:  Scattered ecchymoses, otherwise Intact without significant lesions or rashes. Psych:  Alert and cooperative. Normal mood and affect.   Lab Results: Recent  Labs    03/10/18 1408 03/10/18 2003 03/11/18 0607  WBC 6.5  --  4.8  HGB 8.7* 7.5* 6.0*  HCT 27.2* 22.6* 18.2*  PLT 339  --  217   BMET Recent Labs    03/10/18 1408 03/11/18 0607  NA 130* 129*  K 5.3* 5.4*  CL 98 96*  CO2 21* 24  GLUCOSE 150* 128*  BUN 70* 83*  CREATININE 3.42* 3.44*  CALCIUM 8.8* 8.3*   LFT Recent Labs    03/11/18 0607  PROT 4.2*  ALBUMIN 1.8*  AST 22  ALT 21  ALKPHOS 45  BILITOT 0.5   PT/INR Recent Labs    03/10/18 1533 03/11/18 0607  LABPROT 27.9* 29.1*  INR 2.65 2.80    Studies/Results: Dg Chest Left Decubitus  Result Date: 03/10/2018 CLINICAL DATA:  History of left lower lobe pneumonia, decubitus view EXAM: CHEST - LEFT DECUBITUS COMPARISON:  Films from earlier in the same day. FINDINGS: Left-side-down decubitus view shows large flowing pleural effusion. Postsurgical changes are again noted. IMPRESSION: Large flowing left pleural effusion. Electronically Signed   By: Inez Catalina M.D.   On: 03/10/2018 22:23   Dg Abdomen Acute W/chest  Result Date: 03/10/2018 CLINICAL DATA:  Shortness of breath, pain and swelling in abdomen, swelling in feet and lower legs, increased weakness, dry heaving, anorexia, food stuck in throat, history essential hypertension, no coronary artery disease post bypass, atrial fibrillation EXAM: DG ABDOMEN ACUTE W/ 1V CHEST COMPARISON:  Chest radiograph 01/03/2018, CT abdomen and pelvis 01/05/2018 FINDINGS: Upper normal size of cardiac silhouette post CABG and MVR. Atherosclerotic calcification aorta. LEFT pleural effusion and bibasilar atelectasis. Cannot exclude consolidation in LEFT lower lobe. Upper lungs clear. No pneumothorax. Nonobstructive bowel gas pattern. Increased attenuation of abdomen with medial displacement of bowel consistent with ascites. No bowel wall thickening, evidence of obstruction, or free air. Bones demineralized. IMPRESSION: Ascites. LEFT pleural effusion and bibasilar atelectasis. Cannot  exclude coexistent consolidation in LEFT lower lobe. Electronically Signed   By: Lavonia Dana M.D.   On: 03/10/2018 15:12    Impression:  1.  Melena with acute blood loss anemia.  Suspect bleeding from gastric polypectomies done one week ago, in face of ongoing anticoagulation. 2.  Shortness of breath.  Likely multifactorial:  Ascites, pleural effusions, acute worsening of anemia. 3.  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  Does not appear to be due to any intrinsic liver disease. 4.  Mechanoprosthetic mitral valve replacement.   5.  Chronic anticoagulation. 6.  Food sensation in throat.  Now resolved.  7.  Edema.  Likely at least in part from profound protein calorie malnutrition. 8.  Protein calorie malnutrition.  Plan:  1.  Hold anticoagulation. 2.  PPI. 3.  Blood transfusion. 4.  Paracentesis, therapeutically as needed.  Adjustments of diuretics highly unlikely to help this.  5.  Antibiotics (cipro), GI bleeding in setting of ascites. 6.  Patient is in no shape whatsoever for endoscopy at the present time; patient will need INR <2 and shortness of breath to be significantly improved. 7.  Suggest primary team obtain cardiology consultation to guide anticoagulation management in short term.   8.  Would plan on endoscopy tentatively early next week. 9.  Clear liquids today ok.   LOS: 1 day   Cagney Degrace M  03/11/2018, 10:47 AM  Cell [phone] If no answer or after 5 PM call [phone]

## 2018-03-12 ENCOUNTER — Inpatient Hospital Stay (HOSPITAL_COMMUNITY): Payer: Medicare Other

## 2018-03-12 DIAGNOSIS — I2510 Atherosclerotic heart disease of native coronary artery without angina pectoris: Secondary | ICD-10-CM

## 2018-03-12 DIAGNOSIS — N184 Chronic kidney disease, stage 4 (severe): Secondary | ICD-10-CM

## 2018-03-12 DIAGNOSIS — I480 Paroxysmal atrial fibrillation: Secondary | ICD-10-CM

## 2018-03-12 DIAGNOSIS — N179 Acute kidney failure, unspecified: Secondary | ICD-10-CM

## 2018-03-12 DIAGNOSIS — R0602 Shortness of breath: Secondary | ICD-10-CM

## 2018-03-12 DIAGNOSIS — K921 Melena: Secondary | ICD-10-CM

## 2018-03-12 LAB — CBC
HCT: 23.5 % — ABNORMAL LOW (ref 39.0–52.0)
HEMATOCRIT: 25.2 % — AB (ref 39.0–52.0)
Hemoglobin: 8.2 g/dL — ABNORMAL LOW (ref 13.0–17.0)
Hemoglobin: 7.7 g/dL — ABNORMAL LOW (ref 13.0–17.0)
MCH: 30.1 pg (ref 26.0–34.0)
MCH: 30.7 pg (ref 26.0–34.0)
MCHC: 32.5 g/dL (ref 30.0–36.0)
MCHC: 32.8 g/dL (ref 30.0–36.0)
MCV: 92.6 fL (ref 80.0–100.0)
MCV: 93.6 fL (ref 80.0–100.0)
PLATELETS: 181 10*3/uL (ref 150–400)
Platelets: 215 10*3/uL (ref 150–400)
RBC: 2.72 MIL/uL — ABNORMAL LOW (ref 4.22–5.81)
RBC: 2.51 MIL/uL — ABNORMAL LOW (ref 4.22–5.81)
RDW: 17.4 % — AB (ref 11.5–15.5)
RDW: 17.5 % — ABNORMAL HIGH (ref 11.5–15.5)
WBC: 5.5 10*3/uL (ref 4.0–10.5)
WBC: 3.9 10*3/uL — ABNORMAL LOW (ref 4.0–10.5)
nRBC: 0 % (ref 0.0–0.2)
nRBC: 0 % (ref 0.0–0.2)

## 2018-03-12 LAB — COMPREHENSIVE METABOLIC PANEL
ALT: 22 U/L (ref 0–44)
AST: 26 U/L (ref 15–41)
Albumin: 2.1 g/dL — ABNORMAL LOW (ref 3.5–5.0)
Alkaline Phosphatase: 54 U/L (ref 38–126)
Anion gap: 10 (ref 5–15)
BUN: 93 mg/dL — AB (ref 8–23)
CO2: 24 mmol/L (ref 22–32)
Calcium: 8.4 mg/dL — ABNORMAL LOW (ref 8.9–10.3)
Chloride: 97 mmol/L — ABNORMAL LOW (ref 98–111)
Creatinine, Ser: 3.59 mg/dL — ABNORMAL HIGH (ref 0.61–1.24)
GFR calc Af Amer: 18 mL/min — ABNORMAL LOW (ref >60)
GFR calc non Af Amer: 16 mL/min — ABNORMAL LOW (ref >60)
Glucose, Bld: 129 mg/dL — ABNORMAL HIGH (ref 70–99)
Potassium: 4.9 mmol/L (ref 3.5–5.1)
Sodium: 131 mmol/L — ABNORMAL LOW (ref 135–145)
Total Bilirubin: 0.5 mg/dL (ref 0.3–1.2)
Total Protein: 4.7 g/dL — ABNORMAL LOW (ref 6.5–8.1)

## 2018-03-12 LAB — LACTATE DEHYDROGENASE, PLEURAL OR PERITONEAL FLUID: LD, Fluid: 120 U/L — ABNORMAL HIGH (ref 3–23)

## 2018-03-12 LAB — PROTIME-INR
INR: 2.49
Prothrombin Time: 26.6 seconds — ABNORMAL HIGH (ref 11.4–15.2)

## 2018-03-12 LAB — BODY FLUID CELL COUNT WITH DIFFERENTIAL
Lymphs, Fluid: 13 %
Monocyte-Macrophage-Serous Fluid: 75 % (ref 50–90)
NEUTROPHIL FLUID: 12 % (ref 0–25)
Total Nucleated Cell Count, Fluid: 342 cu mm (ref 0–1000)

## 2018-03-12 LAB — GRAM STAIN

## 2018-03-12 LAB — ALBUMIN, PLEURAL OR PERITONEAL FLUID: Albumin, Fluid: 1.4 g/dL

## 2018-03-12 LAB — PROTEIN, PLEURAL OR PERITONEAL FLUID: Total protein, fluid: <3 g/dL

## 2018-03-12 LAB — GLUCOSE, PLEURAL OR PERITONEAL FLUID: Glucose, Fluid: 117 mg/dL

## 2018-03-12 LAB — HEPARIN LEVEL (UNFRACTIONATED): Heparin Unfractionated: 0.42 IU/mL (ref 0.30–0.70)

## 2018-03-12 LAB — AMYLASE, PLEURAL OR PERITONEAL FLUID: Amylase, Fluid: 97 U/L

## 2018-03-12 LAB — T3, FREE: T3, Free: 1.7 pg/mL — ABNORMAL LOW (ref 2.0–4.4)

## 2018-03-12 MED ORDER — LIDOCAINE HCL (PF) 1 % IJ SOLN
INTRAMUSCULAR | Status: AC
  Filled 2018-03-12: qty 30

## 2018-03-12 MED ORDER — FUROSEMIDE 10 MG/ML IJ SOLN
20.0000 mg | Freq: Every day | INTRAMUSCULAR | Status: DC
  Administered 2018-03-12 – 2018-03-14 (×3): 20 mg via INTRAVENOUS
  Filled 2018-03-12 (×4): qty 2

## 2018-03-12 MED ORDER — HEPARIN (PORCINE) 25000 UT/250ML-% IV SOLN
1000.0000 [IU]/h | INTRAVENOUS | Status: DC
  Administered 2018-03-12: 1000 [IU]/h via INTRAVENOUS
  Filled 2018-03-12: qty 250

## 2018-03-12 MED ORDER — FUROSEMIDE 10 MG/ML IJ SOLN: 60.0000 mg | Freq: Every day | INTRAMUSCULAR | Status: DC

## 2018-03-12 MED ORDER — ONDANSETRON HCL 4 MG/2ML IJ SOLN
4.0000 mg | Freq: Once | INTRAMUSCULAR | Status: AC
  Administered 2018-03-12: 4 mg via INTRAVENOUS
  Filled 2018-03-12: qty 2

## 2018-03-12 MED ORDER — SPIRONOLACTONE 25 MG PO TABS
25.0000 mg | ORAL_TABLET | Freq: Every day | ORAL | Status: DC
  Administered 2018-03-12 – 2018-03-15 (×4): 25 mg via ORAL
  Filled 2018-03-12 (×4): qty 1

## 2018-03-12 MED ORDER — LEVOFLOXACIN 750 MG PO TABS
750.0000 mg | ORAL_TABLET | ORAL | Status: AC
  Administered 2018-03-12 – 2018-03-16 (×3): 750 mg via ORAL
  Filled 2018-03-12 (×4): qty 1

## 2018-03-12 NOTE — Progress Notes (Signed)
OT Cancellation Note  Patient Details Name: Mario Powell MRN: [id number] DOB: [birth year]   Cancelled Treatment:    Reason Eval/Treat Not Completed: Patient at procedure or test/ unavailable(Off floor getting paracentesis )  [name] 03/12/2018, 12:38 PM  Hulda Humphrey OTR/L [place] Pager: [phone] Office: [phone]

## 2018-03-12 NOTE — Procedures (Signed)
  PROCEDURE SUMMARY:  Successful US guided paracentesis from RLQ.  Yielded 9 L of chylous yellow fluid.  No immediate complications.  Pt tolerated well.   Specimen was sent for labs.  EBL < 47mL  Ascencion Dike PA-C 03/12/2018 11:43 AM

## 2018-03-12 NOTE — Progress Notes (Signed)
PT Cancellation Note  Patient Details Name: Mario Powell MRN: [id number] DOB: 1945-08-21   Cancelled Treatment:    Reason Eval/Treat Not Completed: Medical issues which prohibited therapy. Pt NPO awaiting paracentesis. Spoke with pt. Pt very pleasant but anxious for procedure. Pt reports inability to participate in eval until he is feeling better. PT to re-attempt as time allows.   Lorriane Shire 03/12/2018, 9:54 AM   Lorrin Goodell, PT  Office # [phone] Pager [phone]

## 2018-03-12 NOTE — Progress Notes (Signed)
[place] for heparin Indication: heparin bridge while INR < 2.5  Allergies  Allergen Reactions  . Penicillins Other (See Comments)    DID THE REACTION INVOLVE: Swelling of the face/tongue/throat, SOB, or low BP? Unknown Sudden or severe rash/hives, skin peeling, or the inside of the mouth or nose? Unknown Did it require medical treatment? Yes When did it last happen?When pt was 72 years old Pt knows that his parents had to take him to the ER at the hospital and has not been retested with any PCN or cephalosporin that he is aware of.    Patient Measurements: Height: 5\' 11"  (180.3 cm) Weight: 184 lb 1.4 oz (83.5 kg) IBW/kg (Calculated) : 75.3 Heparin Dosing Weight: 83.2  Vital Signs: Temp: 97.4 F (36.3 C) (12/28 1909) Temp Source: Oral (12/28 1909) BP: 102/59 (12/28 1909) Pulse Rate: 91 (12/28 1909)  Labs: Recent Labs    03/10/18 1408 03/10/18 1533 03/10/18 2003 03/11/18 0157 03/11/18 0607 03/11/18 1621 03/12/18 0346 03/12/18 1433 03/12/18 2049  HGB 8.7*  --  7.5*  --  6.0* 8.7* 8.2* 7.7*  --   HCT 27.2*  --  22.6*  --  18.2* 26.4* 25.2* 23.5*  --   PLT 339  --   --   --  217 235 215 181  --   LABPROT  --  27.9*  --   --  29.1*  --  26.6*  --   --   INR  --  2.65  --   --  2.80  --  2.49  --   --   HEPARINUNFRC  --   --   --   --   --   --   --   --  0.42  CREATININE 3.42*  --   --   --  3.44*  --  3.59*  --   --   TROPONINI <0.03  --  <0.03 <0.03 <0.03  --   --   --   --     Estimated Creatinine Clearance: 19.2 mL/min (A) (by C-G formula based on SCr of 3.59 mg/dL (H)).   Medical History: Past Medical History:  Diagnosis Date  . Anasarca 12/01/2017  . Atrial fibrillation ([place]) 07/12/2013   Perioperative, 2001   . CAD (coronary artery disease) of artery bypass graft 07/12/2013   Saphenous vein graft 2001   . Chronic anticoagulation 07/12/2013  . Essential hypertension 07/12/2013  . History of mitral valve replacement with  mechanical valve 01/11/2013   Mechanical mitral valve 2001  Bacterial endocarditis as the cause   . HOH (hard of hearing)   . Prostate cancer ([place]) 07/12/2013    Medications:  Scheduled:  . ezetimibe  10 mg Oral Daily  . feeding supplement  1 Container Oral TID BM  . feeding supplement (PRO-STAT SUGAR FREE 64)  30 mL Oral TID BM  . furosemide  20 mg Intravenous Daily  . levofloxacin  750 mg Oral Q48H  . lidocaine (PF)      . multivitamin with minerals  1 tablet Oral Daily  . pantoprazole (PROTONIX) IV  40 mg Intravenous Q12H  . rosuvastatin  40 mg Oral Daily  . spironolactone  25 mg Oral Daily    Assessment: 1 yoM admitted 12/26 with complains of shortness of breath and dark stools. PTA patient is on warfarin for mechanical MVR and atrial fibrillation. Warfarin has been held since 12/27 (last dose 12/27 at 0135) since patient had a GI bleed  requiring transfusion. Today patient's INR has continue to trend down to 2.49, so pharmacy has been consulted to initiate heparin bridge while patient is off warfarin. Of note, patient had paracentesis 12/28 AM with no reported signs of bleeding.   Initial heparin level is therapeutic at 0.42, on 1000 units/hr. Hgb 7.7, plt 181. No s/sx of bleeding. No infusion issues.   PTA warfarin dose: 1mg  daily except for 0.5mg  on M/W/F   Goal of Therapy:  Heparin level 0.3-0.7 units/ml INR 2.5 - 3.5 Monitor platelets by anticoagulation protocol: Yes   Plan:  Continue heparin infusion at 1000 units/hr  Check daily heparin level and CBC Monitor closely for s/sx of bleeding Follow-up plans for transitioning back to warfarin  Thank you for allowing pharmacy to be a part of this patient's care.  Antonietta Jewel, PharmD, [place] Clinical Pharmacist  Pager: [phone] Phone: [phone] Please check AMION for all [place] phone numbers 03/12/2018,10:10 PM

## 2018-03-12 NOTE — Progress Notes (Signed)
Off floor getting paracentesis today.  I will see tomorrow; consider EGD early next week, pending INR and overall respiratory status.

## 2018-03-12 NOTE — Discharge Summary (Signed)
[facility] Discharge Summary  Patient name: Mario Powell Medical record number: [id number] Date of birth: 1945-04-03 Age: 72 y.o. Gender: male Date of Admission: 03/10/2018  Date of Discharge: 03/22/2018 Admitting Physician: Lind Covert, MD  Primary Care Provider: Wilber Oliphant, MD Consultants: nephrology, GI  Indication for Hospitalization: SOB  Discharge Diagnoses/Problem List:  Severe chronic hyponatremia Acute on chronic anemia Anasarca with ascites AKI on CKD Gastric neuroendocrine tumor Bioprosthetic mitral valve Severe protein calorie malnutrition Paroxysmal atrial fibrillation History of prostate cancer Chronic hypertension Vitamin B-12 deficiency  Disposition: Home with home health  Discharge Condition: Stable  Discharge Exam:   General: 72 y.o. 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  CXR indicated pneumonia.  Patient was started on Levaquin. Ultrasound guided paracentesis was performed and 9 L of chylous fluid was removed.  This improved the patient's respiratory symptoms significantly.  Patient has required multiple paracenteses at this point.  During a previous hospitalization, he was recommended to follow-up outpatient for potential lymphangiography.  He has failed to do so with subsequent reaccumulation of his ascites.  IR was consulted on 1/2 to weigh in regarding lymphangiography.  An appointment was scheduled with [facility] as  outpatient.  Referral also placed to [facility] and they will be contacting patient to set up appointment.  [facility] also consulted for 2nd opinion given history of neuroendocrine tumor and chylous ascites.  Patient received albumin during hospitalization as well as nephrology believed that his chylous ascites was likely also being affected by hypoalbuminemia.  BP and Na improved following albumin and he was able to maintain both without continued albumin administration prior to discharge. Na on discharge was 124.  GI bleed - patient presented after having two days of melanic stool and was found to have a Hgb of 8.7 on admission. This continued to decrease to 6.0 and was subsequently transfused a total of 4u pRBC over the course of the admission.  Anticoagulation was held then resumed (home coumadin) after GI performed EGD on 12/31 which showed multiple non-bleeding gastric ulcers and normal esophagus. Warfarin was bridged with coumadin and INR was in therapeutic range at the time of discharge.  His Hgb was stable and he was tolerating a regular diet at the time of discharge.  INR was in therapeutic range.  He was discharged home on his home dose of warfarin and had appointment for INR check scheduled 1/9.  Chronic kidney disease with hyponatremia -he has a history of chronic kidney disease with a baseline creatinine of 2.2.  Admission creatinine was 3.4 and with moderate fluid resuscitation that decreased to 2.3.  During the course of his hospitalization he was found to be significantly hyponatremic beyond his chronic hyponatremia (baseline hyponatremia 130-134).  Lowest sodium so far is 121.  Serum osmolality 279, urine osmolality 431.  Nephrology was consulted to help with management. They stopped his diuretic and placed him on a 1259mL fluid restriction with a 2g sodium diet.  He also received albumin and Na improved.  He  was able to demonstrate that Na could be maintained at improved level of 124 prior to  discharge.  Per nephrology, is hypervolemic hyponatremia with intravascular volume depletion, severe hypoalbuminemia, recurrent chylous ascites. Nephrology recommends that in the future the patient receive IV albumin 12.5g per L of ascites removed.  Community-acquired pneumonia-initial presentation with shortness of breath and radiographic findings consistent with CAD led to a 7-day course of Levaquin which was completed on 1/1.  His infectious symptoms appear to have resolved he had no further indication of infection during hospitalization.      Issues for Follow Up:  1. Lymphangiography-if not done in hospital, consider outpatient work-up for possible lymphatic leak. Scheduled with Dr. Jacqulynn Cadet at [facility] at [address] on January 14th at 12:45 PM. Arrive at 12:30.  2. Recommend goals of care discussion with PCP as discussions while outpatient.   3. Referral placed to [facility] on discharge, will need to follow up for second opinion as outpatient. 4. [facility] referral was placed again as the old referral had expired.  Records were faxed.  Please call [phone] to schedule this appointment.  Referral was pending on d/c and appointment could not be scheduled for patient. 5. With future paracentesis: please given IV albumin 12.5g per L of ascites removed 6. Patient will need outpatient follow up with nephrology.  Significant Procedures:  12/27 transfused 2 units 12/28-large volume paracentesis(9 liters) 12/29 - transfused 2 units 12/31 - EGD  Significant Labs and Imaging:  Recent Labs  Lab 03/20/18 0419 03/21/18 0533 03/22/18 0448  WBC 4.4 4.5 5.7  HGB 8.2* 8.5* 8.4*  HCT 24.5* 26.2* 25.6*  PLT 221 260 291   Recent Labs  Lab 03/18/18 0512 03/18/18 1000 03/19/18 0443 03/20/18 0419 03/21/18 0533 03/22/18 0448  NA 122*  --  126* 126* 124* 124*  K 4.4  --  4.6 4.8 4.9 5.1  CL 95*  --  97* 98 96* 97*  CO2 20*  --  22 22 21* 22  GLUCOSE 116*  --  122* 123*  117* 133*  BUN 72*  --  70* 68* 69* 68*  CREATININE 2.67*  --  2.43* 2.60* 2.43* 2.37*  CALCIUM 7.9*  --  7.8* 8.0* 7.8* 8.0*  ALBUMIN  --  2.1*  --   --   --   --     Dg Chest Left Decubitus  Result Date: 03/10/2018 CLINICAL DATA:  History of left lower lobe pneumonia, decubitus view EXAM: CHEST - LEFT DECUBITUS COMPARISON:  Films from earlier in the same day. FINDINGS: Left-side-down decubitus view shows large flowing pleural effusion. Postsurgical changes are again noted. IMPRESSION: Large flowing left pleural effusion. Electronically Signed   By: Inez Catalina M.D.   On: 03/10/2018 22:23   Dg Abdomen Acute W/chest  Result Date: 03/10/2018 CLINICAL DATA:  Shortness of breath, pain and swelling in abdomen, swelling in feet and lower legs, increased weakness, dry heaving, anorexia, food stuck in throat, history essential hypertension, no coronary artery disease post bypass, atrial fibrillation EXAM: DG ABDOMEN ACUTE W/ 1V CHEST COMPARISON:  Chest radiograph 01/03/2018, CT abdomen and pelvis 01/05/2018 FINDINGS: Upper normal size of cardiac silhouette post CABG and MVR. Atherosclerotic calcification aorta. LEFT pleural effusion and bibasilar atelectasis. Cannot exclude consolidation in LEFT lower lobe. Upper lungs clear. No pneumothorax. Nonobstructive bowel gas pattern. Increased attenuation of abdomen with medial displacement of bowel consistent with ascites. No bowel wall thickening, evidence of obstruction, or free air. Bones demineralized. IMPRESSION:  Ascites. LEFT pleural effusion and bibasilar atelectasis. Cannot exclude coexistent consolidation in LEFT lower lobe. Electronically Signed   By: Lavonia Dana M.D.   On: 03/10/2018 15:12   Results/Tests Pending at Time of Discharge: none  Discharge Medications:  Allergies as of 03/22/2018      Reactions   Penicillins Other (See Comments)   DID THE REACTION INVOLVE: Swelling of the face/tongue/throat, SOB, or low BP? Unknown Sudden or severe  rash/hives, skin peeling, or the inside of the mouth or nose? Unknown Did it require medical treatment? Yes When did it last happen?When pt was 72 years old Pt knows that his parents had to take him to the ER at the hospital and has not been retested with any PCN or cephalosporin that he is aware of.      Medication List    STOP taking these medications   bismuth subsalicylate 093 GH/82XH suspension Commonly known as:  PEPTO BISMOL   enoxaparin 80 MG/0.8ML injection Commonly known as:  LOVENOX   spironolactone 100 MG tablet Commonly known as:  ALDACTONE   torsemide 20 MG tablet Commonly known as:  DEMADEX     TAKE these medications   calcium-vitamin D 500-200 MG-UNIT tablet Commonly known as:  OSCAL WITH D Take 1 tablet by mouth 3 (three) times daily.   ezetimibe 10 MG tablet Commonly known as:  ZETIA Take 1 tablet (10 mg total) by mouth daily. PLEASE CALL TO SCHEDULE F/U APPT FOR FUTURE REFILLS.Marland Kitchen3RD AND FINAL ATTEMPT!   feeding supplement (PRO-STAT SUGAR FREE 64) Liqd Take 30 mLs by mouth 3 (three) times daily with meals. What changed:  when to take this   Fish Oil 1000 MG Caps Take 2,000 mg by mouth daily.   medium chain triglycerides oil Commonly known as:  MCT OIL Take 15 mLs by mouth 3 (three) times daily with meals. What changed:  when to take this   multivitamin with minerals Tabs tablet Take 1 tablet by mouth daily.   OPCON-A 0.027-0.315 % Soln Generic drug:  Naphazoline-Pheniramine Place 2-3 drops into both eyes daily as needed (for dry eyes).   pantoprazole 40 MG tablet Commonly known as:  PROTONIX Take 1 tablet (40 mg total) by mouth 2 (two) times daily. What changed:  when to take this   rosuvastatin 40 MG tablet Commonly known as:  CRESTOR Take 1 tablet (40 mg total) by mouth daily. PLEASE CALL TO SCHEDULE F/U APPT FOR FUTURE REFILLS.Marland Kitchen3RD AND FINAL ATTEMPT!   VICKS VAPORUB EX Apply 1 application topically See admin instructions. Apply under  nose daily in the evening   Vitamin D-3 25 MCG (1000 UT) Caps Take 1,000 Units by mouth daily.   warfarin 1 MG tablet Commonly known as:  COUMADIN Take as directed. If you are unsure how to take this medication, talk to your nurse or doctor. Original instructions:  TAKE AS DIRECTED BY ANTICOAGULATION CLINIC What changed:  See the new instructions.       Discharge Instructions: Please refer to Patient Instructions section of EMR for full details.  Patient was counseled important signs and symptoms that should prompt return to medical care, changes in medications, dietary instructions, activity restrictions, and follow up appointments.   Follow-Up Appointments: [facility] Follow up.   Why:  They will do your home health care at your home Contact information: [address] [place] [address] [phone]        [facility]. Go on 03/24/2018.   Why:  For INR check @1 :45PM (please arrive 15 min early) Contact information: [place] [address] [phone]       Jacqulynn Cadet, MD. Go on 03/29/2018.   Specialties:  Interventional Radiology, Radiology Why:  Dr. Jacqulynn Cadet at [facility] at [address] on January 14th at 12:45 PM. Arrive at 12:30.  Contact information: [place] [address] [place] [address] [phone]        Wilber Oliphant, MD. Go on 03/23/2018.   Specialty:  Family Medicine Why:  @3 :45 (please arrive 15 min early) Contact information: [address] [place] [address] [phone]           Cleophas Dunker, DO 03/23/2018, 11:23 AM PGY-1, [place]

## 2018-03-12 NOTE — Progress Notes (Signed)
Pt complaining of increased discomfort in his throat and being unable to keep water down. Provider notified. Stated the procedure tomorrow will be the solution. Family and patient updated.

## 2018-03-12 NOTE — Progress Notes (Signed)
ANTICOAGULATION CONSULT NOTE - Initial Consult  Pharmacy Consult for heparin Indication: heparin bridge while INR < 2.5  Allergies  Allergen Reactions  . Penicillins Other (See Comments)    DID THE REACTION INVOLVE: Swelling of the face/tongue/throat, SOB, or low BP? Unknown Sudden or severe rash/hives, skin peeling, or the inside of the mouth or nose? Unknown Did it require medical treatment? Yes When did it last happen?When pt was 72 years old Pt knows that his parents had to take him to the ER at the hospital and has not been retested with any PCN or cephalosporin that he is aware of.    Patient Measurements: Height: 5\' 11"  (180.3 cm) Weight: 184 lb 1.4 oz (83.5 kg) IBW/kg (Calculated) : 75.3 Heparin Dosing Weight: 83.2  Vital Signs: Temp: 98.2 F (36.8 C) (12/28 0230) Temp Source: Oral (12/28 0230) BP: 107/60 (12/28 1131) Pulse Rate: 87 (12/28 0230)  Labs: Recent Labs    03/10/18 1408 03/10/18 1533 03/10/18 2003 03/11/18 0157 03/11/18 0607 03/11/18 1621 03/12/18 0346  HGB 8.7*  --  7.5*  --  6.0* 8.7* 8.2*  HCT 27.2*  --  22.6*  --  18.2* 26.4* 25.2*  PLT 339  --   --   --  217 235 215  LABPROT  --  27.9*  --   --  29.1*  --  26.6*  INR  --  2.65  --   --  2.80  --  2.49  CREATININE 3.42*  --   --   --  3.44*  --  3.59*  TROPONINI <0.03  --  <0.03 <0.03 <0.03  --   --     Estimated Creatinine Clearance: 19.2 mL/min (A) (by C-G formula based on SCr of 3.59 mg/dL (H)).   Medical History: Past Medical History:  Diagnosis Date  . Anasarca 12/01/2017  . Atrial fibrillation ([place]) 07/12/2013   Perioperative, 2001   . CAD (coronary artery disease) of artery bypass graft 07/12/2013   Saphenous vein graft 2001   . Chronic anticoagulation 07/12/2013  . Essential hypertension 07/12/2013  . History of mitral valve replacement with mechanical valve 01/11/2013   Mechanical mitral valve 2001  Bacterial endocarditis as the cause   . HOH (hard of hearing)   . Prostate  cancer ([place]) 07/12/2013    Medications:  Scheduled:  . ezetimibe  10 mg Oral Daily  . feeding supplement  1 Container Oral TID BM  . feeding supplement (PRO-STAT SUGAR FREE 64)  30 mL Oral TID BM  . furosemide  20 mg Intravenous Daily  . levofloxacin  750 mg Oral Q48H  . lidocaine (PF)      . multivitamin with minerals  1 tablet Oral Daily  . pantoprazole (PROTONIX) IV  40 mg Intravenous Q12H  . rosuvastatin  40 mg Oral Daily    Assessment: 16 yoM admitted 12/26 with complains of shortness of breath and dark stools. PTA patient is on warfarin for mechanical MVR and atrial fibrillation. Warfarin has been held since 12/27 (last dose 12/27 at 0135) since patient had a GI bleed requiring transfusion. Today patient's INR has continue to trend down to 2.49, so pharmacy has been consulted to initiate heparin bridge while patient is off warfarin. Of note, patient had paracentesis 12/28 AM with no reported signs of bleeding. Patient's hgb is 8.2, pltc wnl.  PTA warfarin dose: 1mg  daily except for 0.5mg  on M/W/F   Goal of Therapy:  Heparin level 0.3-0.7 units/ml INR 2.5 - 3.5 Monitor platelets  by anticoagulation protocol: Yes   Plan:  Start heparin infusion at 1000 units/hr (NO bolus) Check heparin level in 6 hours Check daily heparin level and CBC Monitor closely for s/sx of bleeding Follow-up plans for transitioning back to warfarin  Thank you for allowing pharmacy to be a part of this patient's care.  Leron Croak, PharmD PGY1 Pharmacy Resident Phone: [phone]  Please check AMION for all [place] phone numbers 03/12/2018,1:50 PM

## 2018-03-12 NOTE — Progress Notes (Signed)
Progress Note  Patient Name: Mario Powell Date of Encounter: 03/12/2018  Primary Cardiologist: Tamala Julian  Subjective   His breathing is labored and he is frustrated that he has not yet had his paracentesis. He has not had any new hematochezia or melena. Hemoglobin is essentially unchanged at 8.2 (6.0 before transfusion, 8.7 immediately afterwards). INR 2.4. Creat 3.44. Na 129. K 5.4  Inpatient Medications    Scheduled Meds: . ezetimibe  10 mg Oral Daily  . feeding supplement  1 Container Oral TID BM  . feeding supplement (PRO-STAT SUGAR FREE 64)  30 mL Oral TID BM  . multivitamin with minerals  1 tablet Oral Daily  . pantoprazole (PROTONIX) IV  40 mg Intravenous Q12H  . rosuvastatin  40 mg Oral Daily   Continuous Infusions: . levofloxacin (LEVAQUIN) IV     PRN Meds: acetaminophen, polyethylene glycol, polyvinyl alcohol   Vital Signs    Vitals:   03/11/18 1337 03/11/18 1552 03/11/18 1915 03/12/18 0230  BP: 121/73 113/69 121/68 106/71  Pulse: 85 88 91 87  Resp: 18 18 18 18   Temp: 97.9 F (36.6 C) (!) 97.5 F (36.4 C) 97.8 F (36.6 C) 98.2 F (36.8 C)  TempSrc: Oral Oral Oral Oral  SpO2: 100% 100% 100% 100%  Weight:    83.5 kg  Height:        Intake/Output Summary (Last 24 hours) at 03/12/2018 8938 Last data filed at 03/12/2018 0232 Gross per 24 hour  Intake 730 ml  Output 1400 ml  Net -670 ml   Filed Weights   03/10/18 2031 03/11/18 0519 03/12/18 0230  Weight: 83.2 kg 83.2 kg 83.5 kg    Telemetry    Sinus rhythm, PVCs; episode of "ventricular tachycardia" is artifactual.- Personally Reviewed  ECG    No new tracing; 5/26 ECG shows sinus rhythm with low voltage due to anasarca- Personally Reviewed  Physical Exam  Appears chronically ill, cachectic (true weight is overestimated by anasarca) GEN: No acute distress.   Neck: 8 cm JVD Cardiac:  Crisp prosthetic valve clicks, RRR, no murmurs, rubs, or gallops.  Respiratory: Clear to auscultation  bilaterally. GI:  Distended with ascites, uncomfortable to palpation MS:  4+ bilateral deep pitting edema of the lower extremities to the level of the pelvis; No deformity. Neuro:  Nonfocal  Psych: Normal affect   Labs    Chemistry Recent Labs  Lab 03/10/18 1408 03/11/18 0607 03/11/18 1621 03/12/18 0346  NA 130* 129*  --  131*  K 5.3* 5.4*  --  4.9  CL 98 96*  --  97*  CO2 21* 24  --  24  GLUCOSE 150* 128*  --  129*  BUN 70* 83*  --  93*  CREATININE 3.42* 3.44*  --  3.59*  CALCIUM 8.8* 8.3*  --  8.4*  PROT 5.4* 4.2* 4.8* 4.7*  ALBUMIN 2.2* 1.8* 2.1* 2.1*  AST 35 22  --  26  ALT 29 21  --  22  ALKPHOS 61 45  --  54  BILITOT 0.9 0.5  --  0.5  GFRNONAA 17* 17*  --  16*  GFRAA 19* 19*  --  18*  ANIONGAP 11 9  --  10     Hematology Recent Labs  Lab 03/11/18 0607 03/11/18 1621 03/12/18 0346  WBC 4.8 5.4 5.5  RBC 1.87* 2.87* 2.72*  HGB 6.0* 8.7* 8.2*  HCT 18.2* 26.4* 25.2*  MCV 97.3 92.0 92.6  MCH 32.1 30.3 30.1  MCHC 33.0  33.0 32.5  RDW 15.6* 16.4* 17.4*  PLT 217 235 215    Cardiac Enzymes Recent Labs  Lab 03/10/18 1408 03/10/18 2003 03/11/18 0157 03/11/18 0607  TROPONINI <0.03 <0.03 <0.03 <0.03   No results for input(s): TROPIPOC in the last 168 hours.   BNP Recent Labs  Lab 03/10/18 1408  BNP 52.8     DDimer No results for input(s): DDIMER in the last 168 hours.   Radiology    Dg Chest Left Decubitus  Result Date: 03/10/2018 CLINICAL DATA:  History of left lower lobe pneumonia, decubitus view EXAM: CHEST - LEFT DECUBITUS COMPARISON:  Films from earlier in the same day. FINDINGS: Left-side-down decubitus view shows large flowing pleural effusion. Postsurgical changes are again noted. IMPRESSION: Large flowing left pleural effusion. Electronically Signed   By: Inez Catalina M.D.   On: 03/10/2018 22:23   Dg Abdomen Acute W/chest  Result Date: 03/10/2018 CLINICAL DATA:  Shortness of breath, pain and swelling in abdomen, swelling in feet and lower  legs, increased weakness, dry heaving, anorexia, food stuck in throat, history essential hypertension, no coronary artery disease post bypass, atrial fibrillation EXAM: DG ABDOMEN ACUTE W/ 1V CHEST COMPARISON:  Chest radiograph 01/03/2018, CT abdomen and pelvis 01/05/2018 FINDINGS: Upper normal size of cardiac silhouette post CABG and MVR. Atherosclerotic calcification aorta. LEFT pleural effusion and bibasilar atelectasis. Cannot exclude consolidation in LEFT lower lobe. Upper lungs clear. No pneumothorax. Nonobstructive bowel gas pattern. Increased attenuation of abdomen with medial displacement of bowel consistent with ascites. No bowel wall thickening, evidence of obstruction, or free air. Bones demineralized. IMPRESSION: Ascites. LEFT pleural effusion and bibasilar atelectasis. Cannot exclude coexistent consolidation in LEFT lower lobe. Electronically Signed   By: Lavonia Dana M.D.   On: 03/10/2018 15:12    Cardiac Studies   Echo 12/02/17 Study Conclusions  - Left ventricle: The cavity size was normal. Wall thickness was normal. Systolic function was normal. The estimated ejection fraction was in the range of 60% to 65%. - Mitral valve: MV posthesis is well seated. Appears to move well. Peak and mean gradients through the valve are 11 and 4 mm Hg respectively There is trival MR. - Right ventricle: Systolic function was mildly reduced. - Pericardium, extracardiac: There was a right pleural effusion. There was a left pleural effusion.  Impressions:  - Very poor acoustic windows limit study.   Patient Profile     72 y.o. male with CAD s/p CABG, mechanical mitral valve, prior history of endocarditis, paroxysmal atrial fibrillation, hypertension, chronic left greater than right lower extremity edema, recurrent ascites, coumadin for chronic anticoagulation, prostate cancer s/p treatment, gastric neuroendocrine tumor, recurrent ascitics and CKD stage IV, admitted with acute GI  bleed and currently off warfarin.  Assessment & Plan    1. Anasarca: Note that previous analysis of ascitic fluid suggested mostly chylous ascites, suspicion for thoracic duct disruption.  Also related to worsening kidney function and hypoalbuminemia.  He does not have nephrotic syndrome based on urine collection September 2019 and does not have a monoclonal spike on blood or urine protein electrophoresis, despite an abnormal urinary free kappa/lambda ratio greater than 30.  Could not exclude a component of constrictive pericarditis.  He needs paracentesis to improve his breathing, but this will have only temporary palliative benefit.  He is very anxious and frustrated today and it is not a good day to discuss long-term plans.   2. CHF: Relatively normal gradients across the mechanical prosthesis.  The left ventricular systolic function.  Unable to directly analyze diastolic dysfunction in the setting of a mechanical mitral prosthesis.  Cannot exclude a component of constriction.  There is no mention of pericardial calcification on his chest CT, although he does have calcified pleural plaques. 3. Mech MVR: With INR falling, need to soon consider resuming anticoagulation.  He is due to have a paracentesis today.  After that we can start intravenous heparin without bolus.  I would avoid using enoxaparin with recent GI bleeding due to the inevitable spikes in anticoagulation level.  In addition, at this point there is a tentative plan for upper endoscopy on Monday and heparin will be logistically easier. 4. Hx parox Afib: No arrhythmia seen on this admission. 5. CAD: Denies angina pectoris. 6. CKD 4: Baseline creatinine seems to be around 2.0-2.3. 7. GI bleed: Tentative schedule for EGD on Monday.     For questions or updates, please contact [place] Please consult [email or web address] for contact info under        Signed, Sanda Klein, MD  03/12/2018, 9:21 AM

## 2018-03-12 NOTE — Progress Notes (Signed)
Patient back from IR for paracentesis.  Patient presents with fatigue.  Refused to take his po  meds at this time.  Nurse will try to give later.  Patient's daughter at bedside. Marcille Blanco, RN

## 2018-03-12 NOTE — Progress Notes (Signed)
Family Medicine Teaching Service Daily Progress Note Intern Pager: [phone]  Patient name: Mario Powell Medical record number: [id number] Date of birth: 1945-09-26 Age: 72 y.o. Gender: male  Primary Care Provider: Wilber Oliphant, MD Consultants: GI Code Status: Full code  Pt Overview and Major Events to Date:  Hospital Day 2 Admitted: 03/10/2018   Assessment and Plan: Mario Powell is a 72 y.o. male presenting with increasing shortness of breath, ascites, darkened stools for the past few days, and found to have a possible LLL pneumonia on chest x-ray. PMH is significant for gastric neuroendocrine polyps, history of mitral valve replacement with mechanical valve on chronic anticoagulation, HTN, prostate cancer s/p treatment, paroxysmal atrial fibrillation, anasarca of unknown source, CKD, normocytic anemia, hyponatremia, and chronic left pleural effusion.   Shortness of breath, likely multifactorial 2/2 ascites, chronic Left pleural effusion, LL Pneumonia, Anemia from GI bleed Pt and family report a few day history of increasing shortness of breath at rest and with activity. . On admission, VSS, afebrile. He has mild increased work of breathing with pauses in between sentences to catch breath.  1 View CXR with left pleural effusion and bibasilar atelectasis, cannot r/o consolidation in LLL.  No leukocytosis. Hgb decreased since last admission and decreasing during this admission.  BNP 52. EKG sinus, i-STAT troponin negative.  Shortness of breath likely multifactorial including ascites, anemia, and pneumonia.    Unlikely cardiac, ACS or CHF, as patient without chest pain, trop negative, and no signs of ischemia on EKG, and BNP <100. Unlikley PE as pt is therapeutic on Warfarin w/ INR 2.6. Echo shows mild LVH, normal systolic, Y0DX, EF 83-38%.  - IR consulted for therapeutic/diagnostic paracentesis - will give lasix 20mg  qdaily - SAAG analysis labs - switch to PO Levaquin (12/26- ), started in  the ED, for pneumonia coverage - Monitor CMP, CBC with differential - Incentive spirometry - Consider thoracentesis for chronic left effusion - Vitals per routine - Oxygen therapy, wean as tolerated  Acute GI bleed.   Hgb 8.7>7.5>6.0.>2 UpRBC>8.7>8.1, from 11.8 on 11/20. 2-day history of frequent "gritty" dark bowel movements, in the setting of recent EGD procedure with [name] neuroendocrine polyp removal on 12/20 and due to chronic anticoagulation.  FOBT positive. BUN 70.. Likely upper GI bleed given recent procedure with biopsies taken and presentation of melena with elevated BUN. Unlikely alternative Upper GI source as EGD showed no gastris or ulcer. Cannot rule out lower GI source.Las colonoscopy was 11/2017,did not show any malignancy, but did show inflammatory polyp.  - transfuse 2 U pRBC - GI consulted , appreciate recommendations  - Protonix 40 mg twice daily - Monitor CBC,  - Encourage p.o. fluid intake - Monitor stool output   Anasarca, associated with ascites: Acute on chronic. Abdomen distended with fluid wave, increasing per family. Last paracentesis was over days ago.  Also with 3+ pitting edema to bilateral lower extremity. Recently started on torsemide 20 mg and spironolactone 100 mg by his PCP for further diuresis.  Recently received paracentesis on 11/22, yielded 5L of chylous ascites. TSH was 11.248, T4 was nml.  -Monitor CBC, CMP - Consult IR as above for paracentesis, therapeutic and diagnostic - Obtain UA, consider 24-hour urine protein pending this - Continued work-up with GI and PCP for exact etiology, may require lymphoscintigraphy or similar MR imaging for evaluation of obstrucion - Holding home diuretic in the setting of AKI  AKI on CKD:  Cr 3.42>3.44>3.59, up from 2.21 on 11/20.  Patient was  recently started on spironolactone and furosemide on 11/14 by his PCP.  While patient is quite edematous, believe this AKI is more likely secondary to diuretic therapy as  his creatinine has acutely worsened with recent switch of medications, comparatively he has been edematous more chronically.  - holding spironolactone as there is not  - Monitor CMP - Avoid nephrotoxic medications as possible - Monitor fluid status closely - Strict I and O  Gastric neuroendocrine tumor: Acute, recurrent.  Previously diagnosed and biopsied with small margins via EGD in September.  However recently had an EGD on 12/20 with surgical pathology showing additional well differentiated neuroendocrine tumor extending to the edge of biopsy.  Follows with [facility] and Dr. Annamaria Boots with hematology/oncology. - Ensure this is addressed during GI follow-up  History of mitral valve replacement with mechanical valve: Chronic, stable. Chronically on anticoagulation with warfarin.  INR therapeutic, 2.65>2.8>2.49.  Goal 2.5-3.5 due to his mechanical valve.  - cardiology consult for management of warfarin/heparin - holding warfarin due to decreased Hgb.  - start heparin when INR lower than 2.5.  - Obtain echocardiogram - Warfarin per pharmacy  Mild protein-calorie malnutrition: Chronic.  Albumin 1.8>2.1.  Family states he has a decreased appetite. - Nutrition consult  Paroxysmal atrial fibrillation: Chronic, stable. Currently in sinus rhythm, HR 80-100.  On chronic warfarin therapy as above, not on any rate controlling regimen. - Monitor on telemetry - Warfarin per pharmacy.  Holding in setting of acute bleed.   History of prostate cancer: Stable.  Patient reports this was 2 decades ago, s/p treatment.  No current concerns. -Monitor outpatient  Chronic hypertension: Stable. SBP 100-130.  On diuretic therapy with spironolactone and torsemide has been controlling his pressure. - Holding home diuretic therapy in the setting of AKI, patient normotensive  Hyponatremia: Chronic.  Na 130>129>131, baseline appears around 131-135.  Difficult to assess whether this is related to fluid  overload versus intravascularly depleted in setting of diuresis. -Monitor CMP - Consider urine sodium, osmol if not improving  Vitamin B12 deficiency: Chronic, stable.  Follows with Dr. Annamaria Boots outpatient for injections monthly, last injection on 11/20.   - Consider vitamin B12 injection vs deferment to outpatient  Normocytic anemia: Chronic.  Previously megaloblastic, receiving B12 injections monthly.  Hemoglobin 8.7, baseline around 9-10. Likely in the setting of blood loss as above.  Asymptomatic. - Monitor CBC - Repeat CBC tonight   FEN/GI: NPO at midnight Prophylaxis: On warfarin therapy-we will monitor bleeding closely  Disposition: home   Medications: Scheduled Meds: . ezetimibe  10 mg Oral Daily  . feeding supplement  1 Container Oral TID BM  . feeding supplement (PRO-STAT SUGAR FREE 64)  30 mL Oral TID BM  . multivitamin with minerals  1 tablet Oral Daily  . pantoprazole (PROTONIX) IV  40 mg Intravenous Q12H  . rosuvastatin  40 mg Oral Daily   Continuous Infusions: . levofloxacin (LEVAQUIN) IV     PRN Meds: acetaminophen, polyethylene glycol, polyvinyl alcohol  ================================================= ================================================= Subjective:  Patient sates he is not having abdominal pain today.  He has had a cough.  He states he feels weak and thought he was going to pass out when he had to urinate in the ED.    Objective: Temp:  [97.3 F (36.3 C)-98.2 F (36.8 C)] 98.2 F (36.8 C) (12/28 0230) Pulse Rate:  [65-97] 87 (12/28 0230) Resp:  [18] 18 (12/28 0230) BP: (101-121)/(62-73) 106/71 (12/28 0230) SpO2:  [97 %-100 %] 100 % (12/28 0230) Weight:  [  83.5 kg] 83.5 kg (12/28 0230) Intake/Output 12/27 0701 - 12/28 0700 In: 730 [I.V.:100; Blood:630] Out: 1400 [Urine:1400] Physical Exam:  Gen: NAD, alert, non-toxic, well-nourished, sitting comfortably.  HEENT: Normocephaic, atraumatic. Clear conjuctiva, no scleral icterus and  injection.  CV: Regular rate and rhythm.  Mechanical valve heard loudest over the mitral area.  Radial pulses 2+ bilaterally. Significant LE edema, 3_ going up to the knees.  Resp: Clear to auscultation bilaterally.  No wheezing, rales, abnormal lung sounds.  Patient became short of breath when adjusting in bed to sit up.  Abd:  Distended. Nontender on palpation to all 4 quadrants.  Positive bowel sounds. Psych: Cooperative with exam. Pleasant. Makes eye contact. Extremities: Full ROM    Laboratory: Recent Labs  Lab 03/11/18 0607 03/11/18 1621 03/12/18 0346  WBC 4.8 5.4 5.5  HGB 6.0* 8.7* 8.2*  HCT 18.2* 26.4* 25.2*  PLT 217 235 215   Recent Labs  Lab 03/10/18 1408 03/11/18 0607 03/11/18 1621 03/12/18 0346  NA 130* 129*  --  131*  K 5.3* 5.4*  --  4.9  CL 98 96*  --  97*  CO2 21* 24  --  24  BUN 70* 83*  --  93*  CREATININE 3.42* 3.44*  --  3.59*  CALCIUM 8.8* 8.3*  --  8.4*  PROT 5.4* 4.2* 4.8* 4.7*  BILITOT 0.9 0.5  --  0.5  ALKPHOS 61 45  --  54  ALT 29 21  --  22  AST 35 22  --  26  GLUCOSE 150* 128*  --  129*    Imaging/Diagnostic Tests: Dg Chest Left Decubitus  Result Date: 03/10/2018 CLINICAL DATA:  History of left lower lobe pneumonia, decubitus view EXAM: CHEST - LEFT DECUBITUS COMPARISON:  Films from earlier in the same day. FINDINGS: Left-side-down decubitus view shows large flowing pleural effusion. Postsurgical changes are again noted. IMPRESSION: Large flowing left pleural effusion. Electronically Signed   By: Inez Catalina M.D.   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 Atherosclerotic calcification aorta. LEFT pleural effusion and bibasilar atelectasis. Cannot exclude consolidation in LEFT lower lobe. Upper lungs clear. No pneumothorax. Nonobstructive bowel gas pattern. Increased attenuation of abdomen with medial displacement of bowel consistent with ascites. No bowel wall thickening, evidence of obstruction, or free air. Bones demineralized. IMPRESSION: Ascites. LEFT pleural effusion and bibasilar atelectasis. Cannot exclude coexistent consolidation in LEFT lower lobe. Electronically Signed   By: Lavonia Dana M.D.   On: 03/10/2018 15:12      Benay Pike, MD 03/12/2018, 6:35 AM PGY-1, [place] Intern pager: [phone], text pages welcome

## 2018-03-13 DIAGNOSIS — D3A8 Other benign neuroendocrine tumors: Secondary | ICD-10-CM | POA: Diagnosis present

## 2018-03-13 DIAGNOSIS — I898 Other specified noninfective disorders of lymphatic vessels and lymph nodes: Secondary | ICD-10-CM

## 2018-03-13 DIAGNOSIS — D3A021 Benign carcinoid tumor of the cecum: Secondary | ICD-10-CM

## 2018-03-13 LAB — BASIC METABOLIC PANEL
Anion gap: 7 (ref 5–15)
BUN: 96 mg/dL — ABNORMAL HIGH (ref 8–23)
CO2: 27 mmol/L (ref 22–32)
Calcium: 7.9 mg/dL — ABNORMAL LOW (ref 8.9–10.3)
Chloride: 96 mmol/L — ABNORMAL LOW (ref 98–111)
Creatinine, Ser: 3.3 mg/dL — ABNORMAL HIGH (ref 0.61–1.24)
GFR calc Af Amer: 20 mL/min — ABNORMAL LOW (ref >60)
GFR calc non Af Amer: 17 mL/min — ABNORMAL LOW (ref >60)
Glucose, Bld: 117 mg/dL — ABNORMAL HIGH (ref 70–99)
Potassium: 4.5 mmol/L (ref 3.5–5.1)
Sodium: 130 mmol/L — ABNORMAL LOW (ref 135–145)

## 2018-03-13 LAB — CBC
HCT: 21.2 % — ABNORMAL LOW (ref 39.0–52.0)
HCT: 28.6 % — ABNORMAL LOW (ref 39.0–52.0)
Hemoglobin: 6.9 g/dL — CL (ref 13.0–17.0)
Hemoglobin: 9.4 g/dL — ABNORMAL LOW (ref 13.0–17.0)
MCH: 30.9 pg (ref 26.0–34.0)
MCH: 30.7 pg (ref 26.0–34.0)
MCHC: 32.5 g/dL (ref 30.0–36.0)
MCHC: 32.9 g/dL (ref 30.0–36.0)
MCV: 95.1 fL (ref 80.0–100.0)
MCV: 93.5 fL (ref 80.0–100.0)
Platelets: 200 10*3/uL (ref 150–400)
Platelets: 207 10*3/uL (ref 150–400)
RBC: 2.23 MIL/uL — ABNORMAL LOW (ref 4.22–5.81)
RBC: 3.06 MIL/uL — ABNORMAL LOW (ref 4.22–5.81)
RDW: 17.3 % — ABNORMAL HIGH (ref 11.5–15.5)
RDW: 17.7 % — ABNORMAL HIGH (ref 11.5–15.5)
WBC: 4.5 10*3/uL (ref 4.0–10.5)
WBC: 4.8 10*3/uL (ref 4.0–10.5)
nRBC: 0 % (ref 0.0–0.2)
nRBC: 0 % (ref 0.0–0.2)

## 2018-03-13 LAB — PROTIME-INR
INR: 3.31
Prothrombin Time: 33.1 seconds — ABNORMAL HIGH (ref 11.4–15.2)

## 2018-03-13 LAB — PREPARE RBC (CROSSMATCH)

## 2018-03-13 LAB — HEPARIN LEVEL (UNFRACTIONATED): HEPARIN UNFRACTIONATED: 0.52 [IU]/mL (ref 0.30–0.70)

## 2018-03-13 LAB — TRIGLYCERIDES, BODY FLUIDS: Triglycerides, Fluid: 1113 mg/dL

## 2018-03-13 MED ORDER — FUROSEMIDE 10 MG/ML IJ SOLN
20.0000 mg | Freq: Once | INTRAMUSCULAR | Status: AC
  Administered 2018-03-13: 20 mg via INTRAVENOUS
  Filled 2018-03-13: qty 2

## 2018-03-13 MED ORDER — SODIUM CHLORIDE 0.9% IV SOLUTION: Freq: Once | INTRAVENOUS | Status: DC

## 2018-03-13 NOTE — Progress Notes (Signed)
Family Medicine Teaching Service Daily Progress Note Intern Pager: [phone]  Patient name: Mario Powell Medical record number: [id number] Date of birth: Feb 10, 1946 Age: 72 y.o. Gender: male  Primary Care Provider: Wilber Oliphant, MD Consultants: GI Code Status: Full code  Pt Overview and Major Events to Date:  Hospital Day 3 Admitted: 03/10/2018   Assessment and Plan: Mario Powell is a 72 y.o. male presenting with increasing shortness of breath, ascites, darkened stools for the past few days, and found to have a possible LLL pneumonia on chest x-ray. PMH is significant for gastric neuroendocrine polyps, history of mitral valve replacement with mechanical valve on chronic anticoagulation, HTN, prostate cancer s/p treatment, paroxysmal atrial fibrillation, anasarca of unknown source, CKD, normocytic anemia, hyponatremia, and chronic left pleural effusion.   Shortness of breath, likely multifactorial 2/2 ascites, chronic Left pleural effusion, LL Pneumonia, Anemia from GI bleed Patient had 9 L of fluids pulled off yesterday. Shortness of breath likely multifactorial including ascites, anemia, and pneumonia.    Unlikely cardiac, ACS or CHF, as patient without chest pain, trop negative, and no signs of ischemia on EKG, and BNP <100.  - restarted spironolactone 25mg  - will give lasix 20mg  qdaily - f/u SAAG analysis labs - switch to PO Levaquin (12/26- ), started in the ED, for pneumonia coverage - Monitor CMP, CBC with differential - Incentive spirometry - Consider thoracentesis for chronic left effusion - Vitals per routine - Oxygen therapy, wean as tolerated  Acute GI bleed.   Hgb 8.7>7.5>6.0.>2 UpRBC>8.7>8.1, 7.7>,  from 11.8 on 11/20. 2-day history of frequent "gritty" dark bowel movements, in the setting of recent EGD procedure with gastric neuroendocrine polyp removal on 12/20 and due to chronic anticoagulation.  FOBT positive. . Likely upper GI bleed given recent procedure with  biopsies taken and presentation of melena with elevated BUN. Unlikely alternative Upper GI source as EGD showed no gastris or ulcer. Cannot rule out lower GI source.Last colonoscopy was 11/2017,did not show any malignancy, but did show inflammatory polyp. patient was switched to heparin and INR increased to 3.31 today. Will hold heparin for now and transfuse patient 2 U pRBC. Awaiting EGD.  - transfuse 2 U pRBC - hold heparin, continue to get INR - GI consulted , appreciate recommendations  - cards consulted, appreciate recs - Protonix 40 mg twice daily - Monitor CBC,  - Encourage p.o. fluid intake - Monitor stool output   Anasarca, associated with ascites: Acute on chronic. Improved with 9L removed with paracentesis yesterday. .  Also with 3+ pitting edema to bilateral lower extremity. Recently started on torsemide 20 mg and spironolactone 100 mg by his PCP for further diuresis.  Recently received paracentesis on 11/22, yielded 5L of chylous ascites. TSH was 11.248, T4 was nml.  - lasix 20mg  IV daily - spironolactone 25mg  daily -Monitor CBC, CMP - Continued work-up with GI and PCP for exact etiology, may require lymphoscintigraphy or similar MR imaging for evaluation of obstrucion - Holding home diuretic in the setting of AKI  AKI on CKD:  Cr 3.42>3.44>3.59>3.3, up from 2.21 on 11/20.  Patient was recently started on spironolactone and furosemide on 11/14 by his PCP.  - restarted spironolactone - lasix daily - Monitor CMP - Avoid nephrotoxic medications as possible - Monitor fluid status closely - Strict I and O  Gastric neuroendocrine tumor: Acute, recurrent.  Previously diagnosed and biopsied with small margins via EGD in September.  However recently had an EGD on 12/20 with surgical pathology showing  additional well differentiated neuroendocrine tumor extending to the edge of biopsy.  Follows with [facility] and Dr. Annamaria Boots with hematology/oncology. - Ensure this is addressed during GI  follow-up  History of mitral valve replacement with mechanical valve: Chronic, stable. Chronically on anticoagulation with warfarin.  INR therapeutic, 2.65>2.8>2.49>3.31.  Goal 2.5-3.5 due to his mechanical valve. Started heparin when INR fell to 2.49. INR has since increased with drop in hgb.   - cardiology consult for management of warfarin/heparin - hold heparin.   Severe protein-calorie malnutrition: Chronic.  Albumin 1.8>2.1.  Family states he has a decreased appetite. - Nutrition consult  Paroxysmal atrial fibrillation: Chronic, stable. Currently in sinus rhythm, HR 80-100.  On chronic warfarin therapy as above, not on any rate controlling regimen. Holding warfarin and heparin at the moment - Monitor on telemetry  History of prostate cancer: Stable.  Patient reports this was 2 decades ago, s/p treatment.  No current concerns. -Monitor outpatient  Chronic hypertension: Stable. 97/67 this am.  Had 8am bp of 92/50 with map 60.   On diuretic therapy with spironolactone and torsemide has been controlling his pressure. - Holding home diuretic therapy in the setting of AKI, patient normotensive  Hyponatremia: Chronic.  Na 130>129>131>130, baseline appears around 131-135.  Difficult to assess whether this is related to fluid overload versus intravascularly depleted in setting of diuresis. -Monitor CMP - Consider urine sodium, osmol if not improving  Vitamin B12 deficiency: Chronic, stable.  Follows with Dr. Annamaria Boots outpatient for injections monthly, last injection on 11/20.   - Consider vitamin B12 injection vs deferment to outpatient  Normocytic anemia: Chronic.  Previously megaloblastic, receiving B12 injections monthly.  Hemoglobin 8.7, baseline around 9-10. Likely in the setting of blood loss as above.  Asymptomatic. - Monitor CBC   FEN/GI: NPO at midnight Prophylaxis: On warfarin therapy-we will monitor bleeding closely  Disposition: home   Medications: Scheduled  Meds: . ezetimibe  10 mg Oral Daily  . feeding supplement  1 Container Oral TID BM  . feeding supplement (PRO-STAT SUGAR FREE 64)  30 mL Oral TID BM  . furosemide  20 mg Intravenous Daily  . levofloxacin  750 mg Oral Q48H  . multivitamin with minerals  1 tablet Oral Daily  . pantoprazole (PROTONIX) IV  40 mg Intravenous Q12H  . rosuvastatin  40 mg Oral Daily  . spironolactone  25 mg Oral Daily   Continuous Infusions: . heparin 1,000 Units/hr (03/12/18 1502)   PRN Meds: acetaminophen, polyethylene glycol, polyvinyl alcohol  ================================================= ================================================= Subjective:  Patient is feeling much better today.  He is able to breathe better and swallow without difficulty.  He has been drinking water and had some broth.    Objective: Temp:  [97 F (36.1 C)-97.5 F (36.4 C)] 97 F (36.1 C) (12/29 0605) Pulse Rate:  [87-91] 87 (12/29 0605) Resp:  [15-18] 18 (12/29 0605) BP: (96-108)/(55-72) 102/63 (12/29 0605) SpO2:  [99 %-100 %] 100 % (12/29 0605) Weight:  [77.8 kg] 77.8 kg (12/29 0610) Intake/Output 12/28 0701 - 12/29 0700 In: 2243.7 [P.O.:2125; I.V.:118.7] Out: 550 [Urine:550] Physical Exam:  Gen: NAD, alert, non-toxic, well-nourished, sitting comfortably.  HEENT: Normocephaic, atraumatic. Clear conjuctiva, no scleral icterus and injection.  CV: Regular rate and rhythm.  Mechanical valve heard loudest over the mitral area.  Radial pulses 2+ bilaterally. Significant LE edema, 3+ going up to the knees, although edema appears slightly improved from yesterday.  Resp: Clear to auscultation bilaterally.  No wheezing, rales. Patient did have some dimished  breath sounds over the left lower lobe.   Abd: soft, no distention, vastly improved from yesterday's exam.  Some mild LUQ tenderness to palpation.  Psych: Cooperative with exam. Pleasant. Makes eye contact. Extremities: Full ROM    Laboratory: Recent Labs  Lab  03/11/18 1621 03/12/18 0346 03/12/18 1433  WBC 5.4 5.5 3.9*  HGB 8.7* 8.2* 7.7*  HCT 26.4* 25.2* 23.5*  PLT 235 215 181   Recent Labs  Lab 03/10/18 1408 03/11/18 0607 03/11/18 1621 03/12/18 0346  NA 130* 129*  --  131*  K 5.3* 5.4*  --  4.9  CL 98 96*  --  97*  CO2 21* 24  --  24  BUN 70* 83*  --  93*  CREATININE 3.42* 3.44*  --  3.59*  CALCIUM 8.8* 8.3*  --  8.4*  PROT 5.4* 4.2* 4.8* 4.7*  BILITOT 0.9 0.5  --  0.5  ALKPHOS 61 45  --  54  ALT 29 21  --  22  AST 35 22  --  26  GLUCOSE 150* 128*  --  129*    Imaging/Diagnostic Tests: US Paracentesis  Result Date: 03/12/2018 INDICATION: Abdominal distention and discomfort. Ascites. Request for diagnostic and therapeutic paracentesis. EXAM: ULTRASOUND GUIDED RIGHT LOWER QUADRANT PARACENTESIS MEDICATIONS: None. COMPLICATIONS: None immediate. PROCEDURE: Informed written consent was obtained from the patient after a discussion of the risks, benefits and alternatives to treatment. A timeout was performed prior to the initiation of the procedure. Initial ultrasound scanning demonstrates a large amount of ascites within the right lower abdominal quadrant. The right lower abdomen was prepped and draped in the usual sterile fashion. 1% lidocaine was used for local anesthesia. Following this, a 19 gauge, 7-cm, Yueh catheter was introduced. An ultrasound image was saved for documentation purposes. The paracentesis was performed. The catheter was removed and a dressing was applied. The patient tolerated the procedure well without immediate post procedural complication. FINDINGS: A total of approximately 9 L of chylous yellow fluid was removed. Samples were sent to the laboratory as requested by the clinical team. IMPRESSION: Successful ultrasound-guided paracentesis yielding 9 liters of peritoneal fluid. Read by: Ascencion Dike PA-C Electronically Signed   By: Markus Daft M.D.   On: 03/12/2018 12:00      Benay Pike, MD 03/13/2018, 6:48  AM PGY-1, [place] Intern pager: [phone], text pages welcome

## 2018-03-13 NOTE — Progress Notes (Signed)
Progress Note  Patient Name: Mario Powell Date of Encounter: 03/13/2018  Primary Cardiologist: Tamala Julian  Subjective   Looks a lot more comfortable after 9 L paracentesis with chylous ascitic fluid.  Denies dyspnea.  No new episodes of melena, but hemoglobin has decreased to 6.9.  INR 3.3. Renal function parameters unchanged, creatinine 3.3. BUN 96.  Inpatient Medications    Scheduled Meds: . sodium chloride   Intravenous Once  . ezetimibe  10 mg Oral Daily  . feeding supplement  1 Container Oral TID BM  . feeding supplement (PRO-STAT SUGAR FREE 64)  30 mL Oral TID BM  . furosemide  20 mg Intravenous Daily  . furosemide  20 mg Intravenous Once  . levofloxacin  750 mg Oral Q48H  . multivitamin with minerals  1 tablet Oral Daily  . pantoprazole (PROTONIX) IV  40 mg Intravenous Q12H  . rosuvastatin  40 mg Oral Daily  . spironolactone  25 mg Oral Daily   Continuous Infusions: . heparin 1,000 Units/hr (03/12/18 1502)   PRN Meds: acetaminophen, polyethylene glycol, polyvinyl alcohol   Vital Signs    Vitals:   03/12/18 1619 03/12/18 1909 03/13/18 0605 03/13/18 0610  BP: 108/63 (!) 102/59 102/63   Pulse: 90 91 87   Resp: 15 18 18    Temp: (!) 97.5 F (36.4 C) (!) 97.4 F (36.3 C) (!) 97 F (36.1 C)   TempSrc: Oral Oral    SpO2: 99% 100% 100%   Weight:    77.8 kg  Height:        Intake/Output Summary (Last 24 hours) at 03/13/2018 9476 Last data filed at 03/13/2018 0700 Gross per 24 hour  Intake 2283.73 ml  Output 550 ml  Net 1733.73 ml   Filed Weights   03/11/18 0519 03/12/18 0230 03/13/18 0610  Weight: 83.2 kg 83.5 kg 77.8 kg    Telemetry    Sinus rhythm, occasional PVCs, no true VT - Personally Reviewed  ECG     - Personally Reviewed  Physical Exam  Appears frail, chronically ill, cachectic GEN: No acute distress.   Neck:  6 cm JVD Cardiac: RRR, first prosthetic valve clicks, no murmurs, rubs, or gallops.  Respiratory: Clear to auscultation  bilaterally. GI: Soft, nontender, non-distended .  Small ecchymosis at paracentesis site, no overt bleeding MS:  Markedly improved edema, no more than 2+; No deformity. Neuro:  Nonfocal  Psych: Normal affect   Labs    Chemistry Recent Labs  Lab 03/10/18 1408 03/11/18 0607 03/11/18 1621 03/12/18 0346 03/13/18 0523  NA 130* 129*  --  131* 130*  K 5.3* 5.4*  --  4.9 4.5  CL 98 96*  --  97* 96*  CO2 21* 24  --  24 27  GLUCOSE 150* 128*  --  129* 117*  BUN 70* 83*  --  93* 96*  CREATININE 3.42* 3.44*  --  3.59* 3.30*  CALCIUM 8.8* 8.3*  --  8.4* 7.9*  PROT 5.4* 4.2* 4.8* 4.7*  --   ALBUMIN 2.2* 1.8* 2.1* 2.1*  --   AST 35 22  --  26  --   ALT 29 21  --  22  --   ALKPHOS 61 45  --  54  --   BILITOT 0.9 0.5  --  0.5  --   GFRNONAA 17* 17*  --  16* 17*  GFRAA 19* 19*  --  18* 20*  ANIONGAP 11 9  --  10 7  Hematology Recent Labs  Lab 03/12/18 0346 03/12/18 1433 03/13/18 0523  WBC 5.5 3.9* 4.5  RBC 2.72* 2.51* 2.23*  HGB 8.2* 7.7* 6.9*  HCT 25.2* 23.5* 21.2*  MCV 92.6 93.6 95.1  MCH 30.1 30.7 30.9  MCHC 32.5 32.8 32.5  RDW 17.4* 17.5* 17.3*  PLT 215 181 200    Cardiac Enzymes Recent Labs  Lab 03/10/18 1408 03/10/18 2003 03/11/18 0157 03/11/18 0607  TROPONINI <0.03 <0.03 <0.03 <0.03   No results for input(s): TROPIPOC in the last 168 hours.   BNP Recent Labs  Lab 03/10/18 1408  BNP 52.8     DDimer No results for input(s): DDIMER in the last 168 hours.   Radiology    US Paracentesis  Result Date: 03/12/2018 INDICATION: Abdominal distention and discomfort. Ascites. Request for diagnostic and therapeutic paracentesis. EXAM: ULTRASOUND GUIDED RIGHT LOWER QUADRANT PARACENTESIS MEDICATIONS: None. COMPLICATIONS: None immediate. PROCEDURE: Informed written consent was obtained from the patient after a discussion of the risks, benefits and alternatives to treatment. A timeout was performed prior to the initiation of the procedure. Initial ultrasound scanning  demonstrates a large amount of ascites within the right lower abdominal quadrant. The right lower abdomen was prepped and draped in the usual sterile fashion. 1% lidocaine was used for local anesthesia. Following this, a 19 gauge, 7-cm, Yueh catheter was introduced. An ultrasound image was saved for documentation purposes. The paracentesis was performed. The catheter was removed and a dressing was applied. The patient tolerated the procedure well without immediate post procedural complication. FINDINGS: A total of approximately 9 L of chylous yellow fluid was removed. Samples were sent to the laboratory as requested by the clinical team. IMPRESSION: Successful ultrasound-guided paracentesis yielding 9 liters of peritoneal fluid. Read by: Ascencion Dike PA-C Electronically Signed   By: Markus Daft M.D.   On: 03/12/2018 12:00    Cardiac Studies   Echo 12/02/17 Study Conclusions  - Left ventricle: The cavity size was normal. Wall thickness was normal. Systolic function was normal. The estimated ejection fraction was in the range of 60% to 65%. - Mitral valve: MV posthesis is well seated. Appears to move well. Peak and mean gradients through the valve are 11 and 4 mm Hg respectively There is trival MR. - Right ventricle: Systolic function was mildly reduced. - Pericardium, extracardiac: There was a right pleural effusion. There was a left pleural effusion.  Impressions:  - Very poor acoustic windows limit study.  Patient Profile     72 y.o. male with CAD s/p CABG, mechanical mitral valve,prior history of endocarditis, paroxysmal atrial fibrillation, hypertension, chronic left greater than right lower extremity edema, recurrent chylous ascites, coumadin forchronic anticoagulation, prostatecancer s/p treatment,gastric neuroendocrine tumor, recurrent ascitics and CKD stage IV, admitted with acute GI bleed and currently off warfarin.  Assessment & Plan    1. Anasarca/ascites:   Triglyceride level is even higher than before in the ascitic fluid, strong suspicion for thoracic duct disruption,, could also explain lower extremity lymphedema.  Swelling is also related to worsening kidney function and hypoalbuminemia.  He does not have nephrotic syndrome based on urine collection September 2019 and does not have a monoclonal spike on blood or urine protein electrophoresis (despite an abnormal urinary free kappa/lambda ratio greater than 30).  Symptoms improved following palliative paracentesis, but this is likely to be a short-term solution.  Consider interventional radiology consultation with Dr. Laurence Ferrari. Octreotide and orlistat may help. 2. CHF: Relatively normal gradients across the mechanical prosthesis.  The left ventricular  systolic function.  Unable to directly analyze diastolic dysfunction in the setting of a mechanical mitral prosthesis.  Cannot exclude a component of constriction.  There is no mention of pericardial calcification on his chest CT, although he does have calcified pleural plaques. 3. Mech MVR:  INR has increased and hemoglobin is falling.  We will stop intravenous heparin.  He may require another transfusion. 4. Hx parox Afib: No arrhythmia seen on this admission. 5. CAD: Denies angina pectoris. 6. Acute on CKD 4: Baseline creatinine seems to be around 2.0-2.3. 7. GI bleed: Tentative schedule for EGD next week.     For questions or updates, please contact [place] Please consult [email or web address] for contact info under        Signed, Sanda Klein, MD  03/13/2018, 8:08 AM

## 2018-03-13 NOTE — Evaluation (Signed)
Physical Therapy Evaluation Patient Details Name: Mario Powell MRN: [id number] DOB: Aug 06, 1945 Today's Date: 03/13/2018   History of Present Illness  Pt is a 72 y.o. M presenting with increasing SOB, ascites, darkened stools.  Imaging in ED reveals ascites with L pleural effusion and bibasilar atelectasis. Paracentesis 12/28 removed 9L of fluid. PMH is significant for a.fib on Coumadin, CAD, HTN, MV replacement, anemia, prostate cancer.  Clinical Impression  Orders received for PT evaluation. Patient demonstrates deficits in functional mobility as indicated below. Will benefit from continued skilled PT to address deficits and maximize function. Will see as indicated and progress as tolerated.  Prior to admission patient was very independent, working full time, driving, and taking care of all of his needs. Currently, patient very deconditioned and weak requiring increased physical assist for basic task performance. Patient is very motivated to return to baseline level of functioning. Feel patient would be good candidate for intensive rehabilitation program. Recommending CIR consult at this time.    Follow Up Recommendations CIR    Equipment Recommendations  (TBD)    Recommendations for Other Services Rehab consult     Precautions / Restrictions Precautions Precautions: Fall Restrictions Weight Bearing Restrictions: No      Mobility  Bed Mobility Overal bed mobility: Needs Assistance Bed Mobility: Supine to Sit     Supine to sit: Supervision     General bed mobility comments: supervision for safety, no physical assist  Transfers Overall transfer level: Needs assistance Equipment used: 1 person hand held assist Transfers: Sit to/from Stand;Stand Pivot Transfers Sit to Stand: Min assist;+2 safety/equipment Stand pivot transfers: Min assist;+2 safety/equipment       General transfer comment: min assist +2 for safety, increased time and effort; cueing for hand placement    Ambulation/Gait             General Gait Details: deferred due to dizziness  Stairs            Wheelchair Mobility    Modified Rankin (Stroke Patients Only)       Balance Overall balance assessment: Needs assistance Sitting-balance support: No upper extremity supported;Feet supported Sitting balance-Leahy Scale: Fair     Standing balance support: No upper extremity supported;During functional activity Standing balance-Leahy Scale: Poor Standing balance comment: able to engage in ADL 0 hand support, but preference to UE support                             Pertinent Vitals/Pain Pain Assessment: No/denies pain    Home Living Family/patient expects to be discharged to:: Private residence Living Arrangements: Children Available Help at Discharge: Family Type of Home: House Home Access: Stairs to enter Entrance Stairs-Rails: Can reach both Entrance Stairs-Number of Steps: 7 Home Layout: Two level Home Equipment: Cane - single point      Prior Function Level of Independence: Independent         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, technical sales, driving     Journalist, newspaper   Dominant Hand: Right    Extremity/Trunk Assessment   Upper Extremity Assessment Upper Extremity Assessment: Generalized weakness    Lower Extremity Assessment Lower Extremity Assessment: Defer to PT evaluation;Generalized weakness(Bilateral LE edema (R>L))       Communication   Communication: HOH  Cognition Arousal/Alertness: Awake/alert Behavior During Therapy: WFL for tasks assessed/performed Overall Cognitive Status: Within Functional Limits for tasks assessed  General Comments General comments (skin integrity, edema, etc.): daughter present and supportive; pt on RA throughout session; B LE edema noted (pt voices at baseline)     Exercises     Assessment/Plan    PT Assessment Patient needs continued PT  services  PT Problem List Decreased strength;Decreased activity tolerance;Decreased balance;Decreased mobility;Decreased coordination;Cardiopulmonary status limiting activity;Pain       PT Treatment Interventions DME instruction;Gait training;Stair training;Functional mobility training;Therapeutic exercise;Therapeutic activities;Balance training;Patient/family education    PT Goals (Current goals can be found in the Care Plan section)  Acute Rehab PT Goals Patient Stated Goal: to get stronger and back to my normal PT Goal Formulation: With patient/family Time For Goal Achievement: 03/27/18 Potential to Achieve Goals: Good    Frequency Min 3X/week   Barriers to discharge        Co-evaluation PT/OT/SLP Co-Evaluation/Treatment: Yes Reason for Co-Treatment: To address functional/ADL transfers;Other (comment)(activity tolerance) PT goals addressed during session: Mobility/safety with mobility;Balance OT goals addressed during session: ADL's and self-care;Other (comment)(mobility)       AM-PAC PT "6 Clicks" Mobility  Outcome Measure Help needed turning from your back to your side while in a flat bed without using bedrails?: A Little Help needed moving from lying on your back to sitting on the side of a flat bed without using bedrails?: A Little Help needed moving to and from a bed to a chair (including a wheelchair)?: A Little Help needed standing up from a chair using your arms (e.g., wheelchair or bedside chair)?: A Little Help needed to walk in hospital room?: A Lot Help needed climbing 3-5 steps with a railing? : A Lot 6 Click Score: 16    End of Session Equipment Utilized During Treatment: Gait belt Activity Tolerance: Patient tolerated treatment well Patient left: in chair;with call bell/phone within reach;with chair alarm set;with family/visitor present Nurse Communication: Mobility status PT Visit Diagnosis: Unsteadiness on feet (R26.81);Muscle weakness (generalized)  (M62.81);Difficulty in walking, not elsewhere classified (R26.2)    Time: 6389-3734 PT Time Calculation (min) (ACUTE ONLY): 25 min   Charges:   PT Evaluation $PT Eval Moderate Complexity: 1 Mod          Alben Deeds, PT DPT  Board Certified Neurologic Specialist Acute Rehabilitation Services Pager [phone] Office [place] 03/13/2018, 11:13 AM

## 2018-03-13 NOTE — Evaluation (Addendum)
Occupational Therapy Evaluation Patient Details Name: Mario Powell MRN: [id number] DOB: 05/09/1945 Today's Date: 03/13/2018    History of Present Illness Pt is a 72 y.o. M presenting with increasing SOB, ascites, darkened stools.  Imaging in ED reveals ascites with L pleural effusion and bibasilar atelectasis. Paracentesis 12/28 removed 9L of fluid. PMH is significant for a.fib on Coumadin, CAD, HTN, MV replacement, anemia, prostate cancer.   Clinical Impression   PTA patient independent, working and driving.  Patient admitted for above and limited by problem list below, including: decreased activity tolerance, impaired balance, generalized weakness.  Patient completes UB ADLs with min assist, LB ADLs with min assist, toileting with min assist and transfers with min assist +2 for safety. Pt reports dizziness with standing activities, but fades rapidly with sitting rest breaks. Patient will benefit from continued OT services while admitted and after discharge at intensive CIR level in order to optimize return to independence with ADLs and mobility. Patient highly motivated and independent prior to admission and I believe intensive rehab will allow patient to return to independent level.     Follow Up Recommendations  CIR    Equipment Recommendations  Other (comment)(TBD at next venue of care)    Recommendations for Other Services Rehab consult     Precautions / Restrictions Precautions Precautions: Fall Restrictions Weight Bearing Restrictions: No      Mobility Bed Mobility Overal bed mobility: Needs Assistance Bed Mobility: Supine to Sit     Supine to sit: Supervision     General bed mobility comments: supervision for safety, no physical assist  Transfers Overall transfer level: Needs assistance Equipment used: 1 person hand held assist Transfers: Sit to/from Stand;Stand Pivot Transfers Sit to Stand: Min assist;+2 safety/equipment Stand pivot transfers: Min assist;+2  safety/equipment       General transfer comment: min assist +2 for safety, increased time and effort; cueing for hand placement     Balance Overall balance assessment: Needs assistance Sitting-balance support: No upper extremity supported;Feet supported Sitting balance-Leahy Scale: Fair     Standing balance support: No upper extremity supported;During functional activity Standing balance-Leahy Scale: Poor Standing balance comment: able to engage in ADL 0 hand support, but preference to UE support                           ADL either performed or assessed with clinical judgement   ADL Overall ADL's : Needs assistance/impaired     Grooming: Set up;Sitting   Upper Body Bathing: Set up;Sitting   Lower Body Bathing: Minimal assistance;Sit to/from stand   Upper Body Dressing : Minimal assistance;Sitting   Lower Body Dressing: Minimal assistance;Sit to/from stand   Toilet Transfer: Minimal assistance;+2 for safety/equipment;Stand-pivot Toilet Transfer Details (indicate cue type and reason): simulated to recliner  Toileting- Clothing Manipulation and Hygiene: Minimal assistance;Sit to/from stand;+2 for safety/equipment       Functional mobility during ADLs: Minimal assistance;+2 for safety/equipment General ADL Comments: pt limited by generalized weakness, impaired balance and activity tolerance     Vision   Vision Assessment?: No apparent visual deficits     Perception     Praxis      Pertinent Vitals/Pain Pain Assessment: No/denies pain     Hand Dominance Right   Extremity/Trunk Assessment Upper Extremity Assessment Upper Extremity Assessment: Generalized weakness   Lower Extremity Assessment Lower Extremity Assessment: Defer to PT evaluation       Communication Communication Communication: [facility]   Cognition Arousal/Alertness:  Awake/alert Behavior During Therapy: WFL for tasks assessed/performed Overall Cognitive Status: Within Functional  Limits for tasks assessed                                     General Comments  daughter present and supportive; pt on RA throughout session; B LE edema noted (pt voices at baseline)     Exercises     Shoulder Instructions      Home Living Family/patient expects to be discharged to:: Private residence Living Arrangements: Children Available Help at Discharge: Family Type of Home: House Home Access: Stairs to enter Technical brewer of Steps: 7   Home Layout: Two level Alternate Level Stairs-Number of Steps: 12 Alternate Level Stairs-Rails: Right;Left           Home Equipment: Cane - single point          Prior Functioning/Environment Level of Independence: Independent        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, technical sales, driving        OT Problem List: Decreased strength;Decreased activity tolerance;Impaired balance (sitting and/or standing);Decreased knowledge of use of DME or AE;Cardiopulmonary status limiting activity;Increased edema      OT Treatment/Interventions: Self-care/ADL training;DME and/or AE instruction;Therapeutic exercise;Therapeutic activities;Patient/family education;Balance training    OT Goals(Current goals can be found in the care plan section) Acute Rehab OT Goals Patient Stated Goal: to get stronger and back to my normal OT Goal Formulation: With patient Time For Goal Achievement: 03/27/18 Potential to Achieve Goals: Good  OT Frequency: Min 2X/week   Barriers to D/C:            Co-evaluation PT/OT/SLP Co-Evaluation/Treatment: Yes Reason for Co-Treatment: To address functional/ADL transfers;Other (comment)(activity tolerance)   OT goals addressed during session: ADL's and self-care;Other (comment)(mobility)      AM-PAC OT "6 Clicks" Daily Activity     Outcome Measure Help from another person eating meals?: None Help from another person taking care of personal grooming?: None(seated) Help from another person  toileting, which includes using toliet, bedpan, or urinal?: A Little Help from another person bathing (including washing, rinsing, drying)?: A Little Help from another person to put on and taking off regular upper body clothing?: A Little Help from another person to put on and taking off regular lower body clothing?: A Little 6 Click Score: 20   End of Session Equipment Utilized During Treatment: Gait belt Nurse Communication: Mobility status  Activity Tolerance: Patient tolerated treatment well Patient left: in chair;with call bell/phone within reach;with family/visitor present  OT Visit Diagnosis: Unsteadiness on feet (R26.81);Muscle weakness (generalized) (M62.81)                Time: 3716-9678 OT Time Calculation (min): 29 min Charges:  OT General Charges $OT Visit: 1 Visit OT Evaluation $OT Eval Moderate Complexity: [place], OT Acute Rehabilitation Services Pager [phone] Office [phone]   Delight Stare 03/13/2018, 10:28 AM

## 2018-03-13 NOTE — Progress Notes (Signed)
INR up to 3.3.  Respiratory status much improved.  Will revisit Monday, suspect a couple more days before can do endoscopy.

## 2018-03-13 NOTE — Progress Notes (Signed)
CRITICAL VALUE ALERT  hgb  6.9  Date & Time Notied: 03/13/2018 7:18  Provider NotifiedOlsen, family med teaching service Orders Received/Actions taken:in process

## 2018-03-13 NOTE — Progress Notes (Signed)
Rehab Admissions Coordinator Note:  Patient was screened by Cleatrice Burke for appropriateness for an Inpatient Acute Rehab Consult per PT and OT recommendations.  At this time, we are recommending Inpatient Rehab consult. Please place order for conusl tif pt would like to be considered for admit.  Cleatrice Burke, RN MSN 03/13/2018, 11:24 AM  I can be reached at [phone].

## 2018-03-13 NOTE — Progress Notes (Signed)
Notified by lab that the pleural fluid forwarded to lab on 12/27 would not be able to be sent out as it wasn't light protected prior to arrival in lab or while in lab. Lab staff state they were unaware it had to be light protected.  MD Irish Elders notified of this.

## 2018-03-14 DIAGNOSIS — N184 Chronic kidney disease, stage 4 (severe): Secondary | ICD-10-CM | POA: Diagnosis present

## 2018-03-14 DIAGNOSIS — I25810 Atherosclerosis of coronary artery bypass graft(s) without angina pectoris: Secondary | ICD-10-CM

## 2018-03-14 DIAGNOSIS — I10 Essential (primary) hypertension: Secondary | ICD-10-CM | POA: Diagnosis present

## 2018-03-14 DIAGNOSIS — I480 Paroxysmal atrial fibrillation: Secondary | ICD-10-CM | POA: Diagnosis present

## 2018-03-14 DIAGNOSIS — D62 Acute posthemorrhagic anemia: Secondary | ICD-10-CM | POA: Diagnosis present

## 2018-03-14 DIAGNOSIS — Z515 Encounter for palliative care: Secondary | ICD-10-CM

## 2018-03-14 LAB — TYPE AND SCREEN
ABO/RH(D): A POS
Antibody Screen: NEGATIVE
Unit division: 0
Unit division: 0
Unit division: 0
Unit division: 0

## 2018-03-14 LAB — BPAM RBC
Blood Product Expiration Date: 202001162359
Blood Product Expiration Date: 202001162359
Blood Product Expiration Date: 202001162359
Blood Product Expiration Date: 202001162359
ISSUE DATE / TIME: 201912291231
ISSUE DATE / TIME: 201912271018
ISSUE DATE / TIME: 201912271318
ISSUE DATE / TIME: 201912290851
UNIT TYPE AND RH: 6200
Unit Type and Rh: 6200
Unit Type and Rh: 6200
Unit Type and Rh: 6200

## 2018-03-14 LAB — BASIC METABOLIC PANEL
Anion gap: 7 (ref 5–15)
BUN: 83 mg/dL — AB (ref 8–23)
CO2: 25 mmol/L (ref 22–32)
Calcium: 7.9 mg/dL — ABNORMAL LOW (ref 8.9–10.3)
Chloride: 94 mmol/L — ABNORMAL LOW (ref 98–111)
Creatinine, Ser: 2.73 mg/dL — ABNORMAL HIGH (ref 0.61–1.24)
GFR calc Af Amer: 25 mL/min — ABNORMAL LOW (ref >60)
GFR calc non Af Amer: 22 mL/min — ABNORMAL LOW (ref >60)
Glucose, Bld: 108 mg/dL — ABNORMAL HIGH (ref 70–99)
Potassium: 4.8 mmol/L (ref 3.5–5.1)
Sodium: 126 mmol/L — ABNORMAL LOW (ref 135–145)

## 2018-03-14 LAB — CBC WITH DIFFERENTIAL/PLATELET
Abs Immature Granulocytes: 0.02 10*3/uL (ref 0.00–0.07)
Basophils Absolute: 0 10*3/uL (ref 0.0–0.1)
Basophils Relative: 0 %
Eosinophils Absolute: 0 10*3/uL (ref 0.0–0.5)
Eosinophils Relative: 1 %
HCT: 28 % — ABNORMAL LOW (ref 39.0–52.0)
Hemoglobin: 9.3 g/dL — ABNORMAL LOW (ref 13.0–17.0)
Immature Granulocytes: 0 %
Lymphocytes Relative: 5 %
Lymphs Abs: 0.3 10*3/uL — ABNORMAL LOW (ref 0.7–4.0)
MCH: 30.9 pg (ref 26.0–34.0)
MCHC: 33.2 g/dL (ref 30.0–36.0)
MCV: 93 fL (ref 80.0–100.0)
MONO ABS: 0.8 10*3/uL (ref 0.1–1.0)
Monocytes Relative: 16 %
NEUTROS ABS: 3.7 10*3/uL (ref 1.7–7.7)
Neutrophils Relative %: 78 %
Platelets: 204 10*3/uL (ref 150–400)
RBC: 3.01 MIL/uL — ABNORMAL LOW (ref 4.22–5.81)
RDW: 17.7 % — ABNORMAL HIGH (ref 11.5–15.5)
WBC: 4.8 10*3/uL (ref 4.0–10.5)
nRBC: 0 % (ref 0.0–0.2)

## 2018-03-14 LAB — PROTIME-INR
INR: 2.53
Prothrombin Time: 26.9 seconds — ABNORMAL HIGH (ref 11.4–15.2)

## 2018-03-14 LAB — PH, BODY FLUID: pH, Body Fluid: 7.5

## 2018-03-14 NOTE — Consult Note (Signed)
Physical Medicine and Rehabilitation Consult Reason for Consult:  Decreased functional mobility Referring Physician: [facility]   HPI: Mario Powell is a 72 y.o.right hand male with history of CAD, CABG, mechanical  Mitral valve 2011, endocarditis, hypertension, PAF on chronic Coumadin, prostate cancer, gastric neuroendocrine tumor, recurrent ascites with CKD stage IV. History taken from chart review, daughter, and patient. Patient lives with daughter. Prior to admission patient independent working full-time.Daughter is a full-time Electronics engineer.Presented 03/11/2018 with increasing shortness of breath, ascites, darkened stool. Imaging completed in the ED reveals ascites with left pleural effusion and bibasal or atelectasis. Underwent paracentesis 03/12/2018 remove 9 L of fluid. Gastroenterology consulted for hemoglobin 6.9. INR on admission of 2.65. patient was transfused. Gastroenterology services consulted await plan for endoscopy. Cardiology services follow-up for history of atrial fibrillation with mechanical valve and awaiting plan on anticoagulation. Therapy evaluation completed with recommendations of physical medicine rehabilitation consult.   Review of Systems  Constitutional: Negative for chills and fever.  HENT: Positive for hearing loss.   Eyes: Negative for blurred vision and double vision.  Respiratory: Positive for cough and shortness of breath.   Cardiovascular: Positive for leg swelling.  Gastrointestinal: Positive for constipation. Negative for nausea and vomiting.  Genitourinary: Negative for dysuria and flank pain.  Musculoskeletal: Positive for myalgias.  Skin: Negative for rash.  Neurological: Positive for weakness. Negative for sensory change.  All other systems reviewed and are negative.  Past Medical History:  Diagnosis Date  . Anasarca 12/01/2017  . Atrial fibrillation ([place]) 07/12/2013   Perioperative, 2001   . CAD (coronary artery disease) of artery bypass  graft 07/12/2013   Saphenous vein graft 2001   . Chronic anticoagulation 07/12/2013  . Essential hypertension 07/12/2013  . History of mitral valve replacement with mechanical valve 01/11/2013   Mechanical mitral valve 2001  Bacterial endocarditis as the cause   . HOH (hard of hearing)   . Prostate cancer ([place]) 07/12/2013   Past Surgical History:  Procedure Laterality Date  . BIOPSY  12/06/2017   Procedure: BIOPSY;  Surgeon: Otis Brace, MD;  Location: [facility];  Service: Gastroenterology;;  . BIOPSY  03/04/2018   Procedure: BIOPSY;  Surgeon: Otis Brace, MD;  Location: [facility];  Service: Gastroenterology;;  . COLONOSCOPY WITH PROPOFOL N/A 12/06/2017   Procedure: COLONOSCOPY WITH PROPOFOL ;  Surgeon: Otis Brace, MD;  Location: [place];  Service: Gastroenterology;  Laterality: N/A;  . CORONARY ARTERY BYPASS GRAFT     2001  . ESOPHAGOGASTRODUODENOSCOPY (EGD) WITH PROPOFOL N/A 12/06/2017   Procedure: ESOPHAGOGASTRODUODENOSCOPY (EGD) WITH PROPOFOL;  Surgeon: Otis Brace, MD;  Location: [facility];  Service: Gastroenterology;  Laterality: N/A;  . ESOPHAGOGASTRODUODENOSCOPY (EGD) WITH PROPOFOL N/A 03/04/2018   Procedure: ESOPHAGOGASTRODUODENOSCOPY (EGD) WITH PROPOFOL;  Surgeon: Otis Brace, MD;  [facility];  Service: Gastroenterology;  Laterality: N/A;  . IR PARACENTESIS  12/03/2017  . IR PARACENTESIS  01/04/2018  . IR PARACENTESIS  02/04/2018  . MITRAL VALVE REPLACEMENT  2001   Mechanical prosthesis  . POLYPECTOMY  12/06/2017   Procedure: POLYPECTOMY;  Surgeon: Otis Brace, MD;  Location: [facility];  Service: Gastroenterology;;  . POLYPECTOMY  03/04/2018   Procedure: POLYPECTOMY;  Surgeon: Otis Brace, MD;  [facility];  Service: Gastroenterology;;   Family History  Problem Relation Age of Onset  . Heart attack Father   . Healthy Mother   . Healthy Sister   . Healthy Sister    Social History:  reports that  he has  quit smoking. He has never used smokeless tobacco. He reports previous alcohol use. He reports that he does not use drugs. Allergies:  Allergies  Allergen Reactions  . Penicillins Other (See Comments)    DID THE REACTION INVOLVE: Swelling of the face/tongue/throat, SOB, or low BP? Unknown Sudden or severe rash/hives, skin peeling, or the inside of the mouth or nose? Unknown Did it require medical treatment? Yes When did it last happen?When pt was 72 years old Pt knows that his parents had to take him to the ER at the hospital and has not been retested with any PCN or cephalosporin that he is aware of.   Medications Prior to Admission  Medication Sig Dispense Refill  . Amino Acids-Protein Hydrolys (FEEDING SUPPLEMENT, PRO-STAT SUGAR FREE 64,) LIQD Take 30 mLs by mouth 3 (three) times daily with meals. (Patient taking differently: Take 30 mLs by mouth every evening. ) 900 mL 0  . bismuth subsalicylate (PEPTO BISMOL) 262 MG/15ML suspension Take 30 mLs by mouth as needed for indigestion.    . calcium-vitamin D (OSCAL WITH D) 500-200 MG-UNIT tablet Take 1 tablet by mouth 3 (three) times daily.     . Camphor-Eucalyptus-Menthol (VICKS VAPORUB EX) Apply 1 application topically See admin instructions. Apply under nose daily in the evening    . Cholecalciferol (VITAMIN D-3) 25 MCG (1000 UT) CAPS Take 1,000 Units by mouth daily.    Marland Kitchen enoxaparin (LOVENOX) 80 MG/0.8ML injection Inject 0.8 mLs (80 mg total) into the skin every 12 (twelve) hours. As Instructed by Coumad[facility] Clinic 20 Syringe 0  . ezetimibe (ZETIA) 10 MG tablet Take 1 tablet (10 mg total) by mouth daily. PLEASE CALL TO SCHEDULE F/U APPT FOR FUTURE REFILLS.Marland Kitchen3RD AND FINAL ATTEMPT! 15 tablet 0  . medium chain triglycerides (MCT OIL) oil Take 15 mLs by mouth 3 (three) times daily with meals. (Patient taking differently: Take 15 mLs by mouth at bedtime. ) 946 mL 12  . Multiple Vitamin (MULTIVITAMIN WITH MINERALS) TABS tablet Take 1 tablet by  mouth daily. 90 tablet 3  . Naphazoline-Pheniramine (OPCON-A) 0.027-0.315 % SOLN Place 2-3 drops into both eyes daily as needed (for dry eyes).    . Omega-3 Fatty Acids (FISH OIL) 1000 MG CAPS Take 2,000 mg by mouth daily.    . pantoprazole (PROTONIX) 40 MG tablet Take 1 tablet (40 mg total) by mouth daily. 90 tablet 3  . rosuvastatin (CRESTOR) 40 MG tablet Take 1 tablet (40 mg total) by mouth daily. PLEASE CALL TO SCHEDULE F/U APPT FOR FUTURE REFILLS.Marland Kitchen3RD AND FINAL ATTEMPT! 15 tablet 0  . spironolactone (ALDACTONE) 100 MG tablet Take 1 tablet (100 mg total) by mouth daily. 30 tablet 1  . torsemide (DEMADEX) 20 MG tablet Take 1 tablet (20 mg total) by mouth daily. 30 tablet 1  . warfarin (COUMADIN) 1 MG tablet TAKE AS DIRECTED BY ANTICOAGULATION CLINIC (Patient taking differently: Take 0.5-1 mg by mouth See admin instructions. Take 0.5 mg by mouth daily on Monday, Wednesday and Friday. Take 1 mg by mouth daily on all other days.) 120 tablet 0    Home: Home Living Family/patient expects to be discharged to:: Private residence Living Arrangements: Children Available Help at Discharge: Family Type of Home: House Home Access: Stairs to enter Technical brewer of Steps: 7 Entrance Stairs-Rails: Can reach both Home Layout: Two level Alternate Level Stairs-Number of Steps: 12 Alternate Level Stairs-Rails: Right, Left Bathroom Shower/Tub: Chiropodist: Standard Home Equipment: Cane - single point  Functional History: Prior Function  Level of Independence: Independent Comments: works at a desk job doing IT, driving Functional Status:  Mobility: Bed Mobility Overal bed mobility: Needs Assistance Bed Mobility: Supine to Sit Supine to sit: Supervision General bed mobility comments: supervision for safety, no physical assist Transfers Overall transfer level: Needs assistance Equipment used: 1 person hand held assist Transfers: Sit to/from Stand, Stand Pivot  Transfers Sit to Stand: Min assist, +2 safety/equipment Stand pivot transfers: Min assist, +2 safety/equipment General transfer comment: min assist +2 for safety, increased time and effort; cueing for hand placement  Ambulation/Gait General Gait Details: deferred due to dizziness    ADL: ADL Overall ADL's : Needs assistance/impaired Grooming: Set up, Sitting Upper Body Bathing: Set up, Sitting Lower Body Bathing: Minimal assistance, Sit to/from stand Upper Body Dressing : Minimal assistance, Sitting Lower Body Dressing: Minimal assistance, Sit to/from stand Toilet Transfer: Minimal assistance, +2 for safety/equipment, Stand-pivot Toilet Transfer Details (indicate cue type and reason): simulated to recliner  Toileting- Clothing Manipulation and Hygiene: Minimal assistance, Sit to/from stand, +2 for safety/equipment Functional mobility during ADLs: Minimal assistance, +2 for safety/equipment General ADL Comments: pt limited by generalized weakness, impaired balance and activity tolerance  Cognition: Cognition Overall Cognitive Status: Within Functional Limits for tasks assessed Orientation Level: Oriented X4 Cognition Arousal/Alertness: Awake/alert Behavior During Therapy: WFL for tasks assessed/performed Overall Cognitive Status: Within Functional Limits for tasks assessed  Blood pressure 96/61, pulse 73, temperature 97.8 F (36.6 C), temperature source Oral, resp. rate 18, height 5\' 11"  (1.803 m), weight 78.1 kg, SpO2 98 %. Physical Exam  Vitals reviewed. Constitutional: He is oriented to person, place, and time. He appears well-developed and well-nourished.  HENT:  Head: Normocephalic and atraumatic.  Eyes: EOM are normal. Right eye exhibits no discharge. Left eye exhibits no discharge.  Neck: Normal range of motion. Neck supple. No thyromegaly present.  Cardiovascular: Normal rate and regular rhythm.  Respiratory:  Decreased breath sounds at the bases but clear to  auscultation  GI: Soft. Bowel sounds are normal. He exhibits no distension.  Musculoskeletal:     Comments: B/l LE elephantiasis  Neurological: He is alert and oriented to person, place, and time.  Motor: B/l UE 5/5 proximal to distal B/l LE: HF 3+-4-/5, KE 4/5, ADF 4+/5 HOH Dysarthria with apraxic speech  Skin: Skin is warm and dry.  Vascular changes b/l LE  Psychiatric: His affect is blunt. His speech is delayed, tangential and slurred. He is slowed. Cognition and memory are normal.    Results for orders placed or performed during the hospital encounter of 03/10/18 (from the past 24 hour(s))  Prepare RBC     Status: None   Collection Time: 03/13/18  7:38 AM  Result Value Ref Range   Order Confirmation      ORDER PROCESSED BY BLOOD BANK Performed at [facility], [place] [address], [place], [place] [address]   CBC     Status: Abnormal   Collection Time: 03/13/18  6:13 PM  Result Value Ref Range   WBC 4.8 4.0 - 10.5 K/uL   RBC 3.06 (L) 4.22 - 5.81 MIL/uL   Hemoglobin 9.4 (L) 13.0 - 17.0 g/dL   HCT 28.6 (L) 39.0 - 52.0 %   MCV 93.5 80.0 - 100.0 fL   MCH 30.7 26.0 - 34.0 pg   MCHC 32.9 30.0 - 36.0 g/dL   RDW 17.7 (H) 11.5 - 15.5 %   Platelets 207 150 - 400 K/uL   nRBC 0.0 0.0 - 0.2 %  US Paracentesis  Result Date: 03/12/2018 INDICATION: Abdominal distention and discomfort. Ascites. Request for diagnostic and therapeutic paracentesis. EXAM: ULTRASOUND GUIDED RIGHT LOWER QUADRANT PARACENTESIS MEDICATIONS: None. COMPLICATIONS: None immediate. PROCEDURE: Informed written consent was obtained from the patient after a discussion of the risks, benefits and alternatives to treatment. A timeout was performed prior to the initiation of the procedure. Initial ultrasound scanning demonstrates a large amount of ascites within the right lower abdominal quadrant. The right lower abdomen was prepped and draped in the usual sterile fashion. 1% lidocaine was used for local anesthesia.  Following this, a 19 gauge, 7-cm, Yueh catheter was introduced. An ultrasound image was saved for documentation purposes. The paracentesis was performed. The catheter was removed and a dressing was applied. The patient tolerated the procedure well without immediate post procedural complication. FINDINGS: A total of approximately 9 L of chylous yellow fluid was removed. Samples were sent to the laboratory as requested by the clinical team. IMPRESSION: Successful ultrasound-guided paracentesis yielding 9 liters of peritoneal fluid. Read by: Ascencion Dike PA-C Electronically Signed   By: Markus Daft M.D.   On: 03/12/2018 12:00    Assessment/Plan: Diagnosis: Debility Labs independently reviewed.  Records reviewed and summated above.  1. Does the need for close, 24 hr/day medical supervision in concert with the patient's rehab needs make it unreasonable for this patient to be served in a less intensive setting? Potentially  2. Co-Morbidities requiring supervision/potential complications: ABLA (repeat labs, transfuse to ensure appropriate perfusion again for increased activity tolerance), CAD, CABG, mechanical  Mitral valve 2011, endocarditis, HTN (monitor and provide prns in accordance with increased physical exertion and pain), PAF (await recs for anticoagulation, monitor HR with increased activity), prostate cancer, gastric neuroendocrine tumor, recurrent ascites, CKD stage IV (avoid nephrotoxic meds), hyponatremia (cont to monitor, treat if necessary, trending down) 3. Due to bladder management, safety, skin/wound care, disease management and patient education, does the patient require 24 hr/day rehab nursing? Yes 4. Does the patient require coordinated care of a physician, rehab nurse, PT (1-2 hrs/day, 5 days/week) and OT (1-2 hrs/day, 5 days/week) to address physical and functional deficits in the context of the above medical diagnosis(es)? Yes Addressing deficits in the following areas: balance,  endurance, locomotion, strength, transferring, bathing, dressing, toileting, speech, language and psychosocial support 5. Can the patient actively participate in an intensive therapy program of at least 3 hrs of therapy per day at least 5 days per week? Potentially 6. The potential for patient to make measurable gains while on inpatient rehab is excellent 7. Anticipated functional outcomes upon discharge from inpatient rehab are supervision  with PT, supervision with OT, independent and modified independent with SLP. 8. Estimated rehab length of stay to reach the above functional goals is: 8-13 days. 9. Anticipated D/C setting: Home 10. Anticipated post D/C treatments: HH therapy and Home excercise program 11. Overall Rehab/Functional Prognosis: good  RECOMMENDATIONS: This patient's condition is appropriate for continued rehabilitative care in the following setting: Will reevaluate after medical workup/final plans complete.  CIR if patient able to tolerate and caregiver support arranged. Patient has agreed to participate in recommended program. Potentially Note that insurance prior authorization may be required for reimbursement for recommended care.  Comment: Rehab Admissions Coordinator to follow up.   I have personally performed a face to face diagnostic evaluation, including, but not limited to relevant history and physical exam findings, of this patient and developed relevant assessment and plan.  Additionally, I have reviewed and concur with the physician assistant's  documentation above.   Delice Lesch, MD, ABPMR Lavon Paganini Angiulli, PA-C 03/14/2018

## 2018-03-14 NOTE — Progress Notes (Signed)
Subjective: The patient was seen and examined at bedside. Has not had a bowel movement in 3 days.  Objective: Vital signs in last 24 hours: Temp:  [97.3 F (36.3 C)-99.1 F (37.3 C)] 97.7 F (36.5 C) (12/30 1059) Pulse Rate:  [73-84] 77 (12/30 1059) Resp:  [18-20] 19 (12/30 1059) BP: (96-119)/(55-72) 97/55 (12/30 1059) SpO2:  [98 %-100 %] 100 % (12/30 1059) Weight:  [78.1 kg] 78.1 kg (12/30 0350) Weight change: 0.3 kg Last BM Date: 03/13/18  OI:ZTIW pallor GENERAL:not in distress ABDOMEN:(status post 9 L paracentesis on 03/12/18), nontender EXTREMITIES:no deformity  Lab Results: Results for orders placed or performed during the hospital encounter of 03/10/18 (from the past 48 hour(s))  CBC     Status: Abnormal   Collection Time: 03/12/18  2:33 PM  Result Value Ref Range   WBC 3.9 (L) 4.0 - 10.5 K/uL   RBC 2.51 (L) 4.22 - 5.81 MIL/uL   Hemoglobin 7.7 (L) 13.0 - 17.0 g/dL   HCT 23.5 (L) 39.0 - 52.0 %   MCV 93.6 80.0 - 100.0 fL   MCH 30.7 26.0 - 34.0 pg   MCHC 32.8 30.0 - 36.0 g/dL   RDW 17.5 (H) 11.5 - 15.5 %   Platelets 181 150 - 400 K/uL   nRBC 0.0 0.0 - 0.2 %    Comment: Performed at [facility], [address] [place], [place] [address]  Heparin level (unfractionated)     Status: None   Collection Time: 03/12/18  8:49 PM  Result Value Ref Range   Heparin Unfractionated 0.42 0.30 - 0.70 IU/mL    Comment: (NOTE) If heparin results are below expected values, and patient dosage has  been confirmed, suggest follow up testing of antithrombin III levels. Performed at [facility], [place] [address], [place], [place] [address]   Protime-INR     Status: Abnormal   Collection Time: 03/13/18  5:23 AM  Result Value Ref Range   Prothrombin Time 33.1 (H) 11.4 - 15.2 seconds   INR 3.31     Comment: Performed at [facility] [address] [place] [address]  CBC     Status: Abnormal   Collection Time: 03/13/18  5:23 AM  Result Value Ref Range   WBC 4.5 4.0 - 10.5 K/uL   RBC 2.23 (L) 4.22 - 5.81 MIL/uL   Hemoglobin 6.9 (LL) 13.0 - 17.0 g/dL    Comment: REPEATED TO VERIFY THIS CRITICAL RESULT HAS VERIFIED AND BEEN CALLED TO R DUNN RN BY TIFFANY SHORT ON 12 29 2019 AT 0712, AND HAS BEEN READ BACK.     HCT 21.2 (L) 39.0 - 52.0 %   MCV 95.1 80.0 - 100.0 fL   MCH 30.9 26.0 - 34.0 pg   MCHC 32.5 30.0 - 36.0 g/dL   RDW 17.3 (H) 11.5 - 15.5 %   Platelets 200 150 - 400 K/uL   nRBC 0.0 0.0 - 0.2 %    Comment: Performed at [facility] [address] [place], [place] [address]  Basic metabolic panel     Status: Abnormal   Collection Time: 03/13/18  5:23 AM  Result Value Ref Range   Sodium 130 (L) 135 - 145 mmol/L   Potassium 4.5 3.5 - 5.1 mmol/L   Chloride 96 (L) 98 - 111 mmol/L   CO2 27 22 - 32 mmol/L   Glucose, Bld 117 (H) 70 - 99 mg/dL   BUN 96 (H) 8 -  23 mg/dL   Creatinine, Ser 3.30 (H) 0.61 - 1.24 mg/dL   Calcium 7.9 (L) 8.9 - 10.3 mg/dL   GFR calc non Af Amer 17 (L) >60 mL/min   GFR calc Af Amer 20 (L) >60 mL/min   Anion gap 7 5 - 15    Comment: Performed at [facility] [address], [place], [place] [address]  Heparin level (unfractionated)     Status: None   Collection Time: 03/13/18  5:23 AM  Result Value Ref Range   Heparin Unfractionated 0.52 0.30 - 0.70 IU/mL    Comment: (NOTE) If heparin results are below expected values, and patient dosage has  been confirmed, suggest follow up testing of antithrombin III levels. Performed at [facility], [place] [address], [place], [place] [address]   Prepare RBC     Status: None   Collection Time: 03/13/18  7:38 AM  Result Value Ref Range   Order Confirmation      ORDER PROCESSED BY BLOOD BANK Performed at [facility], [place] [address], [place], [place] [address]   CBC     Status: Abnormal   Collection Time: 03/13/18  6:13 PM  Result Value Ref Range   WBC 4.8 4.0 - 10.5 K/uL   RBC 3.06 (L) 4.22 - 5.81 MIL/uL   Hemoglobin 9.4 (L) 13.0 - 17.0  g/dL    Comment: REPEATED TO VERIFY POST TRANSFUSION SPECIMEN    HCT 28.6 (L) 39.0 - 52.0 %   MCV 93.5 80.0 - 100.0 fL   MCH 30.7 26.0 - 34.0 pg   MCHC 32.9 30.0 - 36.0 g/dL   RDW 17.7 (H) 11.5 - 15.5 %   Platelets 207 150 - 400 K/uL   nRBC 0.0 0.0 - 0.2 %    Comment: Performed at [facility], [place] [address], [place], [place] [address]  Protime-INR     Status: Abnormal   Collection Time: 03/14/18  4:14 AM  Result Value Ref Range   Prothrombin Time 26.9 (H) 11.4 - 15.2 seconds   INR 2.53     Comment: Performed at [facility] [address] [place], [place] [address]  CBC with Differential/Platelet     Status: Abnormal   Collection Time: 03/14/18  4:14 AM  Result Value Ref Range   WBC 4.8 4.0 - 10.5 K/uL   RBC 3.01 (L) 4.22 - 5.81 MIL/uL   Hemoglobin 9.3 (L) 13.0 - 17.0 g/dL   HCT 28.0 (L) 39.0 - 52.0 %   MCV 93.0 80.0 - 100.0 fL   MCH 30.9 26.0 - 34.0 pg   MCHC 33.2 30.0 - 36.0 g/dL   RDW 17.7 (H) 11.5 - 15.5 %   Platelets 204 150 - 400 K/uL   nRBC 0.0 0.0 - 0.2 %   Neutrophils Relative % 78 %   Neutro Abs 3.7 1.7 - 7.7 K/uL   Lymphocytes Relative 5 %   Lymphs Abs 0.3 (L) 0.7 - 4.0 K/uL   Monocytes Relative 16 %   Monocytes Absolute 0.8 0.1 - 1.0 K/uL   Eosinophils Relative 1 %   Eosinophils Absolute 0.0 0.0 - 0.5 K/uL   Basophils Relative 0 %   Basophils Absolute 0.0 0.0 - 0.1 K/uL   Immature Granulocytes 0 %   Abs Immature Granulocytes 0.02 0.00 - 0.07 K/uL    Comment: Performed at [facility], [place] [address] [place], [place] [address]  Basic metabolic panel  Status: Abnormal   Collection Time: 03/14/18  4:14 AM  Result Value Ref Range   Sodium 126 (L) 135 - 145 mmol/L   Potassium 4.8 3.5 - 5.1 mmol/L   Chloride 94 (L) 98 - 111 mmol/L   CO2 25 22 - 32 mmol/L   Glucose, Bld 108 (H) 70 - 99 mg/dL   BUN 83 (H) 8 - 23 mg/dL   Creatinine, Ser 2.73 (H) 0.61 - 1.24 mg/dL   Calcium 7.9 (L) 8.9 - 10.3 mg/dL   GFR calc non Af Amer 22 (L) >60  mL/min   GFR calc Af Amer 25 (L) >60 mL/min   Anion gap 7 5 - 15    Comment: Performed at [facility] [address] [place], [place] [address]    Studies/Results: No results found.  Medications: I have reviewed the patient's current medications.  Assessment: Melena likely related to post-polypectomy bleed after EGD on 03/04/18, while patient was on warfarin for mechanical mitral valve,history of endocarditis.  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 if INR is less than 2. If bleeding site is identified, plan on Endo Clip placement to decrease risk of bleeding on resumption of anticoagulation.  Continue Protonix 40 mg every 12 hours.  Patient on Lasix 20 mg IV daily and spironolactone 25 daily for chylous ascites and peripheral edema.  Ronnette Juniper 03/14/2018, 12:14 PM   Pager [phone] If no answer or after 5 PM call [phone]

## 2018-03-14 NOTE — Progress Notes (Addendum)
Family Medicine Teaching Service Daily Progress Note Intern Pager: [phone]  Patient name: Mario Powell Medical record number: [id number] Date of birth: Nov 04, 1945 Age: 72 y.o. Gender: male  Primary Care Provider: Wilber Oliphant, MD Consultants: GI Code Status: Full code  Pt Overview and Major Events to Date:  Hospital Day 4 Admitted: 03/10/2018 12/28 - large volume paracentesis (9 liters)  Assessment and Plan: Mario Powell is a 72 y.o. male presenting with increasing shortness of breath, ascites, darkened stools for the past few days, and found to have a possible LLL pneumonia on chest x-ray. PMH is significant for gastric neuroendocrine polyps, history of mitral valve replacement with mechanical valve on chronic anticoagulation, HTN, prostate cancer s/p treatment, paroxysmal atrial fibrillation, anasarca of unknown source, CKD, normocytic anemia, hyponatremia, and chronic left pleural effusion.   Shortness of breath, multifactorial 2/2 ascites, chronic pleural effusion, LL Pneumonia, Anemia from GI bleed Large volume paracentesis 12/28, 9 liters. PT/OT recommend CIR. who noted that he slept well overnight he is breathing comfortably on room air. -spironolactone 25mg  - lasix 20mg  - Levaquin PO (12/26-1/1 ), started in the ED, for pneumonia coverage - Incentive spirometry - Consider thoracentesis for chronic left effusion - Oxygen therapy, wean as tolerated  Acute GI bleed.   Hgb 8.7>7.5>6.0.>2 UpRBC>8.7>8.1, 7.7>6.9>2 units RBCs >9.4(12/30). Highest suspicion is from upper GI bleed from recent upper endoscopy with biopsy. GI planning upper scope again one INR is stable(a couple more days, per last note). Anticoagulation complicated by his mechanical heart valve requiring constant anticoagulation. Will touch base with cardiology and GI today about current [facility] plan as it pertains to upper endoscopy. - GI consulted , appreciate recommendations  - hold heparin, continue to get INR -  Protonix 40 mg twice daily - monitor Hgb - Monitor melena   Anasarca, associated with ascites: Acute on chronic. Improved with 9L removed with paracentesis 12/28. Marland Kitchen  Also with 3+ pitting edema to bilateral lower extremity. Recently started on torsemide 20 mg and spironolactone 100 mg by his PCP for further diuresis. SAAG: 0.7 (unlikely to be from portal vein hypertension), LD 120, triglycerides 1,113, milky, WBCs 342. - lasix 20mg  IV daily - spironolactone 25mg  daily - Continued work-up with GI and PCP for exact etiology, may require lymphoscintigraphy or similar MR imaging for evaluation of obstrucion  AKI on CKD:  Cr 3.42>3.44>3.59>3.3>2.73, BL appears to be around 2.21.  Patient was recently started on spironolactone and furosemide on 11/14 by his PCP.  - spironolactone - lasix daily - Avoid nephrotoxic medications as possible - Monitor fluid status closely - Strict I and O  Hyponatremia: Chronic.  Na 130>129>131>130>126(12/30), baseline appears around 131-135.  Difficult to assess whether this is related to fluid overload versus intravascularly depleted in setting of diuresis. -Monitor CMP - Consider urine sodium, osmol if not improving  Gastric neuroendocrine tumor: Acute, recurrent.  Previously diagnosed and biopsied with small margins via EGD in September.  However recently had an EGD on 12/20 with surgical pathology showing additional well differentiated neuroendocrine tumor extending to the edge of biopsy.  Follows with [facility] and Dr. Annamaria Powell with hematology/oncology. - Ensure this is addressed during GI follow-up  History of mitral valve replacement with mechanical valve: Chronic, stable. Chronically on anticoagulation with warfarin.  INR therapeutic, 2.65>2.8>2.49>3.31.  Goal 2.5-3.5 due to his mechanical valve. Started heparin when INR fell to 2.49. INR has since increased with drop in hgb.   - cardiology consult for management of warfarin/heparin - hold heparin/warfarin  for EGD  Severe protein-calorie malnutrition: Chronic.  Albumin 1.8>2.1.  Family states he has a decreased appetite. - Nutrition consult  Paroxysmal atrial fibrillation: Chronic, stable. Currently in sinus rhythm, HR 80-100.  On chronic warfarin therapy as above, not on any rate controlling regimen. Holding warfarin and heparin at the moment - Monitor on telemetry  History of prostate cancer: Stable.  Patient reports this was 2 decades ago, s/p treatment.  No current concerns. -Monitor outpatient  Chronic hypertension: Stable. 97/67 this am.  Had 8am bp of 92/50 with map 60.   On diuretic therapy with spironolactone and torsemide has been controlling his pressure. - Holding home diuretic therapy in the setting of AKI, patient normotensive  Vitamin B12 deficiency: Chronic, stable.  Follows with Dr. Annamaria Powell outpatient for injections monthly, last injection on 11/20. - Consider vitamin B12 injection vs deferment to outpatient  Normocytic anemia: Chronic.  Previously megaloblastic, receiving B12 injections monthly.  Hemoglobin 8.7, baseline around 9-10. Likely in the setting of blood loss as above.  Asymptomatic. - Monitor CBC  FEN/GI: clear liquid diet Prophylaxis: holding AC in preparation for upper endoscopy  Disposition: DC to CIR following resolution of GI bleed.  Subjective:  Mario Powell is resting comfortably in bed this morning and woke up for interview.  He reported that he slept well through the night.  He had no new concerns at this time and denied headache, chest pain, stomach pain.  He reported that he has been eating his clear liquid diet without issue.  He reported that his lower extremity edema has been present for years but is currently the best that he has seen at 3 years.  Objective: Temp:  [97 F (36.1 C)-99.1 F (37.3 C)] 97.8 F (36.6 C) (12/30 0350) Pulse Rate:  [73-87] 73 (12/30 0350) Resp:  [18-20] 18 (12/30 0350) BP: (92-119)/(50-72) 96/61 (12/30  0350) SpO2:  [98 %-100 %] 98 % (12/30 0350) Weight:  [77.8 kg-78.1 kg] 78.1 kg (12/30 0350) Intake/Output 12/29 0701 - 12/30 0700 In: 2607 [P.O.:1860; Blood:747] Out: 2925 [Urine:2925]  Physical Exam:  General: Alert and cooperative and appears to be in no acute distress.  Hard of hearing but in good spirits this morning.  Extremely thin gentleman with anasarca evident in his upper and lower extremities. HEENT: Neck non-tender without lymphadenopathy, masses or thyromegaly Cardio: Normal A1 and S2, no S3 or S4. Rhythm is regular. Pulm: Clear to auscultation bilaterally, no crackles, wheezing, or diminished breath sounds. Normal respiratory effort Abdomen: Bowel sounds normal. Abdomen soft and non-tender.  Extremities: Significant upper and lower extremity edema.  Upper extremity edema noticeable in hands primarily.  Lower extremity edema is impressive with 3+ pitting edema extending at least up to his knees. Neuro: Cranial nerves grossly intact   Laboratory: Recent Labs  Lab 03/12/18 1433 03/13/18 0523 03/13/18 1813  WBC 3.9* 4.5 4.8  HGB 7.7* 6.9* 9.4*  HCT 23.5* 21.2* 28.6*  PLT 181 200 207   Recent Labs  Lab 03/10/18 1408 03/11/18 0607 03/11/18 1621 03/12/18 0346 03/13/18 0523  NA 130* 129*  --  131* 130*  K 5.3* 5.4*  --  4.9 4.5  CL 98 96*  --  97* 96*  CO2 21* 24  --  24 27  BUN 70* 83*  --  93* 96*  CREATININE 3.42* 3.44*  --  3.59* 3.30*  CALCIUM 8.8* 8.3*  --  8.4* 7.9*  PROT 5.4* 4.2* 4.8* 4.7*  --   BILITOT 0.9 0.5  --  0.5  --   ALKPHOS 61 45  --  54  --   ALT 29 21  --  22  --   AST 35 22  --  26  --   GLUCOSE 150* 128*  --  129* 117*    Imaging/Diagnostic Tests:     Matilde Haymaker, MD 03/14/2018, 5:43 AM PGY-1, [place] [facility] Intern pager: [phone], text pages welcome

## 2018-03-14 NOTE — Progress Notes (Signed)
Inpatient Rehabilitation Admissions Coordinator  I met with patient and his daughter at bedside to discuss his need for further therapies pending his functional progress with therapies once medical work up is complete. Noted plans for endoscopy pending. I will follow up tomorrow.  Danne Baxter, RN, MSN Rehab Admissions Coordinator [phone] 03/14/2018 12:14 PM

## 2018-03-14 NOTE — Care Management Important Message (Signed)
Important Message  Patient Details  Name: Mario Powell MRN: [id number] Date of Birth: May 13, 1945   Medicare Important Message Given:  Yes    Indria Bishara P Khiya Friese 03/14/2018, 3:20 PM

## 2018-03-14 NOTE — Consult Note (Signed)
Consultation Note Date: 03/14/18  Patient Name: Mario Powell  DOB: 1945/12/13  MRN: [id number]  Age / Sex: 72 y.o., male  PCP: Wilber Oliphant, MD Referring Physician: Lind Covert, MD  Reason for Consultation: Establishing goals of care  HPI/Patient Profile: 72 y.o. male  with past medical history of mitral valve replacement on chronic anticoagulation, CAD, HTN, afib, anasarca, neuroendocrine tumor, prostate cancer admitted on 03/10/2018 with shortness of breath and dark stools. Shortness of breath secondary to LLL pneumonia, ascites, chronic pleural effusion, and anemia from GI bleed. GI following for acute GI bleed. Pending repeat EGD. Patient has had resected neuroendocrine tumors 12/06/17 and 03/04/18. Surgical pathology showing well differentiated neuroendocrine tumor extending to edge of biopsy. Oncology following. Palliative medicine consultation for goals of care.   Clinical Assessment and Goals of Care:  I have reviewed medical records, discussed with attending and met with patient and daughter ([name]) at beside to discuss diagnosis, GOC, EOL wishes, disposition and options.  Introduced Palliative Medicine as specialized medical care for people living with serious illness. It focuses on providing relief from the symptoms and stress of a serious illness. The goal is to improve quality of life for both the patient and the family.  We discussed a brief life review of the patient. 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 He has two supportive daughters.   Patient explains recurrent BLE edema for 20+ years since mitral valve replacement.  Patient and daughter speak of their frustrations with unknown answers with health decline since September, including ongoing anasarca and ascites (requiring 4 paracentesis now) and neuroendocrine tumors. Reviewed  oncology notes with patient and daughter. Daughter speaks of him being referred to lymphatic specialist at [facility] but he has not gone.    I attempted to elicit values and goals of care important to the patient and daughter. They are eager to have repeat EGD in the next few days, he is hoping tomorrow. Discussed plan for inpatient rehab with plan to return home following rehab. Also, daughter speaks of her wish to have scheduled outpatient paracentesis. They hope to find more answers.   Patient interrupts conversation requesting privacy to use the bathroom. He asks that I check back in tomorrow after EGD.   Questions and concerns were addressed to the best of my ability. PMT contact information given to daughter.     SUMMARY OF RECOMMENDATIONS    Initial discussion with patient and daughter who hope to find more answers from specialists.   FULL code/FULL scope treatment.   Pending repeat EGD. GI following.  Encouraged daughter to arrange appointment with [facility] lymphatic specialist (was referred outpatient and patient has not seen).   Daughter requesting scheduled outpatient paracentesis to help manage recurrent ascites. Will notify attending.   Code Status/Advance Care Planning:  Full code  Symptom Management:   Per attending  Palliative Prophylaxis:   Aspiration, Frequent Pain Assessment and Turn Reposition  Additional Recommendations (Limitations, Scope,  Preferences):  Full Scope Treatment  Psycho-social/Spiritual:   Desire for further Chaplaincy support:yes  Additional Recommendations: Caregiving  Support/Resources  Prognosis:   Unable to determine  Discharge Planning: To Be Determined: Possibly CIR      Primary Diagnoses: Present on Admission: . SOB (shortness of breath) . Anasarca   I have reviewed the medical record, interviewed the patient and family, and examined the patient. The following aspects are pertinent.  Past Medical History:  Diagnosis Date    . Anasarca 12/01/2017  . Atrial fibrillation ([place]) 07/12/2013   Perioperative, 2001   . CAD (coronary artery disease) of artery bypass graft 07/12/2013   Saphenous vein graft 2001   . Chronic anticoagulation 07/12/2013  . Essential hypertension 07/12/2013  . History of mitral valve replacement with mechanical valve 01/11/2013   Mechanical mitral valve 2001  Bacterial endocarditis as the cause   . HOH (hard of hearing)   . Prostate cancer ([place]) 07/12/2013   Social History   Socioeconomic History  . Marital status: Widowed    Spouse name: Not on file  . Number of children: Not on file  . Years of education: Not on file  . Highest education level: Not on file  Occupational History  . Not on file  Social Needs  . Financial resource strain: Not on file  . Food insecurity:    Worry: Not on file    Inability: Not on file  . Transportation needs:    Medical: Not on file    Non-medical: Not on file  Tobacco Use  . Smoking status: Former Research scientist (life sciences)  . Smokeless tobacco: Never Used  . Tobacco comment: QUIT IN 09/1982  Substance and Sexual Activity  . Alcohol use: Not Currently    Comment: NO ALCOHOL SINCE CHRISTMAS 2017  . Drug use: Never  . Sexual activity: Not on file  Lifestyle  . Physical activity:    Days per week: Not on file    Minutes per session: Not on file  . Stress: Not on file  Relationships  . Social connections:    Talks on phone: Not on file    Gets together: Not on file    Attends religious service: Not on file    Active member of club or organization: Not on file    Attends meetings of clubs or organizations: Not on file    Relationship status: Not on file  Other Topics Concern  . Not on file  Social History Narrative   Daughters Judson Roch and Sharyn Lull   Works for [employer], mainly at desk all day.    Performs all ADLs   Family History  Problem Relation Age of Onset  . Heart attack Father   . Healthy Mother   . Healthy Sister   . Healthy Sister    Scheduled  Meds: . [MAR Hold] sodium chloride   Intravenous Once  . [MAR Hold] ezetimibe  10 mg Oral Daily  . [MAR Hold] feeding supplement  1 Container Oral TID BM  . [MAR Hold] feeding supplement (PRO-STAT SUGAR FREE 64)  30 mL Oral TID BM  . [MAR Hold] furosemide  20 mg Intravenous Daily  . [MAR Hold] levofloxacin  750 mg Oral Q48H  . [MAR Hold] multivitamin with minerals  1 tablet Oral Daily  . [MAR Hold] pantoprazole (PROTONIX) IV  40 mg Intravenous Q12H  . [MAR Hold] rosuvastatin  40 mg Oral Daily  . [MAR Hold] spironolactone  25 mg Oral Daily   Continuous Infusions: . sodium chloride  PRN Meds:.[MAR Hold] acetaminophen, [MAR Hold] polyethylene glycol, [MAR Hold] polyvinyl alcohol Medications Prior to Admission:  Prior to Admission medications   Medication Sig Start Date End Date Taking? Authorizing Provider  Amino Acids-Protein Hydrolys (FEEDING SUPPLEMENT, PRO-STAT SUGAR FREE 64,) LIQD Take 30 mLs by mouth 3 (three) times daily with meals. Patient taking differently: Take 30 mLs by mouth every evening.  12/09/17  Yes Wilber Oliphant, MD  bismuth subsalicylate (PEPTO BISMOL) 262 MG/15ML suspension Take 30 mLs by mouth as needed for indigestion.   Yes [provider]  calcium-vitamin D (OSCAL WITH D) 500-200 MG-UNIT tablet Take 1 tablet by mouth 3 (three) times daily.    Yes [provider]  Camphor-Eucalyptus-Menthol (VICKS VAPORUB EX) Apply 1 application topically See admin instructions. Apply under nose daily in the evening   Yes [provider]  Cholecalciferol (VITAMIN D-3) 25 MCG (1000 UT) CAPS Take 1,000 Units by mouth daily.   Yes [provider]  enoxaparin (LOVENOX) 80 MG/0.8ML injection Inject 0.8 mLs (80 mg total) into the skin every 12 (twelve) hours. As Instructed by Coumadin Clinic 02/25/18  Yes Belva Crome, MD  ezetimibe (ZETIA) 10 MG tablet Take 1 tablet (10 mg total) by mouth daily. PLEASE CALL TO SCHEDULE F/U APPT FOR FUTURE REFILLS.Marland Kitchen3RD  AND FINAL ATTEMPT! 02/25/18  Yes Belva Crome, MD  medium chain triglycerides (MCT OIL) oil Take 15 mLs by mouth 3 (three) times daily with meals. Patient taking differently: Take 15 mLs by mouth at bedtime.  12/08/17  Yes Wilber Oliphant, MD  Multiple Vitamin (MULTIVITAMIN WITH MINERALS) TABS tablet Take 1 tablet by mouth daily. 12/09/17  Yes Wilber Oliphant, MD  Naphazoline-Pheniramine (OPCON-A) 0.027-0.315 % SOLN Place 2-3 drops into both eyes daily as needed (for dry eyes).   Yes [provider]  Omega-3 Fatty Acids (FISH OIL) 1000 MG CAPS Take 2,000 mg by mouth daily.   Yes [provider]  pantoprazole (PROTONIX) 40 MG tablet Take 1 tablet (40 mg total) by mouth daily. 02/15/18  Yes Wilber Oliphant, MD  rosuvastatin (CRESTOR) 40 MG tablet Take 1 tablet (40 mg total) by mouth daily. PLEASE CALL TO SCHEDULE F/U APPT FOR FUTURE REFILLS.Marland Kitchen3RD AND FINAL ATTEMPT! 02/25/18  Yes Belva Crome, MD  spironolactone (ALDACTONE) 100 MG tablet Take 1 tablet (100 mg total) by mouth daily. 01/27/18  Yes Wilber Oliphant, MD  torsemide (DEMADEX) 20 MG tablet Take 1 tablet (20 mg total) by mouth daily. 01/27/18  Yes Wilber Oliphant, MD  warfarin (COUMADIN) 1 MG tablet TAKE AS DIRECTED BY ANTICOAGULATION CLINIC Patient taking differently: Take 0.5-1 mg by mouth See admin instructions. Take 0.5 mg by mouth daily on Monday, Wednesday and Friday. Take 1 mg by mouth daily on all other days. 01/24/18  Yes Belva Crome, MD   Allergies  Allergen Reactions  . Penicillins Other (See Comments)    DID THE REACTION INVOLVE: Swelling of the face/tongue/throat, SOB, or low BP? Unknown Sudden or severe rash/hives, skin peeling, or the inside of the mouth or nose? Unknown Did it require medical treatment? Yes When did it last happen?When pt was 72 years old Pt knows that his parents had to take him to the ER at the hospital and has not been retested with any PCN or cephalosporin that he is aware of.   Review of  Systems  Constitutional: Positive for activity change.  Gastrointestinal: Positive for abdominal distention and abdominal pain.  Neurological: Positive for weakness.  Physical Exam Vitals signs and nursing note reviewed.  Constitutional:      General: He is awake.  HENT:     Head: Normocephalic and atraumatic.  Cardiovascular:     Rate and Rhythm: Normal rate.  Pulmonary:     Effort: No tachypnea, accessory muscle usage or respiratory distress.  Abdominal:     Tenderness: There is no abdominal tenderness.  Neurological:     Mental Status: He is alert and oriented to person, place, and time.     Comments: Hard of hearing  Psychiatric:        Mood and Affect: Mood normal.        Speech: Speech normal.        Behavior: Behavior normal.        Cognition and Memory: Cognition normal.    Vital Signs: BP (!) 100/56   Pulse 76   Temp 97.7 F (36.5 C) (Oral)   Resp 18   Ht '5\' 11"'  (1.803 m)   Wt 78.7 kg   SpO2 100%   BMI 24.21 kg/m  Pain Scale: 0-10   Pain Score: 0-No pain   SpO2: SpO2: 100 % O2 Device:SpO2: 100 % O2 Flow Rate: .O2 Flow Rate (L/min): 2 L/min  IO: Intake/output summary:   Intake/Output Summary (Last 24 hours) at 03/15/2018 1610 Last data filed at 03/15/2018 9604 Gross per 24 hour  Intake 120 ml  Output 1400 ml  Net -1280 ml    LBM: Last BM Date: 03/13/18 Baseline Weight: Weight: 82.5 kg Most recent weight: Weight: 78.7 kg     Palliative Assessment/Data: PPS 50%   Flowsheet Rows     Most Recent Value  Intake Tab  Referral Department  Hospitalist  Unit at Time of Referral  Med/Surg Unit  Palliative Care Primary Diagnosis  -- [GIB, recurrent ascites, neuroendocrine tumor]  Palliative Care Type  New Palliative care  Date first seen by Palliative Care  03/14/18  Clinical Assessment  Palliative Performance Scale Score  50%  Psychosocial & Spiritual Assessment  Palliative Care Outcomes  Patient/Family meeting held?  Yes  Who was at the  meeting?  patient and daughter  Palliative Care Outcomes  Clarified goals of care, Provided psychosocial or spiritual support, ACP counseling assistance      Time In: 5409 Time Out: 1415 Time Total: 60 Greater than 50%  of this time was spent counseling and coordinating care related to the above assessment and plan.  Signed by:  Ihor Dow, FNP-C Palliative Medicine Team  Phone: [phone] Fax: [phone]   Please contact Palliative Medicine Team phone at [phone] for questions and concerns.  For individual provider: See Shea Evans

## 2018-03-14 NOTE — Progress Notes (Signed)
Progress Note  Patient Name: Mario Powell Date of Encounter: 03/14/2018  Primary Cardiologist:   No primary care provider on file.   Subjective   Feels much better.  No chest pain.  Breathing OK.    Inpatient Medications    Scheduled Meds: . sodium chloride   Intravenous Once  . ezetimibe  10 mg Oral Daily  . feeding supplement  1 Container Oral TID BM  . feeding supplement (PRO-STAT SUGAR FREE 64)  30 mL Oral TID BM  . furosemide  20 mg Intravenous Daily  . levofloxacin  750 mg Oral Q48H  . multivitamin with minerals  1 tablet Oral Daily  . pantoprazole (PROTONIX) IV  40 mg Intravenous Q12H  . rosuvastatin  40 mg Oral Daily  . spironolactone  25 mg Oral Daily   Continuous Infusions:  PRN Meds: acetaminophen, polyethylene glycol, polyvinyl alcohol   Vital Signs    Vitals:   03/13/18 1745 03/13/18 2040 03/14/18 0350 03/14/18 1059  BP: 119/72 105/66 96/61 (!) 97/55  Pulse: 75 79 73 77  Resp: 20 20 18 19   Temp: 99.1 F (37.3 C) 97.7 F (36.5 C) 97.8 F (36.6 C) 97.7 F (36.5 C)  TempSrc: Oral Oral Oral Oral  SpO2: 99% 99% 98% 100%  Weight:   78.1 kg   Height:        Intake/Output Summary (Last 24 hours) at 03/14/2018 1107 Last data filed at 03/14/2018 0500 Gross per 24 hour  Intake 2067 ml  Output 2575 ml  Net -508 ml   Filed Weights   03/12/18 0230 03/13/18 0610 03/14/18 0350  Weight: 83.5 kg 77.8 kg 78.1 kg    Telemetry    NSR - Personally Reviewed  ECG    NA - Personally Reviewed  Physical Exam   GEN: No acute distress.   Neck: No  JVD Cardiac: RRR, no murmurs, rubs, or gallops.  Normal mechanical S1 Respiratory: Clear  to auscultation bilaterally. GI: Soft, nontender, non-distended  MS:   Moderate ankle edema; No deformity. Neuro:  Nonfocal  Psych: Normal affect   Labs    Chemistry Recent Labs  Lab 03/10/18 1408 03/11/18 0607 03/11/18 1621 03/12/18 0346 03/13/18 0523 03/14/18 0414  NA 130* 129*  --  131* 130* 126*  K  5.3* 5.4*  --  4.9 4.5 4.8  CL 98 96*  --  97* 96* 94*  CO2 21* 24  --  24 27 25   GLUCOSE 150* 128*  --  129* 117* 108*  BUN 70* 83*  --  93* 96* 83*  CREATININE 3.42* 3.44*  --  3.59* 3.30* 2.73*  CALCIUM 8.8* 8.3*  --  8.4* 7.9* 7.9*  PROT 5.4* 4.2* 4.8* 4.7*  --   --   ALBUMIN 2.2* 1.8* 2.1* 2.1*  --   --   AST 35 22  --  26  --   --   ALT 29 21  --  22  --   --   ALKPHOS 61 45  --  54  --   --   BILITOT 0.9 0.5  --  0.5  --   --   GFRNONAA 17* 17*  --  16* 17* 22*  GFRAA 19* 19*  --  18* 20* 25*  ANIONGAP 11 9  --  10 7 7      Hematology Recent Labs  Lab 03/13/18 0523 03/13/18 1813 03/14/18 0414  WBC 4.5 4.8 4.8  RBC 2.23* 3.06* 3.01*  HGB 6.9* 9.4*  9.3*  HCT 21.2* 28.6* 28.0*  MCV 95.1 93.5 93.0  MCH 30.9 30.7 30.9  MCHC 32.5 32.9 33.2  RDW 17.3* 17.7* 17.7*  PLT 200 207 204    Cardiac Enzymes Recent Labs  Lab 03/10/18 1408 03/10/18 2003 03/11/18 0157 03/11/18 0607  TROPONINI <0.03 <0.03 <0.03 <0.03   No results for input(s): TROPIPOC in the last 168 hours.   BNP Recent Labs  Lab 03/10/18 1408  BNP 52.8     DDimer No results for input(s): DDIMER in the last 168 hours.   Radiology    US Paracentesis  Result Date: 03/12/2018 INDICATION: Abdominal distention and discomfort. Ascites. Request for diagnostic and therapeutic paracentesis. EXAM: ULTRASOUND GUIDED RIGHT LOWER QUADRANT PARACENTESIS MEDICATIONS: None. COMPLICATIONS: None immediate. PROCEDURE: Informed written consent was obtained from the patient after a discussion of the risks, benefits and alternatives to treatment. A timeout was performed prior to the initiation of the procedure. Initial ultrasound scanning demonstrates a large amount of ascites within the right lower abdominal quadrant. The right lower abdomen was prepped and draped in the usual sterile fashion. 1% lidocaine was used for local anesthesia. Following this, a 19 gauge, 7-cm, Yueh catheter was introduced. An ultrasound image was  saved for documentation purposes. The paracentesis was performed. The catheter was removed and a dressing was applied. The patient tolerated the procedure well without immediate post procedural complication. FINDINGS: A total of approximately 9 L of chylous yellow fluid was removed. Samples were sent to the laboratory as requested by the clinical team. IMPRESSION: Successful ultrasound-guided paracentesis yielding 9 liters of peritoneal fluid. Read by: Ascencion Dike PA-C Electronically Signed   By: Markus Daft M.D.   On: 03/12/2018 12:00    Cardiac Studies   Echo  - Left ventricle: The cavity size was normal. Wall thickness was   increased in a pattern of mild LVH. Systolic function was normal.   The estimated ejection fraction was in the range of 60% to 65%.   Wall motion was normal; there were no regional wall motion   abnormalities. Doppler parameters are consistent with abnormal   left ventricular relaxation (grade 1 diastolic dysfunction). LV   filling pressure is elevated. - Mitral valve: Bioprosthetic valve-mildly thickened leaflets. Mild   stenosis. There was trivial regurgitation. Valve area by   continuity equation (using LVOT flow): 1.47 cm^2. - Left atrium: The atrium was normal in size. - Right ventricle: The cavity size was mildly dilated. Systolic   function was normal. - Inferior vena cava: The vessel was normal in size. The   respirophasic diameter changes were in the normal range (>= 50%),   consistent with normal central venous pressure. - Pericardium, extracardiac: There was a right pleural effusion.   There was a left pleural effusion. Ascites is noted in the   subcostal views.   Patient Profile     72 y.o. male withCAD s/p CABG, mechanical mitral valve,prior history of endocarditis, paroxysmal atrial fibrillation, hypertension, chronic left greater than right lower extremity edema,recurrent chylous ascites,coumadin forchronic anticoagulation, prostatecancer  s/p treatment,gastric neuroendocrine tumor, recurrent ascitics and CKD stage IV,admitted with acute GI bleed and currentlyoff warfarin.   Assessment & Plan    ANASARCA/ASCITES:  Chylous ascites.   While there could be some component of diastolic dysfunction and less likely a constrictive picture this is not high on the differential list and so at this point right heart cath has not been suggested.     MECHANICAL MVR:   Seems to have normal  mechanical mitral valve function and systolic EF.  INR is therapeutic.  Currently not on heparin.  Need to watch daily INRs and weight risk benefit of restarting heparin.       PAF:  NSR    CAD:  No active symptoms.  Medical management.    CKD IV:  Creat is improved.  Continue to follow.    GI BLEED:   GI is following for timing of endoscopy.      For questions or updates, please contact [facility] Please consult [email or web address] for contact info under Cardiology/STEMI.   Signed, Minus Breeding, MD  03/14/2018, 11:07 AM

## 2018-03-14 NOTE — Care Management Note (Signed)
Case Management Note  Patient Details  Name: GUNNISON CHAHAL MRN: [id number] Date of Birth: [birth year]  Subjective/Objective:     SOB              Action/Plan: Noted pt has been admitted 3 times in 6 months; lives at home; Primary Care Provider: Wilber Oliphant, MD; has private insurance with Medicare; pt is for possible Inpt Rehab; CM will continue to follow for progression of care.  Expected Discharge Date:    possibly 03/15/2018             Expected Discharge Plan:  [facility]  Discharge planning Services  CM Consult    Status of Service:  In process, will continue to follow  Sherrilyn Rist [phone] 03/14/2018, 10:11 AM

## 2018-03-14 NOTE — H&P (View-Only) (Signed)
Subjective: The patient was seen and examined at bedside. Has not had a bowel movement in 3 days.  Objective: Vital signs in last 24 hours: Temp:  [97.3 F (36.3 C)-99.1 F (37.3 C)] 97.7 F (36.5 C) (12/30 1059) Pulse Rate:  [73-84] 77 (12/30 1059) Resp:  [18-20] 19 (12/30 1059) BP: (96-119)/(55-72) 97/55 (12/30 1059) SpO2:  [98 %-100 %] 100 % (12/30 1059) Weight:  [78.1 kg] 78.1 kg (12/30 0350) Weight change: 0.3 kg Last BM Date: 03/13/18  TT:SVXB pallor GENERAL:not in distress ABDOMEN:(status post 9 L paracentesis on 03/12/18), nontender EXTREMITIES:no deformity  Lab Results: Results for orders placed or performed during the hospital encounter of 03/10/18 (from the past 48 hour(s))  CBC     Status: Abnormal   Collection Time: 03/12/18  2:33 PM  Result Value Ref Range   WBC 3.9 (L) 4.0 - 10.5 K/uL   RBC 2.51 (L) 4.22 - 5.81 MIL/uL   Hemoglobin 7.7 (L) 13.0 - 17.0 g/dL   HCT 23.5 (L) 39.0 - 52.0 %   MCV 93.6 80.0 - 100.0 fL   MCH 30.7 26.0 - 34.0 pg   MCHC 32.8 30.0 - 36.0 g/dL   RDW 17.5 (H) 11.5 - 15.5 %   Platelets 181 150 - 400 K/uL   nRBC 0.0 0.0 - 0.2 %    Comment: Performed at [facility], [address], [place], [place] [address]  Heparin level (unfractionated)     Status: None   Collection Time: 03/12/18  8:49 PM  Result Value Ref Range   Heparin Unfractionated 0.42 0.30 - 0.70 IU/mL    Comment: (NOTE) If heparin results are below expected values, and patient dosage has  been confirmed, suggest follow up testing of antithrombin III levels. Performed at [facility], [place] [address], [place], [place] [address]   Protime-INR     Status: Abnormal   Collection Time: 03/13/18  5:23 AM  Result Value Ref Range   Prothrombin Time 33.1 (H) 11.4 - 15.2 seconds   INR 3.31     Comment: Performed at [facility] [address], [place] [address]  CBC     Status: Abnormal   Collection Time: 03/13/18  5:23 AM  Result Value Ref Range   WBC 4.5 4.0 - 10.5 K/uL   RBC 2.23 (L) 4.22 - 5.81 MIL/uL   Hemoglobin 6.9 (LL) 13.0 - 17.0 g/dL    Comment: REPEATED TO VERIFY THIS CRITICAL RESULT HAS VERIFIED AND BEEN CALLED TO R DUNN RN BY TIFFANY SHORT ON 12 29 2019 AT 0712, AND HAS BEEN READ BACK.     HCT 21.2 (L) 39.0 - 52.0 %   MCV 95.1 80.0 - 100.0 fL   MCH 30.9 26.0 - 34.0 pg   MCHC 32.5 30.0 - 36.0 g/dL   RDW 17.3 (H) 11.5 - 15.5 %   Platelets 200 150 - 400 K/uL   nRBC 0.0 0.0 - 0.2 %    Comment: Performed at [facility] [address] [place], [place] [address]  Basic metabolic panel     Status: Abnormal   Collection Time: 03/13/18  5:23 AM  Result Value Ref Range   Sodium 130 (L) 135 - 145 mmol/L   Potassium 4.5 3.5 - 5.1 mmol/L   Chloride 96 (L) 98 - 111 mmol/L   CO2 27 22 - 32 mmol/L   Glucose, Bld 117 (H) 70 - 99 mg/dL   BUN 96 (H) 8 -  23 mg/dL   Creatinine, Ser 3.30 (H) 0.61 - 1.24 mg/dL   Calcium 7.9 (L) 8.9 - 10.3 mg/dL   GFR calc non Af Amer 17 (L) >60 mL/min   GFR calc Af Amer 20 (L) >60 mL/min   Anion gap 7 5 - 15    Comment: Performed at [facility] [address], [place], [place] [address]  Heparin level (unfractionated)     Status: None   Collection Time: 03/13/18  5:23 AM  Result Value Ref Range   Heparin Unfractionated 0.52 0.30 - 0.70 IU/mL    Comment: (NOTE) If heparin results are below expected values, and patient dosage has  been confirmed, suggest follow up testing of antithrombin III levels. Performed at [facility], [place] [address], [place], [place] [address]   Prepare RBC     Status: None   Collection Time: 03/13/18  7:38 AM  Result Value Ref Range   Order Confirmation      ORDER PROCESSED BY BLOOD BANK Performed at [facility], [place] [address], [place], [place] [address]   CBC     Status: Abnormal   Collection Time: 03/13/18  6:13 PM  Result Value Ref Range   WBC 4.8 4.0 - 10.5 K/uL   RBC 3.06 (L) 4.22 - 5.81 MIL/uL   Hemoglobin 9.4 (L) 13.0 - 17.0  g/dL    Comment: REPEATED TO VERIFY POST TRANSFUSION SPECIMEN    HCT 28.6 (L) 39.0 - 52.0 %   MCV 93.5 80.0 - 100.0 fL   MCH 30.7 26.0 - 34.0 pg   MCHC 32.9 30.0 - 36.0 g/dL   RDW 17.7 (H) 11.5 - 15.5 %   Platelets 207 150 - 400 K/uL   nRBC 0.0 0.0 - 0.2 %    Comment: Performed at [facility], [place] [address], [place], [place] [address]  Protime-INR     Status: Abnormal   Collection Time: 03/14/18  4:14 AM  Result Value Ref Range   Prothrombin Time 26.9 (H) 11.4 - 15.2 seconds   INR 2.53     Comment: Performed at [facility] [address], [place], [place] [address]  CBC with Differential/Platelet     Status: Abnormal   Collection Time: 03/14/18  4:14 AM  Result Value Ref Range   WBC 4.8 4.0 - 10.5 K/uL   RBC 3.01 (L) 4.22 - 5.81 MIL/uL   Hemoglobin 9.3 (L) 13.0 - 17.0 g/dL   HCT 28.0 (L) 39.0 - 52.0 %   MCV 93.0 80.0 - 100.0 fL   MCH 30.9 26.0 - 34.0 pg   MCHC 33.2 30.0 - 36.0 g/dL   RDW 17.7 (H) 11.5 - 15.5 %   Platelets 204 150 - 400 K/uL   nRBC 0.0 0.0 - 0.2 %   Neutrophils Relative % 78 %   Neutro Abs 3.7 1.7 - 7.7 K/uL   Lymphocytes Relative 5 %   Lymphs Abs 0.3 (L) 0.7 - 4.0 K/uL   Monocytes Relative 16 %   Monocytes Absolute 0.8 0.1 - 1.0 K/uL   Eosinophils Relative 1 %   Eosinophils Absolute 0.0 0.0 - 0.5 K/uL   Basophils Relative 0 %   Basophils Absolute 0.0 0.0 - 0.1 K/uL   Immature Granulocytes 0 %   Abs Immature Granulocytes 0.02 0.00 - 0.07 K/uL    Comment: Performed at [facility], [place] [address], [place], [place] [address]  Basic metabolic panel  Status: Abnormal   Collection Time: 03/14/18  4:14 AM  Result Value Ref Range   Sodium 126 (L) 135 - 145 mmol/L   Potassium 4.8 3.5 - 5.1 mmol/L   Chloride 94 (L) 98 - 111 mmol/L   CO2 25 22 - 32 mmol/L   Glucose, Bld 108 (H) 70 - 99 mg/dL   BUN 83 (H) 8 - 23 mg/dL   Creatinine, Ser 2.73 (H) 0.61 - 1.24 mg/dL   Calcium 7.9 (L) 8.9 - 10.3 mg/dL   GFR calc non Af Amer 22 (L) >60  mL/min   GFR calc Af Amer 25 (L) >60 mL/min   Anion gap 7 5 - 15    Comment: Performed at [facility] [address] [place], [place] [address]    Studies/Results: No results found.  Medications: I have reviewed the patient's current medications.  Assessment: Melena likely related to post-polypectomy bleed after EGD on 03/04/18, while patient was on warfarin for mechanical mitral valve,history of endocarditis.  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 if INR is less than 2. If bleeding site is identified, plan on Endo Clip placement to decrease risk of bleeding on resumption of anticoagulation.  Continue Protonix 40 mg every 12 hours.  Patient on Lasix 20 mg IV daily and spironolactone 25 daily for chylous ascites and peripheral edema.  Ronnette Juniper 03/14/2018, 12:14 PM   Pager [phone] If no answer or after 5 PM call [phone]

## 2018-03-15 ENCOUNTER — Inpatient Hospital Stay (HOSPITAL_COMMUNITY): Payer: Medicare Other | Admitting: Anesthesiology

## 2018-03-15 ENCOUNTER — Encounter (HOSPITAL_COMMUNITY): Payer: Self-pay | Admitting: *Deleted

## 2018-03-15 ENCOUNTER — Encounter (HOSPITAL_COMMUNITY)
Admission: EM | Disposition: A | Discharge status: Home Health | Payer: Self-pay | Source: Home / Self Care | Attending: Family Medicine

## 2018-03-15 DIAGNOSIS — D62 Acute posthemorrhagic anemia: Secondary | ICD-10-CM

## 2018-03-15 DIAGNOSIS — Z515 Encounter for palliative care: Secondary | ICD-10-CM

## 2018-03-15 HISTORY — PX: ESOPHAGOGASTRODUODENOSCOPY (EGD) WITH PROPOFOL: SHX5813

## 2018-03-15 LAB — CBC
HCT: 27.8 % — ABNORMAL LOW (ref 39.0–52.0)
HCT: 28.2 % — ABNORMAL LOW (ref 39.0–52.0)
Hemoglobin: 9.7 g/dL — ABNORMAL LOW (ref 13.0–17.0)
Hemoglobin: 9.7 g/dL — ABNORMAL LOW (ref 13.0–17.0)
MCH: 31.9 pg (ref 26.0–34.0)
MCH: 31.9 pg (ref 26.0–34.0)
MCHC: 34.9 g/dL (ref 30.0–36.0)
MCHC: 34.4 g/dL (ref 30.0–36.0)
MCV: 91.4 fL (ref 80.0–100.0)
MCV: 92.8 fL (ref 80.0–100.0)
PLATELETS: 240 10*3/uL (ref 150–400)
Platelets: 268 10*3/uL (ref 150–400)
RBC: 3.04 MIL/uL — ABNORMAL LOW (ref 4.22–5.81)
RBC: 3.04 MIL/uL — AB (ref 4.22–5.81)
RDW: 17.6 % — ABNORMAL HIGH (ref 11.5–15.5)
RDW: 17.7 % — ABNORMAL HIGH (ref 11.5–15.5)
WBC: 5.3 10*3/uL (ref 4.0–10.5)
WBC: 5.9 10*3/uL (ref 4.0–10.5)
nRBC: 0 % (ref 0.0–0.2)
nRBC: 0 % (ref 0.0–0.2)

## 2018-03-15 LAB — BASIC METABOLIC PANEL
Anion gap: 7 (ref 5–15)
Anion gap: 7 (ref 5–15)
BUN: 76 mg/dL — ABNORMAL HIGH (ref 8–23)
BUN: 78 mg/dL — ABNORMAL HIGH (ref 8–23)
CO2: 24 mmol/L (ref 22–32)
CO2: 24 mmol/L (ref 22–32)
Calcium: 7.6 mg/dL — ABNORMAL LOW (ref 8.9–10.3)
Calcium: 7.4 mg/dL — ABNORMAL LOW (ref 8.9–10.3)
Chloride: 91 mmol/L — ABNORMAL LOW (ref 98–111)
Chloride: 91 mmol/L — ABNORMAL LOW (ref 98–111)
Creatinine, Ser: 2.34 mg/dL — ABNORMAL HIGH (ref 0.61–1.24)
Creatinine, Ser: 2.45 mg/dL — ABNORMAL HIGH (ref 0.61–1.24)
GFR calc Af Amer: 31 mL/min — ABNORMAL LOW (ref >60)
GFR calc Af Amer: 29 mL/min — ABNORMAL LOW (ref >60)
GFR calc non Af Amer: 25 mL/min — ABNORMAL LOW (ref >60)
GFR, EST NON AFRICAN AMERICAN: 26 mL/min — AB (ref >60)
GLUCOSE: 122 mg/dL — AB (ref 70–99)
Glucose, Bld: 106 mg/dL — ABNORMAL HIGH (ref 70–99)
Potassium: 4.3 mmol/L (ref 3.5–5.1)
Potassium: 4.2 mmol/L (ref 3.5–5.1)
Sodium: 122 mmol/L — ABNORMAL LOW (ref 135–145)
Sodium: 122 mmol/L — ABNORMAL LOW (ref 135–145)

## 2018-03-15 LAB — HEPARIN LEVEL (UNFRACTIONATED): Heparin Unfractionated: 0.34 IU/mL (ref 0.30–0.70)

## 2018-03-15 LAB — LIPASE, FLUID: Lipase-Fluid: 31 U/L

## 2018-03-15 LAB — PROTIME-INR
INR: 1.81
PROTHROMBIN TIME: 20.7 s — AB (ref 11.4–15.2)

## 2018-03-15 LAB — SODIUM, URINE, RANDOM: Sodium, Ur: <10 mmol/L

## 2018-03-15 LAB — OSMOLALITY, URINE: Osmolality, Ur: 431 mOsm/kg (ref 300–900)

## 2018-03-15 SURGERY — ESOPHAGOGASTRODUODENOSCOPY (EGD) WITH PROPOFOL
Anesthesia: Monitor Anesthesia Care

## 2018-03-15 MED ORDER — HEPARIN (PORCINE) 25000 UT/250ML-% IV SOLN
1000.0000 [IU]/h | INTRAVENOUS | Status: DC
  Administered 2018-03-15 – 2018-03-17 (×3): 1000 [IU]/h via INTRAVENOUS
  Filled 2018-03-15 (×3): qty 250

## 2018-03-15 MED ORDER — WARFARIN SODIUM 1 MG PO TABS
1.0000 mg | ORAL_TABLET | Freq: Once | ORAL | Status: AC
  Administered 2018-03-15: 1 mg via ORAL
  Filled 2018-03-15: qty 1

## 2018-03-15 MED ORDER — PHENYLEPHRINE HCL 10 MG/ML IJ SOLN
INTRAMUSCULAR | Status: DC | PRN
  Administered 2018-03-15 (×3): 80 ug via INTRAVENOUS

## 2018-03-15 MED ORDER — PROPOFOL 500 MG/50ML IV EMUL
INTRAVENOUS | Status: DC | PRN
  Administered 2018-03-15: 50 ug/kg/min via INTRAVENOUS

## 2018-03-15 MED ORDER — SODIUM CHLORIDE 0.9 % IV SOLN: INTRAVENOUS | Status: DC

## 2018-03-15 MED ORDER — WARFARIN - PHARMACIST DOSING INPATIENT
Freq: Every day | Status: DC
  Administered 2018-03-17 – 2018-03-20 (×2)

## 2018-03-15 MED ORDER — PANTOPRAZOLE SODIUM 40 MG PO TBEC
40.0000 mg | DELAYED_RELEASE_TABLET | Freq: Two times a day (BID) | ORAL | Status: DC
  Administered 2018-03-15 – 2018-03-22 (×14): 40 mg via ORAL
  Filled 2018-03-15 (×10): qty 1
  Filled 2018-03-15 (×2): qty 2
  Filled 2018-03-15 (×2): qty 1

## 2018-03-15 MED ORDER — LIDOCAINE HCL (CARDIAC) PF 100 MG/5ML IV SOSY
PREFILLED_SYRINGE | INTRAVENOUS | Status: DC | PRN
  Administered 2018-03-15: 40 mg via INTRATRACHEAL

## 2018-03-15 SURGICAL SUPPLY — 15 items

## 2018-03-15 NOTE — Progress Notes (Signed)
Family Medicine Teaching Service Daily Progress Note Intern Pager: [phone]  Patient name: Mario Powell Medical record number: [id number] Date of birth: 1945-11-30 Age: 72 y.o. Gender: male  Primary Care Provider: Wilber Oliphant, MD Consultants: GI Code Status: Full code  Pt Overview and Major Events to Date:  Hospital Day 5 Admitted: 03/10/2018 12/28 - large volume paracentesis (9 liters) 12/31 - EGD  Assessment and Plan: Mario Powell is a 72 y.o. male presenting with increasing shortness of breath, ascites, darkened stools for the past few days, and found to have a possible LLL pneumonia on chest x-ray. PMH is significant for gastric neuroendocrine polyps, history of mitral valve replacement with mechanical valve on chronic anticoagulation, HTN, prostate cancer s/p treatment, paroxysmal atrial fibrillation, anasarca of unknown source, CKD, normocytic anemia, hyponatremia, and chronic left pleural effusion.   Shortness of breath, multifactorial 2/2 ascites, chronic pleural effusion, LL Pneumonia, Anemia from GI bleed Large volume paracentesis 12/28, 9 liters. PT/OT recommend CIR. CTAB this am. -spironolactone 25mg  - lasix 20mg  - Levaquin PO (12/26-1/1), started in the ED, for pneumonia coverage - Incentive spirometry - Consider thoracentesis for chronic left effusion - Oxygen therapy, wean as tolerated  Acute GI bleed.  Hgb 8.7>>6.0.>2 UpRBC>8.7>>6.9>2 units RBCs >9.7(12/31).  EGD 12/31 with nonbleeding gastric ulcers, clips placed during procedure.  GI recommends outpatient follow-up with Dr. [name] for history of gastric carcinoid.  Plan to resume anticoagulation with heparin bridge to Coumadin starting today per cards recommendation.  Goal INR 2.5-3.5 for mechanical heart valve.   - Resume regular diet per GI -Start heparin bridge to Coumadin - Protonix 40 mg twice daily - monitor Hgb - Monitor melena   Anasarca, associated with ascites: Acute on chronic. Improved with  9L removed with paracentesis 12/28. With 2+ pitting edema to LE to shins bilaterally. SAAG: 0.7 (unlikely to be from portal vein hypertension), LD 120, triglycerides 1,113, milky, WBCs 342. - lasix 20mg  IV daily, held this am due to lower BP, consider transitioning to PO once achieving desired diuresis - spironolactone 25mg  daily - Continued work-up with GI and PCP for exact etiology, may require lymphoscintigraphy or similar MR imaging for evaluation of obstrucion  AKI on CKD:  Cr 3.42>>2.34 12/31, BL appears to be around 2.21.  Patient was recently started on spironolactone and furosemide on 11/14 by his PCP.  - spironolactone - lasix daily - Avoid nephrotoxic medications as possible - Monitor fluid status closely - Strict I and O  Hyponatremia: Chronic.  Na 130>>122(12/31), baseline appears around 131-135.  Difficult to assess whether this is related to fluid overload versus intravascularly depleted in setting of diuresis. -Monitor CMP - obtain urine sodium and osmols  Gastric neuroendocrine tumor: Acute, recurrent.  Previously diagnosed and biopsied with small margins via EGD in September.  However recently had an EGD on 12/20 with surgical pathology showing additional well differentiated neuroendocrine tumor extending to the edge of biopsy.  Follows with [facility] and Dr. Annamaria Boots with hematology/oncology. - To follow-up with Dr. [name] outpt  History of mitral valve replacement with mechanical valve: Chronic, stable. Chronically on anticoagulation with warfarin.    EGD completed today, will start heparin bridge to Coumadin with INR goal 2.5-3.5 per cardiology recommendation.   -Heparin and Coumadin per pharmacy  Severe protein-calorie malnutrition: Chronic.  Albumin 1.8>2.1.  Family states he has a decreased appetite. - Nutrition consult  Paroxysmal atrial fibrillation: Chronic, stable. Currently in sinus rhythm, HR 80-100.  On chronic warfarin therapy as above, not on  any  rate controlling regimen. - Monitor on telemetry  History of prostate cancer: Stable.  Patient reports this was 2 decades ago, s/p treatment.  No current concerns. -Monitor outpatient  Chronic hypertension: Stable. 86/48 this a.m. On diuretic therapy with spironolactone and torsemide has been controlling his pressure. - Holding home diuretic therapy in the setting of AKI, patient normotensive  Vitamin B12 deficiency: Chronic, stable.  Follows with Dr. Annamaria Boots outpatient for injections monthly, last injection on 11/20. - Consider vitamin B12 injection vs deferment to outpatient  Normocytic anemia: Chronic.  Previously megaloblastic, receiving B12 injections monthly.  Hemoglobin improved this morning 9.7, baseline around 9-10.  No bleeding noted on EGD, no overt signs of bleeding on exam.    - Monitor CBC  FEN/GI:  Regular diet Prophylaxis:  Heparin bridge to warfarin  Disposition: DC to CIR following resolution of GI bleed.  Subjective:  Patient doing well following EGD procedure.  Eating breakfast without difficulty, no complaints.  Objective: Temp:  [97.4 F (36.3 C)-97.7 F (36.5 C)] 97.7 F (36.5 C) (12/31 0911) Pulse Rate:  [74-81] 78 (12/31 0930) Resp:  [13-18] 13 (12/31 0930) BP: (84-116)/(48-59) 84/55 (12/31 1039) SpO2:  [99 %-100 %] 100 % (12/31 0930) Weight:  [78.7 kg] 78.7 kg (12/31 0501) Intake/Output 12/30 0701 - 12/31 0700 In: 120 [P.O.:120] Out: 1400 [Urine:1400]  Physical Exam:  General: Pleasant, elderly male sitting in bed eating breakfast, in no apparent distress Cardio: RRR, mechanical click Pulm: Clear to auscultation bilaterally, no crackles, wheezing, or diminished breath sounds. Normal respiratory effort Abdomen: NTND, +BS Extremities:  2+ pitting edema to shins  Laboratory: Recent Labs  Lab 03/13/18 1813 03/14/18 0414 03/15/18 0448  WBC 4.8 4.8 5.3  HGB 9.4* 9.3* 9.7*  HCT 28.6* 28.0* 27.8*  PLT 207 204 240   Recent Labs  Lab  03/10/18 1408 03/11/18 0607 03/11/18 1621 03/12/18 0346 03/13/18 0523 03/14/18 0414 03/15/18 0623  NA 130* 129*  --  131* 130* 126* 122*  K 5.3* 5.4*  --  4.9 4.5 4.8 4.3  CL 98 96*  --  97* 96* 94* 91*  CO2 21* 24  --  24 27 25 24   BUN 70* 83*  --  93* 96* 83* 76*  CREATININE 3.42* 3.44*  --  3.59* 3.30* 2.73* 2.34*  CALCIUM 8.8* 8.3*  --  8.4* 7.9* 7.9* 7.6*  PROT 5.4* 4.2* 4.8* 4.7*  --   --   --   BILITOT 0.9 0.5  --  0.5  --   --   --   ALKPHOS 61 45  --  54  --   --   --   ALT 29 21  --  22  --   --   --   AST 35 22  --  26  --   --   --   GLUCOSE 150* 128*  --  129* 117* 108* 106*    Imaging/Diagnostic Tests: EGD 12/31 - nonbleeding gastric ulcers, no acute bleed present  Rory Percy, DO 03/15/2018, 12:33 PM PGY-2, [facility] Intern pager: [phone], text pages welcome

## 2018-03-15 NOTE — Op Note (Signed)
[facility] Patient Name: Mario Powell Procedure Date : 03/15/2018 MRN: [id number] Attending MD: Ronnette Juniper , MD Date of Birth: 1946/01/08 CSN: [id number] Age: 72 Admit Type: Inpatient Procedure:                Upper GI endoscopy Indications:              Melena, post gastric carcinoid removal on 03/04/18,                            on coumadin for mechanical mitral valve Providers:                Ronnette Juniper, MD, Carlyn Reichert, RN, Janeece Agee,                            Technician, Cletis Athens, Technician, Theodoro Grist,                            CRNA Referring MD:              Medicines:                Monitored Anesthesia Care Complications:            No immediate complications. Estimated blood loss:                            None. Estimated Blood Loss:     Estimated blood loss: none. Procedure:                Pre-Anesthesia Assessment:                           - Prior to the procedure, a History and Physical                            was performed, and patient medications and                            allergies were reviewed. [name] tolerance of                            previous anesthesia was also reviewed. The risks                            and benefits of the procedure and the sedation                            options and risks were discussed with the patient.                            All questions were answered, and informed consent                            was obtained. Prior Anticoagulants: The patient has                            taken  Coumadin (warfarin), last dose was 5 days                            prior to procedure. ASA Grade Assessment: III - A                            patient with severe systemic disease. After                            reviewing the risks and benefits, the patient was                            deemed in satisfactory condition to undergo the                            procedure.  After obtaining informed consent, the endoscope was                            passed under direct vision. Throughout the                            procedure, the patient's blood pressure, pulse, and                            oxygen saturations were monitored continuously. The                            GIF-H190 ([id number]) Olympus Adult EGD was introduced                            through the mouth, and advanced to the second part                            of duodenum. The upper GI endoscopy was                            accomplished without difficulty. The patient                            tolerated the procedure well. Scope In: Scope Out: Findings:      The examined esophagus was normal.      The Z-line was regular and was found 40 cm from the incisors.      Three non-bleeding cratered gastric ulcers with a clean ulcer base       (Forrest Class III) were found in the cardia (#1) and on the lesser       curvature of the stomach(#2). The largest lesion was 7 mm in largest       dimension. These were likely areas of polypectomy. To close the mucosal       defects, three hemostatic clips were successfully placed, 1 on each       site. There was no bleeding at the end of the procedure.      Patchy moderate mucosal changes characterized by nodularity were found  in the cardia and in the gastric fundus. Biopsies were not performed as       patient recently had melena requiring transfusions.      The examined duodenum was normal. Impression:               - Normal esophagus.                           - Z-line regular, 40 cm from the incisors.                           - Non-bleeding gastric ulcers with a clean ulcer                            base (Forrest Class III). Clips were placed.                           - Nodular mucosa in the cardia and gastric fundus.                           - Normal examined duodenum.                           - No specimens collected. Recommendation:            - Resume regular diet.                           - Resume Coumadin (warfarin) at prior dose today.                           - Outpatient f/u with Dr.Brahmbhatt for history of                            gastric carcinoid. Procedure Code(s):        --- Professional ---                           [phone], Esophagogastroduodenoscopy, flexible,                            transoral; diagnostic, including collection of                            specimen(s) by brushing or washing, when performed                            (separate procedure) Diagnosis Code(s):        --- Professional ---                           K25.9, Gastric ulcer, unspecified as acute or                            chronic, without hemorrhage or perforation  K31.89, Other diseases of stomach and duodenum                           K92.1, Melena (includes Hematochezia) CPT copyright 2018 American Medical Association. All rights reserved. The codes documented in this report are preliminary and upon coder review may  be revised to meet current compliance requirements. Ronnette Juniper, MD 03/15/2018 9:13:17 AM This report has been signed electronically. Number of Addenda: 0

## 2018-03-15 NOTE — Progress Notes (Signed)
PT Cancellation Note  Patient Details Name: OLIVIER FRAYRE MRN: [id number] DOB: 06/02/1945   Cancelled Treatment:    Reason Eval/Treat Not Completed: Patient at procedure or test/unavailable   Lani Havlik B Salvatore Shear 03/15/2018, 8:41 AM  Elwyn Reach, PT Acute Rehabilitation Services Pager: [phone] Office: [phone]

## 2018-03-15 NOTE — Progress Notes (Signed)
FPTS Interim Progress Note  S:Paged by nurse with BP 76/49, MAP 58.  Patient seen and examined at bedside.  Patient sitting up in chair, denying current complaint of dizziness.  States, "I feel fine.  I just have a little bit of a headache while you're yelling."  Also complains about condom catheter and states that it "bothers" him.  No other complaints.  His daughter notes that he has been apprehensive about eating as he had a choking episode early in his hospitalization.  She is requesting that someone be with him in the AM when he has breakfast so that he will eat.  Per daughter, patient is at his mental baseline and has not been complaining of headaches or dizziness.  O: BP (!) 76/49 (BP Location: Left Arm)   Pulse 81   Temp (!) 97.3 F (36.3 C) (Oral)   Resp 18   Ht 5\' 11"  (1.803 m)   Wt 78.7 kg   SpO2 99%   BMI 24.21 kg/m   Physical Exam: General: 72 y.o. male in NAD, sitting up in chair Cardio: RRR Lungs: CTAB, no wheezing, no rhonchi, no crackles, no increased work of breathing Abdomen: Soft, non-tender to palpation Skin: warm and dry Extremities: 2+ pitting edema BLE to shins  BMP Latest Ref Rng & Units 03/15/2018 03/15/2018 03/14/2018  Glucose 70 - 99 mg/dL 122(H) 106(H) 108(H)  BUN 8 - 23 mg/dL 78(H) 76(H) 83(H)  Creatinine 0.61 - 1.24 mg/dL 2.45(H) 2.34(H) 2.73(H)  BUN/Creat Ratio 10 - 24 - - -  Sodium 135 - 145 mmol/L 122(L) 122(L) 126(L)  Potassium 3.5 - 5.1 mmol/L 4.2 4.3 4.8  Chloride 98 - 111 mmol/L 91(L) 91(L) 94(L)  CO2 22 - 32 mmol/L 24 24 25   Calcium 8.9 - 10.3 mg/dL 7.4(L) 7.6(L) 7.9(L)   CBC Latest Ref Rng & Units 03/15/2018 03/15/2018 03/14/2018  WBC 4.0 - 10.5 K/uL 5.9 5.3 4.8  Hemoglobin 13.0 - 17.0 g/dL 9.7(L) 9.7(L) 9.3(L)  Hematocrit 39.0 - 52.0 % 28.2(L) 27.8(L) 28.0(L)  Platelets 150 - 400 K/uL 268 240 204     A/P: Hypotension Asymptomatic, will continue to monitor at this time Will be cautious with fluid resuscitation given history of fluid  overload Reassured as patient's Hgb is stable at 9.7 from this AM, therefore he is not likely bleeding Will have low threshold to call CCM should hypotension continue or worsen  Swallowing Difficulty SLP consult Nurse aide with him while eating  Hyponatremia Na 122 this AM, stable at recheck this PM, patient without symptoms of hyponatremia is as baseline mental status per daughter and without complaints Urine Osmolality >100 and urine sodium <10, will need to ensure sodium intake of >150 mEq.  Difficult to assess cause as is fluid overloaded and also being diuresed. Will hold Lasix at this time as next dose is scheduled for 12pm, defer to day team decision to continue, as unsure if loop diuretic is cause Order free water restriction of 1.2L Recheck BMP in AM   Jeydan Barner, Bernita Raisin, DO 03/15/2018, 9:09 PM PGY-1, [place] [facility] pager [phone]

## 2018-03-15 NOTE — Progress Notes (Addendum)
Progress Note  Patient Name: Mario Powell Date of Encounter: 03/15/2018  Primary Cardiologist:   No primary care provider on file.   Subjective   No chest pain.  No SOB.   Inpatient Medications    Scheduled Meds: . sodium chloride   Intravenous Once  . ezetimibe  10 mg Oral Daily  . feeding supplement  1 Container Oral TID BM  . feeding supplement (PRO-STAT SUGAR FREE 64)  30 mL Oral TID BM  . furosemide  20 mg Intravenous Daily  . levofloxacin  750 mg Oral Q48H  . multivitamin with minerals  1 tablet Oral Daily  . pantoprazole (PROTONIX) IV  40 mg Intravenous Q12H  . rosuvastatin  40 mg Oral Daily  . spironolactone  25 mg Oral Daily   Continuous Infusions:  PRN Meds: acetaminophen, polyethylene glycol, polyvinyl alcohol   Vital Signs    Vitals:   03/14/18 0350 03/14/18 1059 03/14/18 1925 03/15/18 0501  BP: 96/61 (!) 97/55 (!) 92/53 (!) 89/59  Pulse: 73 77 81 74  Resp: 18 19 18 18   Temp: 97.8 F (36.6 C) 97.7 F (36.5 C) (!) 97.5 F (36.4 C) (!) 97.4 F (36.3 C)  TempSrc: Oral Oral Oral Oral  SpO2: 98% 100% 100% 99%  Weight: 78.1 kg   78.7 kg  Height:        Intake/Output Summary (Last 24 hours) at 03/15/2018 0740 Last data filed at 03/15/2018 4132 Gross per 24 hour  Intake 120 ml  Output 1400 ml  Net -1280 ml   Filed Weights   03/13/18 0610 03/14/18 0350 03/15/18 0501  Weight: 77.8 kg 78.1 kg 78.7 kg    Telemetry    NSR - Personally Reviewed  ECG    NA - Personally Reviewed  Physical Exam   GEN: No  acute distress.   Neck: No  JVD Cardiac: RRR, no murmurs, rubs, or gallops.  Mechanical S1 Respiratory: Clear   to auscultation bilaterally. GI: Soft, nontender, non-distended, normal bowel sounds  MS:  Moderate edema; No deformity. Neuro:   Nonfocal  Psych: Oriented and appropriate   Labs    Chemistry Recent Labs  Lab 03/10/18 1408 03/11/18 0607 03/11/18 1621 03/12/18 0346 03/13/18 0523 03/14/18 0414 03/15/18 0623  NA  130* 129*  --  131* 130* 126* 122*  K 5.3* 5.4*  --  4.9 4.5 4.8 4.3  CL 98 96*  --  97* 96* 94* 91*  CO2 21* 24  --  24 27 25 24   GLUCOSE 150* 128*  --  129* 117* 108* 106*  BUN 70* 83*  --  93* 96* 83* 76*  CREATININE 3.42* 3.44*  --  3.59* 3.30* 2.73* 2.34*  CALCIUM 8.8* 8.3*  --  8.4* 7.9* 7.9* 7.6*  PROT 5.4* 4.2* 4.8* 4.7*  --   --   --   ALBUMIN 2.2* 1.8* 2.1* 2.1*  --   --   --   AST 35 22  --  26  --   --   --   ALT 29 21  --  22  --   --   --   ALKPHOS 61 45  --  54  --   --   --   BILITOT 0.9 0.5  --  0.5  --   --   --   GFRNONAA 17* 17*  --  16* 17* 22* 26*  GFRAA 19* 19*  --  18* 20* 25* 31*  ANIONGAP 11 9  --  10 7 7 7      Hematology Recent Labs  Lab 03/13/18 1813 03/14/18 0414 03/15/18 0448  WBC 4.8 4.8 5.3  RBC 3.06* 3.01* 3.04*  HGB 9.4* 9.3* 9.7*  HCT 28.6* 28.0* 27.8*  MCV 93.5 93.0 91.4  MCH 30.7 30.9 31.9  MCHC 32.9 33.2 34.9  RDW 17.7* 17.7* 17.6*  PLT 207 204 240    Cardiac Enzymes Recent Labs  Lab 03/10/18 1408 03/10/18 2003 03/11/18 0157 03/11/18 0607  TROPONINI <0.03 <0.03 <0.03 <0.03   No results for input(s): TROPIPOC in the last 168 hours.   BNP Recent Labs  Lab 03/10/18 1408  BNP 52.8     DDimer No results for input(s): DDIMER in the last 168 hours.   Radiology    No results found.  Cardiac Studies   Echo  - Left ventricle: The cavity size was normal. Wall thickness was   increased in a pattern of mild LVH. Systolic function was normal.   The estimated ejection fraction was in the range of 60% to 65%.   Wall motion was normal; there were no regional wall motion   abnormalities. Doppler parameters are consistent with abnormal   left ventricular relaxation (grade 1 diastolic dysfunction). LV   filling pressure is elevated. - Mitral valve: Bioprosthetic valve-mildly thickened leaflets. Mild   stenosis. There was trivial regurgitation. Valve area by   continuity equation (using LVOT flow): 1.47 cm^2. - Left atrium:  The atrium was normal in size. - Right ventricle: The cavity size was mildly dilated. Systolic   function was normal. - Inferior vena cava: The vessel was normal in size. The   respirophasic diameter changes were in the normal range (>= 50%),   consistent with normal central venous pressure. - Pericardium, extracardiac: There was a right pleural effusion.   There was a left pleural effusion. Ascites is noted in the   subcostal views.   Patient Profile     72 y.o. male withCAD s/p CABG, mechanical mitral valve,prior history of endocarditis, paroxysmal atrial fibrillation, hypertension, chronic left greater than right lower extremity edema,recurrent chylous ascites,coumadin forchronic anticoagulation, prostatecancer s/p treatment,gastric neuroendocrine tumor, recurrent ascitics and CKD stage IV,admitted with acute GI bleed and currentlyoff warfarin.   Assessment & Plan    ANASARCA/ASCITES:  Chylous ascites.   Plan per primary team.   MECHANICAL MVR:   Seems to have normal mechanical mitral valve function and systolic EF.  INR less than 2 so EGD should be planned for today.  He will need bridging heparin until INR back above 2.5.  Of note   PAF:  NSR  CAD:  No active ischema  CKD IV:  Creat continues to improve.  GI BLEED:   As above.   HYPONATREMIA:  Net negative fluid 24 hours was 1.28.  Free water restrict.  He has been drinking a lot of water and eating ice chips.     For questions or updates, please contact [place] Please consult [email or web address] for contact info under Cardiology/STEMI.   Signed, Minus Breeding, MD  03/15/2018, 7:40 AM

## 2018-03-15 NOTE — Progress Notes (Addendum)
[place] for heparin and warfarin Indication: heparin bridge while INR < 2.5  Allergies  Allergen Reactions  . Penicillins Other (See Comments)    DID THE REACTION INVOLVE: Swelling of the face/tongue/throat, SOB, or low BP? Unknown Sudden or severe rash/hives, skin peeling, or the inside of the mouth or nose? Unknown Did it require medical treatment? Yes When did it last happen?When pt was 72 years old Pt knows that his parents had to take him to the ER at the hospital and has not been retested with any PCN or cephalosporin that he is aware of.    Patient Measurements: Height: 5\' 11"  (180.3 cm) Weight: 173 lb 9.6 oz (78.7 kg) IBW/kg (Calculated) : 75.3 Heparin Dosing Weight: 83.2  Vital Signs: Temp: 97.7 F (36.5 C) (12/31 0911) Temp Source: Oral (12/31 0911) BP: 86/48 (12/31 0920) Pulse Rate: 74 (12/31 0911)  Labs: Recent Labs    03/12/18 2049 03/13/18 0523 03/13/18 1813 03/14/18 0414 03/15/18 0448 03/15/18 0623  HGB  --  6.9* 9.4* 9.3* 9.7*  --   HCT  --  21.2* 28.6* 28.0* 27.8*  --   PLT  --  200 207 204 240  --   LABPROT  --  33.1*  --  26.9* 20.7*  --   INR  --  3.31  --  2.53 1.81  --   HEPARINUNFRC 0.42 0.52  --   --   --   --   CREATININE  --  3.30*  --  2.73*  --  2.34*    Estimated Creatinine Clearance: 29.5 mL/min (A) (by C-G formula based on SCr of 2.34 mg/dL (H)).   Medical History: Past Medical History:  Diagnosis Date  . Anasarca 12/01/2017  . Atrial fibrillation ([place]) 07/12/2013   Perioperative, 2001   . CAD (coronary artery disease) of artery bypass graft 07/12/2013   Saphenous vein graft 2001   . Chronic anticoagulation 07/12/2013  . Essential hypertension 07/12/2013  . History of mitral valve replacement with mechanical valve 01/11/2013   Mechanical mitral valve 2001  Bacterial endocarditis as the cause   . HOH (hard of hearing)   . Prostate cancer ([place]) 07/12/2013    Medications:  Scheduled:  .  sodium chloride   Intravenous Once  . ezetimibe  10 mg Oral Daily  . feeding supplement  1 Container Oral TID BM  . feeding supplement (PRO-STAT SUGAR FREE 64)  30 mL Oral TID BM  . furosemide  20 mg Intravenous Daily  . levofloxacin  750 mg Oral Q48H  . multivitamin with minerals  1 tablet Oral Daily  . pantoprazole (PROTONIX) IV  40 mg Intravenous Q12H  . rosuvastatin  40 mg Oral Daily  . spironolactone  25 mg Oral Daily    Assessment: 72 yoM admitted 12/26 with complains of shortness of breath and dark stools. PTA patient is on warfarin for mechanical MVR and atrial fibrillation. Warfarin has been held since 12/27 (last dose 12/27 at 0135) since patient had a GI bleed requiring transfusion. Today patient's INR has continue to trend down from 2.53 to 1.81, so pharmacy has been consulted to initiate heparin bridge while patient is off warfarin.   Heparin level was previously therapeutic 1000 units/hr.   PTA warfarin dose: 1mg  daily except for 0.5mg  on M/W/F   Goal of Therapy:  Heparin level 0.3-0.7 units/ml INR 2.5 - 3.5 Monitor platelets by anticoagulation protocol: Yes   Plan:  Initiate heparin infusion at 1000 units/hr  Check heparin level in 8 hours Check daily heparin level and CBC Restart home warfarin - 1mg  po x 1 tonight  Monitor daily INR and watch closely for s/sx of bleeding  Thank you for allowing pharmacy to be a part of this patient's care.  Alanda Slim, PharmD, [facility] Clinical Pharmacist Please see AMION for all Pharmacists' Contact Phone Numbers 03/15/2018, 9:59 AM

## 2018-03-15 NOTE — Brief Op Note (Signed)
03/10/2018 - 03/15/2018  9:14 AM  PATIENT:  Thurman Coyer  72 y.o. male  PRE-OPERATIVE DIAGNOSIS:  melena  POST-OPERATIVE DIAGNOSIS:  Gastric ulcers post polypectomy 3 clips placed.   PROCEDURE:  Procedure(s): ESOPHAGOGASTRODUODENOSCOPY (EGD) WITH PROPOFOL (N/A) HEMOSTASIS CLIP PLACEMENT  SURGEON:  Surgeon(s) and Role:    Ronnette Juniper, MD - Primary  PHYSICIAN ASSISTANT:   ASSISTANTS: Kingsley Plan, RN, Cletis Athens, Tech  ANESTHESIA:   MAC  EBL:  Minimal  BLOOD ADMINISTERED:none  DRAINS: none   LOCAL MEDICATIONS USED:  NONE  SPECIMEN:  No Specimen  DISPOSITION OF SPECIMEN:  N/A  COUNTS:  YES  TOURNIQUET:  * No tourniquets in log *  DICTATION: .Dragon Dictation  PLAN OF CARE: Admit to inpatient   PATIENT DISPOSITION:  PACU - hemodynamically stable.   Delay start of Pharmacological VTE agent (>24hrs) due to surgical blood loss or risk of bleeding: no

## 2018-03-15 NOTE — Anesthesia Procedure Notes (Signed)
Procedure Name: MAC Date/Time: 03/15/2018 8:58 AM Performed by: Lavell Luster, CRNA Pre-anesthesia Checklist: Patient identified, Emergency Drugs available, Suction available, Patient being monitored and Timeout performed Patient Re-evaluated:Patient Re-evaluated prior to induction Oxygen Delivery Method: Nasal cannula Preoxygenation: Pre-oxygenation with 100% oxygen Induction Type: IV induction Placement Confirmation: breath sounds checked- equal and bilateral and positive ETCO2 Dental Injury: Teeth and Oropharynx as per pre-operative assessment

## 2018-03-15 NOTE — Progress Notes (Addendum)
Physical Therapy Treatment Patient Details Name: Mario Powell MRN: [id number] DOB: Dec 29, 1945 Today's Date: 03/15/2018    History of Present Illness Pt is a 72 y.o. M presenting with increasing SOB, ascites, darkened stools.  Imaging in ED reveals ascites with L pleural effusion and bibasilar atelectasis. Paracentesis 12/28 removed 9L of fluid. PMH is significant for a.fib on Coumadin, CAD, HTN, MV replacement, anemia, prostate cancer.    PT Comments    Pt in good spirits and willing to mobilize after EGD today. Pt with improved mobility and transfers with limited gait performed. Pt with generalized weakness and fatigue with daughter in college who is to return to school in a week and pt with a flight of stairs at home. Pt would benefit from CIR to achieve mod I level for return home alone unless able to meet goals acutely. Encouraged walking with nursing and continued HEP to strengthen legs and improved endurance. Will continue to follow.     Follow Up Recommendations  CIR;Home health PT(pending continued progression)     Equipment Recommendations  Rolling walker with 5" wheels    Recommendations for Other Services       Precautions / Restrictions Precautions Precautions: Fall Restrictions Weight Bearing Restrictions: No    Mobility  Bed Mobility   Bed Mobility: Supine to Sit     Supine to sit: HOB elevated;Supervision     General bed mobility comments: use of rail with HOB 30 degrees  Transfers Overall transfer level: Needs assistance   Transfers: Sit to/from Stand Sit to Stand: Supervision         General transfer comment: no hands able to stand from bed and remain standing grossly 30 sec prior to hand placement on RW. cues for hand placement to sit in chair  Ambulation/Gait Ambulation/Gait assistance: Min guard Gait Distance (Feet): 200 Feet Assistive device: Rolling walker (2 wheeled) Gait Pattern/deviations: Step-through pattern;Decreased stride  length;Trunk flexed   Gait velocity interpretation: >2.62 ft/sec, indicative of community ambulatory General Gait Details: cues for posture and position in rW, limited by fatigue   Stairs             Wheelchair Mobility    Modified Rankin (Stroke Patients Only)       Balance Overall balance assessment: Mild deficits observed, not formally tested   Sitting balance-Leahy Scale: Good       Standing balance-Leahy Scale: Fair                              Cognition Arousal/Alertness: Awake/alert Behavior During Therapy: WFL for tasks assessed/performed Overall Cognitive Status: Impaired/Different from baseline Area of Impairment: Problem solving                             Problem Solving: Slow processing General Comments: pt HOH and slow to process statements and provide information      Exercises General Exercises - Lower Extremity Long Arc Quad: AROM;15 reps;Seated;Both    General Comments        Pertinent Vitals/Pain Pain Assessment: 0-10 Pain Score: 3  Pain Location: neck during gait Pain Descriptors / Indicators: Aching Pain Intervention(s): Limited activity within patient's tolerance;Monitored during session;Repositioned    Home Living                      Prior Function  PT Goals (current goals can now be found in the care plan section) Progress towards PT goals: Progressing toward goals    Frequency           PT Plan Current plan remains appropriate    Co-evaluation              AM-PAC PT "6 Clicks" Mobility   Outcome Measure  Help needed turning from your back to your side while in a flat bed without using bedrails?: A Little Help needed moving from lying on your back to sitting on the side of a flat bed without using bedrails?: A Little Help needed moving to and from a bed to a chair (including a wheelchair)?: A Little Help needed standing up from a chair using your arms (e.g.,  wheelchair or bedside chair)?: A Little Help needed to walk in hospital room?: A Little Help needed climbing 3-5 steps with a railing? : A Lot 6 Click Score: 17    End of Session Equipment Utilized During Treatment: Gait belt Activity Tolerance: Patient tolerated treatment well Patient left: in chair;with call bell/phone within reach;with chair alarm set;with family/visitor present Nurse Communication: Mobility status PT Visit Diagnosis: Muscle weakness (generalized) (M62.81);Difficulty in walking, not elsewhere classified (R26.2);Other abnormalities of gait and mobility (R26.89)     Time: 1241-1311 PT Time Calculation (min) (ACUTE ONLY): 30 min  Charges:  $Gait Training: 8-22 mins $Therapeutic Activity: 8-22 mins                     Janzen Sacks Pam Drown, PT Acute Rehabilitation Services Pager: [phone] Office: [place] 03/15/2018, 1:50 PM

## 2018-03-15 NOTE — Interval H&P Note (Signed)
History and Physical Interval Note: 72/male with melena post gastric polyps-carcinoid removal while on warfarin for an EGD today, possible endoclip placement to be able to resume warfarin for mechanical mitral valve replacement.  03/15/2018 8:30 AM  Thurman Coyer  has presented today for EGD, with the diagnosis of melena  The various methods of treatment have been discussed with the patient and family. After consideration of risks, benefits and other options for treatment, the patient has consented to  Procedure(s): ESOPHAGOGASTRODUODENOSCOPY (EGD) WITH PROPOFOL (N/A) as a surgical intervention .  [name] history has been reviewed, patient examined, no change in status, stable for surgery.  I have reviewed the patient's chart and labs.  Questions were answered to the patient's satisfaction.     Ronnette Juniper

## 2018-03-15 NOTE — Transfer of Care (Signed)
Immediate Anesthesia Transfer of Care Note  Patient: Mario Powell  Procedure(s) Performed: ESOPHAGOGASTRODUODENOSCOPY (EGD) WITH PROPOFOL (N/A ) HEMOSTASIS CLIP PLACEMENT  Patient Location: Endoscopy Unit  Anesthesia Type:MAC  Level of Consciousness: awake, alert  and oriented  Airway & Oxygen Therapy: Patient connected to nasal cannula oxygen  Post-op Assessment: Post -op Vital signs reviewed and stable  Post vital signs: stable  Last Vitals:  Vitals Value Taken Time  BP 116/57 03/15/2018  9:14 AM  Temp    Pulse 34 03/15/2018  9:12 AM  Resp 15 03/15/2018  9:15 AM  SpO2 77 % 03/15/2018  9:12 AM  Vitals shown include unvalidated device data.  Last Pain:  Vitals:   03/15/18 0801  TempSrc: Oral  PainSc: 0-No pain         Complications: No apparent anesthesia complications

## 2018-03-15 NOTE — Progress Notes (Addendum)
Inpatient Rehabilitation Admissions Coordinator  Discussed pt's progress with P.T., Maija. Feel patient progressing well and can d/c home with Home health when medically ready for d/c. We will sign off at this time. I have notified RN CM, Olga Coaster.  Danne Baxter, RN, MSN Rehab Admissions Coordinator [phone] 03/15/2018 1:52 PM

## 2018-03-15 NOTE — Progress Notes (Signed)
[place] for heparin and warfarin Indication: heparin bridge while INR < 2.5  Allergies  Allergen Reactions  . Penicillins Other (See Comments)    DID THE REACTION INVOLVE: Swelling of the face/tongue/throat, SOB, or low BP? Unknown Sudden or severe rash/hives, skin peeling, or the inside of the mouth or nose? Unknown Did it require medical treatment? Yes When did it last happen?When pt was 72 years old Pt knows that his parents had to take him to the ER at the hospital and has not been retested with any PCN or cephalosporin that he is aware of.    Patient Measurements: Height: 5\' 11"  (180.3 cm) Weight: 173 lb 9.6 oz (78.7 kg) IBW/kg (Calculated) : 75.3 Heparin Dosing Weight: 83.2  Vital Signs: Temp: 97.3 F (36.3 C) (12/31 2021) Temp Source: Oral (12/31 2021) BP: 76/49 (12/31 2021) Pulse Rate: 81 (12/31 2021)  Labs: Recent Labs    03/12/18 2049 03/13/18 0523  03/14/18 0414 03/15/18 0448 03/15/18 0623 03/15/18 1950 03/15/18 2012  HGB  --  6.9*   < > 9.3* 9.7*  --   --  9.7*  HCT  --  21.2*   < > 28.0* 27.8*  --   --  28.2*  PLT  --  200   < > 204 240  --   --  268  LABPROT  --  33.1*  --  26.9* 20.7*  --   --   --   INR  --  3.31  --  2.53 1.81  --   --   --   HEPARINUNFRC 0.42 0.52  --   --   --   --  0.34  --   CREATININE  --  3.30*  --  2.73*  --  2.34*  --   --    < > = values in this interval not displayed.    Estimated Creatinine Clearance: 29.5 mL/min (A) (by C-G formula based on SCr of 2.34 mg/dL (H)).   Medical History: Past Medical History:  Diagnosis Date  . Anasarca 12/01/2017  . Atrial fibrillation ([place]) 07/12/2013   Perioperative, 2001   . CAD (coronary artery disease) of artery bypass graft 07/12/2013   Saphenous vein graft 2001   . Chronic anticoagulation 07/12/2013  . Essential hypertension 07/12/2013  . History of mitral valve replacement with mechanical valve 01/11/2013   Mechanical mitral valve 2001   Bacterial endocarditis as the cause   . HOH (hard of hearing)   . Prostate cancer ([place]) 07/12/2013    Medications:  Scheduled:  . sodium chloride   Intravenous Once  . ezetimibe  10 mg Oral Daily  . feeding supplement  1 Container Oral TID BM  . feeding supplement (PRO-STAT SUGAR FREE 64)  30 mL Oral TID BM  . furosemide  20 mg Intravenous Daily  . levofloxacin  750 mg Oral Q48H  . multivitamin with minerals  1 tablet Oral Daily  . pantoprazole  40 mg Oral BID  . rosuvastatin  40 mg Oral Daily  . spironolactone  25 mg Oral Daily  . Warfarin - Pharmacist Dosing Inpatient   Does not apply q1800    Assessment: 72 yoM admitted 12/26 with complains of shortness of breath and dark stools. PTA patient is on warfarin for mechanical MVR and atrial fibrillation. Warfarin has been held since 12/27 (last dose 12/27 at 0135) since patient had a GI bleed requiring transfusion. Today patient's INR has continue to trend down from 2.53  to 1.81, so pharmacy has been consulted to initiate heparin bridge while patient is off warfarin.   Heparin level was previously therapeutic 1000 units/hr.   PTA warfarin dose: 1mg  daily except for 0.5mg  on M/W/F  Initial heparin level therapeutic at 0.34.   Goal of Therapy:  Heparin level 0.3-0.7 units/ml INR 2.5 - 3.5 Monitor platelets by anticoagulation protocol: Yes   Plan:  Continue heparin infusion at 1000 units/hr  Monitor daily heparin level, CBC, s/s of bleed Restart home warfarin - 1mg  po x 1 tonight  Monitor daily INR and watch closely for s/sx of bleeding  Thank you for allowing pharmacy to be a part of this patient's care.  Elenor Quinones, PharmD, BCPS, [facility] Clinical Pharmacist Phone number [phone] 03/15/2018 8:44 PM

## 2018-03-15 NOTE — Plan of Care (Signed)
  Problem: Clinical Measurements: Goal: Diagnostic test results will improve Outcome: Progressing   Problem: Activity: Goal: Risk for activity intolerance will decrease Outcome: Progressing   Problem: Nutrition: Goal: Adequate nutrition will be maintained Outcome: Progressing   

## 2018-03-15 NOTE — Care Management Note (Signed)
Case Management Note  Patient Details  Name: Mario Powell MRN: [id number] Date of Birth: 05-Aug-1945  Subjective/Objective:      SOB             Action/Plan: Patient lives at home with his daughter Mario Powell; Primary Care Provider: Wilber Oliphant, MD; has private insurance with Medicare; patient was not approved for Inpt Rehab; patient is requesting to go home at discharge; CM talked to daughter at the bedside for [facility] choices, she chose [place]; Dan with [facility] called for arrangements; [place] and 3:1 ordered as requested. CM will continue to follow for progression of care.  Expected Discharge Date:    possibly 03/17/2018              Expected Discharge Plan:  [place]  Discharge planning Services  CM Consult    Choice offered to:  Adult Children, Patient   HH Arranged:  RN, PT, OT, Nurse's Aide [place] Agency:  [place]  Status of Service:  In process, will continue to follow  Sherrilyn Rist [phone] 03/15/2018, 2:37 PM

## 2018-03-15 NOTE — Anesthesia Postprocedure Evaluation (Signed)
Anesthesia Post Note  Patient: Mario Powell  Procedure(s) Performed: ESOPHAGOGASTRODUODENOSCOPY (EGD) WITH PROPOFOL (N/A ) HEMOSTASIS CLIP PLACEMENT     Patient location during evaluation: Endoscopy Anesthesia Type: MAC Level of consciousness: awake and alert, oriented and patient cooperative Pain management: pain level controlled Vital Signs Assessment: post-procedure vital signs reviewed and stable Respiratory status: spontaneous breathing, nonlabored ventilation and respiratory function stable Cardiovascular status: blood pressure returned to baseline and stable Postop Assessment: no apparent nausea or vomiting Anesthetic complications: no    Last Vitals:  Vitals:   03/15/18 0920 03/15/18 0930  BP: (!) 86/48 (!) 95/59  Pulse:  78  Resp: 17 13  Temp:    SpO2:  100%    Last Pain:  Vitals:   03/15/18 0930  TempSrc:   PainSc: 0-No pain                 Makynlee Kressin,E. Nikia Mangino

## 2018-03-15 NOTE — Progress Notes (Signed)
Inpatient Rehabilitation Admissions Coordinator  Noted EGD today. We will follow up after further therapy assessments to eval patient for the ability to tolerate more intense therapies to assist with planning dispo. Noted on evals 12/29 with limitations due to dizziness.   Danne Baxter, RN, MSN Rehab Admissions Coordinator [phone] 03/15/2018 12:20 PM

## 2018-03-15 NOTE — Anesthesia Preprocedure Evaluation (Signed)
Anesthesia Evaluation  Patient identified by MRN, date of birth, ID band Patient awake    Reviewed: Allergy & Precautions, NPO status , Patient's Chart, lab work & pertinent test results  History of Anesthesia Complications Negative for: history of anesthetic complications  Airway Mallampati: I  TM Distance: >3 FB Neck ROM: Full    Dental  (+) Partial Lower, Partial Upper, Dental Advisory Given   Pulmonary former smoker,    breath sounds clear to auscultation       Cardiovascular hypertension, (-) angina+ CAD  + dysrhythmias + Valvular Problems/Murmurs (s/p MVR for SBE)  Rhythm:Regular Rate:Normal  03/11/18 ECHO: EF 60-65%, bioprosthetic mitral valve   Neuro/Psych negative neurological ROS     GI/Hepatic GERD  Medicated,Ascites Neuroendocrine tumor of cecum   Endo/Other  negative endocrine ROS  Renal/GU Renal InsufficiencyRenal disease (creat 2.34)   Prostate cancer    Musculoskeletal   Abdominal   Peds  Hematology  (+) Blood dyscrasia (Hb 9.7), anemia , coumadin   Anesthesia Other Findings   Reproductive/Obstetrics                             Anesthesia Physical Anesthesia Plan  ASA: III  Anesthesia Plan: MAC   Post-op Pain Management:    Induction:   PONV Risk Score and Plan: 1 and Treatment may vary due to age or medical condition  Airway Management Planned: Nasal Cannula and Natural Airway  Additional Equipment:   Intra-op Plan:   Post-operative Plan:   Informed Consent: I have reviewed the patients History and Physical, chart, labs and discussed the procedure including the risks, benefits and alternatives for the proposed anesthesia with the patient or authorized representative who has indicated his/her understanding and acceptance.   Dental advisory given  Plan Discussed with: CRNA and Surgeon  Anesthesia Plan Comments: (Plan routine monitors, MAC)         Anesthesia Quick Evaluation

## 2018-03-16 ENCOUNTER — Encounter (HOSPITAL_COMMUNITY): Payer: Self-pay | Admitting: Gastroenterology

## 2018-03-16 DIAGNOSIS — J90 Pleural effusion, not elsewhere classified: Secondary | ICD-10-CM

## 2018-03-16 DIAGNOSIS — Z7901 Long term (current) use of anticoagulants: Secondary | ICD-10-CM

## 2018-03-16 DIAGNOSIS — E871 Hypo-osmolality and hyponatremia: Secondary | ICD-10-CM

## 2018-03-16 LAB — BASIC METABOLIC PANEL
Anion gap: 4 — ABNORMAL LOW (ref 5–15)
Anion gap: 8 (ref 5–15)
BUN: 80 mg/dL — ABNORMAL HIGH (ref 8–23)
BUN: 96 mg/dL — ABNORMAL HIGH (ref 8–23)
CO2: 28 mmol/L (ref 22–32)
CO2: 21 mmol/L — ABNORMAL LOW (ref 22–32)
Calcium: 7.6 mg/dL — ABNORMAL LOW (ref 8.9–10.3)
Calcium: 7.5 mg/dL — ABNORMAL LOW (ref 8.9–10.3)
Chloride: 90 mmol/L — ABNORMAL LOW (ref 98–111)
Chloride: 92 mmol/L — ABNORMAL LOW (ref 98–111)
Creatinine, Ser: 2.55 mg/dL — ABNORMAL HIGH (ref 0.61–1.24)
Creatinine, Ser: 2.5 mg/dL — ABNORMAL HIGH (ref 0.61–1.24)
GFR calc Af Amer: 28 mL/min — ABNORMAL LOW (ref >60)
GFR calc Af Amer: 28 mL/min — ABNORMAL LOW (ref >60)
GFR calc non Af Amer: 24 mL/min — ABNORMAL LOW (ref >60)
GFR, EST NON AFRICAN AMERICAN: 24 mL/min — AB (ref >60)
Glucose, Bld: 129 mg/dL — ABNORMAL HIGH (ref 70–99)
Glucose, Bld: 123 mg/dL — ABNORMAL HIGH (ref 70–99)
Potassium: 4.9 mmol/L (ref 3.5–5.1)
Potassium: 4.7 mmol/L (ref 3.5–5.1)
SODIUM: 122 mmol/L — AB (ref 135–145)
Sodium: 121 mmol/L — ABNORMAL LOW (ref 135–145)

## 2018-03-16 LAB — HEPARIN LEVEL (UNFRACTIONATED)
Heparin Unfractionated: 0.13 IU/mL — ABNORMAL LOW (ref 0.30–0.70)
Heparin Unfractionated: 0.33 IU/mL (ref 0.30–0.70)

## 2018-03-16 LAB — OSMOLALITY: OSMOLALITY: 283 mosm/kg (ref 275–295)

## 2018-03-16 LAB — PROTIME-INR
INR: 1.65
Prothrombin Time: 19.3 seconds — ABNORMAL HIGH (ref 11.4–15.2)

## 2018-03-16 MED ORDER — WARFARIN SODIUM 1 MG PO TABS
1.0000 mg | ORAL_TABLET | Freq: Once | ORAL | Status: AC
  Administered 2018-03-16: 1 mg via ORAL
  Filled 2018-03-16: qty 1

## 2018-03-16 MED ORDER — FUROSEMIDE 10 MG/ML IJ SOLN
40.0000 mg | Freq: Once | INTRAMUSCULAR | Status: AC
  Administered 2018-03-16: 40 mg via INTRAVENOUS
  Filled 2018-03-16: qty 4

## 2018-03-16 MED ORDER — POLYETHYLENE GLYCOL 3350 17 G PO PACK
17.0000 g | PACK | Freq: Once | ORAL | Status: AC
  Administered 2018-03-16: 17 g via ORAL
  Filled 2018-03-16: qty 1

## 2018-03-16 NOTE — Progress Notes (Signed)
Page to MD on call Family Med Team at [phone] to inform of B/P 83/48. Request recheck and states she would come to bedside. Aware patient is asymptomatic. Patient sitting in chair talkin Request recheck be messaged. Recheck 76/49 paged to MD 2021. Dr Sandi Carne came to bedside with no new orders except observation during eating. Patient sitting in chair., daughter at bedside.

## 2018-03-16 NOTE — Progress Notes (Signed)
[facility] Progress Note  Mario Powell 73 y.o. 14-Mar-1946  CC: GI bleed, recurrent ascites, gastric carcinoid   Subjective: No further bleeding episodes.  Feeling better.  Tolerating diet.    ROS : Negative for bleeding, mild shortness of breath, negative for chest pain   Objective: Vital signs in last 24 hours: Vitals:   03/16/18 0021 03/16/18 0353  BP: (!) 81/52 (!) 83/67  Pulse: 78 78  Resp: 20 18  Temp: 98 F (36.7 C) 98 F (36.7 C)  SpO2: 99% 98%    Physical Exam:  General:  Alert, cooperative, no distress, appears stated age  Head:  Normocephalic, without obvious abnormality, atraumatic  Eyes:  , EOM's intact,   Lungs:    Basilar fine crackles respirations unlabored  Heart:  Regular rate and rhythm, mechanical click  Abdomen:   Soft, non-tender, nondistended, bowel sounds present, no peritoneal signs  Extremities:  Chronic lymphedema noted       Lab Results: Recent Labs    03/15/18 2012 03/16/18 0453  NA 122* 122*  K 4.2 4.9  CL 91* 90*  CO2 24 28  GLUCOSE 122* 129*  BUN 78* 80*  CREATININE 2.45* 2.55*  CALCIUM 7.4* 7.6*   No results for input(s): AST, ALT, ALKPHOS, BILITOT, PROT, ALBUMIN in the last 72 hours. Recent Labs    03/14/18 0414 03/15/18 0448 03/15/18 2012  WBC 4.8 5.3 5.9  NEUTROABS 3.7  --   --   HGB 9.3* 9.7* 9.7*  HCT 28.0* 27.8* 28.2*  MCV 93.0 91.4 92.8  PLT 204 240 268   Recent Labs    03/15/18 0448 03/16/18 0453  LABPROT 20.7* 19.3*  INR 1.81 1.65      Assessment/Plan: -GI bleed with melena.  EGD yesterday showed ulcers at previous polypectomy sites.  3 clips were placed. HGb stable now. -Well-differentiated gastric neuroendocrine tumor.  -Recurrent chylous ascites.  Low SAAG, not consistent with portal hypertension. -History of mechanical mitral valve replacement.  Anticoagulation has been restarted. -History of gastric intestinal metaplasia.  Was found during initial EGD in September 2019 - History  of endocarditis -  paroxysmal atrial fibrillation -  history of coronary artery disease -Chronic kidney disease   Discussion ---------------  Initial EGD in September showed inflammatory polyp at lesser curvature of the stomach near fundus ,which was removed with hot snare and pathology revealed neuroendocrine tumor.  Repeat EGD in December did not showed any residual polyp at  previous polypectomy site however it did revealed granularity and nodularity in the fundus and biopsy again came back positive for well-differentiated neuroendocrine tumor.  3 small hyperplastic polyps were removed during repeat EGD in December 2019 from gastric body.  Recommendations --------------------------- -Possibility of diffuse well-differentiated neuroendocrine tumor in the fundus cannot be ruled out.  Patient had two CT abdomen which did not showed any lesion however they were done without IV contrast. -Etiology of ascites also undetermined. -Consider second opinion from tertiary care center such as [facility] as an outpatient , which is discussed with the patient today  -Monitor H&H and INR. -GI will sign off.  Call us back if needed.  Otis Brace MD, [place] 03/16/2018, 8:56 AM  Contact #  [phone]

## 2018-03-16 NOTE — Consult Note (Addendum)
[facility] Renal Consultation Note  Requesting MD: Dr. [name] Indication for Consultation: AKI and hyponatremia  RJJ:OACZY D Pavel is a 73 y.o. 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    EGD on 12/31 showed ulcers at previous polypectomy sites, clips placed.  His coumadin will be resumed today per cardiology.    He has recurrent chylous ascites of unclear etiology as well as a well differentiated gastric neuroendocrine tumor.  A second opinion from tertiary care center has been recommended for outpatient setting.    He rec'd a course of levaquin for a possible pneumonia.   Sodium generally runs in the low 130s since 11/2017.  During this admission sodium was 130 on 12/26 but starting 12/30 126 > 12/31 6am 122 > 20:12 122 > 1/1 122.  Urine sodium 12/31 at 18:30 was <10, urine osm 431.  Albumin 12/28 2.1.   Creatinine generally in the low 2s.  12/26 3.4 > 12/27 3.4  > 12/28 3.6 > 12/29 3.3 > 12/30 2.7 > 12/31 2.3 > 2.4 > 1/1 2.55.    I/Os net for admission negative 1.3L.    On 12/30 was net neg 1.3L (although ins only 136mL), 12/31 neg 4105mL.  Was being diuresed with IV lasix until 12/31, spironolactone 25 until 1/1.   UA bland 12/27.   Weights documented in chart - admit 12/26 181.8lbs, 12/29 171.5, 12/31 173.6, 03/16/18 175 lbs.   03/11/2018 TTE: EF normal, grade 1 diastolic dysfunction.   He is evaluated with daughter at bedside.  He overall seems to be feeling ok.  He is quite hard of hearing and has some difficulty communicating so his daughter provides complement to history. She says his speech and mental status are at his baseline.  He denies HA, nausea, vision changes.  He felt quite poorly early in admission but following paracentesis 12/28 he felt much improved and started taking robust oral intake of liquids: juice,  water, etc.  He feels that his LE edema is improved compared to months past.  His daughter agrees.   Creatinine  Date/Time Value Ref Range Status  02/02/2018 01:18 PM 2.21 (H) 0.61 - 1.24 mg/dL Final   Creatinine, Ser  Date/Time Value Ref Range Status  03/16/2018 04:53 AM 2.55 (H) 0.61 - 1.24 mg/dL Final  03/15/2018 08:12 PM 2.45 (H) 0.61 - 1.24 mg/dL Final  03/15/2018 06:23 AM 2.34 (H) 0.61 - 1.24 mg/dL Final  03/14/2018 04:14 AM 2.73 (H) 0.61 - 1.24 mg/dL Final  03/13/2018 05:23 AM 3.30 (H) 0.61 - 1.24 mg/dL Final  03/12/2018 03:46 AM 3.59 (H) 0.61 - 1.24 mg/dL Final  03/11/2018 06:07 AM 3.44 (H) 0.61 - 1.24 mg/dL Final  03/10/2018 02:08 PM 3.42 (H) 0.61 - 1.24 mg/dL Final  01/10/2018 10:00 AM 2.07 (H) 0.76 - 1.27 mg/dL Final  01/08/2018 04:52 AM 2.17 (H) 0.61 - 1.24 mg/dL Final  01/07/2018 05:31 AM 2.09 (H) 0.61 - 1.24 mg/dL Final  01/06/2018 03:33 AM 2.02 (H) 0.61 - 1.24 mg/dL Final  01/05/2018 04:19 AM 2.10 (H) 0.61 - 1.24 mg/dL Final  01/04/2018 03:43 AM 2.05 (H) 0.61 - 1.24 mg/dL Final  01/03/2018 11:14 AM 1.89 (H) 0.61 - 1.24 mg/dL Final  12/17/2017 03:55 PM 1.78 (H) 0.76 - 1.27 mg/dL Final  12/08/2017 05:36 AM 1.96 (H) 0.61 - 1.24 mg/dL Final  12/07/2017 07:30 AM  1.84 (H) 0.61 - 1.24 mg/dL Final  12/06/2017 05:52 AM 2.03 (H) 0.61 - 1.24 mg/dL Final  12/05/2017 03:41 AM 2.71 (H) 0.61 - 1.24 mg/dL Final  12/04/2017 05:17 AM 2.53 (H) 0.61 - 1.24 mg/dL Final  12/03/2017 07:07 AM 2.71 (H) 0.61 - 1.24 mg/dL Final  12/02/2017 06:51 AM 2.96 (H) 0.61 - 1.24 mg/dL Final  12/01/2017 11:04 AM 3.12 (H) 0.61 - 1.24 mg/dL Final  01/08/2017 04:03 PM 1.17 0.76 - 1.27 mg/dL Final  06/18/2008 08:34 AM 1.25 0.4 - 1.5 mg/dL Final     PMHx:   Past Medical History:  Diagnosis Date  . Anasarca 12/01/2017  . Atrial fibrillation ([place]) 07/12/2013   Perioperative, 2001   . CAD (coronary artery disease) of artery bypass graft 07/12/2013   Saphenous vein graft 2001   . Chronic anticoagulation  07/12/2013  . Essential hypertension 07/12/2013  . History of mitral valve replacement with mechanical valve 01/11/2013   Mechanical mitral valve 2001  Bacterial endocarditis as the cause   . HOH (hard of hearing)   . Prostate cancer ([place]) 07/12/2013    Past Surgical History:  Procedure Laterality Date  . BIOPSY  12/06/2017   Procedure: BIOPSY;  Surgeon: Otis Brace, MD;  Location: [place];  Service: Gastroenterology;;  . BIOPSY  03/04/2018   Procedure: BIOPSY;  Surgeon: Otis Brace, MD;  Location: [facility];  Service: Gastroenterology;;  . COLONOSCOPY WITH PROPOFOL N/A 12/06/2017   Procedure: COLONOSCOPY WITH PROPOFOL ;  Surgeon: Otis Brace, MD;  Location: [place];  Service: Gastroenterology;  Laterality: N/A;  . CORONARY ARTERY BYPASS GRAFT     2001  . ESOPHAGOGASTRODUODENOSCOPY (EGD) WITH PROPOFOL N/A 12/06/2017   Procedure: ESOPHAGOGASTRODUODENOSCOPY (EGD) WITH PROPOFOL;  Surgeon: Otis Brace, MD;  Location: [place];  Service: Gastroenterology;  Laterality: N/A;  . ESOPHAGOGASTRODUODENOSCOPY (EGD) WITH PROPOFOL N/A 03/04/2018   Procedure: ESOPHAGOGASTRODUODENOSCOPY (EGD) WITH PROPOFOL;  Surgeon: Otis Brace, MD;  Location: [facility];  Service: Gastroenterology;  Laterality: N/A;  . IR PARACENTESIS  12/03/2017  . IR PARACENTESIS  01/04/2018  . IR PARACENTESIS  02/04/2018  . MITRAL VALVE REPLACEMENT  2001   Mechanical prosthesis  . POLYPECTOMY  12/06/2017   Procedure: POLYPECTOMY;  Surgeon: Otis Brace, MD;  Location: [facility] END[facility]COPY;  Service: Gastroenterology;;  . POLYPECTOMY  03/04/2018   Procedure: POLYPECTOMY;  Surgeon: Otis Brace, MD;  Location: [facility];  Service: Gastroenterology;;    Family Hx:  Family History  Problem Relation Age of Onset  . Heart attack Father   . Healthy Mother   . Healthy Sister   . Healthy Sister     Social History:  reports that he has quit smoking. He has never used smokeless  tobacco. He reports previous alcohol use. He reports that he does not use drugs.  Allergies:  Allergies  Allergen Reactions  . Penicillins Other (See Comments)    DID THE REACTION INVOLVE: Swelling of the face/tongue/throat, SOB, or low BP? Unknown Sudden or severe rash/hives, skin peeling, or the inside of the mouth or nose? Unknown Did it require medical treatment? Yes When did it last happen?When pt was 73 years old Pt knows that his parents had to take him to the ER at the hospital and has not been retested with any PCN or cephalosporin that he is aware of.    Medications: Prior to Admission medications   Medication Sig Start Date End Date Taking? Authorizing Provider  Amino Acids-Protein Hydrolys (FEEDING SUPPLEMENT, PRO-STAT SUGAR FREE 64,) LIQD Take 30 mLs  by mouth 3 (three) times daily with meals. Patient taking differently: Take 30 mLs by mouth every evening.  12/09/17  Yes Wilber Oliphant, MD  bismuth subsalicylate (PEPTO BISMOL) 262 MG/15ML suspension Take 30 mLs by mouth as needed for indigestion.   Yes [provider]  calcium-vitamin D (OSCAL WITH D) 500-200 MG-UNIT tablet Take 1 tablet by mouth 3 (three) times daily.    Yes [provider]  Camphor-Eucalyptus-Menthol (VICKS VAPORUB EX) Apply 1 application topically See admin instructions. Apply under nose daily in the evening   Yes [provider]  Cholecalciferol (VITAMIN D-3) 25 MCG (1000 UT) CAPS Take 1,000 Units by mouth daily.   Yes [provider]  enoxaparin (LOVENOX) 80 MG/0.8ML injection Inject 0.8 mLs (80 mg total) into the skin every 12 (twelve) hours. As Instructed by [facility] Clinic 02/25/18  Yes Belva Crome, MD  ezetimibe (ZETIA) 10 MG tablet Take 1 tablet (10 mg total) by mouth daily. PLEASE CALL TO SCHEDULE F/U APPT FOR FUTURE REFILLS.Marland Kitchen3RD AND FINAL ATTEMPT! 02/25/18  Yes Belva Crome, MD  medium chain triglycerides (MCT OIL) oil Take 15 mLs by mouth 3 (three) times daily  with meals. Patient taking differently: Take 15 mLs by mouth at bedtime.  12/08/17  Yes Wilber Oliphant, MD  Multiple Vitamin (MULTIVITAMIN WITH MINERALS) TABS tablet Take 1 tablet by mouth daily. 12/09/17  Yes Wilber Oliphant, MD  Naphazoline-Pheniramine (OPCON-A) 0.027-0.315 % SOLN Place 2-3 drops into both eyes daily as needed (for dry eyes).   Yes [provider]  Omega-3 Fatty Acids (FISH OIL) 1000 MG CAPS Take 2,000 mg by mouth daily.   Yes [provider]  pantoprazole (PROTONIX) 40 MG tablet Take 1 tablet (40 mg total) by mouth daily. 02/15/18  Yes Wilber Oliphant, MD  rosuvastatin (CRESTOR) 40 MG tablet Take 1 tablet (40 mg total) by mouth daily. PLEASE CALL TO SCHEDULE F/U APPT FOR FUTURE REFILLS.Marland Kitchen3RD AND FINAL ATTEMPT! 02/25/18  Yes Belva Crome, MD  spironolactone (ALDACTONE) 100 MG tablet Take 1 tablet (100 mg total) by mouth daily. 01/27/18  Yes Wilber Oliphant, MD  torsemide (DEMADEX) 20 MG tablet Take 1 tablet (20 mg total) by mouth daily. 01/27/18  Yes Wilber Oliphant, MD  warfarin (COUMADIN) 1 MG tablet TAKE AS DIRECTED BY ANTICOAGULATION CLINIC Patient taking differently: Take 0.5-1 mg by mouth See admin instructions. Take 0.5 mg by mouth daily on Monday, Wednesday and Friday. Take 1 mg by mouth daily on all other days. 01/24/18  Yes Belva Crome, MD    I have reviewed the patient's current medications.  Labs:  Results for orders placed or performed during the hospital encounter of 03/10/18 (from the past 48 hour(s))  Protime-INR     Status: Abnormal   Collection Time: 03/15/18  4:48 AM  Result Value Ref Range   Prothrombin Time 20.7 (H) 11.4 - 15.2 seconds   INR 1.81     Comment: Performed at [facility] [address], [place] [address]  CBC     Status: Abnormal   Collection Time: 03/15/18  4:48 AM  Result Value Ref Range   WBC 5.3 4.0 - 10.5 K/uL   RBC 3.04 (L) 4.22 - 5.81 MIL/uL   Hemoglobin 9.7 (L) 13.0 - 17.0 g/dL   HCT 27.8 (L) 39.0 -  52.0 %   MCV 91.4 80.0 - 100.0 fL   MCH 31.9 26.0 - 34.0 pg   MCHC 34.9 30.0 -  36.0 g/dL   RDW 17.6 (H) 11.5 - 15.5 %   Platelets 240 150 - 400 K/uL   nRBC 0.0 0.0 - 0.2 %    Comment: Performed at [facility], [place] [address] [place], [place] [address]  Basic metabolic panel     Status: Abnormal   Collection Time: 03/15/18  6:23 AM  Result Value Ref Range   Sodium 122 (L) 135 - 145 mmol/L   Potassium 4.3 3.5 - 5.1 mmol/L   Chloride 91 (L) 98 - 111 mmol/L   CO2 24 22 - 32 mmol/L   Glucose, Bld 106 (H) 70 - 99 mg/dL   BUN 76 (H) 8 - 23 mg/dL   Creatinine, Ser 2.34 (H) 0.61 - 1.24 mg/dL   Calcium 7.6 (L) 8.9 - 10.3 mg/dL   GFR calc non Af Amer 26 (L) >60 mL/min   GFR calc Af Amer 31 (L) >60 mL/min   Anion gap 7 5 - 15    Comment: Performed at [facility] [address], [place], [place] [address]  Sodium, urine, random     Status: None   Collection Time: 03/15/18  6:30 PM  Result Value Ref Range   Sodium, Ur <10 mmol/L    Comment: Performed at [facility] [address] [place], [place] [address]  Osmolality, urine     Status: None   Collection Time: 03/15/18  6:30 PM  Result Value Ref Range   Osmolality, Ur 431 300 - 900 mOsm/kg    Comment: Performed at [facility] [address] [place], [place] [address]  Heparin level (unfractionated)     Status: None   Collection Time: 03/15/18  7:50 PM  Result Value Ref Range   Heparin Unfractionated 0.34 0.30 - 0.70 IU/mL    Comment: (NOTE) If heparin results are below expected values, and patient dosage has  been confirmed, suggest follow up testing of antithrombin III levels. Performed at [facility], [place] [address], [place], [place] [address]   Basic metabolic panel     Status: Abnormal   Collection Time: 03/15/18  8:12 PM  Result Value Ref Range   Sodium 122 (L) 135 - 145 mmol/L   Potassium 4.2 3.5 - 5.1 mmol/L   Chloride 91 (L) 98 - 111 mmol/L   CO2 24 22 - 32 mmol/L   Glucose, Bld 122  (H) 70 - 99 mg/dL   BUN 78 (H) 8 - 23 mg/dL   Creatinine, Ser 2.45 (H) 0.61 - 1.24 mg/dL   Calcium 7.4 (L) 8.9 - 10.3 mg/dL   GFR calc non Af Amer 25 (L) >60 mL/min   GFR calc Af Amer 29 (L) >60 mL/min   Anion gap 7 5 - 15    Comment: Performed at [facility] [address], [place], [place] [address]  CBC     Status: Abnormal   Collection Time: 03/15/18  8:12 PM  Result Value Ref Range   WBC 5.9 4.0 - 10.5 K/uL   RBC 3.04 (L) 4.22 - 5.81 MIL/uL   Hemoglobin 9.7 (L) 13.0 - 17.0 g/dL   HCT 28.2 (L) 39.0 - 52.0 %   MCV 92.8 80.0 - 100.0 fL   MCH 31.9 26.0 - 34.0 pg   MCHC 34.4 30.0 - 36.0 g/dL   RDW 17.7 (H) 11.5 - 15.5 %   Platelets 268 150 - 400 K/uL   nRBC 0.0 0.0 - 0.2 %  Comment: Performed at [facility], [place] [address], [place], [place] [address]  Protime-INR     Status: Abnormal   Collection Time: 03/16/18  4:53 AM  Result Value Ref Range   Prothrombin Time 19.3 (H) 11.4 - 15.2 seconds   INR 1.65     Comment: Performed at [facility] [address] [place], [place] [address]  Heparin level (unfractionated)     Status: Abnormal   Collection Time: 03/16/18  4:53 AM  Result Value Ref Range   Heparin Unfractionated 0.13 (L) 0.30 - 0.70 IU/mL    Comment: (NOTE) If heparin results are below expected values, and patient dosage has  been confirmed, suggest follow up testing of antithrombin III levels. Performed at [facility], [place] [address] [place], [place] [address]   Basic metabolic panel     Status: Abnormal   Collection Time: 03/16/18  4:53 AM  Result Value Ref Range   Sodium 122 (L) 135 - 145 mmol/L   Potassium 4.9 3.5 - 5.1 mmol/L   Chloride 90 (L) 98 - 111 mmol/L   CO2 28 22 - 32 mmol/L   Glucose, Bld 129 (H) 70 - 99 mg/dL   BUN 80 (H) 8 - 23 mg/dL   Creatinine, Ser 2.55 (H) 0.61 - 1.24 mg/dL   Calcium 7.6 (L) 8.9 - 10.3 mg/dL   GFR calc non Af Amer 24 (L) >60 mL/min   GFR calc Af Amer 28 (L) >60 mL/min   Anion gap 4 (L) 5 - 15     Comment: Performed at [facility] [address], [place], [place] [address]  Heparin level (unfractionated)     Status: None   Collection Time: 03/16/18  9:17 AM  Result Value Ref Range   Heparin Unfractionated 0.33 0.30 - 0.70 IU/mL    Comment: (NOTE) If heparin results are below expected values, and patient dosage has  been confirmed, suggest follow up testing of antithrombin III levels. Performed at [facility], [place] [address] [place], [place] [address]      ROS:  Pertinent items are noted in HPI.  Physical Exam: Vitals:   03/16/18 0021 03/16/18 0353  BP: (!) 81/52 (!) 83/67  Pulse: 78 78  Resp: 20 18  Temp: 98 F (36.7 C) 98 F (36.7 C)  SpO2: 99% 98%     General: chronically ill appearing laying flat in bed HEENT: glasses, MMM Eyes: anicteric, glasses Neck: supple, JVD does not appear elevated Heart: RRR, mitral click Lungs: normal WOB, clear ant, dec BS in bases, no rales or wheeze s Abdomen: soft, mildly distended, nontender Extremities: 2+ pitting to knees and dependent sacral area Skin: no rashes Neuro: dysarthric at times, daughter says normal for him; very hard of hearing.   Assessment/Plan: **hyponatremia:  Globally hypervolemic with peripheral edema, ascites (chylous, unclear etiology) but I think this is more of a mixed picture.  His urine osm are higher than serum which could be consistent with SIADH (many potential etiologies - NE tumor, pneumonia) with confounding by edematous state.  Serum sodium acutely dropped in conjunction with his reported robust increase in oral intake of fluids following his LVP which would be expected with SIADH (Ins no properly documented - patient unaware of how to track ins based on conversation today).  He has anasarca though I think this is multifactorial with hypoalbuminemia, possible impaired venous return with his recurrent ascites (says LE edema improves greatly after LVPs).  He overall is hypervolemic  and I think that fluid and salt restriction are advised at the current point in time (will help if SIADH + hypervolemia).  He is overall asymptomatic and hypertonic saline is not needed at this time --2g sodium diet + 1269mL fluid restriction --BMP this afternoon, tomorrow AM --Hold on diuretic therapy for now but if repeat serum Na worse will consider --Check serum osm as well --Will follow closely with you  **AKI on CKD: baseline 2, creatinine peaked 3.6 12/27 and has improved to 2.55 today.   I suspect this is in part a perfusion issue.   --Daily BMP  **Acute blood loss anemia:  Hb 6.9 now to 9s with transfusion and GI clips.   -No indications for ESA  Justin Mend 03/16/2018, 2:41 PM

## 2018-03-16 NOTE — Progress Notes (Signed)
Chart reviewed and f/u with patient at bedside. No family present. Patient irritable this afternoon and tells me he just laid down to rest. Multiple teams have visited with him this afternoon. Patient denies questions or needs at this time.   NO CHARGE  Ihor Dow, FNP-C Palliative Medicine Team  Phone: [phone] Fax: [phone]

## 2018-03-16 NOTE — Progress Notes (Signed)
Progress Note  Patient Name: Mario Powell Date of Encounter: 03/16/2018  Primary Cardiologist: Illene Labrador, III, MD  Subjective   Mild shortness of breath.  No chest discomfort.  Some dizziness.  Inpatient Medications    Scheduled Meds: . sodium chloride   Intravenous Once  . ezetimibe  10 mg Oral Daily  . feeding supplement  1 Container Oral TID BM  . feeding supplement (PRO-STAT SUGAR FREE 64)  30 mL Oral TID BM  . multivitamin with minerals  1 tablet Oral Daily  . pantoprazole  40 mg Oral BID  . rosuvastatin  40 mg Oral Daily  . Warfarin - Pharmacist Dosing Inpatient   Does not apply q1800   Continuous Infusions: . heparin 1,000 Units/hr (03/16/18 0917)   PRN Meds: acetaminophen, polyethylene glycol, polyvinyl alcohol   Vital Signs    Vitals:   03/15/18 2021 03/15/18 2126 03/16/18 0021 03/16/18 0353  BP: (!) 76/49 (!) 85/56 (!) 81/52 (!) 83/67  Pulse: 81 79 78 78  Resp: 18  20 18   Temp: (!) 97.3 F (36.3 C)  98 F (36.7 C) 98 F (36.7 C)  TempSrc: Oral  Oral Oral  SpO2: 99%  99% 98%  Weight:    79.4 kg  Height:        Intake/Output Summary (Last 24 hours) at 03/16/2018 0952 Last data filed at 03/16/2018 0917 Gross per 24 hour  Intake 1010.33 ml  Output 1200 ml  Net -189.67 ml   Filed Weights   03/14/18 0350 03/15/18 0501 03/16/18 0353  Weight: 78.1 kg 78.7 kg 79.4 kg    Telemetry    Atrial fibrillation controlled rate.- Personally Reviewed  ECG    Relatively low voltage on 03/10/2018.- Personally Reviewed  Physical Exam  Appears chronically ill. GEN: No acute distress.   Neck: No JVD Cardiac: IIRR, no murmurs, rubs, or gallops.  Respiratory: Clear to auscultation bilaterally. GI:  Ascites noted. MS:  Chronic bilateral lower extremity edema. Neuro:  Nonfocal  Psych: Normal affect   Labs    Chemistry Recent Labs  Lab 03/10/18 1408 03/11/18 7893 03/11/18 1621 03/12/18 0346  03/15/18 0623 03/15/18 2012 03/16/18 0453  NA 130*  129*  --  131*   < > 122* 122* 122*  K 5.3* 5.4*  --  4.9   < > 4.3 4.2 4.9  CL 98 96*  --  97*   < > 91* 91* 90*  CO2 21* 24  --  24   < > 24 24 28   GLUCOSE 150* 128*  --  129*   < > 106* 122* 129*  BUN 70* 83*  --  93*   < > 76* 78* 80*  CREATININE 3.42* 3.44*  --  3.59*   < > 2.34* 2.45* 2.55*  CALCIUM 8.8* 8.3*  --  8.4*   < > 7.6* 7.4* 7.6*  PROT 5.4* 4.2* 4.8* 4.7*  --   --   --   --   ALBUMIN 2.2* 1.8* 2.1* 2.1*  --   --   --   --   AST 35 22  --  26  --   --   --   --   ALT 29 21  --  22  --   --   --   --   ALKPHOS 61 45  --  54  --   --   --   --   BILITOT 0.9 0.5  --  0.5  --   --   --   --  GFRNONAA 17* 17*  --  16*   < > 26* 25* 24*  GFRAA 19* 19*  --  18*   < > 31* 29* 28*  ANIONGAP 11 9  --  10   < > 7 7 4*   < > = values in this interval not displayed.     Hematology Recent Labs  Lab 03/14/18 0414 03/15/18 0448 03/15/18 2012  WBC 4.8 5.3 5.9  RBC 3.01* 3.04* 3.04*  HGB 9.3* 9.7* 9.7*  HCT 28.0* 27.8* 28.2*  MCV 93.0 91.4 92.8  MCH 30.9 31.9 31.9  MCHC 33.2 34.9 34.4  RDW 17.7* 17.6* 17.7*  PLT 204 240 268    Cardiac Enzymes Recent Labs  Lab 03/10/18 1408 03/10/18 2003 03/11/18 0157 03/11/18 0607  TROPONINI <0.03 <0.03 <0.03 <0.03   No results for input(s): TROPIPOC in the last 168 hours.   BNP Recent Labs  Lab 03/10/18 1408  BNP 52.8     DDimer No results for input(s): DDIMER in the last 168 hours.   Radiology    No results found.  Cardiac Studies    Echocardiogram this admission demonstrates preserved systolic function with EF 65%, bioprosthetic mitral valve with mildly thickened leaflets and mitral valve area 1.47 cm by continuity equation.  Normal left atrial size.  Mildly dilated RV.  Patient Profile     73 y.o. male withCAD s/p CABG, mechanical mitral valve,prior history of endocarditis, paroxysmal atrial fibrillation, hypertension, chronic left greater than right lower extremity edema,recurrentchylousascites,coumadin  forchronic anticoagulation, prostatecancer s/p treatment,gastric neuroendocrine tumor, recurrent ascitics and CKD stage IV,admitted with acute GI bleed and currentlyoff warfarin.  Assessment & Plan    1. Status post mechanical prosthetic mitral valve, remote. 2. Chronic atrial fibrillation 3. Chronic anticoagulation therapy 4. Admitted with melena.  Nonbleeding gastric ulcer identified.  History of intestinal carcinoid. 5. Acute on chronic kidney disease, stage IV, improving since admission.  RECOMMENDATIONS:  Resume Coumadin.  Overlap with heparin until INR greater than or equal to 2.5.  Please call if we may be of further assistance.  Overall guarded prognosis.  [facility] will sign off.   Medication Recommendations: Please see above recommendations. Other recommendations (labs, testing, etc): None Follow up as an outpatient: As previously scheduled.  For questions or updates, please contact [place] [facility] Please consult [email or web address] for contact info under        Signed, Sinclair Grooms, MD  03/16/2018, 9:52 AM

## 2018-03-16 NOTE — Evaluation (Signed)
Clinical/Bedside Swallow Evaluation Patient Details  Name: Mario Powell MRN: [id number] Date of Birth: 09-11-1945  Today's Date: 03/16/2018 Time: SLP Start Time (ACUTE ONLY): 4098 SLP Stop Time (ACUTE ONLY): 1217 SLP Time Calculation (min) (ACUTE ONLY): 19 min  Past Medical History:  Past Medical History:  Diagnosis Date  . Anasarca 12/01/2017  . Atrial fibrillation ([place]) 07/12/2013   Perioperative, 2001   . CAD (coronary artery disease) of artery bypass graft 07/12/2013   Saphenous vein graft 2001   . Chronic anticoagulation 07/12/2013  . Essential hypertension 07/12/2013  . History of mitral valve replacement with mechanical valve 01/11/2013   Mechanical mitral valve 2001  Bacterial endocarditis as the cause   . HOH (hard of hearing)   . Prostate cancer ([place]) 07/12/2013   Past Surgical History:  Past Surgical History:  Procedure Laterality Date  . BIOPSY  12/06/2017   Procedure: BIOPSY;  Surgeon: Otis Brace, MD;  Location: [place];  Service: Gastroenterology;;  . BIOPSY  03/04/2018   Procedure: BIOPSY;  Surgeon: Otis Brace, MD;  Location: [facility];  Service: Gastroenterology;;  . COLONOSCOPY WITH PROPOFOL N/A 12/06/2017   Procedure: COLONOSCOPY WITH PROPOFOL ;  Surgeon: Otis Brace, MD;  Location: [place];  Service: Gastroenterology;  Laterality: N/A;  . CORONARY ARTERY BYPASS GRAFT     2001  . ESOPHAGOGASTRODUODENOSCOPY (EGD) WITH PROPOFOL N/A 12/06/2017   Procedure: ESOPHAGOGASTRODUODENOSCOPY (EGD) WITH PROPOFOL;  Surgeon: Otis Brace, MD;  Location: [place];  Service: Gastroenterology;  Laterality: N/A;  . ESOPHAGOGASTRODUODENOSCOPY (EGD) WITH PROPOFOL N/A 03/04/2018   Procedure: ESOPHAGOGASTRODUODENOSCOPY (EGD) WITH PROPOFOL;  Surgeon: Otis Brace, MD;  Location: [facility];  Service: Gastroenterology;  Laterality: N/A;  . IR PARACENTESIS  12/03/2017  . IR PARACENTESIS  01/04/2018  . IR PARACENTESIS  02/04/2018  . MITRAL VALVE  REPLACEMENT  2001   Mechanical prosthesis  . POLYPECTOMY  12/06/2017   Procedure: POLYPECTOMY;  Surgeon: Otis Brace, MD;  Location: [facility];  Service: Gastroenterology;;  . POLYPECTOMY  03/04/2018   Procedure: POLYPECTOMY;  Surgeon: Otis Brace, MD;  Location: [facility];  Service: Gastroenterology;;   HPI:  Pt is 73 y.o.  presenting with increasing SOB, ascites, darkened stools. Found to have ascites with L pleural effusion and bibasilar atelectasis. Paracentesis 12/28 removed 9L of fluid. PMH is significant for a.fib, CAD, HTN, MV replacement, anemia, prostate cancer. CXR 12/26 large flowing left pleural effusion. Per chart  His daughter notes that he has been apprehensive about eating as he had a choking episode early in his hospitalization. Had EDG 12/31 due to melena with impression: Melena likely related to post-polypectomy bleed after EGD on 03/04/18, while patient was on warfarin for mechanical mitral valve,history of endocarditis   Assessment / Plan / Recommendation Clinical Impression  Pt with somewhat ambiguous reports of current swallow function stating swallow has significantly improved since fluid was removed and reported food was sticking and coming back up then reporting difficulty swallowing sausage yesterday with pharyngeal globus sensation and prefers warm liquids. No s/s aspiraiton during observation of solids/liquids. His speech is noteably dysarthric, not resembling "deaf" speech due to hearing loss but more neuro based dysarthria (?) and questionable ataxic arm movements. He attributes slurred speech due to incomplete dentition (cannot find history of neuromuscular disorder in chart). Pt may have esophageal involvement causing globus sensation. Pt interrupted SLP during education of esophageal precautions. Spoke with RN who can notify daughter to follow up on esophageal work up if symptoms worsen or neurologist if speech or strength  worsens. Recommend continue  regular/thin liquids and precautions. No f/u needed.      SLP Visit Diagnosis: Dysphagia, unspecified (R13.10)    Aspiration Risk  Mild aspiration risk    Diet Recommendation Regular;Thin liquid   Liquid Administration via: Straw;Cup Medication Administration: Whole meds with liquid Supervision: Patient able to self feed Compensations: Slow rate;Small sips/bites;Follow solids with liquid Postural Changes: Seated upright at 90 degrees    Other  Recommendations Oral Care Recommendations: Oral care BID   Follow up Recommendations None      Frequency and Duration            Prognosis        Swallow Study   General HPI: Pt is 73 y.o.  presenting with increasing SOB, ascites, darkened stools. Found to have ascites with L pleural effusion and bibasilar atelectasis. Paracentesis 12/28 removed 9L of fluid. PMH is significant for a.fib, CAD, HTN, MV replacement, anemia, prostate cancer. CXR 12/26 large flowing left pleural effusion. Per chart  His daughter notes that he has been apprehensive about eating as he had a choking episode early in his hospitalization. Had EDG 12/31 due to melena with impression: Melena likely related to post-polypectomy bleed after EGD on 03/04/18, while patient was on warfarin for mechanical mitral valve,history of endocarditis Type of Study: Bedside Swallow Evaluation Previous Swallow Assessment: (none) Diet Prior to this Study: Regular;Thin liquids Temperature Spikes Noted: No Respiratory Status: Room air History of Recent Intubation: No Behavior/Cognition: Alert;Cooperative Oral Cavity Assessment: Within Functional Limits Oral Care Completed by SLP: No Oral Cavity - Dentition: Other (Comment)(missing some upper and lower) Vision: Functional for self-feeding Self-Feeding Abilities: Able to feed self Patient Positioning: Upright in bed Baseline Vocal Quality: Normal Volitional Cough: Strong Volitional Swallow: Able to elicit    Oral/Motor/Sensory  Function Overall Oral Motor/Sensory Function: Within functional limits   Ice Chips Ice chips: Not tested   Thin Liquid Thin Liquid: Within functional limits Presentation: Cup;Straw    Nectar Thick Nectar Thick Liquid: Not tested   Honey Thick Honey Thick Liquid: Not tested   Puree Puree: Not tested   Solid     Solid: Within functional limits      Mick Sell, [name] 03/16/2018,1:17 PM    [name] [place] Risk analyst [phone] Office [phone]

## 2018-03-16 NOTE — Progress Notes (Signed)
[place] for heparin and warfarin Indication: heparin bridge while INR < 2.5  Allergies  Allergen Reactions  . Penicillins Other (See Comments)    DID THE REACTION INVOLVE: Swelling of the face/tongue/throat, SOB, or low BP? Unknown Sudden or severe rash/hives, skin peeling, or the inside of the mouth or nose? Unknown Did it require medical treatment? Yes When did it last happen?When pt was 73 years old Pt knows that his parents had to take him to the ER at the hospital and has not been retested with any PCN or cephalosporin that he is aware of.    Patient Measurements: Height: 5\' 11"  (180.3 cm) Weight: 175 lb (79.4 kg) IBW/kg (Calculated) : 75.3 Heparin Dosing Weight: 83.2  Vital Signs: Temp: 98 F (36.7 C) (01/01 0353) Temp Source: Oral (01/01 0353) BP: 83/67 (01/01 0353) Pulse Rate: 78 (01/01 0353)  Labs: Recent Labs    03/14/18 0414 03/15/18 0448 03/15/18 3614 03/15/18 1950 03/15/18 2012 03/16/18 0453 03/16/18 0917  HGB 9.3* 9.7*  --   --  9.7*  --   --   HCT 28.0* 27.8*  --   --  28.2*  --   --   PLT 204 240  --   --  268  --   --   LABPROT 26.9* 20.7*  --   --   --  19.3*  --   INR 2.53 1.81  --   --   --  1.65  --   HEPARINUNFRC  --   --   --  0.34  --  0.13* 0.33  CREATININE 2.73*  --  2.34*  --  2.45* 2.55*  --     Estimated Creatinine Clearance: 27.1 mL/min (A) (by C-G formula based on SCr of 2.55 mg/dL (H)).   Medical History: Past Medical History:  Diagnosis Date  . Anasarca 12/01/2017  . Atrial fibrillation ([place]) 07/12/2013   Perioperative, 2001   . CAD (coronary artery disease) of artery bypass graft 07/12/2013   Saphenous vein graft 2001   . Chronic anticoagulation 07/12/2013  . Essential hypertension 07/12/2013  . History of mitral valve replacement with mechanical valve 01/11/2013   Mechanical mitral valve 2001  Bacterial endocarditis as the cause   . HOH (hard of hearing)   . Prostate cancer ([place])  07/12/2013    Medications:  Scheduled:  . sodium chloride   Intravenous Once  . ezetimibe  10 mg Oral Daily  . feeding supplement  1 Container Oral TID BM  . feeding supplement (PRO-STAT SUGAR FREE 64)  30 mL Oral TID BM  . multivitamin with minerals  1 tablet Oral Daily  . pantoprazole  40 mg Oral BID  . rosuvastatin  40 mg Oral Daily  . Warfarin - Pharmacist Dosing Inpatient   Does not apply q1800    Assessment: 46 yoM admitted 12/26 with complains of shortness of breath and dark stools. PTA patient is on warfarin for mechanical MVR and atrial fibrillation. Warfarin held from 12/27-12/30 (last dose 12/27 at 0135) since patient had a GI bleed requiring transfusion. Warfarin resumed 12/31. Patient's INR subtherapeutic yesterday at 1.81, so pharmacy has been consulted to initiate heparin bridge until therapeutic INR.   Heparin level was previously therapeutic 1000 units/hr.   Heparin level this morning was subtherapeutic at 0.13, however was therapeutic last evening. No known infusion interruptions or line complications per RN. Sent for repeat heparin level and it resulted as therapeutic at 0.33.  INR today is  1.65  PTA warfarin dose: 1mg  daily except for 0.5mg  on M/W/F   Goal of Therapy:  Heparin level 0.3-0.7 units/ml INR 2.5 - 3.5 Monitor platelets by anticoagulation protocol: Yes   Plan:  Continue heparin infusion at 1000 units/hr  Monitor daily heparin level, CBC Warfarin - 1 mg po x 1 tonight  Monitor daily INR Watch closely for s/sx of bleeding  Thank you for allowing pharmacy to be a part of this patient's care.  Vertis Kelch, PharmD PGY1 Pharmacy Resident Phone [phone] 03/16/2018       10:24 AM

## 2018-03-16 NOTE — Progress Notes (Signed)
Family Medicine Teaching Service Daily Progress Note Intern Pager: [phone]  Patient name: Mario Powell Medical record number: [id number] Date of birth: [birth year] Age: 73 y.o. 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 PMH is significant for gastric neuroendocrine polyps, history of mitral valve replacement with mechanical valve on chronic anticoagulation, HTN, prostate cancer s/p treatment, paroxysmal atrial fibrillation, anasarca of unknown source, CKD, normocytic anemia, hyponatremia, and chronic left pleural effusion.   Acute GI bleed.  Hgb 8.7>>6.0.>2 UpRBC>8.7>>6.9>2 units RBCs >9.7(12/31)>9.7(12/31pm)).  EGD 12/31 with nonbleeding gastric ulcers, clips placed during procedure.  GI recommends outpatient follow-up with Dr. [name] for history of gastric carcinoid.  Plan to resume anticoagulation with heparin bridge to Coumadin starting today per cards recommendation.  Goal INR 2.5-3.5 for mechanical heart valve.   - Resume regular diet per GI -Continue Heparin -continue coumadin -follow INR daily - Protonix 40 mg twice daily - monitor Hgb - Monitor melena  Hyponatremia: Chronic, acutely worsened   Na 130>>122(12/31), baseline appears around 131-135.  Urine osmolality 431, urine sodium 1-10.    Serum osmolality 279 (mildly depressed).  difficult to assess whether this is related to fluid overload versus intravascularly depleted in setting of diuresis. -Monitor CMP -Free water restriction, 1200 mL's  Shortness of breath, multifactorial 2/2 ascites, chronic pleural effusion, LL Pneumonia, Anemia from GI bleed Large volume paracentesis 12/28, 9  liters.  Expect eventual reaccumulation -spironolactone 25mg  - lasix 20mg  - Levaquin PO (12/26-1/1), started in the ED, for pneumonia coverage - Incentive spirometry - Consider thoracentesis for chronic left effusion - Oxygen therapy, wean as tolerated   Anasarca, associated with ascites: Acute on chronic. Improved with 9L removed with paracentesis 12/28. With 2+ pitting edema to LE to shins bilaterally. SAAG: 0.7 (unlikely to be from portal vein hypertension), LD 120, triglycerides 1,113, milky, WBCs 342. - lasix 20mg  IV daily, held this am due to lower BP, consider transitioning to PO once achieving desired diuresis - spironolactone 25mg  daily - Continued work-up with GI and PCP for exact etiology, may require lymphoscintigraphy or similar MR imaging for evaluation of obstrucion  AKI on CKD:  Cr 3.42>>2.34 (12/31), BL appears to be around 2.21.  Patient was recently started on spironolactone and furosemide on 11/14 by his PCP.  - holding spironolactone - holding lasix - Avoid nephrotoxic medications as possible - Monitor fluid status closely - Strict I and O  Gastric neuroendocrine tumor: Acute, recurrent.  Previously diagnosed and biopsied with small margins via EGD in September.  However recently had an EGD on 12/20 with surgical pathology showing additional well differentiated neuroendocrine tumor extending to the edge of biopsy.  Follows with [facility] and Dr. Annamaria Boots with hematology/oncology. - To follow-up with Dr. [name] outpt  History of mitral valve replacement with mechanical valve: Chronic, stable. Chronically on anticoagulation with warfarin.    EGD completed today, will start heparin bridge to Coumadin with INR goal 2.5-3.5 per cardiology recommendation.   -Heparin and Coumadin per pharmacy  Severe protein-calorie malnutrition: Chronic.  Albumin 1.8>2.1.  Family states he has a decreased appetite. - Nutrition consult -Boost breeze nutrition supplement 3 times  daily, Protostat  3 times daily, multivitamin  Paroxysmal atrial fibrillation: Chronic, stable. Currently in sinus rhythm, HR 80-100.  On chronic warfarin therapy as above, not on any rate controlling regimen. - Monitor on telemetry  History of prostate cancer: Stable.  Patient reports this was 2 decades ago, s/p treatment.  No current concerns. -Monitor outpatient  Chronic hypertension: Stable. 86/48 this a.m. On diuretic therapy with spironolactone and torsemide has been controlling his pressure. - Holding home diuretic therapy in the setting of AKI, patient normotensive  Vitamin B12 deficiency: Chronic, stable.  Follows with Dr. Annamaria Boots outpatient for injections monthly, last injection on 11/20. - Consider vitamin B12 injection vs deferment to outpatient  Normocytic anemia: Chronic.  Previously megaloblastic, receiving B12 injections monthly.  Hemoglobin improved this morning 9.7, baseline around 9-10.  No bleeding noted on EGD, no overt signs of bleeding on exam.    - Monitor CBC  FEN/GI:  Regular diet Prophylaxis:  Heparin bridge to warfarin  Disposition: DC to CIR following resolution of GI bleed.  Subjective:  There was significant concern for hypotension overnight with maps in the high 50s and low 60s.  More recently his blood pressures appear to be returning to normal with last map 74.  When seen this morning, Mr. Gehl was feeling well.  He had no new complaints this morning.  He denied headache, chest pain, stomach pain.  Objective: Temp:  [97.3 F (36.3 C)-98 F (36.7 C)] 98 F (36.7 C) (01/01 0353) Pulse Rate:  [74-81] 78 (01/01 0353) Resp:  [13-20] 18 (01/01 0353) BP: (76-116)/(48-67) 83/67 (01/01 0353) SpO2:  [98 %-100 %] 98 % (01/01 0353) Weight:  [79.4 kg] 79.4 kg (01/01 0353) Intake/Output 12/31 0701 - 01/01 0700 In: 770.3 [P.O.:720; I.V.:50.3] Out: 1200 [Urine:1200]  Physical Exam:  General: Alert and cooperative and appears to be in no acute  distress.  He seemed to be in good spirits this morning and is very hard of hearing. HEENT: Neck non-tender without lymphadenopathy, masses or thyromegaly Cardio: Normal S1, loud S2.  No murmurs/rubs/gallops  Pulm: Clear to auscultation bilaterally, no crackles, wheezing, or diminished breath sounds. Normal respiratory effort Abdomen: Bowel sounds normal. Abdomen soft and non-tender.  Extremities: Positive lower extremity edema extending up to mid thigh.  Small areas of dried, scaling ski around his medial malleoli.  Strong radial pulses. Neuro: Cranial nerves grossly intact  Laboratory: Recent Labs  Lab 03/14/18 0414 03/15/18 0448 03/15/18 2012  WBC 4.8 5.3 5.9  HGB 9.3* 9.7* 9.7*  HCT 28.0* 27.8* 28.2*  PLT 204 240 268   Recent Labs  Lab 03/10/18 1408 03/11/18 0607 03/11/18 1621 03/12/18 0346  03/14/18 0414 03/15/18 0623 03/15/18 2012  NA 130* 129*  --  131*   < > 126* 122* 122*  K 5.3* 5.4*  --  4.9   < > 4.8 4.3 4.2  CL 98 96*  --  97*   < > 94* 91* 91*  CO2 21* 24  --  24   < > 25 24 24   BUN 70* 83*  --  93*   < > 83* 76* 78*  CREATININE 3.42* 3.44*  --  3.59*   < > 2.73* 2.34* 2.45*  CALCIUM 8.8* 8.3*  --  8.4*   < > 7.9* 7.6* 7.4*  PROT 5.4* 4.2* 4.8* 4.7*  --   --   --   --   BILITOT 0.9 0.5  --  0.5  --   --   --   --  ALKPHOS 61 45  --  54  --   --   --   --   ALT 29 21  --  22  --   --   --   --   AST 35 22  --  26  --   --   --   --   GLUCOSE 150* 128*  --  129*   < > 108* 106* 122*   < > = values in this interval not displayed.    Imaging/Diagnostic Tests: EGD 12/31 - nonbleeding gastric ulcers, no acute bleed present  Matilde Haymaker, MD 03/16/2018, 5:45 AM PGY-2, [place] Intern pager: [phone], text pages welcome

## 2018-03-17 DIAGNOSIS — I959 Hypotension, unspecified: Secondary | ICD-10-CM

## 2018-03-17 LAB — CULTURE, BODY FLUID W GRAM STAIN -BOTTLE: Culture: NO GROWTH

## 2018-03-17 LAB — BASIC METABOLIC PANEL
Anion gap: 7 (ref 5–15)
BUN: 78 mg/dL — ABNORMAL HIGH (ref 8–23)
CO2: 24 mmol/L (ref 22–32)
CREATININE: 2.72 mg/dL — AB (ref 0.61–1.24)
Calcium: 7.6 mg/dL — ABNORMAL LOW (ref 8.9–10.3)
Chloride: 91 mmol/L — ABNORMAL LOW (ref 98–111)
GFR calc Af Amer: 26 mL/min — ABNORMAL LOW (ref >60)
GFR calc non Af Amer: 22 mL/min — ABNORMAL LOW (ref >60)
Glucose, Bld: 126 mg/dL — ABNORMAL HIGH (ref 70–99)
Potassium: 4.5 mmol/L (ref 3.5–5.1)
Sodium: 122 mmol/L — ABNORMAL LOW (ref 135–145)

## 2018-03-17 LAB — CBC
HCT: 27.6 % — ABNORMAL LOW (ref 39.0–52.0)
Hemoglobin: 9.2 g/dL — ABNORMAL LOW (ref 13.0–17.0)
MCH: 30.4 pg (ref 26.0–34.0)
MCHC: 33.3 g/dL (ref 30.0–36.0)
MCV: 91.1 fL (ref 80.0–100.0)
PLATELETS: 229 10*3/uL (ref 150–400)
RBC: 3.03 MIL/uL — ABNORMAL LOW (ref 4.22–5.81)
RDW: 17.2 % — AB (ref 11.5–15.5)
WBC: 5.8 10*3/uL (ref 4.0–10.5)
nRBC: 0 % (ref 0.0–0.2)

## 2018-03-17 LAB — HEPARIN LEVEL (UNFRACTIONATED): Heparin Unfractionated: 0.52 IU/mL (ref 0.30–0.70)

## 2018-03-17 LAB — PROTIME-INR
INR: 1.46
PROTHROMBIN TIME: 17.6 s — AB (ref 11.4–15.2)

## 2018-03-17 LAB — CULTURE, BODY FLUID-BOTTLE

## 2018-03-17 MED ORDER — ALBUMIN HUMAN 5 % IV SOLN
25.0000 g | Freq: Once | INTRAVENOUS | Status: AC
  Administered 2018-03-17: 25 g via INTRAVENOUS
  Filled 2018-03-17: qty 500

## 2018-03-17 MED ORDER — BOOST / RESOURCE BREEZE PO LIQD CUSTOM
1.0000 | Freq: Two times a day (BID) | ORAL | Status: DC
  Administered 2018-03-19 – 2018-03-22 (×5): 1 via ORAL

## 2018-03-17 MED ORDER — WARFARIN SODIUM 2 MG PO TABS
2.0000 mg | ORAL_TABLET | Freq: Once | ORAL | Status: AC
  Administered 2018-03-17: 2 mg via ORAL
  Filled 2018-03-17: qty 1

## 2018-03-17 NOTE — Care Management Important Message (Signed)
Important Message  Patient Details  Name: Mario Powell MRN: [id number] Date of Birth: 1945-10-20   Medicare Important Message Given:  Yes    Barb Merino Veniamin Kincaid 03/17/2018, 4:09 PM

## 2018-03-17 NOTE — Progress Notes (Signed)
   03/17/18 0424  Vitals  Temp 97.6 F (36.4 C)  Temp Source Oral  BP (!) 78/48  MAP (mmHg) (!) 58  BP Location Left Arm  BP Method Automatic  Patient Position (if appropriate) Lying  Pulse Rate 77  Pulse Rate Source Monitor  Resp 16  Oxygen Therapy  SpO2 99 %  O2 Device Nasal Cannula  O2 Flow Rate (L/min) 2 L/min  Height and Weight  Weight 77.2 kg  Type of Scale Used Standing (scale a)  Type of Weight Actual  BMI (Calculated) 23.76   Paged Provider. Awaiting response.

## 2018-03-17 NOTE — Plan of Care (Signed)
  Problem: Education: Goal: Knowledge of General Education information will improve Description Including pain rating scale, medication(s)/side effects and non-pharmacologic comfort measures Outcome: Progressing   Problem: Education: Goal: Knowledge of General Education information will improve Description Including pain rating scale, medication(s)/side effects and non-pharmacologic comfort measures Outcome: Progressing   Problem: Clinical Measurements: Goal: Will remain free from infection Outcome: Progressing

## 2018-03-17 NOTE — Progress Notes (Signed)
[place] for heparin and warfarin Indication: heparin bridge while INR < 2.5  Allergies  Allergen Reactions  . Penicillins Other (See Comments)    DID THE REACTION INVOLVE: Swelling of the face/tongue/throat, SOB, or low BP? Unknown Sudden or severe rash/hives, skin peeling, or the inside of the mouth or nose? Unknown Did it require medical treatment? Yes When did it last happen?When pt was 73 years old Pt knows that his parents had to take him to the ER at the hospital and has not been retested with any PCN or cephalosporin that he is aware of.    Patient Measurements: Height: 5\' 11"  (180.3 cm) Weight: 170 lb 4.8 oz (77.2 kg) IBW/kg (Calculated) : 75.3 Heparin Dosing Weight: 83.2  Vital Signs: Temp: 98.1 F (36.7 C) (01/02 0939) Temp Source: Oral (01/02 0939) BP: 78/59 (01/02 0939) Pulse Rate: 74 (01/02 0939)  Labs: Recent Labs    03/15/18 0448  03/15/18 2012 03/16/18 0453 03/16/18 0917 03/16/18 1620 03/17/18 0358  HGB 9.7*  --  9.7*  --   --   --  9.2*  HCT 27.8*  --  28.2*  --   --   --  27.6*  PLT 240  --  268  --   --   --  229  LABPROT 20.7*  --   --  19.3*  --   --  17.6*  INR 1.81  --   --  1.65  --   --  1.46  HEPARINUNFRC  --    < >  --  0.13* 0.33  --  0.52  CREATININE  --    < > 2.45* 2.55*  --  2.50* 2.72*   < > = values in this interval not displayed.    Estimated Creatinine Clearance: 25.4 mL/min (A) (by C-G formula based on SCr of 2.72 mg/dL (H)).   Medical History: Past Medical History:  Diagnosis Date  . Anasarca 12/01/2017  . Atrial fibrillation ([place]) 07/12/2013   Perioperative, 2001   . CAD (coronary artery disease) of artery bypass graft 07/12/2013   Saphenous vein graft 2001   . Chronic anticoagulation 07/12/2013  . Essential hypertension 07/12/2013  . History of mitral valve replacement with mechanical valve 01/11/2013   Mechanical mitral valve 2001  Bacterial endocarditis as the cause   . HOH (hard  of hearing)   . Prostate cancer ([place]) 07/12/2013    Medications:  Scheduled:  . sodium chloride   Intravenous Once  . ezetimibe  10 mg Oral Daily  . feeding supplement  1 Container Oral TID BM  . feeding supplement (PRO-STAT SUGAR FREE 64)  30 mL Oral TID BM  . multivitamin with minerals  1 tablet Oral Daily  . pantoprazole  40 mg Oral BID  . rosuvastatin  40 mg Oral Daily  . Warfarin - Pharmacist Dosing Inpatient   Does not apply q1800    Assessment: 28 yoM admitted 12/26 with complains of shortness of breath and dark stools. PTA patient is on warfarin for mechanical MVR and atrial fibrillation. Warfarin held from 12/27-12/30 (last dose 12/27 at 0135) since patient had a GI bleed requiring transfusion. Warfarin resumed 12/31. Patient's INR subtherapeutic on 03/15/18 ,thus pharmacy was consulted to initiate heparin bridge until therapeutic INR.   Heparin level this morning = 0.52, therapeutic on IV heparin drip 1000 units/hr.  INR today is 1.46  remains subtherapeutic, has decreased on 1mg  daily  for last 2 days despite potential increased in  warfarin effect from the  levofloxacin 12/26 thru 1/1. Hgb 9.2 low/stable and pltc wnl.  No bleeding noted  PTA warfarin dose: 1mg  daily except for 0.5mg  on M/W/F     Goal of Therapy:  Heparin level 0.3-0.7 units/ml INR 2.5 - 3.5 Monitor platelets by anticoagulation protocol: Yes   Plan:  Continue heparin infusion at 1000 units/hr  Monitor daily heparin level, CBC Warfarin 2 mg po x 1 tonight  Monitor daily INR Watch closely for s/sx of bleeding  Thank you for allowing pharmacy to be a part of this patient's care.  Nicole Cella, RPh Clinical Pharmacist Please check AMION for all [place] phone numbers After 10:00 PM, call [place] [phone] 03/17/2018       12:05 PM

## 2018-03-17 NOTE — Progress Notes (Signed)
Nutrition Follow-up  DOCUMENTATION CODES:   Severe malnutrition in context of chronic illness  INTERVENTION:   -Decrease Boost Breeze po to BID, each supplement provides 250 kcal and 9 grams of protein -Continue 30 ml Prostat TID, each supplement provides 100 kcals and 15 grams protein -Snacks BID -Continue MVI with minerals daily  NUTRITION DIAGNOSIS:   Severe Malnutrition related to chronic illness(chronic lt pleural effusions) as evidenced by severe fat depletion, severe muscle depletion, edema.  Ongoing  GOAL:   Patient will meet greater than or equal to 90% of their needs  Progressing  MONITOR:   PO intake, Supplement acceptance, Diet advancement, Labs, Weight trends, Skin, I & O's  REASON FOR ASSESSMENT:   Consult, Malnutrition Screening Tool Assessment of nutrition requirement/status, Poor PO  ASSESSMENT:   Mario Powell is a 73 y.o. male presenting with increasing shortness of breath, ascites, darkened stools for the past few days, and found to have a possible LLL pneumonia on chest x-ray. PMH is significant for gastric neuroendocrine polyps, history of mitral valve replacement with mechanical valve on chronic anticoagulation, HTN, prostate cancer s/p treatment, paroxysmal atrial fibrillation, anasarca of unknown source, CKD, normocytic anemia, hyponatremia, and chronic left pleural effusion.   12/28- s/p RLQ paracentesis (9 L removed), chylous ascites 12/31- advanced to solid diet with 1200 ml fluid restriction; s/p EGD and hemostasis clip placement 1/1- s/p BSE- recommend continue regular consistency diet with thin liquids, but also possible esophageal work-up if family desires  Reviewed I/O's: -1 L x 24 hours and -2.4 L since admission  Pt resting quietly at time of visit. RD did not disturb.  Per MD notes, considering second opinion at [facility]. Pt with overall poor prognosis; plan for potential palliative care consult.  Pt with good appetite; meal completion  100%. Pt is taking supplements 50-75% of time per [facility].    Labs reviewed: Na: 122.   Diet Order:   Diet Order            Diet 2 gram sodium Room service appropriate? Yes; Fluid consistency: Thin; Fluid restriction: 1200 mL Fluid  Diet effective now              EDUCATION NEEDS:   Not appropriate for education at this time  Skin:  Skin Assessment: Reviewed RN Assessment  Last BM:  03/13/18  Height:   Ht Readings from Last 1 Encounters:  03/10/18 5\' 11"  (2.197 m)    Weight:   Wt Readings from Last 1 Encounters:  03/17/18 77.2 kg    Ideal Body Weight:  78.2 kg  BMI:  Body mass index is 23.75 kg/m.  Estimated Nutritional Needs:   Kcal:  5883-2549  Protein:  120-135 grams  Fluid:  per MD    Adelma Bowdoin A. Jimmye Norman, RD, LDN, CDE Pager: [phone] After hours Pager: [phone]

## 2018-03-17 NOTE — Progress Notes (Signed)
[facility] Progress Note   Subjective:   Denies new complaints this AM.  No HA, confusion.  Last PM serum sodium to 121, gave lasix 40 IV x 1.  Augmented UOP - I/Os yesterday 1.1 / 2.1, for net neg 1L.  Cr up to 2.7 this AM.  BP inyo 70/40-50s ths AM from baseline of 80-90s/50s.  He remains on 2L [place] with sats in the high 90s.   Objective Vitals:   03/17/18 0027 03/17/18 0424 03/17/18 0520 03/17/18 0939  BP: (!) 101/59 (!) 78/48 (!) 76/44 (!) 78/59  Pulse: 77 77 80 74  Resp: 18 16  14   Temp: 98.4 F (36.9 C) 97.6 F (36.4 C)  98.1 F (36.7 C)  TempSrc: Oral Oral  Oral  SpO2: 98% 99%  98%  Weight:  77.2 kg    Height:       Physical Exam General: chronically ill appearing laying flat in bed HEENT: glasses, MMM Eyes: anicteric, glasses Neck: supple, JVD does not appear elevated Heart: RRR, mitral click Lungs: normal WOB, clear ant, dec BS in bases, no rales or wheeze s Abdomen: soft, mildly distended, nontender Extremities: 2+ pitting to knees and dependent sacral area - unchanged compared to yesterday Skin: no rashes Neuro: dysarthric at times, unchanged (per family chronic); very hard of hearing.   Additional Objective Labs: Basic Metabolic Panel: Recent Labs  Lab 03/16/18 0453 03/16/18 1620 03/17/18 0358  NA 122* 121* 122*  K 4.9 4.7 4.5  CL 90* 92* 91*  CO2 28 21* 24  GLUCOSE 129* 123* 126*  BUN 80* 96* 78*  CREATININE 2.55* 2.50* 2.72*  CALCIUM 7.6* 7.5* 7.6*   Liver Function Tests: Recent Labs  Lab 03/10/18 1408 03/11/18 0607 03/11/18 1621 03/12/18 0346  AST 35 22  --  26  ALT 29 21  --  22  ALKPHOS 61 45  --  54  BILITOT 0.9 0.5  --  0.5  PROT 5.4* 4.2* 4.8* 4.7*  ALBUMIN 2.2* 1.8* 2.1* 2.1*   Recent Labs  Lab 03/10/18 1408  LIPASE 33   CBC: Recent Labs  Lab 03/10/18 1408  03/11/18 0607  03/13/18 1813 03/14/18 0414 03/15/18 0448 03/15/18 2012 03/17/18 0358  WBC 6.5  --  4.8   < > 4.8 4.8 5.3 5.9 5.8  NEUTROABS 5.9   --  4.1  --   --  3.7  --   --   --   HGB 8.7*   < > 6.0*   < > 9.4* 9.3* 9.7* 9.7* 9.2*  HCT 27.2*   < > 18.2*   < > 28.6* 28.0* 27.8* 28.2* 27.6*  MCV 98.2  --  97.3   < > 93.5 93.0 91.4 92.8 91.1  PLT 339  --  217   < > 207 204 240 268 229   < > = values in this interval not displayed.   Blood Culture    Component Value Date/Time   SDES PERITONEAL 03/12/2018 1145   SDES PERITONEAL 03/12/2018 1145   SPECREQUEST NONE 03/12/2018 1145   SPECREQUEST NONE 03/12/2018 1145   CULT  03/12/2018 1145    NO GROWTH 4 DAYS Performed at [facility], [place] [address], [place], [place] [address]    REPTSTATUS PENDING 03/12/2018 1145   REPTSTATUS 03/12/2018 FINAL 03/12/2018 1145    Cardiac Enzymes: Recent Labs  Lab 03/10/18 1408 03/10/18 2003 03/11/18 0157 03/11/18 0607  TROPONINI <0.03 <0.03 <0.03 <0.03   CBG: Recent Labs  Lab 03/10/18 1751  GLUCAP 122*   Iron Studies: No results for input(s): IRON, TIBC, TRANSFERRIN, FERRITIN in the last 72 hours. @lablastinr3 @ Studies/Results: No results found. Medications: . heparin 1,000 Units/hr (03/17/18 0947)   . sodium chloride   Intravenous Once  . ezetimibe  10 mg Oral Daily  . feeding supplement  1 Container Oral TID BM  . feeding supplement (PRO-STAT SUGAR FREE 64)  30 mL Oral TID BM  . multivitamin with minerals  1 tablet Oral Daily  . pantoprazole  40 mg Oral BID  . rosuvastatin  40 mg Oral Daily  . Warfarin - Pharmacist Dosing Inpatient   Does not apply q1800   Assessment/Plan: **hyponatremia:  Mixed picture:  Hypervolemic globally but with low urine Na, mod urine Osm and hypotension that developed with diuresis yesterday + rising creatinine it appears that intravascular volume depletion is playing a role.  His severe hypoalbuminemia is most likely contributing and I question if his recurrent chylous ascites may be contributing as well.   This is certainly a challenging case.   --Hold diuretics, give albumin 25g x 2  today --Resend urine osm and urine sodium --Continue 2g sodium and 1243mL fluid restriction --Will follow closely with you  **AKI on CKD: baseline 2, creatinine peaked 3.6 12/27 and was improving gradually however today increased some to 2.7 presumably after diuresis yesterday.  --In light of hypotension and rising cr volume expansion with albumin today  **Acute blood loss anemia:  Hb 6.9 now to 9s with transfusion and GI clips.   -No indications for ESA   **A fib/mech MV: on heparin gtt as bridge to coumadin.    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  Unclear etiology.  He notes LE edema improves with LVPs making me question if there is impaired venous return due to vascular compression from the ascites.  This will make managing his LE edema challenging.   Jannifer Hick MD 03/17/2018, 10:27 AM  [facility] Pager: [phone]

## 2018-03-17 NOTE — Plan of Care (Signed)
  Problem: Education: Goal: Knowledge of General Education information will improve Description Including pain rating scale, medication(s)/side effects and non-pharmacologic comfort measures Outcome: Progressing   Problem: Education: Goal: Knowledge of General Education information will improve Description Including pain rating scale, medication(s)/side effects and non-pharmacologic comfort measures Outcome: Progressing   Problem: Clinical Measurements: Goal: Ability to maintain clinical measurements within normal limits will improve Outcome: Progressing   Problem: Nutrition: Goal: Adequate nutrition will be maintained Outcome: Progressing

## 2018-03-17 NOTE — Progress Notes (Signed)
   03/17/18 0520  Vitals  BP (!) 76/44  MAP (mmHg) (!) 54  BP Method Automatic  Pulse Rate 80    MD aware.

## 2018-03-17 NOTE — Progress Notes (Signed)
Was paged about patient's low BP ( 78/48). Repeat BP was 76/44 (54). Nurse stated he was asymptomatic and was stood up to measure standing weight and did not have complaints of dizziness. When I went to see patient he was resting comfortably in bed.  He immediately remembered my name though I have not seen him in a few days, so he does not seem to be altered.  BMP that was taken less than one hour before this BP was taken was not significantly different than the one taken this afternoon, though there was a slight bump in creatinine from 2.5 to 2.7.  Hgb remains above 9 since his last transfusion.  Patient was given lasix last night around 10pm which could explain his decrease in BP. He has put out 1.2 L of urine since that time.  He also had a low BP last night as well. Nephro's note submitted last night advised fluid and salt restriction given his overall hypervolemic state.  Therefore will hold off on a fluid bolus for now given patient's overall asymptomatic appearance and stable labs, most notably creatinine and hemoglobin.   He sodium also remains stable and low at 122.

## 2018-03-17 NOTE — Progress Notes (Signed)
Pt BP= 78/59. MD aware. MD verbal order for bedrest and to continue to monitor BP.

## 2018-03-17 NOTE — Progress Notes (Addendum)
Family Medicine Teaching Service Daily Progress Note Intern Pager: [phone]  Patient name: Mario Powell Medical record number: [id number] Date of birth: 1946-02-22 Age: 73 y.o. Gender: male  Primary Care Provider: Wilber Oliphant, MD Consultants: GI Code Status: Full code  Pt Overview and Major Events to Date:  Hospital Day 7 12/27 - transfused 2 units 12/28 - large volume paracentesis (9 liters) 12/29 - transfused 2 units 12/31 - EGD  Assessment and Plan: Mario Powell is a 73 y.o. male  being treated for pneumonia, chylous ascites and upper GI bleed. PMH is significant for gastric neuroendocrine polyps, history of mitral valve replacement with mechanical valve on chronic anticoagulation, HTN, prostate cancer s/p treatment, paroxysmal atrial fibrillation, anasarca of unknown source, CKD, normocytic anemia, hyponatremia, and chronic left pleural effusion.   Acute GI bleed.  Hgb 8.7>>6.0.>2 UpRBC>8.7>>6.9>2 units RBCs >9.7(12/31)>9.7(12/31pm)>9.2.  EGD 12/31 with nonbleeding gastric ulcers, clips placed during procedure.  GI recommends outpatient follow-up with Dr. [name] for history of gastric carcinoid and second opinion at [facility].  Heparin bridge to coumadin restarted 1/1.  Goal INR 2.5-3.5 for mechanical heart valve.  INR 1.46 this AM. - regular diet -Continue Heparin -continue coumadin -follow INR daily - Protonix 40 mg twice daily - monitor Hgb - Monitor melena - refer to [facility]  Hyponatremia: Chronic, acutely worsened   Na 130>>122(12/31)>121>122, baseline appears around 131-135.  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  Planned to hold Lasix yesterday, but received dose, likely as repeat Na in afternoon 121.  Serum osmolality 283. -Monitor CMP -Free water restriction, 1200 mL's, 2g sodium diet - nephro following, appreciate recs  Shortness of breath, multifactorial 2/2 ascites, chronic pleural effusion, LL Pneumonia, Anemia from GI bleed Large volume paracentesis 12/28, 9 liters.   Expect eventual reaccumulation.  Currently sating well on RA.  Down 5 lbs from yesterday. - holding spironolactone - hold lasix for now, per nephro recs - s/p Levaquin PO (12/26-1/1), started in the ED, for pneumonia coverage - Incentive spirometry - Consider thoracentesis for chronic left effusion - Oxygen therapy, wean as tolerated   Anasarca, associated with ascites: Acute on chronic. Improved with 9L removed with paracentesis 12/28. With 2+ pitting edema to LE to shins bilaterally. SAAG: 0.7 (unlikely to be from portal vein hypertension), LD 120, triglycerides 1,113, milky, WBCs 342. - holding lasix per nephro recs - holding spironolactone - Continued work-up with GI and PCP for exact etiology, may require lymphoscintigraphy or similar MR imaging for evaluation of obstrucion - refer to [facility] - refer to [facility] - GI has signed off  AKI on CKD:  Cr 3.42>>2.34>2.50>2.72, BL appears to be around 2.21.  Patient was recently started on spironolactone and furosemide on 11/14 by his PCP.  Holding spiro and lasix at present, but did received Lasix IV 40mg  last PM, likely cause of Cr bump this AM. - holding spironolactone - holding lasix - Avoid nephrotoxic medications as possible - Monitor fluid status closely - Strict I and O - nephro following, appreciate recs  Gastric neuroendocrine tumor: Acute, recurrent.  Previously diagnosed and biopsied with small margins via EGD in September.  However recently had an EGD on 12/20 with surgical pathology showing additional well differentiated neuroendocrine tumor extending to the edge of biopsy.  Follows with [facility] and Dr. Annamaria Boots with hematology/oncology. - To follow-up with Dr. [name] outpt - consider [facility] referral  History of mitral valve replacement with mechanical valve: Chronic, stable. Chronically on anticoagulation with warfarin.    EGD  completed today, will start heparin bridge to Coumadin with INR goal 2.5-3.5 per cardiology  recommendation.   -Heparin and Coumadin per pharmacy  Severe protein-calorie malnutrition: Chronic.  Albumin 1.8>2.1.  Family states he has a decreased appetite.  Likely also contributing to edema. - Nutrition consult -Boost breeze nutrition supplement 3 times daily, Protostat 3 times daily, multivitamin  Paroxysmal atrial fibrillation: Chronic, stable. Currently in sinus rhythm, HR 50-70.  On chronic warfarin therapy as above, not on any rate controlling regimen. - Monitor on telemetry  History of prostate cancer: Stable.  Patient reports this was 2 decades ago, s/p treatment.  No current concerns. -Monitor outpatient  Chronic hypertension: Stable. On spiro and torsemide at home. BP has been more hypotensive last two days.  MAP 57 last PM, asymptomatic.  Held off on fluids as is fluid overloaded.  Did received Lasix last PM, which is likely cause.  Bp this AM 76/44, MAP 54, recheck 78/59 at 0940. This AM denies dizziness.  - Holding home diuretic therapy in the setting of AKI - holding off on fluid resuscitation given overall fluid status  Vitamin B12 deficiency: Chronic, stable.  Follows with Dr. Annamaria Boots outpatient for injections monthly, last injection on 11/20. - Consider vitamin B12 injection vs deferment to outpatient  Normocytic anemia: Chronic.  Previously megaloblastic, receiving B12 injections monthly.  Hgb stable this AM at 9.2, baseline around 9-10.  No bleeding noted on EGD, no overt signs of bleeding on exam.    - Monitor CBC  FEN/GI:  Regular diet Prophylaxis:  Heparin bridge to warfarin  Disposition: bridge Hep to Warfarin, then likely d/c to CIR  Subjective:  Hypotension overnight following dose of Lasix.  Patient asymptomatic and did not receive fluids given overall fluid overload.  This AM continues to be hypotensive and asymptomatic. Patient without complaints.  Denies chest pain, SOB, denies bleeding, denies abdominal pain.  Objective: Temp:  [97.6 F  (36.4 C)-98.4 F (36.9 C)] 97.6 F (36.4 C) (01/02 0424) Pulse Rate:  [77-80] 80 (01/02 0520) Resp:  [16-18] 16 (01/02 0424) BP: (76-101)/(44-59) 76/44 (01/02 0520) SpO2:  [98 %-100 %] 99 % (01/02 0424) Weight:  [77.2 kg] 77.2 kg (01/02 0424) Intake/Output 01/01 0701 - 01/02 0700 In: 1062.1 [P.O.:840; I.V.:222.1] Out: 2100 [Urine:2100]  Physical Exam: General: 73 y.o. male in NAD, sitting up eating breakfast Cardio: RRR no m/r/g Lungs: CTAB, no wheezing, no rhonchi, no crackles Abdomen: Soft, non-tender to palpation, positive bowel sounds Skin: warm and dry Extremities: 2+ bilateral lower extremity edema to thigh   Laboratory: Recent Labs  Lab 03/15/18 0448 03/15/18 2012 03/17/18 0358  WBC 5.3 5.9 5.8  HGB 9.7* 9.7* 9.2*  HCT 27.8* 28.2* 27.6*  PLT 240 268 229   Recent Labs  Lab 03/10/18 1408 03/11/18 0607 03/11/18 1621 03/12/18 0346  03/16/18 0453 03/16/18 1620 03/17/18 0358  NA 130* 129*  --  131*   < > 122* 121* 122*  K 5.3* 5.4*  --  4.9   < > 4.9 4.7 4.5  CL 98 96*  --  97*   < > 90* 92* 91*  CO2 21* 24  --  24   < > 28 21* 24  BUN 70* 83*  --  93*   < > 80* 96* 78*  CREATININE 3.42* 3.44*  --  3.59*   < > 2.55* 2.50* 2.72*  CALCIUM 8.8* 8.3*  --  8.4*   < > 7.6* 7.5* 7.6*  PROT 5.4* 4.2* 4.8* 4.7*  --   --   --   --  BILITOT 0.9 0.5  --  0.5  --   --   --   --   ALKPHOS 61 45  --  54  --   --   --   --   ALT 29 21  --  22  --   --   --   --   AST 35 22  --  26  --   --   --   --   GLUCOSE 150* 128*  --  129*   < > 129* 123* 126*   < > = values in this interval not displayed.    Imaging/Diagnostic Tests: EGD 12/31 - nonbleeding gastric ulcers, no acute bleed present  Cleophas Dunker, DO 03/17/2018, 7:36 AM PGY-2, [place] Intern pager: [phone], text pages welcome

## 2018-03-17 NOTE — Progress Notes (Signed)
FMTS Brief Note Patient seen in PM to check in. Noted to be placed on [place] during morning hours. Denies dyspnea. Lungs with diminished breath sounds at left lung base. No desaturation recorded.   Oxygen Use, will discuss O2 goal---no desaturation noted.   Recurrent Chylous ascites, discussed recommendations for further evaluation at length with patient. He would like evaluation as outpatient. Scheduled with Dr. Jacqulynn Cadet at [place] at [address] on January 14th at 12:45 PM. Arrive at 12:30.   Patient wishes to leave hospital and reports frustration regarding lengthy stay----wishes to discuss further in AM. Palliative saw patient earlier in hospital stay---may benefit from further conversation.   [name], MD  Family Medicine Teaching Service

## 2018-03-18 ENCOUNTER — Telehealth: Payer: Self-pay | Admitting: Family Medicine

## 2018-03-18 LAB — BASIC METABOLIC PANEL
Anion gap: 7 (ref 5–15)
BUN: 72 mg/dL — ABNORMAL HIGH (ref 8–23)
CO2: 20 mmol/L — ABNORMAL LOW (ref 22–32)
Calcium: 7.9 mg/dL — ABNORMAL LOW (ref 8.9–10.3)
Chloride: 95 mmol/L — ABNORMAL LOW (ref 98–111)
Creatinine, Ser: 2.67 mg/dL — ABNORMAL HIGH (ref 0.61–1.24)
GFR calc Af Amer: 26 mL/min — ABNORMAL LOW (ref >60)
GFR calc non Af Amer: 23 mL/min — ABNORMAL LOW (ref >60)
Glucose, Bld: 116 mg/dL — ABNORMAL HIGH (ref 70–99)
Potassium: 4.4 mmol/L (ref 3.5–5.1)
Sodium: 122 mmol/L — ABNORMAL LOW (ref 135–145)

## 2018-03-18 LAB — CBC
HCT: 26.6 % — ABNORMAL LOW (ref 39.0–52.0)
Hemoglobin: 8.9 g/dL — ABNORMAL LOW (ref 13.0–17.0)
MCH: 30.9 pg (ref 26.0–34.0)
MCHC: 33.5 g/dL (ref 30.0–36.0)
MCV: 92.4 fL (ref 80.0–100.0)
Platelets: 211 10*3/uL (ref 150–400)
RBC: 2.88 MIL/uL — ABNORMAL LOW (ref 4.22–5.81)
RDW: 17.3 % — ABNORMAL HIGH (ref 11.5–15.5)
WBC: 5.3 10*3/uL (ref 4.0–10.5)
nRBC: 0 % (ref 0.0–0.2)

## 2018-03-18 LAB — PROTIME-INR
INR: 1.47
PROTHROMBIN TIME: 17.7 s — AB (ref 11.4–15.2)

## 2018-03-18 LAB — SODIUM, URINE, RANDOM: Sodium, Ur: 14 mmol/L

## 2018-03-18 LAB — ALBUMIN: Albumin: 2.1 g/dL — ABNORMAL LOW (ref 3.5–5.0)

## 2018-03-18 LAB — OSMOLALITY, URINE: Osmolality, Ur: 398 mOsm/kg (ref 300–900)

## 2018-03-18 LAB — HEPARIN LEVEL (UNFRACTIONATED)
Heparin Unfractionated: 0.24 IU/mL — ABNORMAL LOW (ref 0.30–0.70)
Heparin Unfractionated: 0.39 IU/mL (ref 0.30–0.70)

## 2018-03-18 MED ORDER — WARFARIN SODIUM 3 MG PO TABS
3.0000 mg | ORAL_TABLET | Freq: Once | ORAL | Status: AC
  Administered 2018-03-18: 3 mg via ORAL
  Filled 2018-03-18: qty 1

## 2018-03-18 MED ORDER — ALBUMIN HUMAN 5 % IV SOLN
25.0000 g | Freq: Four times a day (QID) | INTRAVENOUS | Status: AC
  Administered 2018-03-18 (×2): 25 g via INTRAVENOUS
  Filled 2018-03-18 (×2): qty 500

## 2018-03-18 MED ORDER — HEPARIN (PORCINE) 25000 UT/250ML-% IV SOLN
1500.0000 [IU]/h | INTRAVENOUS | Status: DC
  Administered 2018-03-18: 1150 [IU]/h via INTRAVENOUS
  Administered 2018-03-19: 1300 [IU]/h via INTRAVENOUS
  Administered 2018-03-20 (×2): 1600 [IU]/h via INTRAVENOUS
  Administered 2018-03-21 – 2018-03-22 (×2): 1500 [IU]/h via INTRAVENOUS
  Filled 2018-03-18 (×5): qty 250

## 2018-03-18 NOTE — Progress Notes (Signed)
Educated pt and daughter in set up and multiple uses of 3 in 1 and home safety, preparing pt's living space on first level of house. Pt able to don socks without assist at EOB, ambulate with supervision and rollator with cues for safety/to lock brakes and perform transfer to 3 in 1 over toilet with supervision. Pt is progressing well.  03/18/18 1600  OT Visit Information  Last OT Received On 03/18/18  Assistance Needed +1  OT goals addressed during session ADL's and self-care  History of Present Illness Pt is a 73 y.o. male admitted 03/10/18 with SOB and darkened stools. Imaging showed ascites with L pleural effusion and atelectasis. S/p paracentesis 12/28. Pt with hyponatremia and AKI on CKD. S/p EGD 12/31. PMH includes a fib on Coumadin, CAD, HTN, MVR, anemia, prostate CA.  Precautions  Precautions Fall  Precaution Comments Swollen painful scrotum  Pain Assessment  Pain Assessment Faces  Faces Pain Scale 4  Pain Location Scrotum  Pain Descriptors / Indicators Grimacing;Sore  Pain Intervention(s) Monitored during session;Repositioned  Cognition  Arousal/Alertness Awake/alert  Behavior During Therapy WFL for tasks assessed/performed  Overall Cognitive Status Impaired/Different from baseline  Area of Impairment Problem solving;Safety/judgement  Safety/Judgement Decreased awareness of safety  Problem Solving Slow processing  General Comments HOH. Mostly WFL for simple tasks, sometimes slow to get answers out and provide information  ADL  Overall ADL's  Needs assistance/impaired  Lower Body Bathing Min guard;Sit to/from stand  Upper Body Dressing  Set up;Sitting  Lower Body Dressing Set up;Sitting/lateral leans  Toilet Transfer Supervision/safety;Ambulation;RW;BSC (over toilet)  Toileting- Clothing Manipulation and Hygiene Minimal assistance;Sit to/from stand  Functional mobility during ADLs Supervision/safety;Rolling walker  General ADL Comments spoke to pt and family about setting up  home so he can avoid stairs when family is not at home  Bed Mobility  Bed Mobility Supine to Sit  Supine to sit Supervision;HOB elevated  General bed mobility comments Pulling on bed rails; no physical assist required  Balance  Overall balance assessment Needs assistance  Sitting balance-Leahy Scale Good  Standing balance support No upper extremity supported;During functional activity  Standing balance-Leahy Scale Fair  Standing balance comment Can static stand without UE support; dynamic stability improved with support  Restrictions  Weight Bearing Restrictions No  Transfers  Overall transfer level Needs assistance  Equipment used 4-wheeled walker  Transfers Sit to/from Stand  Sit to Stand Supervision  General transfer comment Educ on locking rollator brakes prior to standing. Stood from bed and rollator seat with supervision, good ability to lock/unlock brakes without additional cues needed  OT - End of Session  Equipment Utilized During Treatment Gait belt;Rolling walker  Activity Tolerance Patient tolerated treatment well  Patient left Other (comment);with call bell/phone within reach;with family/visitor present (left on commode)  Nurse Communication Other (comment) (aware pt is on toilet, IV alarming)  OT Assessment/Plan  OT Plan Discharge plan needs to be updated  OT Visit Diagnosis Unsteadiness on feet (R26.81);Muscle weakness (generalized) (M62.81)  OT Frequency (ACUTE ONLY) Min 2X/week  Follow Up Recommendations Home health OT  OT Equipment 3 in 1 bedside commode  AM-PAC OT "6 Clicks" Daily Activity Outcome Measure (Version 2)  Help from another person eating meals? 4  Help from another person taking care of personal grooming? 3  Help from another person toileting, which includes using toliet, bedpan, or urinal? 3  Help from another person bathing (including washing, rinsing, drying)? 3  Help from another person to put on and taking off regular  upper body clothing? 3   Help from another person to put on and taking off regular lower body clothing? 3  6 Click Score 19  OT Goal Progression  Progress towards OT goals Progressing toward goals  Acute Rehab OT Goals  Patient Stated Goal to get stronger and back to my normal  OT Goal Formulation With patient  Time For Goal Achievement 03/27/18  Potential to Achieve Goals Good  OT Time Calculation  OT Start Time (ACUTE ONLY) 1504  OT Stop Time (ACUTE ONLY) 1520  OT Time Calculation (min) 16 min  OT General Charges  $OT Visit 1 Visit  OT Treatments  $Self Care/Home Management  8-22 mins  Nestor Lewandowsky, OTR/L Acute Rehabilitation Services Pager: [phone] Office: [phone]

## 2018-03-18 NOTE — Progress Notes (Addendum)
[place] for heparin and warfarin Indication: heparin bridge while INR < 2.5  Allergies  Allergen Reactions  . Penicillins Other (See Comments)    DID THE REACTION INVOLVE: Swelling of the face/tongue/throat, SOB, or low BP? Unknown Sudden or severe rash/hives, skin peeling, or the inside of the mouth or nose? Unknown Did it require medical treatment? Yes When did it last happen?When pt was 73 years old Pt knows that his parents had to take him to the ER at the hospital and has not been retested with any PCN or cephalosporin that he is aware of.    Patient Measurements: Height: 5\' 11"  (180.3 cm) Weight: 171 lb 3.2 oz (77.7 kg)(scale a) IBW/kg (Calculated) : 75.3 Heparin Dosing Weight: 77.7 kg (actual body weight)  Vital Signs: Temp: 98.3 F (36.8 C) (01/03 0534) Temp Source: Oral (01/03 0534) BP: 95/59 (01/03 0534) Pulse Rate: 76 (01/03 0534)  Labs: Recent Labs    03/15/18 2012 03/16/18 0453 03/16/18 0917 03/16/18 1620 03/17/18 0358 03/18/18 0512  HGB 9.7*  --   --   --  9.2* 8.9*  HCT 28.2*  --   --   --  27.6* 26.6*  PLT 268  --   --   --  229 211  LABPROT  --  19.3*  --   --  17.6* 17.7*  INR  --  1.65  --   --  1.46 1.47  HEPARINUNFRC  --  0.13* 0.33  --  0.52 0.24*  CREATININE 2.45* 2.55*  --  2.50* 2.72* 2.67*    Estimated Creatinine Clearance: 25.9 mL/min (A) (by C-G formula based on SCr of 2.67 mg/dL (H)).   Medical History: Past Medical History:  Diagnosis Date  . Anasarca 12/01/2017  . Atrial fibrillation ([place]) 07/12/2013   Perioperative, 2001   . CAD (coronary artery disease) of artery bypass graft 07/12/2013   Saphenous vein graft 2001   . Chronic anticoagulation 07/12/2013  . Essential hypertension 07/12/2013  . History of mitral valve replacement with mechanical valve 01/11/2013   Mechanical mitral valve 2001  Bacterial endocarditis as the cause   . HOH (hard of hearing)   . Prostate cancer ([place]) 07/12/2013     Medications:  Scheduled:  . sodium chloride   Intravenous Once  . ezetimibe  10 mg Oral Daily  . feeding supplement  1 Container Oral BID BM  . feeding supplement (PRO-STAT SUGAR FREE 64)  30 mL Oral TID BM  . multivitamin with minerals  1 tablet Oral Daily  . pantoprazole  40 mg Oral BID  . rosuvastatin  40 mg Oral Daily  . Warfarin - Pharmacist Dosing Inpatient   Does not apply q1800    Assessment: 52 yoM admitted 12/26 with complains of shortness of breath and dark stools. PTA patient is on warfarin for mechanical MVR and atrial fibrillation. Warfarin held from 12/27-12/30 (last dose 12/27 at 0135) since patient had a GI bleed requiring transfusion. Warfarin resumed 12/31. Patient's INR subtherapeutic on 03/15/18 ,thus pharmacy was consulted to initiate heparin bridge until therapeutic INR.   Heparin level this morning dropped to 0.24 on same heparin rate of 1000 units/hr.  RN reports heparin infusing without issues or interruptions.  INR today is 1.47  remains subtherapeutic. INR has decreased despite potential increase in warfarin effect from the  levofloxacin 12/26 thru 1/1. Now off levaquin x 2 days.  Hgb 9.2>8.9 and pltc wnl.  No bleeding noted  PTA warfarin dose: 1mg  daily  except for 0.5mg  on M/W/F.  INR therapeutic on admit on home dose.      Goal of Therapy:  Heparin level 0.3-0.7 units/ml INR 2.5 - 3.5 Monitor platelets by anticoagulation protocol: Yes   Plan:  Increase heparin infusion to 1150 units/hr  Check 6 hr heparin level Monitor daily heparin level, CBC Warfarin 3 mg po x 1 tonight  Monitor daily INR, monitor for s/sx of bleeding.  Thank you for allowing pharmacy to be a part of this patient's care.  Nicole Cella, RPh Clinical Pharmacist Please check AMION for all [place] phone numbers After 10:00 PM, call [place] [phone] 03/18/2018       8:34 AM

## 2018-03-18 NOTE — Plan of Care (Signed)
  Problem: Health Behavior/Discharge Planning: Goal: Ability to manage health-related needs will improve Outcome: Progressing   Problem: Nutrition: Goal: Adequate nutrition will be maintained Outcome: Progressing   Problem: Elimination: Goal: Will not experience complications related to bowel motility Outcome: Progressing   Problem: Pain Managment: Goal: General experience of comfort will improve Outcome: Progressing   

## 2018-03-18 NOTE — Progress Notes (Signed)
[place] for heparin and warfarin Indication: heparin bridge while INR < 2.5  Allergies  Allergen Reactions  . Penicillins Other (See Comments)    DID THE REACTION INVOLVE: Swelling of the face/tongue/throat, SOB, or low BP? Unknown Sudden or severe rash/hives, skin peeling, or the inside of the mouth or nose? Unknown Did it require medical treatment? Yes When did it last happen?When pt was 73 years old Pt knows that his parents had to take him to the ER at the hospital and has not been retested with any PCN or cephalosporin that he is aware of.    Patient Measurements: Height: 5\' 11"  (180.3 cm) Weight: 171 lb 3.2 oz (77.7 kg)(scale a) IBW/kg (Calculated) : 75.3 Heparin Dosing Weight: 77.7 kg (actual body weight)  Vital Signs: Temp: 97.8 F (36.6 C) (01/03 1257) Temp Source: Oral (01/03 1257) BP: 87/56 (01/03 1257) Pulse Rate: 80 (01/03 1257)  Labs: Recent Labs    03/15/18 2012 03/16/18 0453  03/16/18 1620 03/17/18 0358 03/18/18 0512 03/18/18 1705  HGB 9.7*  --   --   --  9.2* 8.9*  --   HCT 28.2*  --   --   --  27.6* 26.6*  --   PLT 268  --   --   --  229 211  --   LABPROT  --  19.3*  --   --  17.6* 17.7*  --   INR  --  1.65  --   --  1.46 1.47  --   HEPARINUNFRC  --  0.13*   < >  --  0.52 0.24* 0.39  CREATININE 2.45* 2.55*  --  2.50* 2.72* 2.67*  --    < > = values in this interval not displayed.    Estimated Creatinine Clearance: 25.9 mL/min (A) (by C-G formula based on SCr of 2.67 mg/dL (H)).   Medical History: Past Medical History:  Diagnosis Date  . Anasarca 12/01/2017  . Atrial fibrillation ([place]) 07/12/2013   Perioperative, 2001   . CAD (coronary artery disease) of artery bypass graft 07/12/2013   Saphenous vein graft 2001   . Chronic anticoagulation 07/12/2013  . Essential hypertension 07/12/2013  . History of mitral valve replacement with mechanical valve 01/11/2013   Mechanical mitral valve 2001  Bacterial  endocarditis as the cause   . HOH (hard of hearing)   . Prostate cancer ([place]) 07/12/2013    Medications:  Scheduled:  . sodium chloride   Intravenous Once  . ezetimibe  10 mg Oral Daily  . feeding supplement  1 Container Oral BID BM  . feeding supplement (PRO-STAT SUGAR FREE 64)  30 mL Oral TID BM  . multivitamin with minerals  1 tablet Oral Daily  . pantoprazole  40 mg Oral BID  . rosuvastatin  40 mg Oral Daily  . Warfarin - Pharmacist Dosing Inpatient   Does not apply q1800    Assessment: 33 yoM admitted 12/26 with complains of shortness of breath and dark stools. PTA patient is on warfarin for mechanical MVR and atrial fibrillation. Warfarin held from 12/27-12/30 (last dose 12/27 at 0135) since patient had a GI bleed requiring transfusion. Warfarin resumed 12/31. Patient's INR subtherapeutic on 03/15/18 ,thus pharmacy was consulted to initiate heparin bridge until therapeutic INR.   Heparin level this evening is at goal at 0.39 on 1150 units/hr. No issues noted.   PTA warfarin dose: 1mg  daily except for 0.5mg  on M/W/F.  INR therapeutic on admit on home dose.  Goal of Therapy:  Heparin level 0.3-0.7 units/ml INR 2.5 - 3.5 Monitor platelets by anticoagulation protocol: Yes   Plan:  Continue heparin infusion at 1150 units/hr  Monitor daily heparin level, CBC Warfarin 3 mg po x 1 tonight  Monitor daily INR, monitor for s/sx of bleeding.  Thank you for allowing pharmacy to be a part of this patient's care.  Erin Hearing PharmD., BCPS Clinical Pharmacist 03/18/2018 6:24 PM  Please check AMION for all [place] phone numbers After 10:00 PM, call [place] [phone] 03/18/2018       6:23 PM

## 2018-03-18 NOTE — Progress Notes (Signed)
Physical Therapy Treatment Patient Details Name: Mario Powell MRN: [id number] DOB: December 11, 1945 Today's Date: 03/18/2018    History of Present Illness Pt is a 73 y.o. male admitted 03/10/18 with SOB and darkened stools. Imaging showed ascites with L pleural effusion and atelectasis. S/p paracentesis 12/28. Pt with hyponatremia and AKI on CKD. S/p EGD 12/31. PMH includes a fib on Coumadin, CAD, HTN, MVR, anemia, prostate CA.   PT Comments    Pt progressing well with mobility. Able to transfer and ambulate with rollator at supervision-level; rollator adjusted to pt's height and pt educated on brakes/safety using it. Daughter present; increased time spent discussing home set-up and amount of support available, pt and daughter receptive to suggestions made for safety and fall risk reduction. Pt motivated to return home. Recommend HHPT services to maximize functional mobility and independence.    Follow Up Recommendations  Home health PT;Supervision for mobility/OOB     Equipment Recommendations  (rollator and 3-in-1 delivered to room)    Recommendations for Other Services       Precautions / Restrictions Precautions Precautions: Fall Precaution Comments: Swollen painful scrotum Restrictions Weight Bearing Restrictions: No    Mobility  Bed Mobility Overal bed mobility: Needs Assistance Bed Mobility: Supine to Sit     Supine to sit: Supervision;HOB elevated     General bed mobility comments: Pulling on bed rails; no physical assist required  Transfers Overall transfer level: Needs assistance Equipment used: 4-wheeled walker Transfers: Sit to/from Stand Sit to Stand: Supervision         General transfer comment: Educ on locking rollator brakes prior to standing. Stood from bed and rollator seat with supervision, good ability to lock/unlock brakes without additional cues needed  Ambulation/Gait Ambulation/Gait assistance: Supervision Gait Distance (Feet): 350  Feet Assistive device: 4-wheeled walker Gait Pattern/deviations: Step-through pattern;Decreased stride length;Trunk flexed Gait velocity: Decreased Gait velocity interpretation: 1.31 - 2.62 ft/sec, indicative of limited community ambulator General Gait Details: Slow, steady amb with rollator, requiring 1x seated rest break secondary to fatigue. Able to increase gait speed significantly last 60' of walk   Stairs             Wheelchair Mobility    Modified Rankin (Stroke Patients Only)       Balance Overall balance assessment: Needs assistance Sitting-balance support: No upper extremity supported;Feet supported Sitting balance-Leahy Scale: Good     Standing balance support: No upper extremity supported;During functional activity Standing balance-Leahy Scale: Fair Standing balance comment: Can static stand without UE support; dynamic stability improved with support                            Cognition Arousal/Alertness: Awake/alert Behavior During Therapy: WFL for tasks assessed/performed Overall Cognitive Status: Impaired/Different from baseline Area of Impairment: Problem solving;Safety/judgement                         Safety/Judgement: Decreased awareness of safety   Problem Solving: Slow processing General Comments: HOH. Mostly WFL for simple tasks, sometimes slow to get answers out and provide information      Exercises      General Comments General comments (skin integrity, edema, etc.): Daughter present and supportive. Increased time spent discussing home set-up and support available. Pt has one flight into house to main level (w/ rail) and additional flight up to bedroom/shower; pt likely planning to live on main level; discussed buying additional RW or rollator so  that pt can have device on both levels. Reviewed BSC set-up. Rollator adjusted to pt's height      Pertinent Vitals/Pain Pain Assessment: Faces Faces Pain Scale: Hurts  little more Pain Location: Scrotum Pain Descriptors / Indicators: Aching;Grimacing;Discomfort Pain Intervention(s): Monitored during session;Limited activity within patient's tolerance    Home Living                      Prior Function            PT Goals (current goals can now be found in the care plan section) Acute Rehab PT Goals Patient Stated Goal: to get stronger and back to my normal PT Goal Formulation: With patient/family Time For Goal Achievement: 03/27/18 Potential to Achieve Goals: Good Progress towards PT goals: Progressing toward goals    Frequency    Min 3X/week      PT Plan Discharge plan needs to be updated    Co-evaluation              AM-PAC PT "6 Clicks" Mobility   Outcome Measure  Help needed turning from your back to your side while in a flat bed without using bedrails?: None Help needed moving from lying on your back to sitting on the side of a flat bed without using bedrails?: None Help needed moving to and from a bed to a chair (including a wheelchair)?: A Little Help needed standing up from a chair using your arms (e.g., wheelchair or bedside chair)?: A Little Help needed to walk in hospital room?: A Little Help needed climbing 3-5 steps with a railing? : A Lot 6 Click Score: 19    End of Session Equipment Utilized During Treatment: Gait belt Activity Tolerance: Patient tolerated treatment well Patient left: with call bell/phone within reach;with family/visitor present(using bathroom) Nurse Communication: Mobility status PT Visit Diagnosis: Muscle weakness (generalized) (M62.81);Difficulty in walking, not elsewhere classified (R26.2);Other abnormalities of gait and mobility (R26.89)     Time: 3005-1102 PT Time Calculation (min) (ACUTE ONLY): 23 min  Charges:  $Gait Training: 8-22 mins $Self Care/Home Management: [place], PT, DPT Acute Rehabilitation Services  Pager [phone] Office  [place] 03/18/2018, 3:37 PM

## 2018-03-18 NOTE — Progress Notes (Signed)
[facility] Progress Note   Subjective:   Denies new complaints this AM.  No HA, confusion. Expressing desire for discharge.   Serum sodium still 122 this AM.  Rec'd albumin 25g yesterday. I/Os 483 / 2.1L.  No diuretics were given.   Daughter at bedside and long discussion had with her today.   Objective Vitals:   03/17/18 1211 03/17/18 2023 03/17/18 2206 03/18/18 0534  BP: (!) 83/56 110/72 111/66 (!) 95/59  Pulse: 74 73 75 76  Resp:  18  18  Temp: (!) 97.4 F (36.3 C) 98.9 F (37.2 C)  98.3 F (36.8 C)  TempSrc: Oral Oral  Oral  SpO2: 100% 100%  98%  Weight:    77.7 kg  Height:       Physical Exam General: chronically ill appearing but eating breakfast with vigor HEENT: glasses, MMM Eyes: anicteric, glasses Neck: supple, JVD does not appear elevated Heart: RRR, mitral click Lungs: normal WOB, clear ant, dec BS in bases, no rales or wheeze s Abdomen: soft, mildly distended, nontender Extremities: 2+ pitting to knees and dependent sacral area - unchanged compared to yesterday Skin: no rashes Neuro: dysarthric at times, unchanged (per family chronic); very hard of hearing.   Additional Objective Labs: Basic Metabolic Panel: Recent Labs  Lab 03/16/18 1620 03/17/18 0358 03/18/18 0512  NA 121* 122* 122*  K 4.7 4.5 4.4  CL 92* 91* 95*  CO2 21* 24 20*  GLUCOSE 123* 126* 116*  BUN 96* 78* 72*  CREATININE 2.50* 2.72* 2.67*  CALCIUM 7.5* 7.6* 7.9*   Liver Function Tests: Recent Labs  Lab 03/11/18 1621 03/12/18 0346  AST  --  26  ALT  --  22  ALKPHOS  --  54  BILITOT  --  0.5  PROT 4.8* 4.7*  ALBUMIN 2.1* 2.1*   No results for input(s): LIPASE, AMYLASE in the last 168 hours. CBC: Recent Labs  Lab 03/14/18 0414 03/15/18 0448 03/15/18 2012 03/17/18 0358 03/18/18 0512  WBC 4.8 5.3 5.9 5.8 5.3  NEUTROABS 3.7  --   --   --   --   HGB 9.3* 9.7* 9.7* 9.2* 8.9*  HCT 28.0* 27.8* 28.2* 27.6* 26.6*  MCV 93.0 91.4 92.8 91.1 92.4  PLT 204 240 268  229 211   Blood Culture    Component Value Date/Time   SDES PERITONEAL 03/12/2018 1145   SDES PERITONEAL 03/12/2018 1145   SPECREQUEST NONE 03/12/2018 1145   SPECREQUEST NONE 03/12/2018 1145   CULT  03/12/2018 1145    NO GROWTH 5 DAYS Performed at [facility], [place] [address] [place], [place] [address]    REPTSTATUS 03/17/2018 FINAL 03/12/2018 1145   REPTSTATUS 03/12/2018 FINAL 03/12/2018 1145    Cardiac Enzymes: No results for input(s): CKTOTAL, CKMB, CKMBINDEX, TROPONINI in the last 168 hours. CBG: No results for input(s): GLUCAP in the last 168 hours. Iron Studies: No results for input(s): IRON, TIBC, TRANSFERRIN, FERRITIN in the last 72 hours. @lablastinr3 @ Studies/Results: No results found. Medications: . heparin 1,150 Units/hr (03/18/18 0937)   . sodium chloride   Intravenous Once  . ezetimibe  10 mg Oral Daily  . feeding supplement  1 Container Oral BID BM  . feeding supplement (PRO-STAT SUGAR FREE 64)  30 mL Oral TID BM  . multivitamin with minerals  1 tablet Oral Daily  . pantoprazole  40 mg Oral BID  . rosuvastatin  40 mg Oral Daily  . warfarin  3 mg Oral ONCE-1800  .  Warfarin - Pharmacist Dosing Inpatient   Does not apply q1800   Assessment/Plan: **hyponatremia:  Mixed picture:  Hypervolemic globally but with low urine Na, mod urine Osm and hypotension + rising creatinine that developed with diuresis on 1/1.  It appears that intravascular volume depletion is playing a large role.  His urine sodium is low and urine osms in mid range.   His severe hypoalbuminemia is most likely contributing as is his recurrent chylous ascites may be contributing as well.   This is certainly a challenging case.   --Holding diuretics, give albumin 25g x 2 today --Added on serum albumin to this AMs labs --Continue 2g sodium and 1265mL fluid restriction --Protein supplement diet as able --Will follow closely with you. I don't think he should be discharged until his serum sodium  is at least improving.  I have expressed this to he and family today.   **AKI on CKD: baseline 2, creatinine peaked 3.6 12/27 and was improving gradually with small rise after diuresis 1/1 to 2.7 yesterday.  Today 2.67.  Attempting volume expansion with albumin per above.   **Chylous ascites: recurrent, requiring LVPs.  Unclear etiology.  He notes LE edema improves with LVPs making me question if there is impaired venous return due to vascular compression from the ascites.  This will make managing his LE edema challenging.   **Acute blood loss anemia:  Hb 6.9 now to 9s with transfusion and GI clips.   -No indications for ESA   **A fib/mech MV: on heparin gtt as bridge to coumadin.    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

## 2018-03-18 NOTE — Progress Notes (Signed)
Family Medicine Teaching Service Daily Progress Note Intern Pager: [phone]  Patient name: Mario Powell Medical record number: [id number] Date of birth: 05-27-1945 Age: 73 y.o. Gender: male  Primary Care Provider: Wilber Oliphant, MD Consultants: GI Code Status: Full code  Pt Overview and Major Events to Date:  Hospital Day 8 12/27 - transfused 2 units 12/28 - large volume paracentesis (9 liters) 12/29 - transfused 2 units 12/31 - EGD  Assessment and Plan: FED CECI is a 73 y.o. male  being treated for pneumonia, chylous ascites and upper GI bleed. PMH is significant for gastric neuroendocrine polyps, history of mitral valve replacement with mechanical valve on chronic anticoagulation, HTN, prostate cancer s/p treatment, paroxysmal atrial fibrillation, anasarca of unknown source, CKD, normocytic anemia, hyponatremia, and chronic left pleural effusion.   Acute GI bleed.  Hgb 8.7>>6.0.>2 UpRBC>8.7>>6.9>2 units RBCs >9.7(12/31)>9.7(12/31pm)>9.2>8.9.  EGD 12/31 with nonbleeding gastric ulcers, clips placed during procedure.  GI recommends outpatient follow-up with Dr. [name] for history of gastric carcinoid and second opinion at [facility].  Heparin bridge to coumadin restarted 1/1.  Goal INR 2.5-3.5 for mechanical heart valve.  INR this AM 1.47. - regular diet -Continue Heparin -continue coumadin -follow INR daily - Protonix 40 mg twice daily - monitor Hgb - Monitor melena - referral placed to [facility]  Hyponatremia: Chronic, acutely worsened   Baseline appears around 131-135, Na now stable at 122 for past 3 days.  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  Holding lasix per nephro recommendations. Given albumin 25g x2 yesterday. -Monitor CMP -Free water restriction, 1200 mL's, 2g sodium diet - nephro following, appreciate recs  Shortness of breath, multifactorial 2/2 ascites, chronic pleural effusion, LL Pneumonia, Anemia from GI bleed Large volume paracentesis 12/28, 9 liters.  Expect eventual  reaccumulation.  Currently sating well on RA.  Weight up about 1 lb from yesterday, received albumin yesterday. - holding spironolactone - hold lasix for now, per nephro recs - s/p Levaquin PO (12/26-1/1), started in the ED, for pneumonia coverage - Incentive spirometry - Consider thoracentesis for chronic left effusion - Oxygen therapy, wean as tolerated   Anasarca, associated with ascites: Acute on chronic. Improved with 9L removed with paracentesis 12/28. With 2+ pitting edema to LE to shins bilaterally. SAAG: 0.7 (unlikely to be from portal vein hypertension), LD 120, triglycerides 1,113, milky, WBCs 342. - holding lasix per nephro recs - holding spironolactone - Continued work-up with GI and PCP for exact etiology, may require lymphoscintigraphy or similar MR imaging for evaluation of obstrucion - referral placed to [facility] - referral placed to [facility], will make appointment - GI has signed off  AKI on CKD:  Cr 3.42>>2.34>2.50>2.72>2.67, BL appears to be around 2.21.  Patient was recently started on spironolactone and furosemide on 11/14 by his PCP.  Holding spiro and lasix at present.  Received albumin 25g x2 yesterday. - holding spironolactone - holding lasix - Avoid nephrotoxic medications as possible - Monitor fluid status closely - Strict I and O - nephro following, appreciate recs  Gastric neuroendocrine tumor: Acute, recurrent.  Previously diagnosed and biopsied with small margins via EGD in September.  However recently had an EGD on 12/20 with surgical pathology showing additional well differentiated neuroendocrine tumor extending to the edge of biopsy.  Follows with [facility] and Dr. Annamaria Boots with hematology/oncology. - To follow-up with Dr. [name] outpt - referral placed to [facility]  History of mitral valve replacement with mechanical valve: Chronic, stable. Chronically on anticoagulation with warfarin.  EGD completed today, will start heparin bridge to Coumadin with  INR goal 2.5-3.5 per cardiology recommendation.   -Heparin and Coumadin per pharmacy  Severe protein-calorie malnutrition: Chronic.  Albumin 1.8>2.1.  Family states he has a decreased appetite.  Likely also contributing to edema.  Received albumin 25g x2 yesterday. - Nutrition consult -Boost breeze nutrition supplement 3 times daily, Protostat 3 times daily, multivitamin  Paroxysmal atrial fibrillation: Chronic, stable. Currently in sinus rhythm, HR 50-70.  On chronic warfarin therapy as above, not on any rate controlling regimen. - Monitor on telemetry  History of prostate cancer: Stable.  Patient reports this was 2 decades ago, s/p treatment.  No current concerns. -Monitor outpatient  Chronic hypertension: Stable. On spiro and torsemide at home. BP has been more hypotensive in the last three days, did not have hypotension overnight, BP this AM 95/59.  Received albumin 25g x2 yesterday. - Holding home diuretic therapy in the setting of AKI - holding off on fluid resuscitation given overall fluid status  Vitamin B12 deficiency: Chronic, stable.  Follows with Dr. Annamaria Boots outpatient for injections monthly, last injection on 11/20. - Consider vitamin B12 injection vs deferment to outpatient  Normocytic anemia: Chronic.  Previously megaloblastic, receiving B12 injections monthly.  Hgb stable at 8.9 today. baseline around 9-10.  No bleeding noted on EGD, no overt signs of bleeding on exam.    - Monitor CBC  FEN/GI:  Regular diet Prophylaxis:  Heparin bridge to warfarin  Disposition: bridge Hep to Warfarin, then likely d/c to home with [facility]  Subjective:  Patient feeling well this AM.  Continues to request that he can go home.   Objective: Temp:  [97.4 F (36.3 C)-98.9 F (37.2 C)] 98.3 F (36.8 C) (01/03 0534) Pulse Rate:  [73-76] 76 (01/03 0534) Resp:  [14-18] 18 (01/03 0534) BP: (78-111)/(56-72) 95/59 (01/03 0534) SpO2:  [98 %-100 %] 98 % (01/03 0534) Weight:  [77.7 kg]  77.7 kg (01/03 0534) Intake/Output 01/02 0701 - 01/03 0700 In: 483.2 [P.O.:240; I.V.:146.7; IV Piggyback:96.5] Out: 2150 [Urine:2150]  Physical Exam: General: 73 y.o. male in NAD Cardio: RRR no m/r/g Lungs: decreased breath sounds in LLL, otherwise clear and good airmovement Abdomen: Soft, non-tender to palpation, positive bowel sounds Skin: warm and dry Extremities: 1+ pitting edema to thigh BLE   Laboratory: Recent Labs  Lab 03/15/18 2012 03/17/18 0358 03/18/18 0512  WBC 5.9 5.8 5.3  HGB 9.7* 9.2* 8.9*  HCT 28.2* 27.6* 26.6*  PLT 268 229 211   Recent Labs  Lab 03/11/18 1621 03/12/18 0346  03/16/18 1620 03/17/18 0358 03/18/18 0512  NA  --  131*   < > 121* 122* 122*  K  --  4.9   < > 4.7 4.5 4.4  CL  --  97*   < > 92* 91* 95*  CO2  --  24   < > 21* 24 20*  BUN  --  93*   < > 96* 78* 72*  CREATININE  --  3.59*   < > 2.50* 2.72* 2.67*  CALCIUM  --  8.4*   < > 7.5* 7.6* 7.9*  PROT 4.8* 4.7*  --   --   --   --   BILITOT  --  0.5  --   --   --   --   ALKPHOS  --  54  --   --   --   --   ALT  --  22  --   --   --   --  AST  --  26  --   --   --   --   GLUCOSE  --  129*   < > 123* 126* 116*   < > = values in this interval not displayed.    Imaging/Diagnostic Tests: EGD 12/31 - nonbleeding gastric ulcers, no acute bleed present  Cleophas Dunker, DO 03/18/2018, 8:00 AM PGY-1, [place] Intern pager: [phone], text pages welcome

## 2018-03-18 NOTE — Telephone Encounter (Signed)
Copied from [place] [id number]. Topic: General - Other >> Mar 18, 2018  2:43 PM Keene Breath wrote: Reason for CRM: Rosann Auerbach at [facility] called to request the the CT Chest and abdominal scans be loaded to [employer] regarding the patient.  Please advise and call back at [phone]

## 2018-03-19 LAB — BASIC METABOLIC PANEL
Anion gap: 7 (ref 5–15)
BUN: 70 mg/dL — ABNORMAL HIGH (ref 8–23)
CO2: 22 mmol/L (ref 22–32)
Calcium: 7.8 mg/dL — ABNORMAL LOW (ref 8.9–10.3)
Chloride: 97 mmol/L — ABNORMAL LOW (ref 98–111)
Creatinine, Ser: 2.43 mg/dL — ABNORMAL HIGH (ref 0.61–1.24)
GFR calc Af Amer: 29 mL/min — ABNORMAL LOW (ref >60)
GFR calc non Af Amer: 25 mL/min — ABNORMAL LOW (ref >60)
Glucose, Bld: 122 mg/dL — ABNORMAL HIGH (ref 70–99)
Potassium: 4.6 mmol/L (ref 3.5–5.1)
SODIUM: 126 mmol/L — AB (ref 135–145)

## 2018-03-19 LAB — CBC
HCT: 23.8 % — ABNORMAL LOW (ref 39.0–52.0)
HCT: 25.8 % — ABNORMAL LOW (ref 39.0–52.0)
Hemoglobin: 7.7 g/dL — ABNORMAL LOW (ref 13.0–17.0)
Hemoglobin: 8.3 g/dL — ABNORMAL LOW (ref 13.0–17.0)
MCH: 30.3 pg (ref 26.0–34.0)
MCH: 30.2 pg (ref 26.0–34.0)
MCHC: 32.4 g/dL (ref 30.0–36.0)
MCHC: 32.2 g/dL (ref 30.0–36.0)
MCV: 93.7 fL (ref 80.0–100.0)
MCV: 93.8 fL (ref 80.0–100.0)
PLATELETS: 197 10*3/uL (ref 150–400)
Platelets: 199 10*3/uL (ref 150–400)
RBC: 2.54 MIL/uL — ABNORMAL LOW (ref 4.22–5.81)
RBC: 2.75 MIL/uL — AB (ref 4.22–5.81)
RDW: 17.3 % — ABNORMAL HIGH (ref 11.5–15.5)
RDW: 17.3 % — AB (ref 11.5–15.5)
WBC: 4 10*3/uL (ref 4.0–10.5)
WBC: 3.9 10*3/uL — ABNORMAL LOW (ref 4.0–10.5)
nRBC: 0 % (ref 0.0–0.2)
nRBC: 0 % (ref 0.0–0.2)

## 2018-03-19 LAB — HEPARIN LEVEL (UNFRACTIONATED)
HEPARIN UNFRACTIONATED: 0.27 [IU]/mL — AB (ref 0.30–0.70)
Heparin Unfractionated: 0.25 IU/mL — ABNORMAL LOW (ref 0.30–0.70)
Heparin Unfractionated: 0.28 IU/mL — ABNORMAL LOW (ref 0.30–0.70)

## 2018-03-19 LAB — CORTISOL: Cortisol, Plasma: 9.7 ug/dL

## 2018-03-19 LAB — PROTIME-INR
INR: 1.63
Prothrombin Time: 19.2 seconds — ABNORMAL HIGH (ref 11.4–15.2)

## 2018-03-19 MED ORDER — WARFARIN SODIUM 3 MG PO TABS
3.0000 mg | ORAL_TABLET | Freq: Once | ORAL | Status: AC
  Administered 2018-03-19: 3 mg via ORAL
  Filled 2018-03-19: qty 1

## 2018-03-19 MED ORDER — ALBUMIN HUMAN 25 % IV SOLN
25.0000 g | Freq: Four times a day (QID) | INTRAVENOUS | Status: AC
  Administered 2018-03-19 (×2): 25 g via INTRAVENOUS
  Filled 2018-03-19 (×3): qty 100

## 2018-03-19 NOTE — Progress Notes (Signed)
Patient was turned every two hours during shift, I educated him about getting out of bed and sitting in recliner but patient declined.

## 2018-03-19 NOTE — Progress Notes (Addendum)
[place] for heparin and warfarin Indication: heparin bridge while INR < 2.5  Allergies  Allergen Reactions  . Penicillins Other (See Comments)    DID THE REACTION INVOLVE: Swelling of the face/tongue/throat, SOB, or low BP? Unknown Sudden or severe rash/hives, skin peeling, or the inside of the mouth or nose? Unknown Did it require medical treatment? Yes When did it last happen?When pt was 73 years old Pt knows that his parents had to take him to the ER at the hospital and has not been retested with any PCN or cephalosporin that he is aware of.    Patient Measurements: Height: 5\' 11"  (180.3 cm) Weight: 168 lb 1.6 oz (76.2 kg) IBW/kg (Calculated) : 75.3 Heparin Dosing Weight: 77.7 kg (actual body weight)  Vital Signs: Temp: 98.3 F (36.8 C) (01/04 0459) Temp Source: Oral (01/04 0459) BP: 91/55 (01/04 0459) Pulse Rate: 78 (01/04 0459)  Labs: Recent Labs    03/17/18 0358 03/18/18 0512 03/18/18 1705 03/19/18 0443  HGB 9.2* 8.9*  --  7.7*  HCT 27.6* 26.6*  --  23.8*  PLT 229 211  --  197  LABPROT 17.6* 17.7*  --  19.2*  INR 1.46 1.47  --  1.63  HEPARINUNFRC 0.52 0.24* 0.39 0.25*  CREATININE 2.72* 2.67*  --  2.43*    Estimated Creatinine Clearance: 28.4 mL/min (A) (by C-G formula based on SCr of 2.43 mg/dL (H)).   Medical History: Past Medical History:  Diagnosis Date  . Anasarca 12/01/2017  . Atrial fibrillation ([place]) 07/12/2013   Perioperative, 2001   . CAD (coronary artery disease) of artery bypass graft 07/12/2013   Saphenous vein graft 2001   . Chronic anticoagulation 07/12/2013  . Essential hypertension 07/12/2013  . History of mitral valve replacement with mechanical valve 01/11/2013   Mechanical mitral valve 2001  Bacterial endocarditis as the cause   . HOH (hard of hearing)   . Prostate cancer ([place]) 07/12/2013    Medications:  Scheduled:  . sodium chloride   Intravenous Once  . ezetimibe  10 mg Oral Daily  .  feeding supplement  1 Container Oral BID BM  . feeding supplement (PRO-STAT SUGAR FREE 64)  30 mL Oral TID BM  . multivitamin with minerals  1 tablet Oral Daily  . pantoprazole  40 mg Oral BID  . rosuvastatin  40 mg Oral Daily  . Warfarin - Pharmacist Dosing Inpatient   Does not apply q1800    Assessment: 56 yoM admitted 12/26 with complains of shortness of breath and dark stools. PTA patient is on warfarin for mechanical MVR and atrial fibrillation. Warfarin held from 12/27-12/30 (last dose 12/27 at 0135) since patient had a GI bleed requiring transfusion. Warfarin resumed 12/31. Patient's INR subtherapeutic on 03/15/18 ,thus pharmacy was consulted to initiate heparin bridge until therapeutic INR.   Heparin level this morning is below goal at 0.25 on 1150 units/hr. No issues noted by nurse or patient. Hgb is 7.7 this am.  INR below goal at 1.63 this morning but is increasing appropriately.   PTA warfarin dose: 1mg  daily except for 0.5mg  on M/W/F.  INR therapeutic on admit on home dose.    Goal of Therapy:  Heparin level 0.3-0.7 units/ml INR 2.5 - 3.5 Monitor platelets by anticoagulation protocol: Yes   Plan:  Increase heparin infusion to 1300 units/hr   6hr heparin level Monitor daily heparin level, CBC Warfarin 3 mg po x 1 tonight  Monitor daily INR, monitor for s/sx of  bleeding.   Addendum: 1400 Heparin level still below goal at 0.27 at 1300 units/hr  Plan: Increase heparin infusion to 1450 units/hr   8hr heparin level Monitor daily heparin level, CBC  Thank you for allowing pharmacy to be a part of this patient's care.  Harrietta Guardian, PharmD PGY1 Pharmacy Resident 03/19/2018    3:02 PM Please check AMION for all [place] numbers

## 2018-03-19 NOTE — Progress Notes (Signed)
[facility] Progress Note   Subjective:   Denies new complaints this AM.  No HA, confusion.  Serum sodium 126 this AM.  Cortisol normal.  Rec'd albumin 25g x 2 yesterday. I/Os 2.6 / 1.45L.  No diuretics were given.   Daughter not present today.   Objective Vitals:   03/18/18 0534 03/18/18 1257 03/18/18 2029 03/19/18 0459  BP: (!) 95/59 (!) 87/56 (!) 102/59 (!) 91/55  Pulse: 76 80 76 78  Resp: 18 16 20 20   Temp: 98.3 F (36.8 C) 97.8 F (36.6 C) 98.2 F (36.8 C) 98.3 F (36.8 C)  TempSrc: Oral Oral Oral Oral  SpO2: 98% 99% 97% 98%  Weight: 77.7 kg   76.2 kg  Height:       Physical Exam General: chronically ill appearing but comfortable HEENT: glasses, MMM Eyes: anicteric, glasses Neck: supple, JVD does not appear elevated Heart: RRR, mitral click Lungs: normal WOB, clear ant, dec BS in bases, no rales or wheeze s Abdomen: soft, mildly distended, nontender Extremities: 2+ pitting to knees and dependent sacral area - unchanged compared to yesterday  Skin: no rashes Neuro: dysarthric at times, unchanged (per family chronic); very hard of hearing.   Additional Objective Labs: Basic Metabolic Panel: Recent Labs  Lab 03/17/18 0358 03/18/18 0512 03/19/18 0443  NA 122* 122* 126*  K 4.5 4.4 4.6  CL 91* 95* 97*  CO2 24 20* 22  GLUCOSE 126* 116* 122*  BUN 78* 72* 70*  CREATININE 2.72* 2.67* 2.43*  CALCIUM 7.6* 7.9* 7.8*   Liver Function Tests: Recent Labs  Lab 03/18/18 1000  ALBUMIN 2.1*   No results for input(s): LIPASE, AMYLASE in the last 168 hours. CBC: Recent Labs  Lab 03/14/18 0414 03/15/18 0448 03/15/18 2012 03/17/18 0358 03/18/18 0512 03/19/18 0443  WBC 4.8 5.3 5.9 5.8 5.3 4.0  NEUTROABS 3.7  --   --   --   --   --   HGB 9.3* 9.7* 9.7* 9.2* 8.9* 7.7*  HCT 28.0* 27.8* 28.2* 27.6* 26.6* 23.8*  MCV 93.0 91.4 92.8 91.1 92.4 93.7  PLT 204 240 268 229 211 197   Blood Culture    Component Value Date/Time   SDES PERITONEAL 03/12/2018  1145   SDES PERITONEAL 03/12/2018 1145   SPECREQUEST NONE 03/12/2018 1145   SPECREQUEST NONE 03/12/2018 1145   CULT  03/12/2018 1145    NO GROWTH 5 DAYS Performed at [facility], [place] [address] [place], [place] [address]    REPTSTATUS 03/17/2018 FINAL 03/12/2018 1145   REPTSTATUS 03/12/2018 FINAL 03/12/2018 1145    Cardiac Enzymes: No results for input(s): CKTOTAL, CKMB, CKMBINDEX, TROPONINI in the last 168 hours. CBG: No results for input(s): GLUCAP in the last 168 hours. Iron Studies: No results for input(s): IRON, TIBC, TRANSFERRIN, FERRITIN in the last 72 hours. @lablastinr3 @ Studies/Results: No results found. Medications: . heparin 1,150 Units/hr (03/19/18 0241)   . sodium chloride   Intravenous Once  . ezetimibe  10 mg Oral Daily  . feeding supplement  1 Container Oral BID BM  . feeding supplement (PRO-STAT SUGAR FREE 64)  30 mL Oral TID BM  . multivitamin with minerals  1 tablet Oral Daily  . pantoprazole  40 mg Oral BID  . rosuvastatin  40 mg Oral Daily  . Warfarin - Pharmacist Dosing Inpatient   Does not apply q1800   Assessment/Plan: **hyponatremia:  Mixed picture:  Hypervolemic globally but with low urine Na, mod urine Osm and hypotension + rising  creatinine that developed with diuresis on 1/1.  It appears that intravascular volume depletion is playing a large role.  His urine sodium is low and urine osms in mid range and the hyponatremia occurred right after LVP.  He was drinking more free water after the LVP but I think there were fluid shifts that led to intravascular volume depletion.   His severe hypoalbuminemia is most likely contributing as is his recurrent chylous ascites may be contributing as well.   This is certainly a challenging case.  He improved from 122 to 126 with volume expansion with albumin 25g BID yesterday.     --Holding diuretics, give albumin 25g x 2 again today (but use 25%) and plan to hold tomorrow and see if he can maintain serum  sodium without IV volme expansion --Continue 2g sodium and 1240mL fluid restriction --Protein supplement diet as able --In future would give IV albumin 12.5g per L ascites removed with LVP  **AKI on CKD: baseline 2, creatinine peaked 3.6 12/27 and was improving gradually with small rise after diuresis 1/1.  It's improved with 2.4 today after volume expansion with albumin yesterday.  **Chylous ascites: recurrent, requiring LVPs.  Unclear etiology.  He notes LE edema improves with LVPs making me question if there is impaired venous return due to vascular compression from the ascites.  This will make managing his LE edema challenging.   **Acute blood loss anemia:  Hb 6.9 to 9s but now 7.7 this AM.  He has been treated with transfusion and clips during EGD earlier in the hospitalization.  -No indications for ESA -Transfusion per primary   **A fib/mech MV: on heparin gtt as bridge to coumadin.    **Possible gastric neuroendocrine tumor: GI has recommended 2nd opinion  Jannifer Hick MD 03/19/2018, 7:25 AM  [facility] Pager: [phone]

## 2018-03-19 NOTE — Progress Notes (Signed)
Family Medicine Teaching Service Daily Progress Note Intern Pager: [phone]  Patient name: Mario Powell Medical record number: [id number] Date of birth: 12/13/1945 Age: 73 y.o. Gender: male  Primary Care Provider: Wilber Oliphant, MD Consultants: Gastroenterology, Nephrology Code Status: Full code  Pt Overview and Major Events to Date:  Hospital Day 9 12/27: transfused 2 pRBC 12/28: large volume paracentesis (9 liters) 12/29: transfused 2 pRBC 12/31: EGD 01/04: severe hyponatremia improved  Assessment and Plan: Mario Powell is a 73 y.o. male  being treated for pneumonia, chylous ascites and upper GI bleed. PMH is significant for gastric neuroendocrine polyps, history of mitral valve replacement with mechanical valve on chronic anticoagulation, HTN, prostate cancer s/p treatment, paroxysmal atrial fibrillation, anasarca of unknown source, CKD, normocytic anemia, hyponatremia, and chronic left pleural effusion.   Severe chronic hyponatremia: Improving.  Sodium up 126 from 122.  Hypervolemic with likely intravascular depletion.  Nephrology managing.  Asymptomatic.  - Nephrology consulted, appreciate recommendations  - Continue albumin 25 g x 2 and free water along with 2 g sodium and 1200 mL fluid restriction plans while holding diuretics - Telemetry  Acute on chronic anemia: Hgb down 7.7 from 8.9. Transfusion threshold 7.0. No bleeding noted on EGD 12/31 and no overt signs of bleeding on exam. S/p transfusion x2 pRBC on 12/27+12/29.  Goal INR 2.5-3.5 for mechanical heart valve.  INR1.63. - Monitor CBC - Bridging to Coumadin on heparin with INR goal 2.5 - Protonix 40 mg twice daily  Anasarca with ascites: Improved s/p -9L fom paracentesis on 12/28. SAAG: 0.7 (unlikely to be from portal vein hypertension), LD 120, triglycerides 1,113, milky, WBCs 342. SOB resolved. S/p Levaquin (12/26-1/1), started in the ED, for pneumonia coverage. - GI signed off, patient recommendation - Holding home  Spironolactone 100 mg daily and torsemide 20 mg daily per nephrology  - Continued work-up with GI and PCP for exact etiology, may require lymphoscintigraphy or similar MR imaging for evaluation of obstruction (referral placed to [facility]) - Incentive spirometry  AKI on CKD:  Improving.  Creatinine 2.43 from 2.67.  U OP 1.450 L (net -2.874 L).  Nephrology following. - Nephrology consulted, appreciate recommendations - Holding home Spironolactone 100 mg daily and torsemide 20 mg daily - Avoid nephrotoxic medications as possible - Monitor fluid status closely, strict I and O  Gastric neuroendocrine tumor: Acute, recurrent. Previously diagnosed and biopsied with small margins via EGD in September.  However recently had an EGD on 12/20 with surgical pathology showing additional well differentiated neuroendocrine tumor extending to the edge of biopsy.  Follows with [facility] and Dr. Annamaria Boots with hematology/oncology. - To follow-up with Dr. [name] outpt, referral placed to [facility]  Bioprosthetic mitral valve: Chronic, stable. Chronically on anticoagulation with warfarin.    EGD completed today, will start heparin bridge to Coumadin with INR goal 2.5-3.5 per cardiology recommendation.   - Bridging to Coumadin on heparin with INR goal 2.5  Severe protein-calorie malnutrition: Chronic. Stable on supplements.  Likely also contributing to edema. - Nutrition consulted, appreciate recommendations - Boost breeze nutrition supplement 3 times daily, Protostat 3 times daily, multivitamin  Paroxysmal atrial fibrillation: Chronic, stable. Currently in sinus rhythm, HR 50-70.  On chronic warfarin therapy as above, not on any rate controlling regimen. - Telemetry  History of prostate cancer: Stable.  Patient reports this was 2 decades ago, s/p treatment.  No current concerns. - Monitor outpatient  Chronic hypertension: Stable. On spiro and torsemide at home. BP has been more  hypotensive in the last three  days, did not have hypotension overnight, BP this AM 95/59.  Received albumin 25g x2 yesterday. - Holding home diuretic therapy in the setting of AKI - Continue Crestor 40 mg daily  Vitamin B12 deficiency: Chronic, stable.  Follows with Dr. Annamaria Boots outpatient for injections monthly, last injection on 11/20. - Consider vitamin B12 injection vs deferment to outpatient  FEN/GI:  Regular diet with 2 g sodium and 1200 mL fluid restriction, protein supplement Prophylaxis:  Heparin bridge to warfarin   Disposition: Pending stable improvement of hyponatremia along with bridge to heparin with plan to discharge to home with home health  Subjective:  Patient says he is feeling well this morning.  Denies shortness of breath, chest pain, abdominal pain, nausea or vomiting.  Says he is ready to go home.  Objective: Temp:  [97.8 F (36.6 C)-98.3 F (36.8 C)] 98.3 F (36.8 C) (01/04 0459) Pulse Rate:  [76-80] 78 (01/04 0459) Resp:  [16-20] 20 (01/04 0459) BP: (87-102)/(55-59) 91/55 (01/04 0459) SpO2:  [97 %-99 %] 98 % (01/04 0459) Weight:  [76.2 kg] 76.2 kg (01/04 0459) Intake/Output 01/03 0701 - 01/04 0700 In: 2670.9 [P.O.:1880; I.V.:196.9; IV Piggyback:594] Out: 1450 [Urine:1450]  Physical Exam: General: frail elderly male comfortably lying in bed, A&Ox3, NAD with non-toxic appearance Cardiovascular: regular rate and rhythm without murmurs, rubs, or gallops Lungs: clear to auscultation bilaterally with normal work of breathing on room air Abdomen: soft, non-tender, non-distended, normoactive bowel sounds Skin: warm, dry, no rashes or lesions, cap refill < 2 seconds Extremities: warm and well perfused, normal tone, 1+ pitting edema BLE up to knees   Laboratory: Recent Labs  Lab 03/17/18 0358 03/18/18 0512 03/19/18 0443  WBC 5.8 5.3 4.0  HGB 9.2* 8.9* 7.7*  HCT 27.6* 26.6* 23.8*  PLT 229 211 197   Recent Labs  Lab 03/17/18 0358 03/18/18 0512 03/19/18 0443  NA 122* 122* 126*   K 4.5 4.4 4.6  CL 91* 95* 97*  CO2 24 20* 22  BUN 78* 72* 70*  CREATININE 2.72* 2.67* 2.43*  CALCIUM 7.6* 7.9* 7.8*  GLUCOSE 126* 116* 122*    Imaging/Diagnostic Tests: EGD (03/15/2018) Nonbleeding gastric ulcers, no acute bleed present  ULTRASOUND GUIDED RIGHT LOWER QUADRANT PARACENTESIS (03/12/2018) IMPRESSION: Successful ultrasound-guided paracentesis yielding 9 liters of peritoneal fluid.  ECHOCARDIOGRAM COMPLETE (03/11/2018) LVEF 60-65%, normal wall motion, G1DD, bioprosthetic MV mildly thickened  CHEST - LEFT DECUBITUS (03/10/2018) IMPRESSION: Large flowing left pleural effusion.  DG ABDOMEN ACUTE W/ 1V CHEST (03/10/2018) IMPRESSION: Ascites. LEFT pleural effusion and bibasilar atelectasis. Cannot exclude coexistent consolidation in LEFT lower lobe.     Bing, DO 03/19/2018, 9:33 AM PGY-3, [place] Intern pager: [phone], text pages welcome

## 2018-03-20 DIAGNOSIS — Z430 Encounter for attention to tracheostomy: Secondary | ICD-10-CM

## 2018-03-20 DIAGNOSIS — J9610 Chronic respiratory failure, unspecified whether with hypoxia or hypercapnia: Secondary | ICD-10-CM

## 2018-03-20 LAB — BASIC METABOLIC PANEL
Anion gap: 6 (ref 5–15)
BUN: 68 mg/dL — ABNORMAL HIGH (ref 8–23)
CO2: 22 mmol/L (ref 22–32)
Calcium: 8 mg/dL — ABNORMAL LOW (ref 8.9–10.3)
Chloride: 98 mmol/L (ref 98–111)
Creatinine, Ser: 2.6 mg/dL — ABNORMAL HIGH (ref 0.61–1.24)
GFR calc Af Amer: 27 mL/min — ABNORMAL LOW (ref >60)
GFR calc non Af Amer: 23 mL/min — ABNORMAL LOW (ref >60)
GLUCOSE: 123 mg/dL — AB (ref 70–99)
Potassium: 4.8 mmol/L (ref 3.5–5.1)
Sodium: 126 mmol/L — ABNORMAL LOW (ref 135–145)

## 2018-03-20 LAB — PROTIME-INR
INR: 1.85
Prothrombin Time: 21.1 seconds — ABNORMAL HIGH (ref 11.4–15.2)

## 2018-03-20 LAB — CBC
HCT: 24.5 % — ABNORMAL LOW (ref 39.0–52.0)
Hemoglobin: 8.2 g/dL — ABNORMAL LOW (ref 13.0–17.0)
MCH: 31.8 pg (ref 26.0–34.0)
MCHC: 33.5 g/dL (ref 30.0–36.0)
MCV: 95 fL (ref 80.0–100.0)
Platelets: 221 10*3/uL (ref 150–400)
RBC: 2.58 MIL/uL — ABNORMAL LOW (ref 4.22–5.81)
RDW: 17.2 % — ABNORMAL HIGH (ref 11.5–15.5)
WBC: 4.4 10*3/uL (ref 4.0–10.5)
nRBC: 0 % (ref 0.0–0.2)

## 2018-03-20 LAB — HEPARIN LEVEL (UNFRACTIONATED): HEPARIN UNFRACTIONATED: 0.55 [IU]/mL (ref 0.30–0.70)

## 2018-03-20 MED ORDER — WARFARIN SODIUM 3 MG PO TABS
3.0000 mg | ORAL_TABLET | Freq: Once | ORAL | Status: AC
  Administered 2018-03-20: 3 mg via ORAL
  Filled 2018-03-20: qty 1

## 2018-03-20 NOTE — Progress Notes (Signed)
[place] for heparin and warfarin Indication: heparin bridge while INR < 2.5  Allergies  Allergen Reactions  . Penicillins Other (See Comments)    DID THE REACTION INVOLVE: Swelling of the face/tongue/throat, SOB, or low BP? Unknown Sudden or severe rash/hives, skin peeling, or the inside of the mouth or nose? Unknown Did it require medical treatment? Yes When did it last happen?When pt was 73 years old Pt knows that his parents had to take him to the ER at the hospital and has not been retested with any PCN or cephalosporin that he is aware of.    Patient Measurements: Height: 5\' 11"  (180.3 cm) Weight: 174 lb 9.6 oz (79.2 kg) IBW/kg (Calculated) : 75.3 Heparin Dosing Weight: 77.7 kg (actual body weight)  Vital Signs: Temp: 97.7 F (36.5 C) (01/05 0653) Temp Source: Oral (01/05 0653) BP: 97/56 (01/05 0653) Pulse Rate: 76 (01/05 0653)  Labs: Recent Labs    03/18/18 0512  03/19/18 0443 03/19/18 1423 03/19/18 2105 03/20/18 0419 03/20/18 0853  HGB 8.9*  --  7.7* 8.3*  --  8.2*  --   HCT 26.6*  --  23.8* 25.8*  --  24.5*  --   PLT 211  --  197 199  --  221  --   LABPROT 17.7*  --  19.2*  --   --  21.1*  --   INR 1.47  --  1.63  --   --  1.85  --   HEPARINUNFRC 0.24*   < > 0.25* 0.27* 0.28*  --  0.55  CREATININE 2.67*  --  2.43*  --   --  2.60*  --    < > = values in this interval not displayed.    Estimated Creatinine Clearance: 26.5 mL/min (A) (by C-G formula based on SCr of 2.6 mg/dL (H)).   Medical History: Past Medical History:  Diagnosis Date  . Anasarca 12/01/2017  . Atrial fibrillation ([place]) 07/12/2013   Perioperative, 2001   . CAD (coronary artery disease) of artery bypass graft 07/12/2013   Saphenous vein graft 2001   . Chronic anticoagulation 07/12/2013  . Essential hypertension 07/12/2013  . History of mitral valve replacement with mechanical valve 01/11/2013   Mechanical mitral valve 2001  Bacterial endocarditis as  the cause   . HOH (hard of hearing)   . Prostate cancer ([place]) 07/12/2013    Medications:  Scheduled:  . sodium chloride   Intravenous Once  . ezetimibe  10 mg Oral Daily  . feeding supplement  1 Container Oral BID BM  . feeding supplement (PRO-STAT SUGAR FREE 64)  30 mL Oral TID BM  . multivitamin with minerals  1 tablet Oral Daily  . pantoprazole  40 mg Oral BID  . rosuvastatin  40 mg Oral Daily  . Warfarin - Pharmacist Dosing Inpatient   Does not apply q1800    Assessment: 79 yoM admitted 12/26 with complains of shortness of breath and dark stools. PTA patient is on warfarin for mechanical MVR and atrial fibrillation. Warfarin held from 12/27-12/30 (last dose 12/27 at 0135) since patient had a GI bleed requiring transfusion. Warfarin resumed 12/31. Patient's INR subtherapeutic on 03/15/18 ,thus pharmacy was consulted to initiate heparin bridge until therapeutic INR. PTA warfarin dose: 1mg  daily except for 0.5mg  on M/W/F.  INR therapeutic on admit on home dose.   Heparin level this morning within goal range at 0.55units/hr on 1600 units/hr.   INR below goal at 1.85 this morning but  is increasing appropriately.   Goal of Therapy:  Heparin level 0.3-0.7 units/ml INR 2.5 - 3.5 Monitor platelets by anticoagulation protocol: Yes   Plan:  Continue heparin infusion at 1600 units/hr   Monitor daily heparin level, CBC Warfarin 3 mg po x 1 tonight  Monitor daily INR, monitor for s/sx of bleeding.  Thank you for allowing pharmacy to be a part of this patient's care.  Harrietta Guardian, PharmD PGY1 Pharmacy Resident 03/20/2018    9:35 AM Please check AMION for all [place] numbers

## 2018-03-20 NOTE — Progress Notes (Signed)
[facility]    NEPHROLOGY PROGRESS NOTE  SUBJECTIVE: Denies any acute complaints.  No headache, chest pain, shortness of breath, nausea, vomiting, diarrhea or dysuria.  Reports his legs are no longer swollen.  Denies any increased abdominal distention or discomfort.  All other review of systems are negative.     OBJECTIVE:  Vitals:   03/20/18 0653 03/20/18 1228  BP: (!) 97/56 104/64  Pulse: 76 79  Resp: 18 18  Temp: 97.7 F (36.5 C) 98.2 F (36.8 C)  SpO2: 96% 100%   I/O last 3 completed shifts: In: 3632.2 [P.O.:2480; I.V.:409.6; IV Piggyback:742.6] Out: 2550 [Urine:2550]   [place]:  AAOx3 NAD chronically ill-appearing HEENT: MMM [place] AT anicteric sclera Neck:  No JVD, no adenopathy CV:  Heart RRR  Lungs:  L/S CTA bilaterally with decreased breath sounds at the bases Abd: Abdomen soft, mildly distended, nontender to palpation  GU:  Bladder non-palpable Extremities: 2+ bilateral lower extremity edema. Skin:  No skin rash Neuro: Moves all extremities equally and without focal deficit  MEDICATIONS:   Current Facility-Administered Medications:  .  0.9 %  sodium chloride infusion (Manually program via Guardrails IV Fluids), , Intravenous, Once, Ronnette Juniper, MD .  acetaminophen (TYLENOL) tablet 650 mg, 650 mg, Oral, Q6H PRN, Ronnette Juniper, MD .  ezetimibe (ZETIA) tablet 10 mg, 10 mg, Oral, Daily, Ronnette Juniper, MD, 10 mg at 03/20/18 0933 .  feeding supplement (BOOST / RESOURCE BREEZE) liquid 1 Container, 1 Container, Oral, BID BM, Lind Covert, MD, 1 Container at 03/20/18 [id number] .  feeding supplement (PRO-STAT SUGAR FREE 64) liquid 30 mL, 30 mL, Oral, TID BM, Ronnette Juniper, MD, 30 mL at 03/20/18 0933 .  heparin ADULT infusion 100 units/mL (25000 units/243mL sodium chloride 0.45%), 1,600 Units/hr, Intravenous, Continuous, Erenest Blank, RPH, Last Rate: 16 mL/hr at 03/20/18 0050, 1,600 Units/hr at 03/20/18 0050 .  multivitamin with minerals tablet 1 tablet, 1  tablet, Oral, Daily, Ronnette Juniper, MD, 1 tablet at 03/20/18 0933 .  pantoprazole (PROTONIX) EC tablet 40 mg, 40 mg, Oral, BID, Rumball, Alison, DO, 40 mg at 03/20/18 0933 .  polyethylene glycol (MIRALAX / GLYCOLAX) packet 17 g, 17 g, Oral, Daily PRN, Ronnette Juniper, MD, 17 g at 03/20/18 0933 .  polyvinyl alcohol (LIQUIFILM TEARS) 1.4 % ophthalmic solution 2-3 drop, 2-3 drop, Both Eyes, Daily PRN, Ronnette Juniper, MD .  rosuvastatin (CRESTOR) tablet 40 mg, 40 mg, Oral, Daily, Ronnette Juniper, MD, 40 mg at 03/20/18 0934 .  warfarin (COUMADIN) tablet 3 mg, 3 mg, Oral, ONCE-1800, Whiteside, Colton Z, RPH .  Warfarin - Pharmacist Dosing Inpatient, , Does not apply, q1800, Corinda Gubler, [facility]     LABS:  CBC Latest Ref Rng & Units 03/20/2018 03/19/2018 03/19/2018  WBC 4.0 - 10.5 K/uL 4.4 3.9(L) 4.0  Hemoglobin 13.0 - 17.0 g/dL 8.2(L) 8.3(L) 7.7(L)  Hematocrit 39.0 - 52.0 % 24.5(L) 25.8(L) 23.8(L)  Platelets 150 - 400 K/uL 221 199 197    CMP Latest Ref Rng & Units 03/20/2018 03/19/2018 03/18/2018  Glucose 70 - 99 mg/dL 123(H) 122(H) 116(H)  BUN 8 - 23 mg/dL 68(H) 70(H) 72(H)  Creatinine 0.61 - 1.24 mg/dL 2.60(H) 2.43(H) 2.67(H)  Sodium 135 - 145 mmol/L 126(L) 126(L) 122(L)  Potassium 3.5 - 5.1 mmol/L 4.8 4.6 4.4  Chloride 98 - 111 mmol/L 98 97(L) 95(L)  CO2 22 - 32 mmol/L 22 22 20(L)  Calcium 8.9 - 10.3 mg/dL 8.0(L) 7.8(L) 7.9(L)  Total Protein 6.5 - 8.1 g/dL - - -  Total Bilirubin 0.3 - 1.2 mg/dL - - -  Alkaline Phos 38 - 126 U/L - - -  AST 15 - 41 U/L - - -  ALT 0 - 44 U/L - - -    Lab Results  Component Value Date   CALCIUM 8.0 (L) 03/20/2018       Component Value Date/Time   COLORURINE YELLOW 03/11/2018 0148   APPEARANCEUR CLEAR 03/11/2018 0148   LABSPEC 1.016 03/11/2018 0148   PHURINE 5.0 03/11/2018 0148   GLUCOSEU NEGATIVE 03/11/2018 0148   HGBUR NEGATIVE 03/11/2018 0148   BILIRUBINUR NEGATIVE 03/11/2018 0148   KETONESUR NEGATIVE 03/11/2018 0148   PROTEINUR NEGATIVE 03/11/2018 0148    NITRITE NEGATIVE 03/11/2018 0148   LEUKOCYTESUR NEGATIVE 03/11/2018 0148   No results found for: PHART, PCO2ART, PO2ART, HCO3, TCO2, ACIDBASEDEF, O2SAT     Component Value Date/Time   IRON 37 (L) 12/23/2017 1540   TIBC 354 12/23/2017 1540   FERRITIN 90 12/23/2017 1540   IRONPCTSAT 10 (L) 12/23/2017 1540       ASSESSMENT/PLAN:     Problem List Items Addressed This Visit      Respiratory   Left lower lobe pneumonia (HCC)   Relevant Medications   levofloxacin (LEVAQUIN) IVPB 750 mg (Completed)   levofloxacin (LEVAQUIN) tablet 750 mg (Completed)   Other Relevant Orders   DG Chest Left Decubitus (Completed)     Digestive   Gastrointestinal hemorrhage   Benign neuroendocrine neoplasm of cecum   Relevant Orders   Ambulatory referral to Gastroenterology     Other   Anasarca   Relevant Orders   Ambulatory referral to Gastroenterology   Ascites   Relevant Orders   US Paracentesis (Completed)   SOB (shortness of breath) - Primary   Chylous ascites   Relevant Orders   Ambulatory referral to Gastroenterology   Ambulatory referral to Interventional Radiology      1.  Hypervolemic hyponatremia.  Given his edema and ascites, he falls into the hypervolemic classification.  However, he clearly has market intravascular volume depletion as a continued stimulus for hyponatremia.  Given his severe hypoalbuminemia and recurrent chylous ascites, I doubt he will really have a normal serum sodium unless this has resolved.  His serum sodium appears to be stable in the mid 120s.  He is volume expanded with albumin 25 g twice yesterday.  We will hold on volume expansion today and monitor his serum sodium.  Continues on a 2 g sodium and 1200 mL fluid restriction.  Needs to have IV albumin 12.5 g/L of ascites removed with large volume paracentesis.  2.  Chronic kidney disease stage IV.  His baseline serum creatinine runs around 2.  3.  Acute kidney injury.  His creatinine peaked around 3.6 on  12/27 and has stabilized.  He improves after volume expansion.  4.  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  He has improvement in his lower extremity edema with large-volume paracenteses suggesting some impaired venous return from vascular compression from the ascites.  Has plan for ultimate work-up of chylous ascites at [facility].  5.  Acute blood loss anemia.  Hemoglobin appears stable today.  No indication for ESA.  6.  A. fib/mechanical valve.  On Coumadin      [employer], DO, FACP

## 2018-03-20 NOTE — Progress Notes (Signed)
[place] for Heparin/Warfarin  Indication: heparin bridge while INR < 2.5  Allergies  Allergen Reactions  . Penicillins Other (See Comments)    DID THE REACTION INVOLVE: Swelling of the face/tongue/throat, SOB, or low BP? Unknown Sudden or severe rash/hives, skin peeling, or the inside of the mouth or nose? Unknown Did it require medical treatment? Yes When did it last happen?When pt was 73 years old Pt knows that his parents had to take him to the ER at the hospital and has not been retested with any PCN or cephalosporin that he is aware of.    Patient Measurements: Height: 5\' 11"  (180.3 cm) Weight: 168 lb 1.6 oz (76.2 kg) IBW/kg (Calculated) : 75.3 Heparin Dosing Weight: 77.7 kg (actual body weight)  Vital Signs: Temp: 98.5 F (36.9 C) (01/04 2049) Temp Source: Oral (01/04 2049) BP: 100/48 (01/04 2049) Pulse Rate: 78 (01/04 2049)  Labs: Recent Labs    03/17/18 0358 03/18/18 0512  03/19/18 0443 03/19/18 1423 03/19/18 2105  HGB 9.2* 8.9*  --  7.7* 8.3*  --   HCT 27.6* 26.6*  --  23.8* 25.8*  --   PLT 229 211  --  197 199  --   LABPROT 17.6* 17.7*  --  19.2*  --   --   INR 1.46 1.47  --  1.63  --   --   HEPARINUNFRC 0.52 0.24*   < > 0.25* 0.27* 0.28*  CREATININE 2.72* 2.67*  --  2.43*  --   --    < > = values in this interval not displayed.    Estimated Creatinine Clearance: 28.4 mL/min (A) (by C-G formula based on SCr of 2.43 mg/dL (H)).   Medical History: Past Medical History:  Diagnosis Date  . Anasarca 12/01/2017  . Atrial fibrillation ([place]) 07/12/2013   Perioperative, 2001   . CAD (coronary artery disease) of artery bypass graft 07/12/2013   Saphenous vein graft 2001   . Chronic anticoagulation 07/12/2013  . Essential hypertension 07/12/2013  . History of mitral valve replacement with mechanical valve 01/11/2013   Mechanical mitral valve 2001  Bacterial endocarditis as the cause   . HOH (hard of hearing)   . Prostate  cancer ([place]) 07/12/2013    Medications:  Scheduled:  . sodium chloride   Intravenous Once  . ezetimibe  10 mg Oral Daily  . feeding supplement  1 Container Oral BID BM  . feeding supplement (PRO-STAT SUGAR FREE 64)  30 mL Oral TID BM  . multivitamin with minerals  1 tablet Oral Daily  . pantoprazole  40 mg Oral BID  . rosuvastatin  40 mg Oral Daily  . Warfarin - Pharmacist Dosing Inpatient   Does not apply q1800    Assessment: 73 yoM admitted 12/26 with complains of shortness of breath and dark stools. PTA patient is on warfarin for mechanical MVR and atrial fibrillation. Warfarin held from 12/27-12/30 (last dose 12/27 at 0135) since patient had a GI bleed requiring transfusion. Warfarin resumed 12/31. Patient's INR subtherapeutic on 03/15/18 ,thus pharmacy was consulted to initiate heparin bridge until therapeutic INR.   1/5 AM update: heparin level just below goal despite rate increase, no issues per RN.   Goal of Therapy:  Heparin level 0.3-0.7 units/ml INR 2.5 - 3.5 Monitor platelets by anticoagulation protocol: Yes   Plan:  Inc heparin to 1600 units/hr Re-check heparin level at 0900 HL  Narda Bonds, PharmD, [place] Pharmacist Phone: [phone]

## 2018-03-20 NOTE — Progress Notes (Signed)
Family Medicine Teaching Service Daily Progress Note Intern Pager: [phone]  Patient name: Mario Powell Medical record number: [id number] Date of birth: [birth year] Age: 73 y.o. Gender: male  Primary Care Provider: Wilber Oliphant, MD Consultants: Gastroenterology, Nephrology Code Status: Full code  Pt Overview and Major Events to Date:  Hospital Day 10 12/27: transfused 2 pRBC 12/28: large volume paracentesis (9 liters) 12/29: transfused 2 pRBC 12/31: EGD 01/04: severe hyponatremia improved  Assessment and Plan: Mario Powell is a 73 y.o. male  being treated for pneumonia, chylous ascites and upper Powell bleed. PMH is significant for gastric neuroendocrine polyps, history of mitral valve replacement with mechanical valve on chronic anticoagulation, HTN, prostate cancer s/p treatment, paroxysmal atrial fibrillation, anasarca of unknown source, CKD, normocytic anemia, hyponatremia, and chronic left pleural effusion.   Severe chronic hyponatremia: Improving   Sodium remained stable at 126 today, much improved from previous 122.  Nephrology managing.  Remains with intravascular volume depletion, improved since albumin administration x3 days.  Per nephrology will not repeat albumin today, and will see if he can maintain serum sodium without IV volume expansion. - Nephrology consulted, appreciate recommendations  - Continue 2 g sodium and 1200 mL fluid restriction plans while holding diuretics -Per nephrology, after future paracentesis would give albumin IV 12.5 g/L of ascites removed - Telemetry  Acute on chronic anemia: Stable  No bleeding noted on EGD 12/31 and no overt signs of bleeding on exam. S/p transfusion x2 pRBC on 12/27+12/29.  Goal INR 2.5-3.5 for mechanical heart valve.  Hemoglobin this a.m. 8.2, no signs of bleeding.  INR this a.m. 1.85. - Monitor CBC - Bridging to Coumadin on heparin with INR goal 2.5 - Protonix 40 mg twice daily  Anasarca with ascites: Improved  s/p -9L fom  paracentesis on 12/28. SAAG: 0.7 (unlikely to be from portal vein hypertension), LD 120, triglycerides 1,113, milky, WBCs 342. SOB resolved. S/p Levaquin (12/26-1/1), started in the ED, for pneumonia coverage. - Powell signed off, patient recommendation - Holding home Spironolactone 100 mg daily and torsemide 20 mg daily per nephrology  - Continued work-up with Powell and PCP for exact etiology, may require lymphoscintigraphy or similar MR imaging for evaluation of obstruction (referral placed to [facility]) - Incentive spirometry -With future paracentesis nephrology recommends IV albumin 12.5 g/L of ascites removed  AKI on CKD:  Improved Creatinine 2.43> 2.60.  Nephrology following.  Overall output -2.6 L since admission. - Nephrology consulted, appreciate recommendations - Holding home Spironolactone 100 mg daily and torsemide 20 mg daily - Avoid nephrotoxic medications as possible - Monitor fluid status closely, strict I and O  Gastric neuroendocrine tumor: Acute, recurrent.  Previously diagnosed and biopsied with small margins via EGD in September.  However recently had an EGD on 12/20 with surgical pathology showing additional well differentiated neuroendocrine tumor extending to the edge of biopsy.  Follows with Mario Powell and Mario Powell with hematology/oncology. - To follow-up with Mario Powell outpt, referral placed to [facility]  Bioprosthetic mitral valve: Chronic, stable.  Chronically on anticoagulation with warfarin.    Following EGD and resolution of Powell bleed patient is bridging heparin to warfarin with goal of 2.5-3.5 INR per cardiology. - Bridging to Coumadin on heparin with INR goal 2.5  Severe protein-calorie malnutrition: Chronic. Stable on supplements.  Likely also contributing to edema. - Nutrition consulted, appreciate recommendations - Boost breeze nutrition supplement 3 times daily, Protostat 3 times daily, multivitamin  Paroxysmal atrial fibrillation: Chronic,  stable. Currently in sinus rhythm, HR 70s.  On chronic warfarin therapy as above, not on any rate controlling regimen. - Telemetry - d/c cardiac monitoring  History of prostate cancer: Stable.  Patient reports this was 2 decades ago, s/p treatment.  No current concerns. - Monitor outpatient  Chronic hypertension: Stable.  On spiro and torsemide at home. Patient has been more hypotensive during hospitalization, likely 2/2 intravascular volume depletion.  BP this AM 100/48, MAP 64. - Holding home diuretic therapy in the setting of AKI - Continue Mario Powell 40 mg daily  Vitamin B12 deficiency: Chronic, stable.  Follows with Mario Powell outpatient for injections monthly, last injection on 11/20. - Consider vitamin B12 injection vs deferment to outpatient  FEN/Powell:  Regular diet with 2 g sodium and 1200 mL fluid restriction, protein supplement Prophylaxis:  Heparin bridge to warfarin   Disposition: Home with HH pending continued stabilization of Na without albumin and therapeutic INR following bridge  Subjective:  Patient very frustrated this AM and states that he would like to leave.  He eventually voiced understanding that INR is not therapeutic and he needs to stay until that time. Would like his cardiac monitoring d/c'ed. Otherwise has no complaints.  Objective: Temp:  [97.7 F (36.5 C)-98.5 F (36.9 C)] 97.7 F (36.5 C) (01/05 0653) Pulse Rate:  [74-78] 76 (01/05 0653) Resp:  [16-18] 18 (01/05 0653) BP: (93-100)/(48-56) 97/56 (01/05 0653) SpO2:  [96 %-100 %] 96 % (01/05 0653) Weight:  [79.2 kg] 79.2 kg (01/05 0036) Intake/Output 01/04 0701 - 01/05 0700 In: 2133 [P.O.:1680; I.V.:304.3; IV Piggyback:148.6] Out: 1950 [Urine:1950]  Physical Exam: General: 73 y.o. male in NAD, sitting up in bed eating breakfast Cardio: RRR, mechanical click Lungs: decreased breath sounds LLL, stable, no increased WOB, otherwise lungs clear Abdomen: Soft, non-tender to palpation, positive bowel  sounds Skin: warm and dry Extremities: 2+ edema BLE to thighs    Laboratory: Recent Labs  Lab 03/19/18 0443 03/19/18 1423 03/20/18 0419  WBC 4.0 3.9* 4.4  HGB 7.7* 8.3* 8.2*  HCT 23.8* 25.8* 24.5*  PLT 197 199 221   Recent Labs  Lab 03/18/18 0512 03/19/18 0443 03/20/18 0419  NA 122* 126* 126*  K 4.4 4.6 4.8  CL 95* 97* 98  CO2 20* 22 22  BUN 72* 70* 68*  CREATININE 2.67* 2.43* 2.60*  CALCIUM 7.9* 7.8* 8.0*  GLUCOSE 116* 122* 123*    Imaging/Diagnostic Tests: EGD (03/15/2018) Nonbleeding gastric ulcers, no acute bleed present  ULTRASOUND GUIDED RIGHT LOWER QUADRANT PARACENTESIS (03/12/2018) IMPRESSION: Successful ultrasound-guided paracentesis yielding 9 liters of peritoneal fluid.  ECHOCARDIOGRAM COMPLETE (03/11/2018) LVEF 60-65%, normal wall motion, G1DD, bioprosthetic MV mildly thickened  CHEST - LEFT DECUBITUS (03/10/2018) IMPRESSION: Large flowing left pleural effusion.  DG ABDOMEN ACUTE W/ 1V CHEST (03/10/2018) IMPRESSION: Ascites. LEFT pleural effusion and bibasilar atelectasis. Cannot exclude coexistent consolidation in LEFT lower lobe.    Meccariello, Bernita Raisin, DO 03/20/2018, 8:14 AM PGY-1, [place] Intern pager: [phone], text pages welcome

## 2018-03-20 NOTE — Progress Notes (Signed)
[place] for Heparin/Warfarin  Indication: heparin bridge while INR < 2.5  Allergies  Allergen Reactions  . Penicillins Other (See Comments)    DID THE REACTION INVOLVE: Swelling of the face/tongue/throat, SOB, or low BP? Unknown Sudden or severe rash/hives, skin peeling, or the inside of the mouth or nose? Unknown Did it require medical treatment? Yes When did it last happen?When pt was 73 years old Pt knows that his parents had to take him to the ER at the hospital and has not been retested with any PCN or cephalosporin that he is aware of.    Patient Measurements: Height: 5\' 11"  (180.3 cm) Weight: 168 lb 1.6 oz (76.2 kg) IBW/kg (Calculated) : 75.3 Heparin Dosing Weight: 77.7 kg (actual body weight)  Vital Signs: Temp: 98.5 F (36.9 C) (01/04 2049) Temp Source: Oral (01/04 2049) BP: 100/48 (01/04 2049) Pulse Rate: 78 (01/04 2049)  Labs: Recent Labs    03/17/18 0358 03/18/18 0512  03/19/18 0443 03/19/18 1423 03/19/18 2105  HGB 9.2* 8.9*  --  7.7* 8.3*  --   HCT 27.6* 26.6*  --  23.8* 25.8*  --   PLT 229 211  --  197 199  --   LABPROT 17.6* 17.7*  --  19.2*  --   --   INR 1.46 1.47  --  1.63  --   --   HEPARINUNFRC 0.52 0.24*   < > 0.25* 0.27* 0.28*  CREATININE 2.72* 2.67*  --  2.43*  --   --    < > = values in this interval not displayed.    Estimated Creatinine Clearance: 28.4 mL/min (A) (by C-G formula based on SCr of 2.43 mg/dL (H)).   Medical History: Past Medical History:  Diagnosis Date  . Anasarca 12/01/2017  . Atrial fibrillation ([place]) 07/12/2013   Perioperative, 2001   . CAD (coronary artery disease) of artery bypass graft 07/12/2013   Saphenous vein graft 2001   . Chronic anticoagulation 07/12/2013  . Essential hypertension 07/12/2013  . History of mitral valve replacement with mechanical valve 01/11/2013   Mechanical mitral valve 2001  Bacterial endocarditis as the cause   . HOH (hard of hearing)   . Prostate  cancer ([place]) 07/12/2013    Medications:  Scheduled:  . sodium chloride   Intravenous Once  . ezetimibe  10 mg Oral Daily  . feeding supplement  1 Container Oral BID BM  . feeding supplement (PRO-STAT SUGAR FREE 64)  30 mL Oral TID BM  . multivitamin with minerals  1 tablet Oral Daily  . pantoprazole  40 mg Oral BID  . rosuvastatin  40 mg Oral Daily  . Warfarin - Pharmacist Dosing Inpatient   Does not apply q1800    Assessment: 44 yoM admitted 12/26 with complains of shortness of breath and dark stools. PTA patient is on warfarin for mechanical MVR and atrial fibrillation. Warfarin held from 12/27-12/30 (last dose 12/27 at 0135) since patient had a GI bleed requiring transfusion. Warfarin resumed 12/31. Patient's INR subtherapeutic on 03/15/18 ,thus pharmacy was consulted to initiate heparin bridge until therapeutic INR.   1/5 AM update: heparin level just below goal despite rate increase, no issues per RN.   Goal of Therapy:  Heparin level 0.3-0.5 units/mL (GIB) INR 2.5-3.5 Monitor platelets by anticoagulation protocol: Yes   Plan:  Inc heparin to 1600 units/hr Re-check heparin level at 0900 HL  Narda Bonds, PharmD, [place] Pharmacist Phone: [phone]

## 2018-03-21 DIAGNOSIS — J9610 Chronic respiratory failure, unspecified whether with hypoxia or hypercapnia: Secondary | ICD-10-CM | POA: Diagnosis present

## 2018-03-21 DIAGNOSIS — Z430 Encounter for attention to tracheostomy: Secondary | ICD-10-CM

## 2018-03-21 DIAGNOSIS — L8990 Pressure ulcer of unspecified site, unspecified stage: Secondary | ICD-10-CM | POA: Diagnosis present

## 2018-03-21 LAB — CBC
HCT: 26.2 % — ABNORMAL LOW (ref 39.0–52.0)
Hemoglobin: 8.5 g/dL — ABNORMAL LOW (ref 13.0–17.0)
MCH: 30.4 pg (ref 26.0–34.0)
MCHC: 32.4 g/dL (ref 30.0–36.0)
MCV: 93.6 fL (ref 80.0–100.0)
Platelets: 260 10*3/uL (ref 150–400)
RBC: 2.8 MIL/uL — ABNORMAL LOW (ref 4.22–5.81)
RDW: 17.1 % — ABNORMAL HIGH (ref 11.5–15.5)
WBC: 4.5 10*3/uL (ref 4.0–10.5)
nRBC: 0 % (ref 0.0–0.2)

## 2018-03-21 LAB — BASIC METABOLIC PANEL
Anion gap: 7 (ref 5–15)
BUN: 69 mg/dL — ABNORMAL HIGH (ref 8–23)
CALCIUM: 7.8 mg/dL — AB (ref 8.9–10.3)
CO2: 21 mmol/L — ABNORMAL LOW (ref 22–32)
CREATININE: 2.43 mg/dL — AB (ref 0.61–1.24)
Chloride: 96 mmol/L — ABNORMAL LOW (ref 98–111)
GFR calc Af Amer: 29 mL/min — ABNORMAL LOW (ref >60)
GFR calc non Af Amer: 25 mL/min — ABNORMAL LOW (ref >60)
Glucose, Bld: 117 mg/dL — ABNORMAL HIGH (ref 70–99)
Potassium: 4.9 mmol/L (ref 3.5–5.1)
Sodium: 124 mmol/L — ABNORMAL LOW (ref 135–145)

## 2018-03-21 LAB — HEPARIN LEVEL (UNFRACTIONATED): Heparin Unfractionated: 0.76 IU/mL — ABNORMAL HIGH (ref 0.30–0.70)

## 2018-03-21 LAB — PROTIME-INR
INR: 2.2
Prothrombin Time: 24.1 seconds — ABNORMAL HIGH (ref 11.4–15.2)

## 2018-03-21 MED ORDER — WARFARIN SODIUM 1 MG PO TABS
1.0000 mg | ORAL_TABLET | Freq: Once | ORAL | Status: AC
  Administered 2018-03-21: 1 mg via ORAL
  Filled 2018-03-21: qty 1

## 2018-03-21 NOTE — Progress Notes (Addendum)
[place] for heparin and warfarin Indication: heparin bridge while INR < 2.5  Allergies  Allergen Reactions  . Penicillins Other (See Comments)    DID THE REACTION INVOLVE: Swelling of the face/tongue/throat, SOB, or low BP? Unknown Sudden or severe rash/hives, skin peeling, or the inside of the mouth or nose? Unknown Did it require medical treatment? Yes When did it last happen?When pt was 73 years old Pt knows that his parents had to take him to the ER at the hospital and has not been retested with any PCN or cephalosporin that he is aware of.    Patient Measurements: Height: 5\' 11"  (180.3 cm) Weight: 181 lb (82.1 kg) IBW/kg (Calculated) : 75.3 Heparin Dosing Weight: 77.7 kg (actual body weight)  Vital Signs: Temp: 97.7 F (36.5 C) (01/06 1249) Temp Source: Oral (01/06 1249) BP: 117/67 (01/06 1249) Pulse Rate: 77 (01/06 1249)  Labs: Recent Labs    03/19/18 0443 03/19/18 1423 03/19/18 2105 03/20/18 0419 03/20/18 0853 03/21/18 0533 03/21/18 0813  HGB 7.7* 8.3*  --  8.2*  --  8.5*  --   HCT 23.8* 25.8*  --  24.5*  --  26.2*  --   PLT 197 199  --  221  --  260  --   LABPROT 19.2*  --   --  21.1*  --  24.1*  --   INR 1.63  --   --  1.85  --  2.20  --   HEPARINUNFRC 0.25* 0.27* 0.28*  --  0.55  --  0.76*  CREATININE 2.43*  --   --  2.60*  --  2.43*  --     Estimated Creatinine Clearance: 28.4 mL/min (A) (by C-G formula based on SCr of 2.43 mg/dL (H)).  Assessment: 29 yoM admitted 12/26 with complains of shortness of breath and dark stools. PTA patient is on warfarin for mechanical MVR and atrial fibrillation. Warfarin held from 12/27-12/30 (last dose 12/27 at 0135) since patient had a GI bleed requiring transfusion. Warfarin resumed 12/31. INR subtherapeutic on 03/15/18 ,thus pharmacy was consulted to initiate heparin bridge until therapeutic INR.   INR subtherapeutic at 2.20 and increasing appropriately.  HL supratherapeutic at  0.76. H&H low stable, plt 260. No bleeding noted.  PTA warfarin dose: 1mg  daily except for 0.5mg  on M/W/F  Goal of Therapy:  Heparin level 0.3-0.7 units/ml INR 2.5 - 3.5 Monitor platelets by anticoagulation protocol: Yes   Plan:  Warfarin, 1 mg PO tonight Decrease heparin to 1500 units/hr Monitor daily INR, HL, CBC  Monitor for s/sx of bleeding  Thank you for allowing pharmacy to be a part of this patient's care.   Renold Genta, PharmD   Azzie Roup D PGY1 Pharmacy Resident  Phone [phone] Please use AMION for clinical pharmacists numbers  03/22/2018      8:01 AM

## 2018-03-21 NOTE — Progress Notes (Signed)
PT Cancellation Note  Patient Details Name: Mario Powell MRN: [id number] DOB: [birth year]   Cancelled Treatment:    Reason Eval/Treat Not Completed: Fatigue/lethargy limiting ability to participate Attempted to see pt in AM however he reported just getting up to the bathroom and too fatigued to participate. Will follow up as time allows.   Marguarite Arbour A Adekunle Rohrbach 03/21/2018, 12:36 PM Wray Kearns, PT, DPT Acute Rehabilitation Services Pager [phone] Office [phone]

## 2018-03-21 NOTE — Progress Notes (Signed)
[facility]    NEPHROLOGY PROGRESS NOTE  SUBJECTIVE: Denies any acute complaints.  No headache, chest pain, shortness of breath, nausea, vomiting, diarrhea or dysuria.  Reports his legs are no longer swollen.  Denies any increased abdominal distention or discomfort.  All other review of systems are negative.   OBJECTIVE:  Vitals:   03/21/18 0537 03/21/18 1249  BP: 99/67 117/67  Pulse: 81 77  Resp: 18 18  Temp: 98 F (36.7 C) 97.7 F (36.5 C)  SpO2: 98% 100%   I/O last 3 completed shifts: In: 1364.6 [P.O.:840; I.V.:524.6] Out: 2601 [Urine:2600; Stool:1]   [place]:  AAOx3 NAD chronically ill-appearing HEENT: MMM [place] AT anicteric sclera Neck:  No JVD, no adenopathy CV:  Heart RRR  Lungs:  L/S CTA bilaterally with decreased breath sounds at the bases Abd: Abdomen soft, mildly distended, nontender to palpation  GU:  Bladder non-palpable Extremities: 2+ bilateral lower extremity edema. Skin:  No skin rash Neuro: Moves all extremities equally and without focal deficit  MEDICATIONS:   Current Facility-Administered Medications:  .  acetaminophen (TYLENOL) tablet 650 mg, 650 mg, Oral, Q6H PRN, Ronnette Juniper, MD .  ezetimibe (ZETIA) tablet 10 mg, 10 mg, Oral, Daily, Ronnette Juniper, MD, 10 mg at 03/21/18 0948 .  feeding supplement (BOOST / RESOURCE BREEZE) liquid 1 Container, 1 Container, Oral, BID BM, Lind Covert, MD, 1 Container at 03/21/18 [phone] .  feeding supplement (PRO-STAT SUGAR FREE 64) liquid 30 mL, 30 mL, Oral, TID BM, Ronnette Juniper, MD, 30 mL at 03/21/18 1449 .  heparin ADULT infusion 100 units/mL (25000 units/250mL sodium chloride 0.45%), 1,500 Units/hr, Intravenous, Continuous, Alvira Philips, [place], Last Rate: 15 mL/hr at 03/21/18 1003, 1,500 Units/hr at 03/21/18 1003 .  multivitamin with minerals tablet 1 tablet, 1 tablet, Oral, Daily, Ronnette Juniper, MD, 1 tablet at 03/21/18 0949 .  pantoprazole (PROTONIX) EC tablet 40 mg, 40 mg, Oral, BID, [place],  Alison, DO, 40 mg at 03/21/18 0949 .  polyethylene glycol (MIRALAX / GLYCOLAX) packet 17 g, 17 g, Oral, Daily PRN, Ronnette Juniper, MD, 17 g at 03/20/18 0933 .  polyvinyl alcohol (LIQUIFILM TEARS) 1.4 % ophthalmic solution 2-3 drop, 2-3 drop, Both Eyes, Daily PRN, Ronnette Juniper, MD .  rosuvastatin (CRESTOR) tablet 40 mg, 40 mg, Oral, Daily, Ronnette Juniper, MD, 40 mg at 03/21/18 0948 .  warfarin (COUMADIN) tablet 1 mg, 1 mg, Oral, ONCE-1800, Alvira Philips, [place] .  Warfarin - Pharmacist Dosing Inpatient, , Does not apply, q1800, Corinda Gubler, [facility]     LABS:  CBC Latest Ref Rng & Units 03/21/2018 03/20/2018 03/19/2018  WBC 4.0 - 10.5 K/uL 4.5 4.4 3.9(L)  Hemoglobin 13.0 - 17.0 g/dL 8.5(L) 8.2(L) 8.3(L)  Hematocrit 39.0 - 52.0 % 26.2(L) 24.5(L) 25.8(L)  Platelets 150 - 400 K/uL 260 221 199    CMP Latest Ref Rng & Units 03/21/2018 03/20/2018 03/19/2018  Glucose 70 - 99 mg/dL 117(H) 123(H) 122(H)  BUN 8 - 23 mg/dL 69(H) 68(H) 70(H)  Creatinine 0.61 - 1.24 mg/dL 2.43(H) 2.60(H) 2.43(H)  Sodium 135 - 145 mmol/L 124(L) 126(L) 126(L)  Potassium 3.5 - 5.1 mmol/L 4.9 4.8 4.6  Chloride 98 - 111 mmol/L 96(L) 98 97(L)  CO2 22 - 32 mmol/L 21(L) 22 22  Calcium 8.9 - 10.3 mg/dL 7.8(L) 8.0(L) 7.8(L)  Total Protein 6.5 - 8.1 g/dL - - -  Total Bilirubin 0.3 - 1.2 mg/dL - - -  Alkaline Phos 38 - 126 U/L - - -  AST 15 -  41 U/L - - -  ALT 0 - 44 U/L - - -    Lab Results  Component Value Date   CALCIUM 7.8 (L) 03/21/2018       Component Value Date/Time   COLORURINE YELLOW 03/11/2018 0148   APPEARANCEUR CLEAR 03/11/2018 0148   LABSPEC 1.016 03/11/2018 0148   PHURINE 5.0 03/11/2018 0148   GLUCOSEU NEGATIVE 03/11/2018 0148   HGBUR NEGATIVE 03/11/2018 0148   BILIRUBINUR NEGATIVE 03/11/2018 0148   KETONESUR NEGATIVE 03/11/2018 0148   PROTEINUR NEGATIVE 03/11/2018 0148   NITRITE NEGATIVE 03/11/2018 0148   LEUKOCYTESUR NEGATIVE 03/11/2018 0148   No results found for: PHART, PCO2ART, PO2ART, HCO3, TCO2,  ACIDBASEDEF, O2SAT     Component Value Date/Time   IRON 37 (L) 12/23/2017 1540   TIBC 354 12/23/2017 1540   FERRITIN 90 12/23/2017 1540   IRONPCTSAT 10 (L) 12/23/2017 1540       ASSESSMENT/PLAN:     Problem List Items Addressed This Visit      Respiratory   Left lower lobe pneumonia (HCC)   Relevant Medications   levofloxacin (LEVAQUIN) IVPB 750 mg (Completed)   levofloxacin (LEVAQUIN) tablet 750 mg (Completed)   Other Relevant Orders   DG Chest Left Decubitus (Completed)     Digestive   Gastrointestinal hemorrhage   Benign neuroendocrine neoplasm of cecum   Relevant Orders   Ambulatory referral to Gastroenterology     Other   Anasarca   Relevant Orders   Ambulatory referral to Gastroenterology   Ascites   Relevant Orders   US Paracentesis (Completed)   SOB (shortness of breath) - Primary   Chylous ascites   Relevant Orders   Ambulatory referral to Gastroenterology   Ambulatory referral to Interventional Radiology      1.  Hypervolemic hyponatremia.  Given his edema and ascites, he falls into the hypervolemic classification.  However, he clearly has market intravascular volume depletion as a continued stimulus for hyponatremia.  Given his severe hypoalbuminemia and recurrent chylous ascites, I doubt he will really have a normal serum sodium unless this has resolved.  His serum sodium appears to be stable in the mid 120s.  He was volume expanded with albumin 25 g on 03/19/2018.  We will hold on volume expansion today and monitor his serum sodium.  Continues on a 2 g sodium and 1200 mL fluid restriction.  Needs to have IV albumin 12.5 g/L of ascites removed with large volume paracentesis.  2.  Chronic kidney disease stage IV.  His baseline serum creatinine runs around 2.  3.  Acute kidney injury.  His creatinine peaked around 3.6 on 12/27 and has improved.  He tends to improve after volume expansion.  4.  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  He has improvement in  his lower extremity edema with large-volume paracenteses suggesting some impaired venous return from vascular compression from the ascites.  Has plan for ultimate work-up of chylous ascites at [facility].  5.  Acute blood loss anemia.  Hemoglobin appears stable today.  No indication for ESA.  6.  A. fib/mechanical valve.  On Coumadin      [employer], DO, FACP

## 2018-03-21 NOTE — Progress Notes (Signed)
OT Cancellation Note  Patient Details Name: Mario Powell MRN: [id number] DOB: Mar 29, 1945   Cancelled Treatment:    Reason Eval/Treat Not Completed: Fatigue/lethargy limiting ability to participate. Pt declined stating that he was tired from getting up and going to restroom around 10 a.m. although its approximately 12:30 pm during attempt from OT to see pt. Pt also states that he would wait until PT returns to get OOB for activity. OT educated pt on benefits of being OOB/risks of being in bed prolonged periods of time and benefits of  participating with therapies when given every opportunity to do so, however pt continued to decline  Britt Bottom 03/21/2018, 1:04 PM

## 2018-03-21 NOTE — Care Management Important Message (Signed)
Important Message  Patient Details  Name: Mario Powell MRN: [id number] Date of Birth: 06/25/1945   Medicare Important Message Given:  Yes    Barb Merino Jlon Betker 03/21/2018, 3:48 PM

## 2018-03-21 NOTE — Progress Notes (Signed)
Physical Therapy Treatment Patient Details Name: Mario Powell MRN: [id number] DOB: [birth year] Today's Date: 03/21/2018    History of Present Illness Pt is a 73 y.o. male admitted 03/10/18 with SOB and darkened stools. Imaging showed ascites with L pleural effusion and atelectasis. S/p paracentesis 12/28. Pt with hyponatremia and AKI on CKD. S/p EGD 12/31. PMH includes a fib on Coumadin, CAD, HTN, MVR, anemia, prostate CA.    PT Comments    Patient progressing well towards PT goals. Agreeable to work with PT after 3 attempts today. Tolerated gait training with supervision for safety. 1 seated rest break needed due to fatigue. Slow to respond to questions. Continues to report swollen, painful scrotum. Pt has stairs to negotiate to get into home however declined performing today; "I would get up one and then just fall." Will follow.   Follow Up Recommendations  Home health PT;Supervision for mobility/OOB     Equipment Recommendations  None recommended by PT    Recommendations for Other Services       Precautions / Restrictions Precautions Precautions: Fall Precaution Comments: Swollen painful scrotum Restrictions Weight Bearing Restrictions: No    Mobility  Bed Mobility Overal bed mobility: Needs Assistance Bed Mobility: Supine to Sit     Supine to sit: Supervision;HOB elevated     General bed mobility comments: Pulling on bed rails; no physical assist required  Transfers Overall transfer level: Needs assistance Equipment used: 4-wheeled walker Transfers: Sit to/from Stand Sit to Stand: Supervision         General transfer comment: Locks brakes prior to standing without cues; stood from [employer], from rollator seat x1 with supervision.  Ambulation/Gait Ambulation/Gait assistance: Supervision Gait Distance (Feet): 200 Feet Assistive device: 4-wheeled walker Gait Pattern/deviations: Step-through pattern;Decreased stride length;Trunk flexed Gait velocity: Decreased    General Gait Details: Slow, steady amb with rollator, requiring 1x seated rest break secondary to fatigue. 2/4 DOE. Not able to get 02 reading due to cold fingers.   Stairs Stairs: (declined stair training today- "i might try one step but then I'd fall.")           Wheelchair Mobility    Modified Rankin (Stroke Patients Only)       Balance Overall balance assessment: Needs assistance Sitting-balance support: Feet supported;No upper extremity supported Sitting balance-Leahy Scale: Good     Standing balance support: During functional activity Standing balance-Leahy Scale: Fair Standing balance comment: Can static stand without UE support; dynamic stability improved with support                            Cognition Arousal/Alertness: Awake/alert Behavior During Therapy: WFL for tasks assessed/performed Overall Cognitive Status: Impaired/Different from baseline Area of Impairment: Problem solving;Safety/judgement                         Safety/Judgement: Decreased awareness of safety   Problem Solving: Slow processing General Comments: HOH. Mostly WFL for simple tasks, sometimes slow to get answers out and provide information      Exercises      General Comments        Pertinent Vitals/Pain Pain Assessment: Faces Faces Pain Scale: Hurts even more Pain Location: Scrotum Pain Descriptors / Indicators: Grimacing;Sore Pain Intervention(s): Monitored during session;Repositioned    Home Living                      Prior Function  PT Goals (current goals can now be found in the care plan section) Progress towards PT goals: Progressing toward goals    Frequency    Min 3X/week      PT Plan Current plan remains appropriate    Co-evaluation              AM-PAC PT "6 Clicks" Mobility   Outcome Measure  Help needed turning from your back to your side while in a flat bed without using bedrails?: None Help  needed moving from lying on your back to sitting on the side of a flat bed without using bedrails?: None Help needed moving to and from a bed to a chair (including a wheelchair)?: A Little Help needed standing up from a chair using your arms (e.g., wheelchair or bedside chair)?: A Little Help needed to walk in hospital room?: A Little Help needed climbing 3-5 steps with a railing? : A Lot 6 Click Score: 19    End of Session Equipment Utilized During Treatment: Gait belt Activity Tolerance: Patient limited by fatigue Patient left: in bed;with call bell/phone within reach;with nursing/sitter in room Nurse Communication: Mobility status PT Visit Diagnosis: Muscle weakness (generalized) (M62.81);Difficulty in walking, not elsewhere classified (R26.2);Other abnormalities of gait and mobility (R26.89)     Time: 1515-1540 PT Time Calculation (min) (ACUTE ONLY): 25 min  Charges:  $Gait Training: 23-37 mins                     Wray Kearns, [place], DPT Acute Rehabilitation Services Pager [phone] Office [place] 03/21/2018, 3:56 PM

## 2018-03-21 NOTE — Progress Notes (Signed)
Family Medicine Teaching Service Daily Progress Note Intern Pager: [phone]  Patient name: Mario Powell Medical record number: [id number] Date of birth: [birth year] Age: 73 y.o. Gender: male  Primary Care Provider: Wilber Oliphant, MD Consultants: Gastroenterology, Nephrology Code Status: Full code  Pt Overview and Major Events to Date:  Hospital Day 11 12/27: transfused 2 pRBC 12/28: large volume paracentesis (9 liters) 12/29: transfused 2 pRBC 12/31: EGD 01/04: severe hyponatremia improved  Assessment and Plan: Mario Powell is a 73 y.o. male  being treated for pneumonia, chylous ascites and upper GI bleed. PMH is significant for gastric neuroendocrine polyps, history of mitral valve replacement with mechanical valve on chronic anticoagulation, HTN, prostate cancer s/p treatment, paroxysmal atrial fibrillation, anasarca of unknown source, CKD, normocytic anemia, hyponatremia, and chronic left pleural effusion.   Severe chronic hyponatremia: Improving   Na 124 down from 126 yesterday.  Has not received albumin since 1/4.  Nephrology made no changes yesterday, wanted to attempt to see if patient's Na would be stable if albumin not given.   - Nephrology consulted, appreciate recommendations, will f/u today's plan - Continue 2 g sodium and 1200 mL fluid restriction plans while holding diuretics -Per nephrology, after future paracentesis would give albumin IV 12.5 g/L of ascites removed - Telemetry  Acute on chronic anemia: Stable  No bleeding noted on EGD 12/31 and no overt signs of bleeding on exam. S/p transfusion x2 pRBC on 12/27+12/29.  Goal INR 2.5-3.5 for mechanical heart valve.  Hgb this AM 8.5, stable.  INR 2.2. - Monitor CBC - Bridging to Coumadin on heparin with INR goal 2.5 - Protonix 40 mg twice daily  Anasarca with ascites: Improved  s/p -9L fom paracentesis on 12/28. SAAG: 0.7 (unlikely to be from portal vein hypertension), LD 120, triglycerides 1,113, milky, WBCs 342.  SOB resolved. S/p Levaquin (12/26-1/1), started in the ED, for pneumonia coverage. - GI signed off, patient recommendation - Holding home Spironolactone 100 mg daily and torsemide 20 mg daily per nephrology  - Continued work-up with GI and PCP for exact etiology, may require lymphoscintigraphy or similar MR imaging for evaluation of obstruction (referral placed to [facility]) - Incentive spirometry -With future paracentesis nephrology recommends IV albumin 12.5 g/L of ascites removed  AKI on CKD:  Improved Creatinine improved to 2.43 from 2.6 today. Nephrology following.  Overall output -3.1 L since admission. - Nephrology consulted, appreciate recommendations - Holding home Spironolactone 100 mg daily and torsemide 20 mg daily - Avoid nephrotoxic medications as possible - Monitor fluid status closely, strict I and O  Gastric neuroendocrine tumor: Acute, recurrent.  Previously diagnosed and biopsied with small margins via EGD in September.  However recently had an EGD on 12/20 with surgical pathology showing additional well differentiated neuroendocrine tumor extending to the edge of biopsy.  Follows with [facility] and Dr. Annamaria Boots with hematology/oncology. - To follow-up with Dr. [name] outpt, referral placed to [facility]  Bioprosthetic mitral valve: Chronic, stable.  Chronically on anticoagulation with warfarin.    Following EGD and resolution of GI bleed patient is bridging heparin to warfarin with goal of 2.5-3.5 INR per cardiology. - Bridging to Coumadin on heparin with INR goal 2.5  Severe protein-calorie malnutrition: Chronic. Stable on supplements.  Likely also contributing to edema. - Nutrition consulted, appreciate recommendations - Boost breeze nutrition supplement 3 times daily, Protostat 3 times daily, multivitamin  Paroxysmal atrial fibrillation: Chronic, stable. Continues to be in sinus rhythm with HR in 80s.  On chronic warfarin therapy as above, not on any rate  controlling regimen. - Telemetry - d/c cardiac monitoring  History of prostate cancer: Stable.  Patient reports this was 2 decades ago, s/p treatment.  No current concerns. - Monitor outpatient  Chronic hypertension: Stable.  On spiro and torsemide at home. Patient has been more hypotensive during hospitalization, likely 2/2 intravascular volume depletion.  BP 99/67 this AM. - Holding home diuretic therapy in the setting of AKI - Continue Crestor 40 mg daily  Vitamin B12 deficiency: Chronic, stable.  Follows with Dr. Annamaria Boots outpatient for injections monthly, last injection on 11/20. - Consider vitamin B12 injection vs deferment to outpatient  FEN/GI:  Regular diet with 2 g sodium and 1200 mL fluid restriction, protein supplement Prophylaxis:  Heparin bridge to warfarin   Disposition: Home with HH pending nephro clearance and INR in therapeutic range  Subjective:  Patient is feeling well this AM.  Has no complaints.  States that he is no longer having melena.  No other complaints, no abdominal pain, no SOB.  Patient is happy that INR is improving and cardiac telemetry was d/c'ed.  Objective: Temp:  [98 F (36.7 C)-98.2 F (36.8 C)] 98 F (36.7 C) (01/06 0537) Pulse Rate:  [79-84] 81 (01/06 0537) Resp:  [18-20] 18 (01/06 0537) BP: (99-114)/(63-67) 99/67 (01/06 0537) SpO2:  [98 %-100 %] 98 % (01/06 0537) Weight:  [82.1 kg] 82.1 kg (01/06 0537) Intake/Output 01/05 0701 - 01/06 0700 In: 1011.6 [P.O.:600; I.V.:411.6] Out: 1451 [Urine:1450; Stool:1]  Physical Exam: General: 73 y.o. male in NAD Cardio: RRR, mechanical click Lungs: CTAB, no wheezing, no rhonchi, no crackles Abdomen: Soft, distended, non-tender to palpation, positive bowel sounds Skin: warm and dry Extremities: 2+ BUE pitting edema to thighs     Laboratory: Recent Labs  Lab 03/19/18 1423 03/20/18 0419 03/21/18 0533  WBC 3.9* 4.4 4.5  HGB 8.3* 8.2* 8.5*  HCT 25.8* 24.5* 26.2*  PLT 199 221 260    Recent Labs  Lab 03/18/18 0512 03/19/18 0443 03/20/18 0419  NA 122* 126* 126*  K 4.4 4.6 4.8  CL 95* 97* 98  CO2 20* 22 22  BUN 72* 70* 68*  CREATININE 2.67* 2.43* 2.60*  CALCIUM 7.9* 7.8* 8.0*  GLUCOSE 116* 122* 123*    Imaging/Diagnostic Tests: EGD (03/15/2018) Nonbleeding gastric ulcers, no acute bleed present  ULTRASOUND GUIDED RIGHT LOWER QUADRANT PARACENTESIS (03/12/2018) IMPRESSION: Successful ultrasound-guided paracentesis yielding 9 liters of peritoneal fluid.  ECHOCARDIOGRAM COMPLETE (03/11/2018) LVEF 60-65%, normal wall motion, G1DD, bioprosthetic MV mildly thickened  CHEST - LEFT DECUBITUS (03/10/2018) IMPRESSION: Large flowing left pleural effusion.  DG ABDOMEN ACUTE W/ 1V CHEST (03/10/2018) IMPRESSION: Ascites. LEFT pleural effusion and bibasilar atelectasis. Cannot exclude coexistent consolidation in LEFT lower lobe.    Eden Rho, Bernita Raisin, DO 03/21/2018, 7:01 AM PGY-1, [place] Intern pager: [phone], text pages welcome

## 2018-03-22 DIAGNOSIS — D3A8 Other benign neuroendocrine tumors: Secondary | ICD-10-CM

## 2018-03-22 DIAGNOSIS — I10 Essential (primary) hypertension: Secondary | ICD-10-CM

## 2018-03-22 LAB — BASIC METABOLIC PANEL
Anion gap: 5 (ref 5–15)
BUN: 68 mg/dL — ABNORMAL HIGH (ref 8–23)
CO2: 22 mmol/L (ref 22–32)
Calcium: 8 mg/dL — ABNORMAL LOW (ref 8.9–10.3)
Chloride: 97 mmol/L — ABNORMAL LOW (ref 98–111)
Creatinine, Ser: 2.37 mg/dL — ABNORMAL HIGH (ref 0.61–1.24)
GFR calc Af Amer: 30 mL/min — ABNORMAL LOW (ref >60)
GFR, EST NON AFRICAN AMERICAN: 26 mL/min — AB (ref >60)
Glucose, Bld: 133 mg/dL — ABNORMAL HIGH (ref 70–99)
Potassium: 5.1 mmol/L (ref 3.5–5.1)
SODIUM: 124 mmol/L — AB (ref 135–145)

## 2018-03-22 LAB — CBC
HCT: 25.6 % — ABNORMAL LOW (ref 39.0–52.0)
Hemoglobin: 8.4 g/dL — ABNORMAL LOW (ref 13.0–17.0)
MCH: 30.7 pg (ref 26.0–34.0)
MCHC: 32.8 g/dL (ref 30.0–36.0)
MCV: 93.4 fL (ref 80.0–100.0)
PLATELETS: 291 10*3/uL (ref 150–400)
RBC: 2.74 MIL/uL — ABNORMAL LOW (ref 4.22–5.81)
RDW: 17.1 % — ABNORMAL HIGH (ref 11.5–15.5)
WBC: 5.7 10*3/uL (ref 4.0–10.5)
nRBC: 0 % (ref 0.0–0.2)

## 2018-03-22 LAB — PROTIME-INR
INR: 2.57
Prothrombin Time: 27.2 seconds — ABNORMAL HIGH (ref 11.4–15.2)

## 2018-03-22 LAB — HEPARIN LEVEL (UNFRACTIONATED): HEPARIN UNFRACTIONATED: 0.6 [IU]/mL (ref 0.30–0.70)

## 2018-03-22 MED ORDER — PANTOPRAZOLE SODIUM 40 MG PO TBEC: 40.0000 mg | DELAYED_RELEASE_TABLET | Freq: Two times a day (BID) | ORAL | 3 refills | Status: AC

## 2018-03-22 MED ORDER — WARFARIN SODIUM 1 MG PO TABS
1.0000 mg | ORAL_TABLET | Freq: Once | ORAL | Status: AC
  Administered 2018-03-22: 1 mg via ORAL
  Filled 2018-03-22: qty 1

## 2018-03-22 NOTE — Discharge Instructions (Signed)
Please continue to follow a 1.2 L fluid restriction and 2g sodium diet.  Continue to take warfarin at the same dose you used to take at home.  You will have your INR checked on 1/9.  Please ensure that you go to all appointments for follow-up with specialists.  Scheduled with Dr. Jacqulynn Cadet at [facility] at [address] on January 14th at 12:45 PM. Arrive at 12:30.

## 2018-03-22 NOTE — Progress Notes (Signed)
Occupational Therapy Treatment Patient Details Name: Mario Powell MRN: [id number] DOB: September 24, 1945 Today's Date: 03/22/2018    History of present illness Pt is a 73 y.o. male admitted 03/10/18 with SOB and darkened stools. Imaging showed ascites with L pleural effusion and atelectasis. S/p paracentesis 12/28. Pt with hyponatremia and AKI on CKD. S/p EGD 12/31. PMH includes a fib on Coumadin, CAD, HTN, MVR, anemia, prostate CA.   OT comments  Pt required encouragement to participate in OT. Pt declined ambulating to bathroom with OT, however agreeable to sit - stand and SPT at recliner and bed and to simulated ADL tasks. Pt limited by fatigue and R thigh edematous scrotum. Pt to possibly d/c home today and reports that he will have assist from his daughter.  Follow Up Recommendations  Home health OT    Equipment Recommendations  3 in 1 bedside commode;Other (comment)(reacher)    Recommendations for Other Services      Precautions / Restrictions Precautions Precautions: Fall Precaution Comments: Swollen painful scrotum Restrictions Weight Bearing Restrictions: No       Mobility Bed Mobility Overal bed mobility: Needs Assistance Bed Mobility: Supine to Sit     Supine to sit: HOB elevated;Min guard     General bed mobility comments: pt in recliner upon arrival  Transfers Overall transfer level: Needs assistance Equipment used: 4-wheeled walker Transfers: Sit to/from [facility] Sit to Stand: Min guard Stand pivot transfers: Min guard       General transfer comment: Min guard for safety. Required increased time and effort to stand secondary to RLE pain. Locked brakes prior to standing without cues.     Balance Overall balance assessment: Needs assistance Sitting-balance support: Feet supported;No upper extremity supported Sitting balance-Leahy Scale: Good     Standing balance support: During functional activity;Bilateral upper extremity  supported Standing balance-Leahy Scale: Poor Standing balance comment: Requires UE support                            ADL either performed or assessed with clinical judgement   ADL Overall ADL's : Needs assistance/impaired     Grooming: Min guard;Wash/dry hands;Wash/dry face;Standing       Lower Body Bathing: Min guard;Sit to/from stand Lower Body Bathing Details (indicate cue type and reason): simulated         Toilet Transfer: Ambulation;RW;Min guard Toilet Transfer Details (indicate cue type and reason): simulated recliner - bed - recliner Toileting- Clothing Manipulation and Hygiene: Sit to/from stand;Min guard       Functional mobility during ADLs: Rolling walker;Min guard General ADL Comments: pt refused to ambulate to bathroom ro to sink with OT     Vision Patient Visual Report: No change from baseline     Perception     Praxis      Cognition Arousal/Alertness: Awake/alert Behavior During Therapy: WFL for tasks assessed/performed Overall Cognitive Status: Impaired/Different from baseline Area of Impairment: Problem solving;Safety/judgement                         Safety/Judgement: Decreased awareness of safety   Problem Solving: Slow processing General Comments: HOH. Mostly WFL for simple tasks, sometimes slow to get answers out and provide information        Exercises     Shoulder Instructions       General Comments      Pertinent Vitals/ Pain       Pain Assessment: Faces  Faces Pain Scale: Hurts little more Pain Location: Scrotum and R thigh  Pain Descriptors / Indicators: Grimacing;Sore Pain Intervention(s): Limited activity within patient's tolerance;Monitored during session;Repositioned  Home Living                                          Prior Functioning/Environment              Frequency  Min 2X/week        Progress Toward Goals  OT Goals(current goals can now be found in the  care plan section)  Progress towards OT goals: OT to reassess next treatment  Acute Rehab OT Goals Patient Stated Goal: to get stronger and back to my normal  Plan Discharge plan needs to be updated    Co-evaluation                 AM-PAC OT "6 Clicks" Daily Activity     Outcome Measure   Help from another person eating meals?: None Help from another person taking care of personal grooming?: A Little Help from another person toileting, which includes using toliet, bedpan, or urinal?: A Little Help from another person bathing (including washing, rinsing, drying)?: A Little Help from another person to put on and taking off regular upper body clothing?: A Little Help from another person to put on and taking off regular lower body clothing?: A Little 6 Click Score: 19    End of Session Equipment Utilized During Treatment: Gait belt;Rolling walker  OT Visit Diagnosis: Unsteadiness on feet (R26.81);Muscle weakness (generalized) (M62.81);Pain Pain - Right/Left: Right Pain - part of body: Leg   Activity Tolerance Patient limited by fatigue;Patient limited by pain   Patient Left with call bell/phone within reach;in chair;with chair alarm set   Nurse Communication          Time: 7121-9758 OT Time Calculation (min): 19 min  Charges: OT General Charges $OT Visit: 1 Visit OT Treatments $Therapeutic Activity: 8-22 mins     Britt Bottom 03/22/2018, 2:01 PM

## 2018-03-22 NOTE — Progress Notes (Signed)
Physical Therapy Treatment Patient Details Name: Mario Powell MRN: [id number] DOB: 1945/09/30 Today's Date: 03/22/2018    History of Present Illness Pt is a 73 y.o. male admitted 03/10/18 with SOB and darkened stools. Imaging showed ascites with L pleural effusion and atelectasis. S/p paracentesis 12/28. Pt with hyponatremia and AKI on CKD. S/p EGD 12/31. PMH includes a fib on Coumadin, CAD, HTN, MVR, anemia, prostate CA.    PT Comments    Pt progressing towards goals and able to perform stair training this session. Required min guard A to practice 5 steps, however, pt fatiguing easily. Pt also complaining of RLE pain which limited mobility. For gait, pt required min guard to supervision using rollator. Educated about need for assist for stair navigation at home. Reports daughter can assist with stair navigation at home, however, reports he will likely be staying on the first floor initially. Pt reports he feels comfortable going home at d/c. Current recommendations appropriate, as long as daughter able to provide necessary assist. Will continue to follow acutely to maximize functional mobility independence and safety.    Follow Up Recommendations  Home health PT;Supervision for mobility/OOB     Equipment Recommendations  None recommended by PT    Recommendations for Other Services       Precautions / Restrictions Precautions Precautions: Fall Restrictions Weight Bearing Restrictions: No    Mobility  Bed Mobility Overal bed mobility: Needs Assistance Bed Mobility: Supine to Sit     Supine to sit: HOB elevated;Min guard     General bed mobility comments: Min guard for guarding of RLE as pt with increased pain. Required increased time and pulled on bed rails.   Transfers Overall transfer level: Needs assistance Equipment used: 4-wheeled walker Transfers: Sit to/from [facility] Sit to Stand: Min guard Stand pivot transfers: Min guard       General  transfer comment: Min guard for safety. Required increased time and effort to stand secondary to RLE pain. Locked brakes prior to standing without cues.   Ambulation/Gait Ambulation/Gait assistance: Supervision;Min guard Gait Distance (Feet): 100 Feet Assistive device: 4-wheeled walker Gait Pattern/deviations: Step-through pattern;Decreased stride length;Trunk flexed Gait velocity: Decreased   General Gait Details: Slow, overall steady during ambulation with rollator. Only able to tolerate short distance following stair training secondary to fatigue.    Stairs Stairs: Yes Stairs assistance: Min guard Stair Management: Two rails;Step to pattern;Forwards Number of Stairs: 5 General stair comments: Practiced ascending/descending one step X 5 with bilateral rails. Increased effort required and required cues for LE sequencing. No physical assist required, however, did require min guard for safety. Educated about having chair at top of steps for seated brake. Also educated about how daughter should guard and assist pt.    Wheelchair Mobility    Modified Rankin (Stroke Patients Only)       Balance Overall balance assessment: Needs assistance Sitting-balance support: Feet supported;No upper extremity supported Sitting balance-Leahy Scale: Good     Standing balance support: During functional activity;Bilateral upper extremity supported Standing balance-Leahy Scale: Poor Standing balance comment: Requires UE support                             Cognition Arousal/Alertness: Awake/alert Behavior During Therapy: WFL for tasks assessed/performed Overall Cognitive Status: Impaired/Different from baseline Area of Impairment: Problem solving;Safety/judgement  Safety/Judgement: Decreased awareness of safety   Problem Solving: Slow processing General Comments: HOH. Mostly WFL for simple tasks, sometimes slow to get answers out and provide  information      Exercises      General Comments        Pertinent Vitals/Pain Pain Assessment: Faces Faces Pain Scale: Hurts even more Pain Location: Scrotum and R thigh  Pain Descriptors / Indicators: Grimacing;Sore Pain Intervention(s): Limited activity within patient's tolerance;Monitored during session;Repositioned    Home Living                      Prior Function            PT Goals (current goals can now be found in the care plan section) Acute Rehab PT Goals Patient Stated Goal: to get stronger and back to my normal PT Goal Formulation: With patient Time For Goal Achievement: 03/27/18 Potential to Achieve Goals: Good Progress towards PT goals: Progressing toward goals    Frequency    Min 3X/week      PT Plan Current plan remains appropriate    Co-evaluation              AM-PAC PT "6 Clicks" Mobility   Outcome Measure  Help needed turning from your back to your side while in a flat bed without using bedrails?: None Help needed moving from lying on your back to sitting on the side of a flat bed without using bedrails?: A Little Help needed moving to and from a bed to a chair (including a wheelchair)?: A Little Help needed standing up from a chair using your arms (e.g., wheelchair or bedside chair)?: A Little Help needed to walk in hospital room?: A Little Help needed climbing 3-5 steps with a railing? : A Little 6 Click Score: 19    End of Session Equipment Utilized During Treatment: Gait belt Activity Tolerance: Patient limited by fatigue Patient left: in chair;with call bell/phone within reach;with chair alarm set Nurse Communication: Mobility status PT Visit Diagnosis: Muscle weakness (generalized) (M62.81);Difficulty in walking, not elsewhere classified (R26.2);Other abnormalities of gait and mobility (R26.89)     Time: 0938-1829 PT Time Calculation (min) (ACUTE ONLY): 30 min  Charges:  $Gait Training: 23-37 mins                      Leighton Ruff, PT, DPT  Acute Rehabilitation Services  Pager: [phone] Office: [phone]    Rudean Hitt 03/22/2018, 11:15 AM

## 2018-03-22 NOTE — Progress Notes (Signed)
[facility] for heparin and warfarin Indication: heparin bridge while INR < 2.5  Allergies  Allergen Reactions  . Penicillins Other (See Comments)    DID THE REACTION INVOLVE: Swelling of the face/tongue/throat, SOB, or low BP? Unknown Sudden or severe rash/hives, skin peeling, or the inside of the mouth or nose? Unknown Did it require medical treatment? Yes When did it last happen?When pt was 73 years old Pt knows that his parents had to take him to the ER at the hospital and has not been retested with any PCN or cephalosporin that he is aware of.    Patient Measurements: Height: 5\' 11"  (180.3 cm) Weight: 182 lb 9.6 oz (82.8 kg) IBW/kg (Calculated) : 75.3 Heparin Dosing Weight: 77.7 kg (actual body weight)  Vital Signs: Temp: 97.4 F (36.3 C) (01/07 0558) Temp Source: Oral (01/07 0558) BP: 107/66 (01/07 0558) Pulse Rate: 87 (01/07 0558)  Labs: Recent Labs    03/20/18 0419 03/20/18 0853 03/21/18 0533 03/21/18 0813 03/22/18 0448  HGB 8.2*  --  8.5*  --  8.4*  HCT 24.5*  --  26.2*  --  25.6*  PLT 221  --  260  --  291  LABPROT 21.1*  --  24.1*  --  27.2*  INR 1.85  --  2.20  --  2.57  HEPARINUNFRC  --  0.55  --  0.76* 0.60  CREATININE 2.60*  --  2.43*  --  2.37*    Estimated Creatinine Clearance: 29.1 mL/min (A) (by C-G formula based on SCr of 2.37 mg/dL (H)).  Assessment: 53 yoM admitted 12/26 with complains of shortness of breath and dark stools. PTA patient is on warfarin for mechanical MVR and atrial fibrillation. Warfarin held from 12/27-12/30 (last dose 12/27 at 0135) since patient had a GI bleed requiring transfusion. Warfarin resumed 12/31. INR subtherapeutic on 03/15/18 ,thus pharmacy was consulted to initiate heparin bridge until therapeutic INR.   INR at goal at 2.57.  H&H low stable, plt 291.  PTA warfarin dose: 1mg  daily except for 0.5mg  on M/W/F  Goal of Therapy:  Heparin level 0.3-0.7 units/ml INR 2.5 -  3.5 Monitor platelets by anticoagulation protocol: Yes   Plan:  Resume home dose warfarin, 1 mg PO tonight Discontinue heparin  Monitor daily INR Monitor for s/sx of bleeding  Thank you for allowing pharmacy to be a part of this patient's care.  Azzie Roup D PGY1 Pharmacy Resident  Phone [phone] Please use [facility] for clinical pharmacists numbers  03/22/2018      10:56 AM

## 2018-03-22 NOTE — Progress Notes (Addendum)
[facility] Progress Note    Assessment/ Plan:   1.  Hypervolemic hyponatremia.  Given his edema and ascites, he falls into the hypervolemic classification.  However, he clearly has market intravascular volume depletion as a continued stimulus for hyponatremia.  Given his severe hypoalbuminemia and recurrent chylous ascites, I doubt he will really have a normal serum sodium unless this has resolved.  His serum sodium appears to be stable in the mid 120s.  He was volume expanded with albumin 25 g on 03/19/2018.    - Continue  2 g sodium and 1200 mL fluid restriction.  Needs to have IV albumin 12.5 g/L of ascites removed with large volume paracentesis. - SNa is stable for d/c from renal standpoint.  2.  Chronic kidney disease stage IV.  His baseline serum creatinine runs around 2. - Needs f/u with [facility] in 1 mth after d/c.  3.  Acute kidney injury.  His creatinine peaked around 3.6 on 12/27 and has improved.  He tends to improve after volume expansion.  4.  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  He has improvement in his lower extremity edema with large-volume paracenteses suggesting some impaired venous return from vascular compression from the ascites.  Has plan for ultimate work-up of chylous ascites at [facility].  5.  Acute blood loss anemia.  Hemoglobin appears stable today.  No indication for ESA.  6.  A. fib/mechanical valve.  On Coumadin  Subjective:   Wants to go home. His leg swelling is much improved c/w previous (but still 2-3+ edema). Denies f/cn/v/dyspnea.   Objective:   BP 106/70 (BP Location: Left Arm)   Pulse 84   Temp (!) 97.4 F (36.3 C) (Oral)   Resp 18   Ht 5\' 11"  (1.803 m)   Wt 82.8 kg   SpO2 100%   BMI 25.47 kg/m   Intake/Output Summary (Last 24 hours) at 03/22/2018 1753 Last data filed at 03/22/2018 1734 Gross per 24 hour  Intake 1563.37 ml  Output 1150 ml  Net 413.37 ml   Weight change: 0.726 kg  Physical Exam: General:   A&Ox3 NAD chronically ill-appearing HEENT: MMM [place] AT anicteric sclera CV:  Heart RRR  Lungs:  L/S CTA bilaterally with decreased breath sounds at the bases Abd: Abdomen soft, mildly distended, nontender to palpation  Extremities: 2+ bilateral lower extremity edema. GU: condom cath  Imaging: No results found.  Labs: BMET Recent Labs  Lab 03/16/18 1620 03/17/18 0358 03/18/18 0512 03/19/18 0443 03/20/18 0419 03/21/18 0533 03/22/18 0448  NA 121* 122* 122* 126* 126* 124* 124*  K 4.7 4.5 4.4 4.6 4.8 4.9 5.1  CL 92* 91* 95* 97* 98 96* 97*  CO2 21* 24 20* 22 22 21* 22  GLUCOSE 123* 126* 116* 122* 123* 117* 133*  BUN 96* 78* 72* 70* 68* 69* 68*  CREATININE 2.50* 2.72* 2.67* 2.43* 2.60* 2.43* 2.37*  CALCIUM 7.5* 7.6* 7.9* 7.8* 8.0* 7.8* 8.0*   CBC Recent Labs  Lab 03/19/18 1423 03/20/18 0419 03/21/18 0533 03/22/18 0448  WBC 3.9* 4.4 4.5 5.7  HGB 8.3* 8.2* 8.5* 8.4*  HCT 25.8* 24.5* 26.2* 25.6*  MCV 93.8 95.0 93.6 93.4  PLT 199 221 260 291    Medications:    . ezetimibe  10 mg Oral Daily  . feeding supplement  1 Container Oral BID BM  . feeding supplement (PRO-STAT SUGAR FREE 64)  30 mL Oral TID BM  . multivitamin with minerals  1 tablet Oral Daily  .  pantoprazole  40 mg Oral BID  . rosuvastatin  40 mg Oral Daily  . Warfarin - Pharmacist Dosing Inpatient   Does not apply q1800      Otelia Santee, MD 03/22/2018, 5:53 PM

## 2018-03-22 NOTE — Progress Notes (Signed)
Family Medicine Teaching Service Daily Progress Note Intern Pager: [phone]  Patient name: Mario Powell Medical record number: [id number] Date of birth: Oct 23, 1945 Age: 73 y.o. Gender: male  Primary Care Provider: Wilber Oliphant, MD Consultants: Gastroenterology, Nephrology Code Status: Full code  Pt Overview and Major Events to Date:  Hospital Day 12 12/27: transfused 2 pRBC 12/28: large volume paracentesis (9 liters) 12/29: transfused 2 pRBC 12/31: EGD 01/04: Mario hyponatremia improved  Assessment and Plan: Mario Powell is a 73 y.o. male  being treated for pneumonia, chylous ascites and upper GI bleed. PMH is significant for gastric neuroendocrine polyps, history of mitral valve replacement with mechanical valve on chronic anticoagulation, HTN, prostate cancer s/p treatment, paroxysmal atrial fibrillation, anasarca of unknown source, CKD, normocytic anemia, hyponatremia, and chronic left pleural effusion.   Mario chronic hyponatremia: Improving   Na stable at 124 today.  Per nephrology, this is hypervolemic hyponatremia with intravascular volume depletion, Mario hypoalbuminemia, recurrent chylous ascites.  Held volume expansion yesterday and will continue to monitor serum sodium. - Nephrology consulted, appreciate recommendations, will f/u for clearance for D/C - Continue 2 g sodium and 1200 mL fluid restriction plans while holding diuretics -Per nephrology, after future paracentesis would give albumin IV 12.5 g/L of ascites removed - Telemetry  Acute on chronic anemia: Stable, INR at goal No bleeding noted on EGD 12/31.  S/p transfusion x2 pRBC on 12/27+12/29.  Goal INR 2.5-3.5 for mechanical heart valve.  Patient did have nosebleed yesterday that quickly resolved.  Hemoglobin this a.m. stable at 8.4.  INR therapeutic at 2.57. - Monitor CBC - Bridging to Coumadin on heparin with INR goal 2.5, now at goal - Protonix 40 mg twice daily - will make appointment for patient at INR  clinic this week prior to d/c  Anasarca with ascites: Improved  s/p -9L fom paracentesis on 12/28. SAAG: 0.7 (unlikely to be from portal vein hypertension), LD 120, triglycerides 1,113, milky, WBCs 342. SOB resolved. S/p Levaquin (12/26-1/1), started in the ED, for pneumonia coverage. - GI signed off, patient recommendation - Holding home Spironolactone 100 mg daily and torsemide 20 mg daily per nephrology  - Continued work-up with GI and PCP for exact etiology, may require lymphoscintigraphy or similar MR imaging for evaluation of obstruction (referral placed to [facility]) - Incentive spirometry -With future paracentesis nephrology recommends IV albumin 12.5 g/L of ascites removed  AKI on CKD:  Improved Creatinine today improved to 2.37 from 2.43.  Nephrology following.  Not I's/O's since admission -3.3 L. - Nephrology consulted, appreciate recommendations - Holding home Spironolactone 100 mg daily and torsemide 20 mg daily - Avoid nephrotoxic medications as possible - Monitor fluid status closely, strict I and O  Gastric neuroendocrine tumor: Acute, recurrent.  Previously diagnosed and biopsied with small margins via EGD in September.  However recently had an EGD on 12/20 with surgical pathology showing additional well differentiated neuroendocrine tumor extending to the edge of biopsy.  Follows with [facility] and Dr. Annamaria Boots with hematology/oncology. - To follow-up with Dr. [name] outpt, referral placed to [facility]  Bioprosthetic mitral valve: Chronic, stable.  Chronically on anticoagulation with warfarin.    Following EGD and resolution of GI bleed patient is bridging heparin to warfarin with goal of 2.5-3.5 INR per cardiology. - INR now at goal, patient will d/c home on home warfarin dose per pharmacy  Mario Powell: Chronic. Stable on supplements.  Likely also contributing to edema. - Nutrition consulted, appreciate recommendations -  Boost breeze  nutrition supplement 3 times daily, Protostat 3 times daily, multivitamin  Paroxysmal atrial fibrillation: Chronic, stable. On chronic warfarin therapy as above, not on any rate controlling regimen.  Heart rate continues to be well controlled in the 80s. - Telemetry - d/c cardiac monitoring  History of prostate cancer: Stable.  Patient reports this was 2 decades ago, s/p treatment.  No current concerns. - Monitor outpatient  Chronic hypertension: Stable.  On spiro and torsemide at home. Patient has been more hypotensive during hospitalization, likely 2/2 intravascular volume depletion.  BP 99/67 this AM. - Holding home diuretic therapy in the setting of AKI - Continue Crestor 40 mg daily  Vitamin B12 deficiency: Chronic, stable.  Follows with Dr. Annamaria Boots outpatient for injections monthly, last injection on 11/20. - Consider vitamin B12 injection vs deferment to outpatient  FEN/GI:  Regular diet with 2 g sodium and 1200 mL fluid restriction, protein supplement Prophylaxis:  Heparin bridge to warfarin   Disposition: Home with [facility] pending nephro clearance, INR now therapeutic  Subjective:  Patient has no complaints this morning.  Denies chest pain, abdominal pain.  Denies any new swelling.  Denies any bleeding aside from slight nosebleed last night.  Objective: Temp:  [97.4 F (36.3 C)-97.7 F (36.5 C)] 97.4 F (36.3 C) (01/07 0558) Pulse Rate:  [77-87] 87 (01/07 0558) Resp:  [18-20] 20 (01/07 0558) BP: (107-117)/(64-67) 107/66 (01/07 0558) SpO2:  [97 %-100 %] 97 % (01/07 0558) Weight:  [82.8 kg] 82.8 kg (01/07 0558) Intake/Output 01/06 0701 - 01/07 0700 In: 963.4 [P.O.:600; I.V.:363.4] Out: 1152 [Urine:1150; Stool:2]  Physical Exam: General: 73 y.o. male in NAD, lying in bed Cardio: RRR mechanical click Lungs: CTAB, no wheezing, no rhonchi, no crackles, no increased work of breathing Abdomen: Soft, distended, non-tender to palpation, positive bowel sounds Skin: warm and  dry Extremities: 2+ pitting edema to thighs    Laboratory: Recent Labs  Lab 03/20/18 0419 03/21/18 0533 03/22/18 0448  WBC 4.4 4.5 5.7  HGB 8.2* 8.5* 8.4*  HCT 24.5* 26.2* 25.6*  PLT 221 260 291   Recent Labs  Lab 03/20/18 0419 03/21/18 0533 03/22/18 0448  NA 126* 124* 124*  K 4.8 4.9 5.1  CL 98 96* 97*  CO2 22 21* 22  BUN 68* 69* 68*  CREATININE 2.60* 2.43* 2.37*  CALCIUM 8.0* 7.8* 8.0*  GLUCOSE 123* 117* 133*    Imaging/Diagnostic Tests: EGD (03/15/2018) Nonbleeding gastric ulcers, no acute bleed present  ULTRASOUND GUIDED RIGHT LOWER QUADRANT PARACENTESIS (03/12/2018) IMPRESSION: Successful ultrasound-guided paracentesis yielding 9 liters of peritoneal fluid.  ECHOCARDIOGRAM COMPLETE (03/11/2018) LVEF 60-65%, normal wall motion, G1DD, bioprosthetic MV mildly thickened  CHEST - LEFT DECUBITUS (03/10/2018) IMPRESSION: Large flowing left pleural effusion.  DG ABDOMEN ACUTE W/ 1V CHEST (03/10/2018) IMPRESSION: Ascites. LEFT pleural effusion and bibasilar atelectasis. Cannot exclude coexistent consolidation in LEFT lower lobe.    [place], DO 03/22/2018, 7:40 AM PGY-1, [facility] Intern pager: [phone], text pages welcome

## 2018-03-23 ENCOUNTER — Ambulatory Visit: Payer: Medicare Other | Admitting: Family Medicine

## 2018-03-24 ENCOUNTER — Ambulatory Visit (INDEPENDENT_AMBULATORY_CARE_PROVIDER_SITE_OTHER): Payer: Medicare Other | Admitting: *Deleted

## 2018-03-24 DIAGNOSIS — Z952 Presence of prosthetic heart valve: Secondary | ICD-10-CM

## 2018-03-24 DIAGNOSIS — Z5181 Encounter for therapeutic drug level monitoring: Secondary | ICD-10-CM | POA: Diagnosis not present

## 2018-03-24 LAB — POCT INR: INR: 2.6 (ref 2.0–3.0)

## 2018-03-24 NOTE — Patient Instructions (Signed)
Description   Start taking 1 tablet daily except 1/2 tablet on Mondays and Wednesdays. Recheck INR in 1 week. Call when procedure is scheduled [phone]

## 2018-03-25 ENCOUNTER — Telehealth: Payer: Self-pay

## 2018-03-25 NOTE — Telephone Encounter (Signed)
Patient daughter calling to ask about where to get paracentesis and how she arranges that? Is it through this office or another doctor? States her father needs one.  Clarise Cruz call back is [phone]  Danley Danker, RN [facility] RN)

## 2018-03-26 NOTE — Telephone Encounter (Signed)
Replied to Judson Roch over the phone. Keeping in close contact to ensure proper outpatient care for patient

## 2018-03-27 ENCOUNTER — Other Ambulatory Visit: Payer: Self-pay | Admitting: Interventional Cardiology

## 2018-03-28 ENCOUNTER — Other Ambulatory Visit: Payer: Self-pay | Admitting: Family Medicine

## 2018-03-28 DIAGNOSIS — I898 Other specified noninfective disorders of lymphatic vessels and lymph nodes: Secondary | ICD-10-CM

## 2018-03-29 ENCOUNTER — Ambulatory Visit
Admission: RE | Admit: 2018-03-29 | Discharge: 2018-03-29 | Disposition: A | Discharge status: Home / Self Care | Payer: Medicare Other | Source: Ambulatory Visit | Attending: Family Medicine | Admitting: Family Medicine

## 2018-03-29 ENCOUNTER — Other Ambulatory Visit: Payer: Medicare Other

## 2018-03-29 ENCOUNTER — Other Ambulatory Visit: Payer: Self-pay | Admitting: Interventional Radiology

## 2018-03-29 ENCOUNTER — Encounter: Payer: Self-pay | Admitting: Radiology

## 2018-03-29 DIAGNOSIS — R188 Other ascites: Secondary | ICD-10-CM

## 2018-03-29 DIAGNOSIS — I898 Other specified noninfective disorders of lymphatic vessels and lymph nodes: Secondary | ICD-10-CM

## 2018-03-29 HISTORY — PX: IR RADIOLOGIST EVAL & MGMT: IMG5224

## 2018-03-29 NOTE — Consult Note (Signed)
Chief Complaint: Patient was seen in consultation today for  Chief Complaint  Patient presents with  . Consult    Consult for Chylous Ascites   at the request of Martyn Malay  Referring Physician(s): [name] M  History of Present Illness: Mario Powell is a 73 y.o. 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  Mario Powell has struggled with bilateral lower extremity lymphedema for nearly the past 20 years dating back to 2001.  This seemed to develop following his mitral valve replacement and coronary artery bypass graft with saphenous vein harvest.  More recently, in September 2019 he presented to the emergency room with shortness of breath and abdominal distention and was found to have large volume ascites.  This was tapped and appeared grossly chylous.  Evaluation of the triglycerides demonstrated triglycerides over 700 diagnostic of a chylous effusion.  He was also found to have bilateral pleural effusions at that time.  This initiated a rather extensive work-up.  He underwent upper and lower endoscopy which demonstrated gastritis, inflammatory gastric polyps with evidence of recent bleeding, and colonic polyps.  All the polyps were snared and removed, he underwent cauterization of previous sites of bleeding.  Work-up for right-sided heart failure and cirrhosis were negative.  He developed acute on chronic renal failure and also had severe electrolyte abnormalities, chronic hyponatremia and severe protein calorie malnutrition.  He had a very prolonged stay in the hospital before finally being stable for discharge home with home health.  He has now had 3 large-volume paracenteses, the most recent on 03/12/2018 when 9 L was removed.  Thus far, a definitive cause for his chylous ascites has not been identified.  There is some thought that his underlying hypoalbuminemia may be an exacerbating factor in the setting of chronic kidney disease.   Additionally, he has a history of prostate cancer and has undergone pelvic radiation in the past and he has a history of prior gastric carcinoid tumor.  Mario Powell is very hard of hearing and speaks very slowly with significant difficulty in word finding.  It took him an exceptionally long time to relay his history and concerns to his satisfaction.  Similarly, our discussion from that point forward was extensive and very time-consuming.  Since his discharge from the hospital, he has been very attentive at forcing himself to eat 3 meals a day and to eat everything on his plate.  When he was initially discharged he had tremendous fatigue and could barely sit up.  This caused a development of a decubitus ulcer which she is currently treating.  However, as he has continued to eat and regain his strength he is now able to sit up and ambulate about the house.  He feels like his strength is slowly coming back.  He also feels like his fluid is not reaccumulating as quickly as it was before and he is hopeful that his body is healing and that this process is resolving.  His current primary complaint is fatigue and feeling like he is not back to his high functioning baseline that he had prior to September 2019.  Prior imaging studies have been limited due to his poor renal function and inability to receive either CT or MRI contrast.  Past Medical History:  Diagnosis Date  . Anasarca 12/01/2017  . Atrial fibrillation ([place]) 07/12/2013   Perioperative, 2001   . CAD (coronary artery disease) of artery bypass graft 07/12/2013   Saphenous vein graft 2001   .  Chronic anticoagulation 07/12/2013  . Essential hypertension 07/12/2013  . History of mitral valve replacement with mechanical valve 01/11/2013   Mechanical mitral valve 2001  Bacterial endocarditis as the cause   . HOH (hard of hearing)   . Prostate cancer ([place]) 07/12/2013    Past Surgical History:  Procedure Laterality Date  . BIOPSY  12/06/2017   Procedure:  BIOPSY;  Surgeon: Otis Brace, MD;  Location: [place];  Service: Gastroenterology;;  . BIOPSY  03/04/2018   Procedure: BIOPSY;  Surgeon: Otis Brace, MD;  Location: [facility];  Service: Gastroenterology;;  . COLONOSCOPY WITH PROPOFOL N/A 12/06/2017   Procedure: COLONOSCOPY WITH PROPOFOL ;  Surgeon: Otis Brace, MD;  Location: [place];  Service: Gastroenterology;  Laterality: N/A;  . CORONARY ARTERY BYPASS GRAFT     2001  . ESOPHAGOGASTRODUODENOSCOPY (EGD) WITH PROPOFOL N/A 12/06/2017   Procedure: ESOPHAGOGASTRODUODENOSCOPY (EGD) WITH PROPOFOL;  Surgeon: Otis Brace, MD;  Location: [place];  Service: Gastroenterology;  Laterality: N/A;  . ESOPHAGOGASTRODUODENOSCOPY (EGD) WITH PROPOFOL N/A 03/04/2018   Procedure: ESOPHAGOGASTRODUODENOSCOPY (EGD) WITH PROPOFOL;  Surgeon: Otis Brace, MD;  Location: [facility];  Service: Gastroenterology;  Laterality: N/A;  . ESOPHAGOGASTRODUODENOSCOPY (EGD) WITH PROPOFOL N/A 03/15/2018   Procedure: ESOPHAGOGASTRODUODENOSCOPY (EGD) WITH PROPOFOL;  Surgeon: Ronnette Juniper, MD;  Location: [place];  Service: Gastroenterology;  Laterality: N/A;  . IR PARACENTESIS  12/03/2017  . IR PARACENTESIS  01/04/2018  . IR PARACENTESIS  02/04/2018  . IR RADIOLOGIST EVAL & MGMT  03/29/2018  . MITRAL VALVE REPLACEMENT  2001   Mechanical prosthesis  . POLYPECTOMY  12/06/2017   Procedure: POLYPECTOMY;  Surgeon: Otis Brace, MD;  Location: [facility] ENDOSCOPY;  Service: Gastroenterology;;  . POLYPECTOMY  03/04/2018   Procedure: POLYPECTOMY;  Surgeon: Otis Brace, MD;  Location: [facility];  Service: Gastroenterology;;    Allergies: Penicillins  Medications: Prior to Admission medications   Medication Sig Start Date End Date Taking? Authorizing Provider  Amino Acids-Protein Hydrolys (FEEDING SUPPLEMENT, PRO-STAT SUGAR FREE 64,) LIQD Take 30 mLs by mouth 3 (three) times daily with meals. Patient taking differently: Take 30  mLs by mouth every evening.  12/09/17  Yes Wilber Oliphant, MD  calcium-vitamin D (OSCAL WITH D) 500-200 MG-UNIT tablet Take 1 tablet by mouth 3 (three) times daily.    Yes [provider]  Camphor-Eucalyptus-Menthol (VICKS VAPORUB EX) Apply 1 application topically See admin instructions. Apply under nose daily in the evening   Yes [provider]  Cholecalciferol (VITAMIN D-3) 25 MCG (1000 UT) CAPS Take 1,000 Units by mouth daily.   Yes [provider]  ezetimibe (ZETIA) 10 MG tablet Take 1 tablet (10 mg total) by mouth daily. PLEASE CALL TO SCHEDULE F/U APPT FOR FUTURE REFILLS.Marland Kitchen3RD AND FINAL ATTEMPT! 02/25/18  Yes Belva Crome, MD  medium chain triglycerides (MCT OIL) oil Take 15 mLs by mouth 3 (three) times daily with meals. Patient taking differently: Take 15 mLs by mouth at bedtime.  12/08/17  Yes Wilber Oliphant, MD  Multiple Vitamin (MULTIVITAMIN WITH MINERALS) TABS tablet Take 1 tablet by mouth daily. 12/09/17  Yes Wilber Oliphant, MD  Omega-3 Fatty Acids (FISH OIL) 1000 MG CAPS Take 2,000 mg by mouth daily.   Yes [provider]  pantoprazole (PROTONIX) 40 MG tablet Take 1 tablet (40 mg total) by mouth 2 (two) times daily. 03/22/18  Yes Meccariello, Bernita Raisin, DO  rosuvastatin (CRESTOR) 40 MG tablet Take 1 tablet (40 mg total) by mouth daily. PLEASE CALL TO SCHEDULE F/U APPT  FOR FUTURE REFILLS.Marland Kitchen3RD AND FINAL ATTEMPT! 02/25/18  Yes Belva Crome, MD  warfarin (COUMADIN) 1 MG tablet TAKE AS [place] Patient taking differently: Take 0.5-1 mg by mouth See admin instructions. Take 0.5 mg by mouth daily on Monday, Wednesday and Friday. Take 1 mg by mouth daily on all other days. 01/24/18  Yes Belva Crome, MD  Naphazoline-Pheniramine (OPCON-A) 0.027-0.315 % SOLN Place 2-3 drops into both eyes daily as needed (for dry eyes).    [provider]     Family History  Problem Relation Age of Onset  . Heart attack Father   . Healthy  Mother   . Healthy Sister   . Healthy Sister     Social History   Socioeconomic History  . Marital status: Widowed    Spouse name: Not on file  . Number of children: Not on file  . Years of education: Not on file  . Highest education level: Not on file  Occupational History  . Not on file  Social Needs  . Financial resource strain: Not on file  . Food insecurity:    Worry: Not on file    Inability: Not on file  . Transportation needs:    Medical: Not on file    Non-medical: Not on file  Tobacco Use  . Smoking status: Former Research scientist (life sciences)  . Smokeless tobacco: Never Used  . Tobacco comment: QUIT IN 09/1982  Substance and Sexual Activity  . Alcohol use: Not Currently    Comment: NO ALCOHOL SINCE CHRISTMAS 2017  . Drug use: Never  . Sexual activity: Not on file  Lifestyle  . Physical activity:    Days per week: Not on file    Minutes per session: Not on file  . Stress: Not on file  Relationships  . Social connections:    Talks on phone: Not on file    Gets together: Not on file    Attends religious service: Not on file    Active member of club or organization: Not on file    Attends meetings of clubs or organizations: Not on file    Relationship status: Not on file  Other Topics Concern  . Not on file  Social History Narrative   Daughters Judson Roch and Sharyn Lull   Works for [employer], mainly at desk all day.    Performs all ADLs     Review of Systems: A 12 point ROS discussed and pertinent positives are indicated in the HPI above.  All other systems are negative.  Review of Systems  Vital Signs: BP 109/67   Pulse 85   Temp 97.6 F (36.4 C) (Oral)   Resp 18   Ht 5\' 11"  (1.803 m)   Wt 81.6 kg   SpO2 97%   BMI 25.10 kg/m   Physical Exam Vitals signs and nursing note reviewed.  Constitutional:      Appearance: Normal appearance. He is ill-appearing.  HENT:     Head: Normocephalic and atraumatic.  Eyes:     General: No scleral icterus. Cardiovascular:     Rate and  Rhythm: Normal rate.  Pulmonary:     Effort: No respiratory distress.     Comments: Normal effort at rest but becomes tachypneic when speaking.  Neurological:     Mental Status: He is alert.      Imaging: Dg Chest Left Decubitus  Result Date: 03/10/2018 CLINICAL DATA:  History of left lower lobe pneumonia, decubitus view EXAM: CHEST - LEFT DECUBITUS COMPARISON:  Films from earlier in the same day. FINDINGS: Left-side-down decubitus view shows large flowing pleural effusion. Postsurgical changes are again noted. IMPRESSION: Large flowing left pleural effusion. Electronically Signed   By: Inez Catalina M.D.   On: 03/10/2018 22:23   US Paracentesis  Result Date: 03/12/2018 INDICATION: Abdominal distention and discomfort. Ascites. Request for diagnostic and therapeutic paracentesis. EXAM: ULTRASOUND GUIDED RIGHT LOWER QUADRANT PARACENTESIS MEDICATIONS: None. COMPLICATIONS: None immediate. PROCEDURE: Informed written consent was obtained from the patient after a discussion of the risks, benefits and alternatives to treatment. A timeout was performed prior to the initiation of the procedure. Initial ultrasound scanning demonstrates a large amount of ascites within the right lower abdominal quadrant. The right lower abdomen was prepped and draped in the usual sterile fashion. 1% lidocaine was used for local anesthesia. Following this, a 19 gauge, 7-cm, Yueh catheter was introduced. An ultrasound image was saved for documentation purposes. The paracentesis was performed. The catheter was removed and a dressing was applied. The patient tolerated the procedure well without immediate post procedural complication. FINDINGS: A total of approximately 9 L of chylous yellow fluid was removed. Samples were sent to the laboratory as requested by the clinical team. IMPRESSION: Successful ultrasound-guided paracentesis yielding 9 liters of peritoneal fluid. Read by: Ascencion Dike PA-C Electronically Signed   By: Markus Daft M.D.   On: 03/12/2018 12:00   Dg Abdomen Acute W/chest  Result Date: 03/10/2018 CLINICAL DATA:  Shortness of breath, pain and swelling in abdomen, swelling in feet and lower legs, increased weakness, dry heaving, anorexia, food stuck in throat, history essential hypertension, no coronary artery disease post bypass, atrial fibrillation EXAM: DG ABDOMEN ACUTE W/ 1V CHEST COMPARISON:  Chest radiograph 01/03/2018, CT abdomen and pelvis 01/05/2018 FINDINGS: Upper normal size of cardiac silhouette post CABG and MVR. Atherosclerotic calcification aorta. LEFT pleural effusion and bibasilar atelectasis. Cannot exclude consolidation in LEFT lower lobe. Upper lungs clear. No pneumothorax. Nonobstructive bowel gas pattern. Increased attenuation of abdomen with medial displacement of bowel consistent with ascites. No bowel wall thickening, evidence of obstruction, or free air. Bones demineralized. IMPRESSION: Ascites. LEFT pleural effusion and bibasilar atelectasis. Cannot exclude coexistent consolidation in LEFT lower lobe. Electronically Signed   By: Lavonia Dana M.D.   On: 03/10/2018 15:12   Ir Radiologist Eval & Mgmt  Result Date: 03/29/2018 Please refer to notes tab for details about interventional procedure. (Op Note)   Labs:  CBC: Recent Labs    03/19/18 1423 03/20/18 0419 03/21/18 0533 03/22/18 0448  WBC 3.9* 4.4 4.5 5.7  HGB 8.3* 8.2* 8.5* 8.4*  HCT 25.8* 24.5* 26.2* 25.6*  PLT 199 221 260 291    COAGS: Recent Labs    03/20/18 0419 03/21/18 0533 03/22/18 0448 03/24/18 1408  INR 1.85 2.20 2.57 2.6    BMP: Recent Labs    03/19/18 0443 03/20/18 0419 03/21/18 0533 03/22/18 0448  NA 126* 126* 124* 124*  K 4.6 4.8 4.9 5.1  CL 97* 98 96* 97*  CO2 22 22 21* 22  GLUCOSE 122* 123* 117* 133*  BUN 70* 68* 69* 68*  CALCIUM 7.8* 8.0* 7.8* 8.0*  CREATININE 2.43* 2.60* 2.43* 2.37*  GFRNONAA 25* 23* 25* 26*  GFRAA 29* 27* 29* 30*    LIVER FUNCTION TESTS: Recent Labs     02/02/18 1318 03/10/18 1408 03/11/18 0607 03/11/18 1621 03/12/18 0346 03/18/18 1000  BILITOT 0.5 0.9 0.5  --  0.5  --   AST 71* 35 22  --  26  --  ALT 56* 29 21  --  22  --   ALKPHOS 102 61 45  --  54  --   PROT 5.6* 5.4* 4.2* 4.8* 4.7*  --   ALBUMIN 2.4* 2.2* 1.8* 2.1* 2.1* 2.1*    TUMOR MARKERS: No results for input(s): AFPTM, CEA, CA199, CHROMGRNA in the last 8760 hours.  Assessment and Plan:  Mario Powell has been suffering with chylous ascites and a left sided chylous effusion of uncertain etiology for the past several months.   A host of potential etiologies have been proposed including lymphatic obstruction related to his prior pelvic radiation for prostate cancer, malignant obstruction of lymphatic outflow given his history of prior gastric neuroendocrine cancer, cirrhosis (evaluated and ruled out), right-sided heart failure (evaluated and ruled out) and hypoalbuminemia.    His most recent large volume paracentesis was performed on 03/12/2018 at which time 9 L of fluid was removed.  He has not required paracentesis since that time and he does not feel overly full of fluid currently.  I performed bedside ultrasonography and he does have moderate ascites, likely on the order of 3 or 4 L.  Additionally, I have evaluated both his right and left thoracic cavity.  No significant pleural effusion on the right, he does have a significant pleural effusion on the left.  While his primary complaint is fatigue, he reports that he has been slowly proving since his discharge from the hospital.  He is continuing to maintain his nutrition and is slowly feeling better day by day.  I noted to him that he appears short of breath to me as he often has to pause between sentences to catch his breath.  He was not aware of this but upon thought, he does feel that he is somewhat short of breath.  We discussed the lymphangiogram procedure and its diagnostic and therapeutic capabilities.  It is an invasive, and  long procedure often requiring general anesthesia.  The procedure is somewhat effective at diagnosing the underlying cause of chylous ascites but is not a guarantee.  At a diagnosis.  Similarly, there is literature suggesting that the mildly sclerotic nature of the lipiodol can be therapeutic in the instance of a traumatic chylous leak.  The rate of therapeutic effect is approximately 50% for chylous pleural effusions.  However, in this gentleman's case with spontaneous chylous ascites, it is unclear if there would be any therapeutic benefit.  Given the uncertainty of benefit both with diagnosis and therapeutic effect and the relatively invasive nature of the procedure, I am not certain that it is worth pursuing in light of the fact that he is demonstrating some significant clinical improvement.  I think that his shortness of breath related to the relatively large remaining left pleural effusion is symptomatic.  Therefore, I will schedule him for a left-sided thoracentesis to be performed tomorrow.  He does not need to come off his Coumadin for this procedure.  Removing this fluid and allowing for reexpansion of his left lung may improve his shortness of breath and his fatigue which would improve his overall quality of life.  I will then plan on seeing him back in clinic in 1 month at which time I will reassess both his ascites, and evaluate for recurrence of the left pleural effusion.  If he is demonstrating continued clinical improvement we may not need to do anything.  However, if he has developed more significant ascites and/or reaccumulation of the left pleural effusion we may need to proceed with  lymph angiogram, or consider placement of a [place] (peritoneal-venous) shunt.  Given his underlying kidney condition, we may bypass lymphangiography altogether and proceed with a peritoneal venous shunt if his ascites does not resolve.  1.) Left Thoracentesis  2.) Return clinic visit in 1 month  Thank you for  this interesting consult.  I greatly enjoyed meeting Mario Powell and look forward to participating in their care.  A copy of this report was sent to the requesting provider on this date.  Electronically Signed: Jacqulynn Cadet 03/29/2018, 4:42 PM   I spent a total of  80 Minutes  in face to face in clinical consultation, greater than 50% of which was counseling/coordinating care for chylous ascites and chylous pleural effusion.

## 2018-03-30 ENCOUNTER — Encounter (HOSPITAL_COMMUNITY): Payer: Self-pay | Admitting: Radiology

## 2018-03-30 ENCOUNTER — Encounter: Payer: Self-pay | Admitting: Family Medicine

## 2018-03-30 ENCOUNTER — Other Ambulatory Visit: Payer: Self-pay | Admitting: Interventional Radiology

## 2018-03-30 ENCOUNTER — Ambulatory Visit (HOSPITAL_COMMUNITY)
Admission: RE | Admit: 2018-03-30 | Discharge: 2018-03-30 | Disposition: A | Discharge status: Home / Self Care | Payer: Medicare Other | Source: Ambulatory Visit | Attending: Radiology | Admitting: Radiology

## 2018-03-30 ENCOUNTER — Telehealth: Payer: Self-pay | Admitting: Family Medicine

## 2018-03-30 ENCOUNTER — Ambulatory Visit (INDEPENDENT_AMBULATORY_CARE_PROVIDER_SITE_OTHER): Payer: Medicare Other | Admitting: Pharmacist

## 2018-03-30 ENCOUNTER — Ambulatory Visit (HOSPITAL_COMMUNITY)
Admission: RE | Admit: 2018-03-30 | Discharge: 2018-03-30 | Disposition: A | Discharge status: Home / Self Care | Payer: Medicare Other | Source: Ambulatory Visit | Attending: Interventional Radiology | Admitting: Interventional Radiology

## 2018-03-30 DIAGNOSIS — R188 Other ascites: Secondary | ICD-10-CM

## 2018-03-30 DIAGNOSIS — Z952 Presence of prosthetic heart valve: Secondary | ICD-10-CM

## 2018-03-30 DIAGNOSIS — J90 Pleural effusion, not elsewhere classified: Secondary | ICD-10-CM | POA: Insufficient documentation

## 2018-03-30 DIAGNOSIS — Z5181 Encounter for therapeutic drug level monitoring: Secondary | ICD-10-CM

## 2018-03-30 HISTORY — PX: IR THORACENTESIS ASP PLEURAL SPACE W/IMG GUIDE: IMG5380

## 2018-03-30 LAB — POCT INR: INR: 3 (ref 2.0–3.0)

## 2018-03-30 MED ORDER — LIDOCAINE HCL 1 % IJ SOLN
INTRAMUSCULAR | Status: AC
  Filled 2018-03-30: qty 20

## 2018-03-30 MED ORDER — LIDOCAINE HCL (PF) 1 % IJ SOLN
INTRAMUSCULAR | Status: DC | PRN
  Administered 2018-03-30: 10 mL

## 2018-03-30 NOTE — Procedures (Addendum)
   US guided Left thoracentesis  1.7 L milky fluid  Tolerated well  Cxr: 30-40% ex vacuo PTX per Dr Kathlene Cote  Pt is asymptomatic Denies pain Breathing so much easier actually  02Sat: 97% RA  Will call pt in am Come to ED if symptoms of SOB occur Pt and sister have good understanding of plan

## 2018-03-30 NOTE — Telephone Encounter (Signed)
Clinical info completed on Work Disablitiy form.  Place form in Dr. Julianne Rice box for completion.  Katharina Caper, Khaleel Beckom D, [place]

## 2018-03-30 NOTE — Telephone Encounter (Signed)
Short term disability form dropped off for at front desk for completion.  Verified that patient section of form has been completed.  Last DOS/WCC with PCP was 02/02/18.  Placed form in team folder to be completed by clinical staff.  Crista Luria

## 2018-03-30 NOTE — Patient Instructions (Signed)
Continue taking 1 tablet daily except 1/2 tablet on Mondays and Wednesdays. Recheck INR in 1 week. Call when procedure is scheduled [phone]

## 2018-03-31 ENCOUNTER — Telehealth: Payer: Self-pay | Admitting: Radiology

## 2018-03-31 ENCOUNTER — Encounter: Payer: Self-pay | Admitting: Family Medicine

## 2018-03-31 NOTE — Progress Notes (Signed)
   Pt underwent Left thoracentesis 1/15  1.7 L milky fluid (chylous)  Tolerated well Breathing much easier and deeper after procedure  Post procedure CXR revealed ~40 % exvacuo  PTX per Dr Kathlene Cote  Pt was asymptomatic-- actually felt so much better Dicussed case with Dr Kathlene Cote Was sent on home with sister   I have spoken to pt this am He slept well Still no pain Much less SOB Taking deeper breaths  Discussed case again with Dr Kathlene Cote No need for CXR today since asymptomatic and actually better Pt is aware if he develops chest pain or increased SOB- He would need to present to ED  Pt is to see Dr Shellia Cleverly in a few weeks in [facility] I will also touch base with him regarding this pt

## 2018-04-01 ENCOUNTER — Other Ambulatory Visit: Payer: Self-pay | Admitting: Radiology

## 2018-04-01 DIAGNOSIS — J90 Pleural effusion, not elsewhere classified: Secondary | ICD-10-CM

## 2018-04-03 ENCOUNTER — Encounter: Payer: Self-pay | Admitting: Family Medicine

## 2018-04-05 ENCOUNTER — Other Ambulatory Visit: Payer: Self-pay | Admitting: Family Medicine

## 2018-04-05 ENCOUNTER — Telehealth: Payer: Self-pay

## 2018-04-05 ENCOUNTER — Encounter: Payer: Self-pay | Admitting: Family Medicine

## 2018-04-05 ENCOUNTER — Encounter (HOSPITAL_COMMUNITY): Payer: Self-pay | Admitting: Family Medicine

## 2018-04-05 DIAGNOSIS — I898 Other specified noninfective disorders of lymphatic vessels and lymph nodes: Secondary | ICD-10-CM

## 2018-04-05 MED ORDER — ALBUMIN HUMAN 25 % IV SOLN: 50.0000 g | INTRAVENOUS | Status: AC | PRN

## 2018-04-05 NOTE — Telephone Encounter (Signed)
Scheduled appoint for IR paracentesis 04/07/2018 @1400  with an arrival time of 1345 @ [facility].  Attempted to call patient at listed number but voice mail was full.  Called and informed daughter Mario Powell @ [phone].  [name] states that this is not a good date and time for her so I gave her the number to scheduling at [facility] [phone] to choose a more convenient time for them.  [name], [place]

## 2018-04-05 NOTE — Telephone Encounter (Signed)
Patient daughter, [name], calling about paracentesis needing to be scheduled.  Call back is [phone]  Danley Danker, RN [facility] RN)

## 2018-04-05 NOTE — Progress Notes (Signed)
See other note from today on pt scheduling. Ottis Stain, CMA

## 2018-04-05 NOTE — Telephone Encounter (Signed)
Reviewed, completed, and signed form.  Note routed to RN team inbasket and placed completed form in Clinic RN's office (wall pocket above desk). Will be placed prior to opening of clinic today as I am on night float this week.   Wilber Oliphant, MD

## 2018-04-05 NOTE — Telephone Encounter (Signed)
Called pt and informed him of his arrival time of 145pm at [facility] cone for apt. No special instructions for patient and he can take coumadin as planned. Pt verbalized understanding.

## 2018-04-05 NOTE — Progress Notes (Signed)
Patient's daughter left a message on my chart requesting outpatient paracentesis. Plans to schedule patient for outpatient paracentesis at [facility], IR this week. Will have to reach out to IR about albumin infusion as well as orders as patient will be ambulatory status.  03/29/18 - seen at [facility] IR  03/30/18 - Left thoracentesis 1.7 milky fluid  03/12/18 - US guided paracentesis - 9 L chylous yellow fluid  02/04/18 - IR [place] Chylous Ascites, yielded 5L chylous ascited from right lateral abdomen   Patient's most recent INR 3.0 on 03/30/18.   --------- Discussed with Dr. Maudie Mercury.  Ordered IR paracentesis. If <5 L of ascites, replace with 25 g of albumin, if more than 5 L, will give 50 g total.  Albumin and paracentesis order placed.  Nursing- Please call radiology scheduling at [phone] to schedule patient for IR paracentesis this week. Please call patient and let him know time/date. Okay to continue warfarin given recent INR within range.  Let me know if you have questions.

## 2018-04-05 NOTE — Telephone Encounter (Signed)
Arranged by Dr. Owens Shark. Danley Danker, RN [facility] RN)

## 2018-04-05 NOTE — Addendum Note (Signed)
Addended by: Owens Shark, Kingsly Kloepfer on: 04/05/2018 02:38 PM   Modules accepted: Orders

## 2018-04-05 NOTE — Telephone Encounter (Signed)
LM informing daughter that form was ready for pick up.   Copy placed in batch scanning. Fleeger, Salome Spotted, CMA

## 2018-04-07 ENCOUNTER — Ambulatory Visit (HOSPITAL_COMMUNITY)
Admission: RE | Admit: 2018-04-07 | Discharge: 2018-04-07 | Disposition: A | Discharge status: Home / Self Care | Payer: Medicare Other | Source: Ambulatory Visit | Attending: Family Medicine | Admitting: Family Medicine

## 2018-04-07 ENCOUNTER — Ambulatory Visit (HOSPITAL_COMMUNITY): Payer: Medicare Other

## 2018-04-07 ENCOUNTER — Ambulatory Visit (INDEPENDENT_AMBULATORY_CARE_PROVIDER_SITE_OTHER): Payer: Medicare Other | Admitting: Pharmacist

## 2018-04-07 ENCOUNTER — Encounter (HOSPITAL_COMMUNITY): Payer: Self-pay | Admitting: Interventional Radiology

## 2018-04-07 DIAGNOSIS — R188 Other ascites: Secondary | ICD-10-CM | POA: Insufficient documentation

## 2018-04-07 DIAGNOSIS — Z952 Presence of prosthetic heart valve: Secondary | ICD-10-CM | POA: Diagnosis not present

## 2018-04-07 DIAGNOSIS — I898 Other specified noninfective disorders of lymphatic vessels and lymph nodes: Secondary | ICD-10-CM | POA: Diagnosis present

## 2018-04-07 HISTORY — PX: IR PARACENTESIS: IMG2679

## 2018-04-07 LAB — POCT INR: INR: 2.8 (ref 2.0–3.0)

## 2018-04-07 MED ORDER — ALBUMIN HUMAN 25 % IV SOLN: 50.0000 g | Freq: Once | INTRAVENOUS | Status: DC

## 2018-04-07 MED ORDER — LIDOCAINE HCL 1 % IJ SOLN
INTRAMUSCULAR | Status: DC | PRN
  Administered 2018-04-07: 10 mL

## 2018-04-07 MED ORDER — ALBUMIN HUMAN 25 % IV SOLN
INTRAVENOUS | Status: AC
  Filled 2018-04-07: qty 200

## 2018-04-07 MED ORDER — ALBUMIN HUMAN 25 % IV SOLN: 25.0000 g | Freq: Once | INTRAVENOUS | Status: DC

## 2018-04-07 MED ORDER — LIDOCAINE HCL 1 % IJ SOLN
INTRAMUSCULAR | Status: AC
  Filled 2018-04-07: qty 20

## 2018-04-07 NOTE — Procedures (Signed)
PROCEDURE SUMMARY:  Successful image-guided paracentesis from the right lower abdomen.  Yielded 8.6 liters of yellow chylous fluid.  No immediate complications.  EBL: zero Patient tolerated well.   Specimen was not sent for labs.  Please see imaging section of Epic for full dictation.  Joaquim Nam PA-C 04/07/2018 1:22 PM

## 2018-04-07 NOTE — Patient Instructions (Signed)
Description   Continue taking 1 tablet daily except 1/2 tablet on Mondays and Wednesdays. Recheck INR in 4 week. Call when procedure is scheduled [phone]

## 2018-04-15 ENCOUNTER — Encounter: Payer: Self-pay | Admitting: Family Medicine

## 2018-04-15 ENCOUNTER — Ambulatory Visit (INDEPENDENT_AMBULATORY_CARE_PROVIDER_SITE_OTHER): Payer: Medicare Other | Admitting: Family Medicine

## 2018-04-15 ENCOUNTER — Other Ambulatory Visit: Payer: Self-pay

## 2018-04-15 VITALS — BP 96/62 | HR 113 | Temp 98.5°F | Ht 70.0 in | Wt 192.4 lb

## 2018-04-15 DIAGNOSIS — D3A8 Other benign neuroendocrine tumors: Secondary | ICD-10-CM

## 2018-04-15 DIAGNOSIS — N184 Chronic kidney disease, stage 4 (severe): Secondary | ICD-10-CM

## 2018-04-15 DIAGNOSIS — J90 Pleural effusion, not elsewhere classified: Secondary | ICD-10-CM

## 2018-04-15 DIAGNOSIS — I898 Other specified noninfective disorders of lymphatic vessels and lymph nodes: Secondary | ICD-10-CM

## 2018-04-15 DIAGNOSIS — D472 Monoclonal gammopathy: Secondary | ICD-10-CM

## 2018-04-15 DIAGNOSIS — C7A8 Other malignant neuroendocrine tumors: Secondary | ICD-10-CM | POA: Diagnosis not present

## 2018-04-15 NOTE — Patient Instructions (Signed)
Dear Berneice Heinrich Family,   Our working To Do List   1. Possible paracentesis on 2/3 at [facility] if Dr. Maudie Mercury can schedule it over the weekend  2. Go to [facility] on Tuesday. I will be in touch with them about future plans  3. Keep your appointment with Dr. Laurence Ferrari just in case you need to have a thoracentesis for fluid on your lung.  4. I will get in touch with [facility] about possibly restarting fluid pills since your fluid is building up more quickly. Also, will get you an appointment to see them.    Best,   Dr. Zettie Cooley Resident Physician, PGY-1 [facility] [phone]    Don't forget to sign up for MyChart for instant access to your health profile, labs, orders, upcoming appointments or to contact your provider with questions. Stop at the front desk on the way out for more information about how to sign up!

## 2018-04-15 NOTE — Progress Notes (Signed)
   SUBJECTIVE:  PCP: Wilber Oliphant, MD Patient ID: MRN [id number]  Date of birth: [birth year]  CC: Chylous ascites follow up   Patient reports that his ascites has continued to build and is requesting paracentesis.  His last paracentesis was on the 23rd.  He was discontinued on his diuretics after his previous hospitalization due to his acute kidney injury.  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  His appointment is scheduled for February 4, and patient is agreeable to go to this appointment.  In the meantime, patient would like to schedule a paracentesis for his abdominal ascites.  He is going to be seen by Dr. Laurence Ferrari next week on 2/6 for follow-up and possible thoracentesis. Patient reports that his strength has been increasing since his discharge from the hospital.   Review of Symptoms: See HPI  HISTORY Medications & Allergies: Reviewed with patient and updated in EMR as appropriate.   FHx:  family history includes Healthy in his mother, sister, and sister; Heart attack in his father.  SHx  reports that he has quit smoking. He has never used smokeless tobacco. He reports previous alcohol use. He reports that he does not use drugs.  OBJECTIVE:  BP 96/62   Pulse (!) 113   Temp 98.5 F (36.9 C) (Axillary)   Ht 5\' 10"  (1.778 m)   Wt 192 lb 6.4 oz (87.3 kg)   SpO2 91%   BMI 27.61 kg/m   Physical Exam:  Gen: NAD, alert, cachectic appearing, tired.  Skin: Warm and dry. No obvious rashes, lesions, or trauma. No jaundice. Pallor. Small ulcer on left buttock- see below  HEENT: NCAT No conjunctival pallor or injection. No scleral icterus or injection.  MMM.  Resp: No breath sounds on left lower base. No crackles appreciated  Abd: distended with 1+ edema  Psych: Cooperative with exam. Pleasant. Makes eye contact. Speech normal. Extremities: Moves very slowly, dyspneic .   Neuro: CN II-XII grossly intact. No FNDs.    Pertinent Labs &  Imaging:  Reviewed in chart   ASSESSMENT & PLAN:  Pleural effusion, left Patient will be seen by Dr. Laurence Ferrari on 2/6.  Today, he has decreased lower left base lung sounds, however no crackles appreciated.  Possible thoracentesis with Dr. Laurence Ferrari at appointment.  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  It appears to be building faster now that he is not on any diuretics which were discontinued after his last hospitalization. Has not had follow up with nephrology. Will set up follow up.   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  Zettie Cooley, M.D. [place]  PGY -1 05/12/2018, 5:53 PM

## 2018-04-18 ENCOUNTER — Other Ambulatory Visit: Payer: Self-pay

## 2018-04-18 ENCOUNTER — Encounter: Payer: Self-pay | Admitting: Family Medicine

## 2018-04-18 ENCOUNTER — Inpatient Hospital Stay (HOSPITAL_COMMUNITY)
Admission: EM | Admit: 2018-04-18 | Discharge: 2018-05-15 | DRG: 682 | Disposition: E | Payer: Medicare Other | Attending: Pulmonary Disease | Admitting: Pulmonary Disease

## 2018-04-18 ENCOUNTER — Telehealth: Payer: Self-pay | Admitting: Family Medicine

## 2018-04-18 ENCOUNTER — Encounter (HOSPITAL_COMMUNITY): Payer: Self-pay | Admitting: *Deleted

## 2018-04-18 ENCOUNTER — Telehealth: Payer: Self-pay

## 2018-04-18 ENCOUNTER — Emergency Department (HOSPITAL_COMMUNITY): Payer: Medicare Other

## 2018-04-18 DIAGNOSIS — I129 Hypertensive chronic kidney disease with stage 1 through stage 4 chronic kidney disease, or unspecified chronic kidney disease: Secondary | ICD-10-CM | POA: Diagnosis present

## 2018-04-18 DIAGNOSIS — D689 Coagulation defect, unspecified: Secondary | ICD-10-CM | POA: Diagnosis present

## 2018-04-18 DIAGNOSIS — Z7901 Long term (current) use of anticoagulants: Secondary | ICD-10-CM

## 2018-04-18 DIAGNOSIS — Z515 Encounter for palliative care: Secondary | ICD-10-CM | POA: Diagnosis not present

## 2018-04-18 DIAGNOSIS — Z992 Dependence on renal dialysis: Secondary | ICD-10-CM

## 2018-04-18 DIAGNOSIS — J9600 Acute respiratory failure, unspecified whether with hypoxia or hypercapnia: Secondary | ICD-10-CM

## 2018-04-18 DIAGNOSIS — I480 Paroxysmal atrial fibrillation: Secondary | ICD-10-CM | POA: Diagnosis present

## 2018-04-18 DIAGNOSIS — Z87891 Personal history of nicotine dependence: Secondary | ICD-10-CM

## 2018-04-18 DIAGNOSIS — I898 Other specified noninfective disorders of lymphatic vessels and lymph nodes: Secondary | ICD-10-CM | POA: Diagnosis present

## 2018-04-18 DIAGNOSIS — G9341 Metabolic encephalopathy: Secondary | ICD-10-CM | POA: Diagnosis present

## 2018-04-18 DIAGNOSIS — L89153 Pressure ulcer of sacral region, stage 3: Secondary | ICD-10-CM | POA: Diagnosis present

## 2018-04-18 DIAGNOSIS — Z974 Presence of external hearing-aid: Secondary | ICD-10-CM

## 2018-04-18 DIAGNOSIS — E871 Hypo-osmolality and hyponatremia: Secondary | ICD-10-CM | POA: Diagnosis present

## 2018-04-18 DIAGNOSIS — Z951 Presence of aortocoronary bypass graft: Secondary | ICD-10-CM

## 2018-04-18 DIAGNOSIS — H9190 Unspecified hearing loss, unspecified ear: Secondary | ICD-10-CM | POA: Diagnosis present

## 2018-04-18 DIAGNOSIS — E875 Hyperkalemia: Secondary | ICD-10-CM | POA: Diagnosis present

## 2018-04-18 DIAGNOSIS — N184 Chronic kidney disease, stage 4 (severe): Secondary | ICD-10-CM | POA: Diagnosis present

## 2018-04-18 DIAGNOSIS — D500 Iron deficiency anemia secondary to blood loss (chronic): Secondary | ICD-10-CM | POA: Diagnosis present

## 2018-04-18 DIAGNOSIS — N189 Chronic kidney disease, unspecified: Secondary | ICD-10-CM | POA: Diagnosis not present

## 2018-04-18 DIAGNOSIS — Z880 Allergy status to penicillin: Secondary | ICD-10-CM

## 2018-04-18 DIAGNOSIS — J90 Pleural effusion, not elsewhere classified: Secondary | ICD-10-CM | POA: Diagnosis present

## 2018-04-18 DIAGNOSIS — I2510 Atherosclerotic heart disease of native coronary artery without angina pectoris: Secondary | ICD-10-CM | POA: Diagnosis present

## 2018-04-18 DIAGNOSIS — R791 Abnormal coagulation profile: Secondary | ICD-10-CM

## 2018-04-18 DIAGNOSIS — Z952 Presence of prosthetic heart valve: Secondary | ICD-10-CM

## 2018-04-18 DIAGNOSIS — Z86010 Personal history of colonic polyps: Secondary | ICD-10-CM

## 2018-04-18 DIAGNOSIS — R0602 Shortness of breath: Secondary | ICD-10-CM | POA: Diagnosis not present

## 2018-04-18 DIAGNOSIS — J9811 Atelectasis: Secondary | ICD-10-CM | POA: Diagnosis present

## 2018-04-18 DIAGNOSIS — Z66 Do not resuscitate: Secondary | ICD-10-CM | POA: Diagnosis not present

## 2018-04-18 DIAGNOSIS — N179 Acute kidney failure, unspecified: Principal | ICD-10-CM

## 2018-04-18 DIAGNOSIS — R0600 Dyspnea, unspecified: Secondary | ICD-10-CM | POA: Diagnosis present

## 2018-04-18 DIAGNOSIS — E874 Mixed disorder of acid-base balance: Secondary | ICD-10-CM | POA: Diagnosis present

## 2018-04-18 DIAGNOSIS — E43 Unspecified severe protein-calorie malnutrition: Secondary | ICD-10-CM | POA: Diagnosis present

## 2018-04-18 DIAGNOSIS — Z973 Presence of spectacles and contact lenses: Secondary | ICD-10-CM

## 2018-04-18 DIAGNOSIS — K811 Chronic cholecystitis: Secondary | ICD-10-CM | POA: Diagnosis present

## 2018-04-18 DIAGNOSIS — I25810 Atherosclerosis of coronary artery bypass graft(s) without angina pectoris: Secondary | ICD-10-CM | POA: Diagnosis present

## 2018-04-18 DIAGNOSIS — Z8546 Personal history of malignant neoplasm of prostate: Secondary | ICD-10-CM

## 2018-04-18 DIAGNOSIS — Z79899 Other long term (current) drug therapy: Secondary | ICD-10-CM

## 2018-04-18 DIAGNOSIS — W19XXXA Unspecified fall, initial encounter: Secondary | ICD-10-CM

## 2018-04-18 DIAGNOSIS — D3A8 Other benign neuroendocrine tumors: Secondary | ICD-10-CM | POA: Diagnosis present

## 2018-04-18 DIAGNOSIS — L8990 Pressure ulcer of unspecified site, unspecified stage: Secondary | ICD-10-CM | POA: Diagnosis present

## 2018-04-18 DIAGNOSIS — R571 Hypovolemic shock: Secondary | ICD-10-CM | POA: Diagnosis present

## 2018-04-18 DIAGNOSIS — Z923 Personal history of irradiation: Secondary | ICD-10-CM

## 2018-04-18 DIAGNOSIS — E538 Deficiency of other specified B group vitamins: Secondary | ICD-10-CM | POA: Diagnosis present

## 2018-04-18 DIAGNOSIS — F419 Anxiety disorder, unspecified: Secondary | ICD-10-CM | POA: Diagnosis present

## 2018-04-18 DIAGNOSIS — K429 Umbilical hernia without obstruction or gangrene: Secondary | ICD-10-CM | POA: Diagnosis present

## 2018-04-18 DIAGNOSIS — R64 Cachexia: Secondary | ICD-10-CM | POA: Diagnosis present

## 2018-04-18 DIAGNOSIS — I890 Lymphedema, not elsewhere classified: Secondary | ICD-10-CM | POA: Diagnosis present

## 2018-04-18 DIAGNOSIS — Z9181 History of falling: Secondary | ICD-10-CM

## 2018-04-18 DIAGNOSIS — T45515A Adverse effect of anticoagulants, initial encounter: Secondary | ICD-10-CM | POA: Diagnosis present

## 2018-04-18 DIAGNOSIS — Z8249 Family history of ischemic heart disease and other diseases of the circulatory system: Secondary | ICD-10-CM

## 2018-04-18 DIAGNOSIS — J9620 Acute and chronic respiratory failure, unspecified whether with hypoxia or hypercapnia: Secondary | ICD-10-CM | POA: Diagnosis present

## 2018-04-18 DIAGNOSIS — E785 Hyperlipidemia, unspecified: Secondary | ICD-10-CM | POA: Diagnosis present

## 2018-04-18 DIAGNOSIS — I9589 Other hypotension: Secondary | ICD-10-CM | POA: Diagnosis present

## 2018-04-18 DIAGNOSIS — R627 Adult failure to thrive: Secondary | ICD-10-CM | POA: Diagnosis present

## 2018-04-18 DIAGNOSIS — R0603 Acute respiratory distress: Secondary | ICD-10-CM | POA: Diagnosis not present

## 2018-04-18 DIAGNOSIS — R188 Other ascites: Secondary | ICD-10-CM | POA: Diagnosis not present

## 2018-04-18 DIAGNOSIS — Z6824 Body mass index (BMI) 24.0-24.9, adult: Secondary | ICD-10-CM

## 2018-04-18 LAB — CBC WITH DIFFERENTIAL/PLATELET
Abs Immature Granulocytes: 0.12 10*3/uL — ABNORMAL HIGH (ref 0.00–0.07)
BASOS PCT: 0 %
Basophils Absolute: 0 10*3/uL (ref 0.0–0.1)
EOS ABS: 0 10*3/uL (ref 0.0–0.5)
Eosinophils Relative: 0 %
HCT: 31.7 % — ABNORMAL LOW (ref 39.0–52.0)
Hemoglobin: 10.2 g/dL — ABNORMAL LOW (ref 13.0–17.0)
Immature Granulocytes: 1 %
Lymphocytes Relative: 1 %
Lymphs Abs: 0.1 10*3/uL — ABNORMAL LOW (ref 0.7–4.0)
MCH: 28.6 pg (ref 26.0–34.0)
MCHC: 32.2 g/dL (ref 30.0–36.0)
MCV: 88.8 fL (ref 80.0–100.0)
Monocytes Absolute: 1 10*3/uL (ref 0.1–1.0)
Monocytes Relative: 7 %
Neutro Abs: 13.7 10*3/uL — ABNORMAL HIGH (ref 1.7–7.7)
Neutrophils Relative %: 91 %
PLATELETS: 486 10*3/uL — AB (ref 150–400)
RBC: 3.57 MIL/uL — ABNORMAL LOW (ref 4.22–5.81)
RDW: 16.1 % — ABNORMAL HIGH (ref 11.5–15.5)
WBC: 15 10*3/uL — ABNORMAL HIGH (ref 4.0–10.5)
nRBC: 0 % (ref 0.0–0.2)

## 2018-04-18 LAB — COMPREHENSIVE METABOLIC PANEL
ALT: 88 U/L — ABNORMAL HIGH (ref 0–44)
AST: 99 U/L — ABNORMAL HIGH (ref 15–41)
Albumin: 1.7 g/dL — ABNORMAL LOW (ref 3.5–5.0)
Alkaline Phosphatase: 289 U/L — ABNORMAL HIGH (ref 38–126)
Anion gap: 13 (ref 5–15)
BILIRUBIN TOTAL: 0.9 mg/dL (ref 0.3–1.2)
BUN: 80 mg/dL — ABNORMAL HIGH (ref 8–23)
CALCIUM: 8.1 mg/dL — AB (ref 8.9–10.3)
CO2: 17 mmol/L — ABNORMAL LOW (ref 22–32)
Chloride: 89 mmol/L — ABNORMAL LOW (ref 98–111)
Creatinine, Ser: 4.43 mg/dL — ABNORMAL HIGH (ref 0.61–1.24)
GFR calc Af Amer: 14 mL/min — ABNORMAL LOW (ref >60)
GFR calc non Af Amer: 12 mL/min — ABNORMAL LOW (ref >60)
Glucose, Bld: 106 mg/dL — ABNORMAL HIGH (ref 70–99)
Potassium: 6.9 mmol/L (ref 3.5–5.1)
Sodium: 119 mmol/L — CL (ref 135–145)
Total Protein: 5.2 g/dL — ABNORMAL LOW (ref 6.5–8.1)

## 2018-04-18 LAB — PROTIME-INR
INR: 6.33
Prothrombin Time: 54.8 seconds — ABNORMAL HIGH (ref 11.4–15.2)

## 2018-04-18 MED ORDER — VICKS VAPORUB 4.73-1.2-2.6 % EX OINT: TOPICAL_OINTMENT | CUTANEOUS | Status: DC

## 2018-04-18 MED ORDER — ROSUVASTATIN CALCIUM 20 MG PO TABS
40.0000 mg | ORAL_TABLET | Freq: Every day | ORAL | Status: DC
  Administered 2018-04-19: 40 mg via ORAL
  Filled 2018-04-18: qty 2

## 2018-04-18 MED ORDER — CALCIUM CARBONATE-VITAMIN D 500-200 MG-UNIT PO TABS
1.0000 | ORAL_TABLET | Freq: Every day | ORAL | Status: DC
  Administered 2018-04-19: 1 via ORAL
  Filled 2018-04-18: qty 1

## 2018-04-18 MED ORDER — VITAMIN D 25 MCG (1000 UNIT) PO TABS
1000.0000 [IU] | ORAL_TABLET | Freq: Every day | ORAL | Status: DC
  Administered 2018-04-19: 1000 [IU] via ORAL
  Filled 2018-04-18: qty 1

## 2018-04-18 MED ORDER — ACETAMINOPHEN 650 MG RE SUPP: 650.0000 mg | Freq: Four times a day (QID) | RECTAL | Status: DC | PRN

## 2018-04-18 MED ORDER — MEDIUM CHAIN TRIGLYCERIDES PO OIL
15.0000 mL | TOPICAL_OIL | Freq: Every day | ORAL | Status: DC
  Filled 2018-04-18 (×2): qty 15

## 2018-04-18 MED ORDER — NAPHAZOLINE-PHENIRAMINE 0.025-0.3 % OP SOLN
2.0000 [drp] | Freq: Every day | OPHTHALMIC | Status: DC | PRN
  Filled 2018-04-18: qty 15

## 2018-04-18 MED ORDER — EZETIMIBE 10 MG PO TABS: 10.0000 mg | ORAL_TABLET | Freq: Every day | ORAL | Status: DC

## 2018-04-18 MED ORDER — ALBUTEROL SULFATE (2.5 MG/3ML) 0.083% IN NEBU
5.0000 mg | INHALATION_SOLUTION | Freq: Once | RESPIRATORY_TRACT | Status: AC
  Administered 2018-04-18: 5 mg via RESPIRATORY_TRACT
  Filled 2018-04-18: qty 6

## 2018-04-18 MED ORDER — SODIUM ZIRCONIUM CYCLOSILICATE 5 G PO PACK
5.0000 g | PACK | Freq: Once | ORAL | Status: AC
  Administered 2018-04-18: 5 g via ORAL
  Filled 2018-04-18: qty 1

## 2018-04-18 MED ORDER — ACETAMINOPHEN 325 MG PO TABS: 650.0000 mg | ORAL_TABLET | Freq: Four times a day (QID) | ORAL | Status: DC | PRN

## 2018-04-18 MED ORDER — SODIUM BICARBONATE 8.4 % IV SOLN
50.0000 meq | Freq: Once | INTRAVENOUS | Status: AC
  Administered 2018-04-19: 50 meq via INTRAVENOUS
  Filled 2018-04-18: qty 50

## 2018-04-18 MED ORDER — PANTOPRAZOLE SODIUM 40 MG PO TBEC
40.0000 mg | DELAYED_RELEASE_TABLET | Freq: Two times a day (BID) | ORAL | Status: DC
  Administered 2018-04-19 (×2): 40 mg via ORAL
  Filled 2018-04-18 (×2): qty 1

## 2018-04-18 MED ORDER — CALCIUM GLUCONATE-NACL 1-0.675 GM/50ML-% IV SOLN
1.0000 g | Freq: Once | INTRAVENOUS | Status: AC
  Administered 2018-04-19: 1000 mg via INTRAVENOUS
  Filled 2018-04-18: qty 50

## 2018-04-18 NOTE — Telephone Encounter (Signed)
Pt's daughter called and the pt is requesting for a fluid drawn and a paracentesis. Call pts daughter at [phone]. ad

## 2018-04-18 NOTE — Assessment & Plan Note (Addendum)
Patient continues to have chylous ascites buildup.  It appears to be building faster now that he is not on any diuretics which were discontinued after his last hospitalization. Has not had follow up with nephrology. Will set up follow up.  Will wait until patient sees nephrology at outpatient follow-up for possible restarting of diuretics.  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

## 2018-04-18 NOTE — ED Triage Notes (Signed)
PT fell on Sunday in his garage while trying to get in his car.

## 2018-04-18 NOTE — Telephone Encounter (Signed)
Patient daughter left message to check on possible paracentesis to be scheduled today.  Call back is [phone]  Danley Danker, RN [facility] RN)

## 2018-04-18 NOTE — Assessment & Plan Note (Signed)
Patient will be seen by Dr. Laurence Ferrari on 2/6.  Today, he has decreased lower left base lung sounds, however no crackles appreciated.  Possible thoracentesis with Dr. Laurence Ferrari at appointment.

## 2018-04-18 NOTE — Assessment & Plan Note (Signed)
To see [facility] for further evaluation this week

## 2018-04-18 NOTE — Assessment & Plan Note (Addendum)
Continues to follow with [facility]

## 2018-04-18 NOTE — H&P (Addendum)
[facility] Admission History and Physical Service Pager: [phone]  Patient name: Mario Powell Medical record number: [id number] Date of birth: Jun 08, 1945 Age: 73 y.o. Gender: male  Primary Care Provider: Wilber Oliphant, MD Consultants: IR Code Status: Full  Chief Complaint: fall, dyspnea  Assessment and Plan: Mario Powell is a 73 y.o. male presenting after a fall from his jeep today, which revealed no fractures or intracranial abnormalities, but was found to be dyspneic 2/2 ascites and hyperkalemic. 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  Dyspnea secondary to Ascites and pleural effusion - patient has chronic history of chylous ascites that is in the process of being worked up as an outpatient.  The patient has an appointment at [facility] tomorrow regarding this.  He receives regular therapeutic paracenteses for this, and these have been occurring with decreasing time in between procedures. He last had a paracentesis on 1/23. He has had hospitalizations in the recent past for dyspnea and difficulty swallowing due to his abundance of ascites.  Patient does not wear oxygen at home but was on 4L in the ED and was having difficulty speaking more than one word at a time. Daughter states his dyspnea has been worsening over the past week, most likely secondary to fluid overload from ascites. Patient otherwise without signs of infection, low suspicion for SBP. Would benefit greatly from therapeutic paracentesis. - admit to inpatient, med-surg.  Dr. Nori Riis attending.  - consult IR for therapeutic paracenteses.  - consider thoracentesis if dyspnea does not improve with paracentesis  - supplemental oxygen - pulse ox w/ vitals - ezetimibe 10mg   - vitals per routine.  - NPO  Hyperkalemia, Metabolic acidosis - patient has a potassium level of 6.9 today . Previous level was 5.1  on 1/7.  Patient is taking ezetimibe, which has the potential to increase potassium levels, although patient appears to have been taking this for several years. EKG did not show any T wave abnormalities.  Denies chest pain. Patient was given 5g lokelma and albuterol in the ED.  BMP was reordered but has not resulted yet. - calcium gluconate 1g - sodium bicarb 82mEq x 1.  - f/u repeat BMP  - AM CMP - AM mag level - AM EKG  Fall - patient fell from his jeep today onto the ground and was found by his daughter shortly afterwards. He did not lose consciousness.  There are no signs of bleeding or fractures.  No altered mental status.  Hand and foot xrays did not show abnormalities.  CT head and spine did not indicate bleed or fracture.   - continue to monitor mental status. Consider intracranial bleed if patient becomes altered, given his supratherapeutic INR.    Hx of mechanical mitral valve replacement on warfarin, supratherapeutic - patient's INR is 6.33 on admission.  Previous INR on 1/23 was 2.8.  Patient has been therapeutic throughout the month of January before coming in supratherapeutic today.  No signs of bleeding on exam. - hold warfarin - monitor INR - consider reversal if there are concerns for bleeding risk.    Neuroendocrine tumor - patient was diagnosed via EGD in September and followup EGD in December showed additional neuroendocrine tumor. Patient follows with [facility] and Dr. Annamaria Boots at heme/onc.   HTN - BP low normal on admission. Takes spironolactone and torsemide at home.   - hold home meds  Paroxysmal A-fib - in sinus  rhythm. On warfarin at home, currently supratherapeutic.   - warfarin dosing per pharm. Currently holding while supratherapeutic - daily INR  Hyponatremia - currently 119.  Patient is asymptomatic.  Patient normally ~125.  Patient is receiving sodium bicarb for hyperkalemia, which should increase Na.   - f/u morning CMP  Chronic normocytic anemia - patient has  Hgb of 10.2 on admission.  No signs of bleeding.   - AM CBC  AKI on CKD IV - patient has history of CKD.  Most recent Creatinine was 2.37 on 1/7 of this year.  It is currently 4.43. Current Creatinine Clearance is 15.51.  Could be prerenal from his diuretic use and third spacing or possibly a worsening of his CKD.  Will continue to monitor creatinine. - AM CMP - should establish with nephrology outpt  Protein Calorie Malnutrition - albumin is 1.7. patient appears less cachectic than when I encountered him during last admission in December.  This is a chronic issue for him.  - add ensure once taking PO  Vit B12 deficiency - patient receives monthly injections with Dr. Annamaria Boots - f/u B12 level  HLD -  Continue crestor 40mg  - continue home med  FEN/GI: sips with meds.  Prophylaxis: SCDs  Disposition: med-surg  History of Present Illness:  Mario Powell is a 73 y.o. male presenting after a fall from his car this afternoon.   Patient states he was getting in his car today and his legs went out from underneath him and he fell about 3 feet onto the ground onto his L side. The fall was unwitnessed by daughter. He did not hit his head. His daughter does not think he was down for very long as she was in the house when it happened. Daughter states he has seemed more out of breath and disoriented lately due to excess fluid. He used to last about 2 weeks before having to get a paracentesis but is now lasting 4-5 days. Last paracentesis was 04/07/18. At recent clinic visit 04/15/18 was noted he had gained 11lb after paracentesis. His daughter says the paracenteses are coming sooner and sooner.  He has not been very mobile since last admission, has been sitting around most of the day per patient. He does not require oxygen at home. Typically he is mobile with a walker but has been requiring more breaks. Previously refused home health. Has been having a sensation of nasal blockage making it difficult to breathe.  His daughter states he seemed disoriented last night. He denies difficulty swallowing.   Patient has an appointment tomorrow with [facility] regarding his chylo-ascites.   Review Of Systems: Per HPI with the following additions:   Review of Systems  Constitutional: Negative for fever and weight loss.  Respiratory: Negative for cough.   Cardiovascular: Positive for chest pain.  Gastrointestinal: Negative for blood in stool, constipation, diarrhea and vomiting.  Genitourinary: Negative for dysuria and hematuria.  Musculoskeletal: Positive for neck pain.  Neurological: Positive for headaches.   Patient Active Problem List   Diagnosis Date Noted  . Shortness of breath 04/20/2018  . Pressure injury of skin 03/21/2018  . Chronic respiratory failure ([place])   . Tracheostomy care ([place])   . Palliative care by specialist   . Acute blood loss anemia   . Coronary artery disease involving coronary bypass graft of native heart without angina pectoris   . PAF (paroxysmal atrial fibrillation) ([place])   . Benign essential HTN   . Chronic kidney disease (CKD), stage IV (  severe) ([place])   . Chylous ascites   . Neuroendocrine tumor of stomach   . Protein-calorie malnutrition, severe 03/11/2018  . Gastrointestinal hemorrhage   . Left lower lobe pneumonia ([place])   . SOB (shortness of breath) 03/10/2018  . Decreased breath sounds in left mid-lung field 01/27/2018  . Healthcare maintenance 01/27/2018  . Chronic kidney disease, stage III (moderate) (HCC)   . Mitral valve replaced   . Monoclonal paraproteinemia   . Pleural effusion, left   . 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 Anemia 12/23/2017  . Neuroendocrine cancer ([place]) 12/18/2017  . Hernia of anterior abdominal wall 12/18/2017  . Pseudogout of knee, right 12/18/2017  . 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 Lymphedema   . Pancytopenia ([place])   . AKI (acute kidney injury) ([place])   . Hyponatremia   .  Anticoagulated   . Anasarca 12/01/2017  . Encounter for therapeutic drug monitoring 04/16/2017  . Essential hypertension 07/12/2013  . Prostate cancer ([place]) 07/12/2013  . Chronic anticoagulation 07/12/2013  . History of mitral valve replacement with mechanical valve 01/11/2013   Past Medical History: Past Medical History:  Diagnosis Date  . Anasarca 12/01/2017  . Atrial fibrillation ([place]) 07/12/2013   Perioperative, 2001   . CAD (coronary artery disease) of artery bypass graft 07/12/2013   Saphenous vein graft 2001   . Chronic anticoagulation 07/12/2013  . Essential hypertension 07/12/2013  . History of mitral valve replacement with mechanical valve 01/11/2013   Mechanical mitral valve 2001  Bacterial endocarditis as the cause   . HOH (hard of hearing)   . Prostate cancer ([place]) 07/12/2013  . Renal insufficiency    Past Surgical History: Past Surgical History:  Procedure Laterality Date  . BIOPSY  12/06/2017   Procedure: BIOPSY;  Surgeon: Otis Brace, MD;  Location: [place];  Service: Gastroenterology;;  . BIOPSY  03/04/2018   Procedure: BIOPSY;  Surgeon: Otis Brace, MD;  Location: [facility];  Service: Gastroenterology;;  . COLONOSCOPY WITH PROPOFOL N/A 12/06/2017   Procedure: COLONOSCOPY WITH PROPOFOL ;  Surgeon: Otis Brace, MD;  Location: [place];  Service: Gastroenterology;  Laterality: N/A;  . CORONARY ARTERY BYPASS GRAFT     2001  . ESOPHAGOGASTRODUODENOSCOPY (EGD) WITH PROPOFOL N/A 12/06/2017   Procedure: ESOPHAGOGASTRODUODENOSCOPY (EGD) WITH PROPOFOL;  Surgeon: Otis Brace, MD;  Location: ;  Service: Gastroenterology;  Laterality: N/A;  . ESOPHAGOGASTRODUODENOSCOPY (EGD) WITH PROPOFOL N/A 03/04/2018   Procedure: ESOPHAGOGASTRODUODENOSCOPY (EGD) WITH PROPOFOL;  Surgeon: Otis Brace, MD;  Location: [facility];  Service: Gastroenterology;  Laterality: N/A;  . ESOPHAGOGASTRODUODENOSCOPY (EGD) WITH PROPOFOL N/A 03/15/2018    Procedure: ESOPHAGOGASTRODUODENOSCOPY (EGD) WITH PROPOFOL;  Surgeon: Ronnette Juniper, MD;  Location: [place];  Service: Gastroenterology;  Laterality: N/A;  . IR PARACENTESIS  12/03/2017  . IR PARACENTESIS  01/04/2018  . IR PARACENTESIS  02/04/2018  . IR PARACENTESIS  04/07/2018  . IR RADIOLOGIST EVAL & MGMT  03/29/2018  . IR THORACENTESIS ASP PLEURAL SPACE W/IMG GUIDE  03/30/2018  . MITRAL VALVE REPLACEMENT  2001   Mechanical prosthesis  . POLYPECTOMY  12/06/2017   Procedure: POLYPECTOMY;  Surgeon: Otis Brace, MD;  [facility] ENDOSCOPY;  Service: Gastroenterology;;  . POLYPECTOMY  03/04/2018   Procedure: POLYPECTOMY;  Surgeon: Otis Brace, MD;  Location: [facility];  Service: Gastroenterology;;   Social History: Social History   Tobacco Use  . Smoking status: Former Research scientist (life sciences)  . Smokeless tobacco: Never Used  . Tobacco comment: QUIT IN 09/1982  Substance Use  Topics  . Alcohol use: Not Currently    Comment: NO ALCOHOL SINCE CHRISTMAS 2017  . Drug use: Never   Additional social history:  Lives with daughter Please also refer to relevant sections of EMR.  Family History: Family History  Problem Relation Age of Onset  . Heart attack Father   . Healthy Mother   . Healthy Sister   . Healthy Sister    Allergies and Medications: Allergies  Allergen Reactions  . Penicillins Other (See Comments)    DID THE REACTION INVOLVE: Swelling of the face/tongue/throat, SOB, or low BP? Unknown Sudden or severe rash/hives, skin peeling, or the inside of the mouth or nose? Unknown Did it require medical treatment? Yes When did it last happen?When pt was 73 years old Pt knows that his parents had to take him to the ER at the hospital and has not been retested with any PCN or cephalosporin that he is aware of.   Current Facility-Administered Medications on File Prior to Encounter  Medication Dose Route Frequency Provider Last Rate Last Dose  . albumin human 25 % solution 50 g  50 g  Intravenous PRN Martyn Malay, MD       Current Outpatient Medications on File Prior to Encounter  Medication Sig Dispense Refill  . Amino Acids-Protein Hydrolys (FEEDING SUPPLEMENT, PRO-STAT SUGAR FREE 64,) LIQD Take 30 mLs by mouth 3 (three) times daily with meals. (Patient taking differently: Take 30 mLs by mouth every evening. ) 900 mL 0  . calcium-vitamin D (OSCAL WITH D) 500-200 MG-UNIT tablet Take 1 tablet by mouth daily.     . Camphor-Eucalyptus-Menthol (VICKS VAPORUB EX) Apply 1 application topically See admin instructions. Apply under nose daily in the evening    . Cholecalciferol (VITAMIN D-3) 25 MCG (1000 UT) CAPS Take 1,000 Units by mouth daily.    Marland Kitchen ezetimibe (ZETIA) 10 MG tablet Take 1 tablet (10 mg total) by mouth daily. PLEASE CALL TO SCHEDULE F/U APPT FOR FUTURE REFILLS.Marland Kitchen3RD AND FINAL ATTEMPT! 15 tablet 0  . medium chain triglycerides (MCT OIL) oil Take 15 mLs by mouth 3 (three) times daily with meals. (Patient taking differently: Take 15 mLs by mouth at bedtime. ) 946 mL 12  . Multiple Vitamin (MULTIVITAMIN WITH MINERALS) TABS tablet Take 1 tablet by mouth daily. 90 tablet 3  . Naphazoline-Pheniramine (OPCON-A) 0.027-0.315 % SOLN Place 2-3 drops into both eyes daily as needed (for dry eyes).    . pantoprazole (PROTONIX) 40 MG tablet Take 1 tablet (40 mg total) by mouth 2 (two) times daily. 90 tablet 3  . rosuvastatin (CRESTOR) 40 MG tablet Take 1 tablet (40 mg total) by mouth daily. PLEASE CALL TO SCHEDULE F/U APPT FOR FUTURE REFILLS.Marland Kitchen3RD AND FINAL ATTEMPT! 15 tablet 0  . warfarin (COUMADIN) 1 MG tablet TAKE AS DIRECTED BY ANTICOAGULATION CLINIC (Patient taking differently: Take 0.5-1 mg by mouth See admin instructions. Take 0.5 mg by mouth daily on Monday, Wednesday. Take 1 mg by mouth daily on all other days.) 120 tablet 0   Objective: BP 106/60   Pulse 94   Temp (!) 97.4 F (36.3 C) (Oral)   Resp (!) 36   Ht 5\' 10"  (1.778 m)   Wt 87.3 kg   SpO2 100%   BMI 27.62 kg/m   Exam: General: sitting in bed. Alert and oriented. No acute distress.  Has a nasal cannula on and is pausing for breaths in between each word.   Eyes: constricted pupils.  EOMI. No scleral icterus.  ENTM: moist oral mucosa. Uvula midline.  Neck: no masses.   Cardiovascular: regular rhythm. Normal rate.  Mechanical valve click heard from three feet away.  2+ radial pulses bilaterally   Respiratory: lungs clear to auscultation bilaterally. Lungs sounds diminished on the left side. No wheezes. No crackles.  on 4L O2, speaking one word sentences. Gastrointestinal: distended abdomen.  Positive fluid wave.  Normal bowel sounds. nontender to palpation. Umbilical hernia that has increased redness.  MSK: significant 3+ pitting edema of the legs bilaterally that extends to the knees.  No edema in the upper extremities.  Hands and feet cooler to touch but pulses intact. No pallor or cyanosis.  Derm: patient has stage 3 ulcers on his sacrum. Do not appear infected.  Neuro: cranial nerves grossly intact.  Sensation intact.   Psych: pleasant affect.    Labs and Imaging: CBC BMET  Recent Labs  Lab 04/16/2018 1850  WBC 15.0*  HGB 10.2*  HCT 31.7*  PLT 486*   Recent Labs  Lab 05/03/2018 1850  NA 119*  K 6.9*  CL 89*  CO2 17*  BUN 80*  CREATININE 4.43*  GLUCOSE 106*  CALCIUM 8.1*     Dg Chest 2 View  Result Date: 04/20/2018 CLINICAL DATA:  Shortness of breath and hypoxia. Fell yesterday. EXAM: CHEST - 2 VIEW COMPARISON:  03/30/2018. FINDINGS: Interval large left pleural effusion and mild left lung atelectasis. Small right pleural effusion and small amount of right basilar atelectasis. 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 No pneumothorax. Unremarkable bones. IMPRESSION: 1. Interval large left pleural effusion and mild left lung atelectasis. 2. Small right pleural effusion. 3. Mild right basilar atelectasis. Electronically Signed   By: Claudie Revering M.D.   On: 05/08/2018 18:21   Ct  Head Wo Contrast  Result Date: 04/16/2018 CLINICAL DATA:  Fall yesterday.  Trauma to back of head. EXAM: CT HEAD WITHOUT CONTRAST CT CERVICAL SPINE WITHOUT CONTRAST TECHNIQUE: Multidetector CT imaging of the head and cervical spine was performed following the standard protocol without intravenous contrast. Multiplanar CT image reconstructions of the cervical spine were also generated. COMPARISON:  None. FINDINGS: CT HEAD FINDINGS Brain: Mild atrophy and white matter changes are stable. No acute infarct, hemorrhage, or mass lesion is present. The ventricles are of proportionate to the degree of atrophy. No significant extraaxial fluid collection is present. The brainstem and cerebellum are within normal limits. Vascular: Atherosclerotic calcifications present in the cavernous internal carotid arteries bilaterally and at the dural margin of both vertebral arteries. No hyperdense vessel. Skull: Calvarium is intact. No focal lytic or blastic lesions are present. Sinuses/Orbits: The paranasal sinuses and mastoid air cells are clear. The globes and orbits are within normal limits. CT CERVICAL SPINE FINDINGS Alignment: Grade 1 degenerative anterolisthesis is present at C4-5. AP alignment is otherwise anatomic. Skull base and vertebrae: Craniocervical junction is normal. Acute or healing fractures are present. Soft tissues and spinal canal: Atherosclerotic calcifications are present at the carotid bifurcations bilaterally. High-grade stenosis is suspected on the right. No focal mass lesion is present. Thyroid is normal. No significant adenopathy is present. Disc levels: Osseous foraminal narrowing is present on the left at C4-5 due to uncovertebral and facet disease. 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 Upper chest: A left pleural effusion is noted. IMPRESSION: 1. No acute trauma to the head or cervical spine. 2. Normal CT appearance of the brain for age. 3. Degenerative changes of the cervical spine  are most evident  at C4-5 on the left. 4. 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 5. Left pleural effusion. Electronically Signed   By: San Morelle M.D.   On: 05/01/2018 17:42   Ct Cervical Spine Wo Contrast  Result Date: 05/10/2018 CLINICAL DATA:  Fall yesterday.  Trauma to back of head. EXAM: CT HEAD WITHOUT CONTRAST CT CERVICAL SPINE WITHOUT CONTRAST TECHNIQUE: Multidetector CT imaging of the head and cervical spine was performed following the standard protocol without intravenous contrast. Multiplanar CT image reconstructions of the cervical spine were also generated. COMPARISON:  None. FINDINGS: CT HEAD FINDINGS Brain: Mild atrophy and white matter changes are stable. No acute infarct, hemorrhage, or mass lesion is present. The ventricles are of proportionate to the degree of atrophy. No significant extraaxial fluid collection is present. The brainstem and cerebellum are within normal limits. Vascular: Atherosclerotic calcifications present in the cavernous internal carotid arteries bilaterally and at the dural margin of both vertebral arteries. No hyperdense vessel. Skull: Calvarium is intact. No focal lytic or blastic lesions are present. Sinuses/Orbits: The paranasal sinuses and mastoid air cells are clear. The globes and orbits are within normal limits. CT CERVICAL SPINE FINDINGS Alignment: Grade 1 degenerative anterolisthesis is present at C4-5. AP alignment is otherwise anatomic. Skull base and vertebrae: Craniocervical junction is normal. Acute or healing fractures are present. Soft tissues and spinal canal: Atherosclerotic calcifications are present at the carotid bifurcations bilaterally. High-grade stenosis is suspected on the right. No focal mass lesion is present. Thyroid is normal. No significant adenopathy is present. Disc levels: Osseous foraminal narrowing is present on the left at C4-5 due to uncovertebral and facet disease. 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 Upper chest: A  left pleural effusion is noted. IMPRESSION: 1. No acute trauma to the head or cervical spine. 2. Normal CT appearance of the brain for age. 3. Degenerative changes of the cervical spine are most evident at C4-5 on the left. 4. 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 5. Left pleural effusion. Electronically Signed   By: San Morelle M.D.   On: 05/07/2018 17:42   Dg Hand Complete Left  Result Date: 04/24/2018 CLINICAL DATA:  Left hand pain and swelling following a fall yesterday. EXAM: LEFT HAND - COMPLETE 3+ VIEW COMPARISON:  None. FINDINGS: Portions of the index finger obscured by a pulse oximeter. Screw and plate fixation of an old, healed distal radius fracture. No acute fracture or dislocation is seen. Diffuse arterial calcifications are noted. Mild diffuse soft tissue swelling. IMPRESSION: No acute fracture or dislocation. Electronically Signed   By: Claudie Revering M.D.   On: 04/30/2018 18:23   Dg Foot Complete Left  Result Date: 05/09/2018 CLINICAL DATA:  Left foot pain and swelling following a fall yesterday. EXAM: LEFT FOOT - COMPLETE 3+ VIEW COMPARISON:  None. FINDINGS: Marked diffuse soft tissue swelling. Atheromatous arterial calcifications. No fracture or dislocation. Mild to moderate inferior calcaneal spur formation. IMPRESSION: 1. No fracture or dislocation. 2. Marked diffuse soft tissue swelling. Electronically Signed   By: Claudie Revering M.D.   On: 04/28/2018 18:22   Benay Pike, MD 05/01/2018, 10:42 PM PGY-1, [place] Intern pager: [phone], text pages welcome  FPTS Upper-Level Resident Addendum   I have independently interviewed and examined the patient. I have discussed the above with the original author and agree with their documentation. My edits for correction/addition/clarification are in green. Please see also any attending notes.    Rory Percy, DO PGY-2, [place] [facility] 04/19/2018 12:18  AM  FPTS Service pager: [phone] (text pages welcome  through [facility])

## 2018-04-18 NOTE — ED Triage Notes (Signed)
Pt absent from room for testing

## 2018-04-18 NOTE — ED Triage Notes (Signed)
PT is absent from room for testing

## 2018-04-18 NOTE — ED Provider Notes (Addendum)
[facility] EMERGENCY DEPARTMENT Provider Note   CSN: [id number] Arrival date & time: 05/13/2018  1550     History   Chief Complaint Chief Complaint  Patient presents with  . Fall    HPI Mario Powell is a 73 y.o. male.  Patient is a 73 year old male with a history of coronary artery disease status post bypass surgery in 2001, he is on chronic Coumadin, he had an episode of A. fib during his hospitalization 2001.  He has recurrent chylous ascites.  He is brought in today by EMS after a fall that he had yesterday.  He was trying to get into his jeep.  He said he just wanted to sit in his jeep and fell backward onto his left side.  It is unclear exactly what happened but he has been complaining of soreness across his upper back and neck and chest since that time.  There was not any apparent loss of consciousness.  He has had no vomiting.  He has had recurrent ascites and his daughter states that the ascites is building up more frequently.  She had called the pt's PCP this morning to try to set up a paracentesis.  He has become more short of breath and distended today.     Past Medical History:  Diagnosis Date  . Anasarca 12/01/2017  . Atrial fibrillation ([place]) 07/12/2013   Perioperative, 2001   . CAD (coronary artery disease) of artery bypass graft 07/12/2013   Saphenous vein graft 2001   . Chronic anticoagulation 07/12/2013  . Essential hypertension 07/12/2013  . History of mitral valve replacement with mechanical valve 01/11/2013   Mechanical mitral valve 2001  Bacterial endocarditis as the cause   . HOH (hard of hearing)   . Prostate cancer ([place]) 07/12/2013  . Renal insufficiency     Patient Active Problem List   Diagnosis Date Noted  . Shortness of breath 04/25/2018  . Pressure injury of skin 03/21/2018  . Chronic respiratory failure ([place])   . Tracheostomy care ([place])   . Palliative care by specialist   . Acute blood loss anemia   . Coronary artery disease  involving coronary bypass graft of native heart without angina pectoris   . PAF (paroxysmal atrial fibrillation) ([place])   . Benign essential HTN   . Chronic kidney disease (CKD), stage IV (severe) ([place])   . Chylous ascites   . Neuroendocrine tumor of stomach   . Protein-calorie malnutrition, severe 03/11/2018  . Gastrointestinal hemorrhage   . Left lower lobe pneumonia ([place])   . SOB (shortness of breath) 03/10/2018  . Decreased breath sounds in left mid-lung field 01/27/2018  . Healthcare maintenance 01/27/2018  . Chronic kidney disease, stage III (moderate) (H[facility])   . Mitral valve replaced   . Monoclonal paraproteinemia   . Pleural effusion, left   . 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 Anemia 12/23/2017  . Neuroendocrine cancer ([place]) 12/18/2017  . Hernia of anterior abdominal wall 12/18/2017  . Pseudogout of knee, right 12/18/2017  . 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 Lymphedema   . Pancytopenia ([place])   . AKI (acute kidney injury) ([place])   . Hyponatremia   . Anticoagulated   . Anasarca 12/01/2017  . Encounter for therapeutic drug monitoring 04/16/2017  . Essential hypertension 07/12/2013  . Prostate cancer ([place]) 07/12/2013  . Chronic anticoagulation 07/12/2013  . History of mitral valve replacement with mechanical valve 01/11/2013    Past Surgical History:  Procedure  Laterality Date  . BIOPSY  12/06/2017   Procedure: BIOPSY;  Surgeon: Otis Brace, MD;  Location: [facility];  Service: Gastroenterology;;  . BIOPSY  03/04/2018   Procedure: BIOPSY;  Surgeon: Otis Brace, MD;  Location: [facility];  Service: Gastroenterology;;  . COLONOSCOPY WITH PROPOFOL N/A 12/06/2017   Procedure: COLONOSCOPY WITH PROPOFOL ;  Surgeon: Otis Brace, MD;  Location: [facility];  Service: Gastroenterology;  Laterality: N/A;  . CORONARY ARTERY BYPASS GRAFT     2001  . ESOPHAGOGASTRODUODENOSCOPY (EGD) WITH PROPOFOL N/A 12/06/2017    Procedure: ESOPHAGOGASTRODUODENOSCOPY (EGD) WITH PROPOFOL;  Surgeon: Otis Brace, MD;  Location: [facility];  Service: Gastroenterology;  Laterality: N/A;  . ESOPHAGOGASTRODUODENOSCOPY (EGD) WITH PROPOFOL N/A 03/04/2018   Procedure: ESOPHAGOGASTRODUODENOSCOPY (EGD) WITH PROPOFOL;  Surgeon: Otis Brace, MD;  Location: [facility];  Service: Gastroenterology;  Laterality: N/A;  . ESOPHAGOGASTRODUODENOSCOPY (EGD) WITH PROPOFOL N/A 03/15/2018   Procedure: ESOPHAGOGASTRODUODENOSCOPY (EGD) WITH PROPOFOL;  Surgeon: Ronnette Juniper, MD;  Location: [facility];  Service: Gastroenterology;  Laterality: N/A;  . IR PARACENTESIS  12/03/2017  . IR PARACENTESIS  01/04/2018  . IR PARACENTESIS  02/04/2018  . IR PARACENTESIS  04/07/2018  . IR RADIOLOGIST EVAL & MGMT  03/29/2018  . IR THORACENTESIS ASP PLEURAL SPACE W/IMG GUIDE  03/30/2018  . MITRAL VALVE REPLACEMENT  2001   Mechanical prosthesis  . POLYPECTOMY  12/06/2017   Procedure: POLYPECTOMY;  Surgeon: Otis Brace, MD;  Location: [facility] ENDOSCOPY;  Service: Gastroenterology;;  . POLYPECTOMY  03/04/2018   Procedure: POLYPECTOMY;  Surgeon: Otis Brace, MD;  Location: [facility];  Service: Gastroenterology;;        Home Medications    Prior to Admission medications   Medication Sig Start Date End Date Taking? Authorizing Provider  Amino Acids-Protein Hydrolys (FEEDING SUPPLEMENT, PRO-STAT SUGAR FREE 64,) LIQD Take 30 mLs by mouth 3 (three) times daily with meals. Patient taking differently: Take 30 mLs by mouth every evening.  12/09/17  Yes Wilber Oliphant, MD  calcium-vitamin D (OSCAL WITH D) 500-200 MG-UNIT tablet Take 1 tablet by mouth daily.    Yes [provider]  Camphor-Eucalyptus-Menthol (VICKS VAPORUB EX) Apply 1 application topically See admin instructions. Apply under nose daily in the evening   Yes [provider]  Cholecalciferol (VITAMIN D-3) 25 MCG (1000 UT) CAPS Take 1,000 Units by mouth daily.   Yes  [provider]  ezetimibe (ZETIA) 10 MG tablet Take 1 tablet (10 mg total) by mouth daily. PLEASE CALL TO SCHEDULE F/U APPT FOR FUTURE REFILLS.Marland Kitchen3RD AND FINAL ATTEMPT! 02/25/18  Yes Belva Crome, MD  medium chain triglycerides (MCT OIL) oil Take 15 mLs by mouth 3 (three) times daily with meals. Patient taking differently: Take 15 mLs by mouth at bedtime.  12/08/17  Yes Wilber Oliphant, MD  Multiple Vitamin (MULTIVITAMIN WITH MINERALS) TABS tablet Take 1 tablet by mouth daily. 12/09/17  Yes Wilber Oliphant, MD  Naphazoline-Pheniramine (OPCON-A) 0.027-0.315 % SOLN Place 2-3 drops into both eyes daily as needed (for dry eyes).   Yes [provider]  pantoprazole (PROTONIX) 40 MG tablet Take 1 tablet (40 mg total) by mouth 2 (two) times daily. 03/22/18  Yes Meccariello, Bernita Raisin, DO  rosuvastatin (CRESTOR) 40 MG tablet Take 1 tablet (40 mg total) by mouth daily. PLEASE CALL TO SCHEDULE F/U APPT FOR FUTURE REFILLS.Marland Kitchen3RD AND FINAL ATTEMPT! 02/25/18  Yes Belva Crome, MD  warfarin (COUMADIN) 1 MG tablet TAKE AS [place] Patient taking differently: Take 0.5-1 mg  by mouth See admin instructions. Take 0.5 mg by mouth daily on Monday, Wednesday. Take 1 mg by mouth daily on all other days. 01/24/18  Yes Belva Crome, MD    Family History Family History  Problem Relation Age of Onset  . Heart attack Father   . Healthy Mother   . Healthy Sister   . Healthy Sister     Social History Social History   Tobacco Use  . Smoking status: Former Research scientist (life sciences)  . Smokeless tobacco: Never Used  . Tobacco comment: QUIT IN 09/1982  Substance Use Topics  . Alcohol use: Not Currently    Comment: NO ALCOHOL SINCE CHRISTMAS 2017  . Drug use: Never     Allergies   Penicillins   Review of Systems Review of Systems  Constitutional: Positive for fatigue. Negative for chills, diaphoresis and fever.  HENT: Negative for congestion, rhinorrhea and sneezing.   Eyes: Negative.     Respiratory: Positive for shortness of breath. Negative for cough and chest tightness.   Cardiovascular: Positive for chest pain (Reports soreness across his chest and upper back from keeping his head up for several hours after he fell) and leg swelling.  Gastrointestinal: Positive for abdominal distention. Negative for abdominal pain, blood in stool, diarrhea, nausea and vomiting.  Genitourinary: Negative for difficulty urinating, flank pain, frequency and hematuria.  Musculoskeletal: Positive for arthralgias. Negative for back pain.  Skin: Negative for rash.  Neurological: Negative for dizziness, speech difficulty, weakness, numbness and headaches.     Physical Exam Updated Vital Signs BP 108/69   Pulse 94   Temp (!) 97.4 F (36.3 C) (Oral)   Resp (!) 36   Ht 5\' 10"  (1.778 m)   Wt 87.3 kg   SpO2 100%   BMI 27.62 kg/m   Physical Exam Constitutional:      Appearance: He is well-developed. He is ill-appearing.  HENT:     Head: Normocephalic and atraumatic.  Eyes:     Pupils: Pupils are equal, round, and reactive to light.  Neck:     Comments: C-collar in place Cardiovascular:     Rate and Rhythm: Regular rhythm. Tachycardia present.     Heart sounds: Normal heart sounds.  Pulmonary:     Effort: Pulmonary effort is normal. Tachypnea present. No respiratory distress.     Breath sounds: Rales present. No wheezing.  Chest:     Chest wall: No tenderness.  Abdominal:     General: Bowel sounds are normal. There is distension.     Palpations: Abdomen is soft.     Tenderness: There is no abdominal tenderness. There is no guarding or rebound.  Musculoskeletal: Normal range of motion.     Comments: Patient has marked lower extremity edema which is chronic and at baseline per his daughter.  He has some ecchymosis to the underside of his left foot but no definitive tenderness.  He also has some mild swelling of his left hand with some overlying ecchymosis.  There is no other pain on  palpation or range of motion of the extremities, he has some mild tenderness on palpation of the cervical spine but no pain to the thoracic or lumbosacral spine.  Lymphadenopathy:     Cervical: No cervical adenopathy.  Skin:    General: Skin is warm and dry.     Findings: No rash.  Neurological:     Mental Status: He is alert and oriented to person, place, and time.      ED Treatments /  Results  Labs (all labs ordered are listed, but only abnormal results are displayed) Labs Reviewed  COMPREHENSIVE METABOLIC PANEL - Abnormal; Notable for the following components:      Result Value   Sodium 119 (*)    Potassium 6.9 (*)    Chloride 89 (*)    CO2 17 (*)    Glucose, Bld 106 (*)    BUN 80 (*)    Creatinine, Ser 4.43 (*)    Calcium 8.1 (*)    Total Protein 5.2 (*)    Albumin 1.7 (*)    AST 99 (*)    ALT 88 (*)    Alkaline Phosphatase 289 (*)    GFR calc non Af Amer 12 (*)    GFR calc Af Amer 14 (*)    All other components within normal limits  CBC WITH DIFFERENTIAL/PLATELET - Abnormal; Notable for the following components:   WBC 15.0 (*)    RBC 3.57 (*)    Hemoglobin 10.2 (*)    HCT 31.7 (*)    RDW 16.1 (*)    Platelets 486 (*)    Neutro Abs 13.7 (*)    Lymphs Abs 0.1 (*)    Abs Immature Granulocytes 0.12 (*)    All other components within normal limits  PROTIME-INR - Abnormal; Notable for the following components:   Prothrombin Time 54.8 (*)    INR 6.33 (*)    All other components within normal limits  BASIC METABOLIC PANEL    EKG EKG Interpretation  Date/Time:  Monday April 18 2018 20:09:27 EST Ventricular Rate:  91 PR Interval:    QRS Duration: 100 QT Interval:  318 QTC Calculation: 392 R Axis:   18 Text Interpretation:  Sinus rhythm Short PR interval Low voltage, extremity and precordial leads since last tracing no significant change Confirmed by Malvin Johns [phone]) on 04/29/2018 8:18:03 PM   Radiology Dg Chest 2 View  Result Date:  05/13/2018 CLINICAL DATA:  Shortness of breath and hypoxia. Fell yesterday. EXAM: CHEST - 2 VIEW COMPARISON:  03/30/2018. FINDINGS: Interval large left pleural effusion and mild left lung atelectasis. Small right pleural effusion and small amount of right basilar atelectasis. 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 No pneumothorax. Unremarkable bones. IMPRESSION: 1. Interval large left pleural effusion and mild left lung atelectasis. 2. Small right pleural effusion. 3. Mild right basilar atelectasis. Electronically Signed   By: Claudie Revering M.D.   On: 04/29/2018 18:21   Ct Head Wo Contrast  Result Date: 04/30/2018 CLINICAL DATA:  Fall yesterday.  Trauma to back of head. EXAM: CT HEAD WITHOUT CONTRAST CT CERVICAL SPINE WITHOUT CONTRAST TECHNIQUE: Multidetector CT imaging of the head and cervical spine was performed following the standard protocol without intravenous contrast. Multiplanar CT image reconstructions of the cervical spine were also generated. COMPARISON:  None. FINDINGS: CT HEAD FINDINGS Brain: Mild atrophy and white matter changes are stable. No acute infarct, hemorrhage, or mass lesion is present. The ventricles are of proportionate to the degree of atrophy. No significant extraaxial fluid collection is present. The brainstem and cerebellum are within normal limits. Vascular: Atherosclerotic calcifications present in the cavernous internal carotid arteries bilaterally and at the dural margin of both vertebral arteries. No hyperdense vessel. Skull: Calvarium is intact. No focal lytic or blastic lesions are present. Sinuses/Orbits: The paranasal sinuses and mastoid air cells are clear. The globes and orbits are within normal limits. CT CERVICAL SPINE FINDINGS Alignment: Grade 1 degenerative anterolisthesis is present at C4-5. AP  alignment is otherwise anatomic. Skull base and vertebrae: Craniocervical junction is normal. Acute or healing fractures are present. Soft tissues and spinal canal:  Atherosclerotic calcifications are present at the carotid bifurcations bilaterally. High-grade stenosis is suspected on the right. No focal mass lesion is present. Thyroid is normal. No significant adenopathy is present. Disc levels: Osseous foraminal narrowing is present on the left at C4-5 due to uncovertebral and facet disease. 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 Upper chest: A left pleural effusion is noted. IMPRESSION: 1. No acute trauma to the head or cervical spine. 2. Normal CT appearance of the brain for age. 3. Degenerative changes of the cervical spine are most evident at C4-5 on the left. 4. 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 5. Left pleural effusion. Electronically Signed   By: San Morelle M.D.   On: 05/04/2018 17:42   Ct Cervical Spine Wo Contrast  Result Date: 04/25/2018 CLINICAL DATA:  Fall yesterday.  Trauma to back of head. EXAM: CT HEAD WITHOUT CONTRAST CT CERVICAL SPINE WITHOUT CONTRAST TECHNIQUE: Multidetector CT imaging of the head and cervical spine was performed following the standard protocol without intravenous contrast. Multiplanar CT image reconstructions of the cervical spine were also generated. COMPARISON:  None. FINDINGS: CT HEAD FINDINGS Brain: Mild atrophy and white matter changes are stable. No acute infarct, hemorrhage, or mass lesion is present. The ventricles are of proportionate to the degree of atrophy. No significant extraaxial fluid collection is present. The brainstem and cerebellum are within normal limits. Vascular: Atherosclerotic calcifications present in the cavernous internal carotid arteries bilaterally and at the dural margin of both vertebral arteries. No hyperdense vessel. Skull: Calvarium is intact. No focal lytic or blastic lesions are present. Sinuses/Orbits: The paranasal sinuses and mastoid air cells are clear. The globes and orbits are within normal limits. CT CERVICAL SPINE FINDINGS Alignment: Grade 1 degenerative anterolisthesis is  present at C4-5. AP alignment is otherwise anatomic. Skull base and vertebrae: Craniocervical junction is normal. Acute or healing fractures are present. Soft tissues and spinal canal: Atherosclerotic calcifications are present at the carotid bifurcations bilaterally. High-grade stenosis is suspected on the right. No focal mass lesion is present. Thyroid is normal. No significant adenopathy is present. Disc levels: Osseous foraminal narrowing is present on the left at C4-5 due to uncovertebral and facet disease. 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 Upper chest: A left pleural effusion is noted. IMPRESSION: 1. No acute trauma to the head or cervical spine. 2. Normal CT appearance of the brain for age. 3. Degenerative changes of the cervical spine are most evident at C4-5 on the left. 4. 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 5. Left pleural effusion. Electronically Signed   By: San Morelle M.D.   On: 04/20/2018 17:42   Dg Hand Complete Left  Result Date: 05/09/2018 CLINICAL DATA:  Left hand pain and swelling following a fall yesterday. EXAM: LEFT HAND - COMPLETE 3+ VIEW COMPARISON:  None. FINDINGS: Portions of the index finger obscured by a pulse oximeter. Screw and plate fixation of an old, healed distal radius fracture. No acute fracture or dislocation is seen. Diffuse arterial calcifications are noted. Mild diffuse soft tissue swelling. IMPRESSION: No acute fracture or dislocation. Electronically Signed   By: Claudie Revering M.D.   On: 04/25/2018 18:23   Dg Foot Complete Left  Result Date: 05/03/2018 CLINICAL DATA:  Left foot pain and swelling following a fall yesterday. EXAM: LEFT FOOT - COMPLETE 3+ VIEW COMPARISON:  None. FINDINGS: Marked  diffuse soft tissue swelling. Atheromatous arterial calcifications. No fracture or dislocation. Mild to moderate inferior calcaneal spur formation. IMPRESSION: 1. No fracture or dislocation. 2. Marked diffuse soft tissue swelling. Electronically Signed   By:  Claudie Revering M.D.   On: 04/26/2018 18:22    Procedures Procedures (including critical care time)  Medications Ordered in ED Medications  sodium zirconium cyclosilicate (LOKELMA) packet 5 g (5 g Oral Given 04/17/2018 2114)  albuterol (PROVENTIL) (2.5 MG/3ML) 0.083% nebulizer solution 5 mg (5 mg Nebulization Given 05/02/2018 2134)     Initial Impression / Assessment and Plan / ED Course  I have reviewed the triage vital signs and the nursing notes.  Pertinent labs & imaging results that were available during my care of the patient were reviewed by me and considered in my medical decision making (see chart for details).     Patient is a 73 year old male who presents after a fall.  Imaging studies do not reveal any evidence of traumatic injuries related to his fall.  He does have worsening ascites and chest x-ray reveals a large left pleural effusion.  He is tachypneic but is maintaining normal oxygen saturation on a nasal cannula.  His labs show acute on chronic kidney failure with moderate hyperkalemia.  He also has a markedly elevated INR.  His EKG does not show any changes due to the hyperkalemia.  He was started on [facility] and an albuterol neb for the hyperkalemia.  He likely will need thoracentesis but not until his INR has improved.  I spoke with the family medicine resident who will admit the patient for further treatment.  CRITICAL CARE Performed by: Malvin Johns Total critical care time: 60 minutes Critical care time was exclusive of separately billable procedures and treating other patients. Critical care was necessary to treat or prevent imminent or life-threatening deterioration. Critical care was time spent personally by me on the following activities: development of treatment plan with patient and/or surrogate as well as nursing, discussions with consultants, evaluation of patient's response to treatment, examination of patient, obtaining history from patient or surrogate, ordering and  performing treatments and interventions, ordering and review of laboratory studies, ordering and review of radiographic studies, pulse oximetry and re-evaluation of patient's condition.   Final Clinical Impressions(s) / ED Diagnoses   Final diagnoses:  Fall, initial encounter  Acute renal failure superimposed on chronic kidney disease, unspecified CKD stage, unspecified acute renal failure type ([place])  Pleural effusion  Other ascites  Hyperkalemia  Elevated INR    ED Discharge Orders    None       Malvin Johns, MD 04/30/2018 4975    Malvin Johns, MD 04/22/2018 2227

## 2018-04-19 ENCOUNTER — Inpatient Hospital Stay (HOSPITAL_COMMUNITY): Payer: Medicare Other

## 2018-04-19 ENCOUNTER — Other Ambulatory Visit: Payer: Self-pay

## 2018-04-19 DIAGNOSIS — N179 Acute kidney failure, unspecified: Secondary | ICD-10-CM

## 2018-04-19 DIAGNOSIS — R0603 Acute respiratory distress: Secondary | ICD-10-CM

## 2018-04-19 DIAGNOSIS — R0602 Shortness of breath: Secondary | ICD-10-CM

## 2018-04-19 DIAGNOSIS — N189 Chronic kidney disease, unspecified: Secondary | ICD-10-CM

## 2018-04-19 DIAGNOSIS — J90 Pleural effusion, not elsewhere classified: Secondary | ICD-10-CM

## 2018-04-19 DIAGNOSIS — E875 Hyperkalemia: Secondary | ICD-10-CM

## 2018-04-19 DIAGNOSIS — R188 Other ascites: Secondary | ICD-10-CM

## 2018-04-19 DIAGNOSIS — I898 Other specified noninfective disorders of lymphatic vessels and lymph nodes: Secondary | ICD-10-CM

## 2018-04-19 LAB — CBC
HCT: 29.2 % — ABNORMAL LOW (ref 39.0–52.0)
Hemoglobin: 9.2 g/dL — ABNORMAL LOW (ref 13.0–17.0)
MCH: 27.5 pg (ref 26.0–34.0)
MCHC: 31.5 g/dL (ref 30.0–36.0)
MCV: 87.4 fL (ref 80.0–100.0)
Platelets: 401 10*3/uL — ABNORMAL HIGH (ref 150–400)
RBC: 3.34 MIL/uL — ABNORMAL LOW (ref 4.22–5.81)
RDW: 16 % — ABNORMAL HIGH (ref 11.5–15.5)
WBC: 10.6 10*3/uL — ABNORMAL HIGH (ref 4.0–10.5)
nRBC: 0 % (ref 0.0–0.2)

## 2018-04-19 LAB — BASIC METABOLIC PANEL
Anion gap: 11 (ref 5–15)
Anion gap: 13 (ref 5–15)
BUN: 79 mg/dL — ABNORMAL HIGH (ref 8–23)
BUN: 84 mg/dL — ABNORMAL HIGH (ref 8–23)
CO2: 16 mmol/L — AB (ref 22–32)
CO2: 17 mmol/L — ABNORMAL LOW (ref 22–32)
CREATININE: 4.54 mg/dL — AB (ref 0.61–1.24)
Calcium: 7.9 mg/dL — ABNORMAL LOW (ref 8.9–10.3)
Calcium: 8.2 mg/dL — ABNORMAL LOW (ref 8.9–10.3)
Chloride: 90 mmol/L — ABNORMAL LOW (ref 98–111)
Chloride: 91 mmol/L — ABNORMAL LOW (ref 98–111)
Creatinine, Ser: 4.78 mg/dL — ABNORMAL HIGH (ref 0.61–1.24)
GFR calc Af Amer: 13 mL/min — ABNORMAL LOW (ref >60)
GFR calc Af Amer: 14 mL/min — ABNORMAL LOW (ref >60)
GFR calc non Af Amer: 11 mL/min — ABNORMAL LOW (ref >60)
GFR calc non Af Amer: 12 mL/min — ABNORMAL LOW (ref >60)
Glucose, Bld: 104 mg/dL — ABNORMAL HIGH (ref 70–99)
Glucose, Bld: 99 mg/dL (ref 70–99)
Potassium: 6.4 mmol/L (ref 3.5–5.1)
Potassium: 6.8 mmol/L (ref 3.5–5.1)
Sodium: 118 mmol/L — CL (ref 135–145)
Sodium: 120 mmol/L — ABNORMAL LOW (ref 135–145)

## 2018-04-19 LAB — GLUCOSE, CAPILLARY
GLUCOSE-CAPILLARY: 95 mg/dL (ref 70–99)
Glucose-Capillary: 102 mg/dL — ABNORMAL HIGH (ref 70–99)
Glucose-Capillary: 80 mg/dL (ref 70–99)
Glucose-Capillary: 83 mg/dL (ref 70–99)

## 2018-04-19 LAB — POTASSIUM
POTASSIUM: 6.3 mmol/L — AB (ref 3.5–5.1)
POTASSIUM: 6.2 mmol/L — AB (ref 3.5–5.1)
Potassium: 6.5 mmol/L (ref 3.5–5.1)
Potassium: 6 mmol/L — ABNORMAL HIGH (ref 3.5–5.1)

## 2018-04-19 LAB — URINALYSIS, ROUTINE W REFLEX MICROSCOPIC
Bilirubin Urine: NEGATIVE
Glucose, UA: NEGATIVE mg/dL
Ketones, ur: NEGATIVE mg/dL
Nitrite: NEGATIVE
PH: 5 (ref 5.0–8.0)
Protein, ur: 30 mg/dL — AB
RBC / HPF: >50 RBC/hpf — ABNORMAL HIGH (ref 0–5)
SPECIFIC GRAVITY, URINE: 1.015 (ref 1.005–1.030)

## 2018-04-19 LAB — NA AND K (SODIUM & POTASSIUM), RAND UR
Potassium Urine: 46 mmol/L
Sodium, Ur: <10 mmol/L

## 2018-04-19 LAB — PROTEIN / CREATININE RATIO, URINE
CREATININE, URINE: 133.46 mg/dL
Protein Creatinine Ratio: 0.94 mg/mg{Cre} — ABNORMAL HIGH (ref 0.00–0.15)
TOTAL PROTEIN, URINE: 125 mg/dL

## 2018-04-19 LAB — COMPREHENSIVE METABOLIC PANEL
ALT: 77 U/L — AB (ref 0–44)
AST: 87 U/L — ABNORMAL HIGH (ref 15–41)
Albumin: 1.4 g/dL — ABNORMAL LOW (ref 3.5–5.0)
Alkaline Phosphatase: 269 U/L — ABNORMAL HIGH (ref 38–126)
Anion gap: 6 (ref 5–15)
BUN: 88 mg/dL — ABNORMAL HIGH (ref 8–23)
CALCIUM: 8 mg/dL — AB (ref 8.9–10.3)
CO2: 21 mmol/L — ABNORMAL LOW (ref 22–32)
CREATININE: 4.75 mg/dL — AB (ref 0.61–1.24)
Chloride: 92 mmol/L — ABNORMAL LOW (ref 98–111)
GFR calc non Af Amer: 11 mL/min — ABNORMAL LOW (ref >60)
GFR, EST AFRICAN AMERICAN: 13 mL/min — AB (ref >60)
Glucose, Bld: 94 mg/dL (ref 70–99)
Potassium: 6.8 mmol/L (ref 3.5–5.1)
Sodium: 119 mmol/L — CL (ref 135–145)
Total Bilirubin: 1.2 mg/dL (ref 0.3–1.2)
Total Protein: 4.7 g/dL — ABNORMAL LOW (ref 6.5–8.1)

## 2018-04-19 LAB — VITAMIN B12: VITAMIN B 12: 357 pg/mL (ref 180–914)

## 2018-04-19 LAB — BLOOD GAS, ARTERIAL
Acid-base deficit: 5.7 mmol/L — ABNORMAL HIGH (ref 0.0–2.0)
Bicarbonate: 18.1 mmol/L — ABNORMAL LOW (ref 20.0–28.0)
Drawn by: 406621
O2 Content: 5 L/min
O2 Saturation: 97.3 %
PH ART: 7.412 (ref 7.350–7.450)
Patient temperature: 98.6
pCO2 arterial: 28.9 mmHg — ABNORMAL LOW (ref 32.0–48.0)
pO2, Arterial: 88.4 mmHg (ref 83.0–108.0)

## 2018-04-19 LAB — PROTIME-INR
INR: 6.73
INR: 2.24
Prothrombin Time: 57.4 seconds — ABNORMAL HIGH (ref 11.4–15.2)
Prothrombin Time: 24.4 seconds — ABNORMAL HIGH (ref 11.4–15.2)

## 2018-04-19 LAB — CORTISOL-AM, BLOOD: Cortisol - AM: 33.9 ug/dL — ABNORMAL HIGH (ref 6.7–22.6)

## 2018-04-19 LAB — SODIUM, URINE, RANDOM: Sodium, Ur: <10 mmol/L

## 2018-04-19 LAB — OSMOLALITY: OSMOLALITY: 279 mosm/kg (ref 275–295)

## 2018-04-19 LAB — AMMONIA: Ammonia: 26 umol/L (ref 9–35)

## 2018-04-19 LAB — MAGNESIUM: MAGNESIUM: 2.6 mg/dL — AB (ref 1.7–2.4)

## 2018-04-19 LAB — MRSA PCR SCREENING: MRSA by PCR: NEGATIVE

## 2018-04-19 LAB — TSH: TSH: 9.09 u[IU]/mL — ABNORMAL HIGH (ref 0.350–4.500)

## 2018-04-19 LAB — OSMOLALITY, URINE: Osmolality, Ur: 352 mOsm/kg (ref 300–900)

## 2018-04-19 MED ORDER — ALBUMIN HUMAN 25 % IV SOLN
25.0000 g | Freq: Once | INTRAVENOUS | Status: AC
  Administered 2018-04-19: 25 g via INTRAVENOUS
  Filled 2018-04-19: qty 100

## 2018-04-19 MED ORDER — ALBUMIN HUMAN 25 % IV SOLN
12.5000 g | Freq: Once | INTRAVENOUS | Status: AC
  Administered 2018-04-19: 12.5 g via INTRAVENOUS
  Filled 2018-04-19: qty 50

## 2018-04-19 MED ORDER — SODIUM BICARBONATE 650 MG PO TABS
1300.0000 mg | ORAL_TABLET | Freq: Three times a day (TID) | ORAL | Status: DC
  Administered 2018-04-19: 1300 mg via ORAL
  Filled 2018-04-19: qty 2

## 2018-04-19 MED ORDER — METOPROLOL TARTRATE 5 MG/5ML IV SOLN
2.5000 mg | INTRAVENOUS | Status: DC | PRN
  Administered 2018-04-19: 2.5 mg via INTRAVENOUS

## 2018-04-19 MED ORDER — VITAMIN K1 10 MG/ML IJ SOLN
2.5000 mg | INTRAVENOUS | Status: AC
  Administered 2018-04-19: 2.5 mg via INTRAVENOUS
  Filled 2018-04-19: qty 0.25

## 2018-04-19 MED ORDER — DEXTROSE 50 % IV SOLN
1.0000 | Freq: Once | INTRAVENOUS | Status: AC
  Administered 2018-04-19: 50 mL via INTRAVENOUS
  Filled 2018-04-19: qty 50

## 2018-04-19 MED ORDER — PRISMASOL BGK 4/2.5 32-4-2.5 MEQ/L REPLACEMENT SOLN
Status: DC
  Administered 2018-04-19 – 2018-04-21 (×2): via INTRAVENOUS_CENTRAL
  Filled 2018-04-19 (×3): qty 5000

## 2018-04-19 MED ORDER — PRISMASOL BGK 4/2.5 32-4-2.5 MEQ/L REPLACEMENT SOLN
Status: DC
  Administered 2018-04-19: via INTRAVENOUS_CENTRAL
  Filled 2018-04-19: qty 5000

## 2018-04-19 MED ORDER — SODIUM BICARBONATE 8.4 % IV SOLN
50.0000 meq | Freq: Once | INTRAVENOUS | Status: DC
  Filled 2018-04-19: qty 50

## 2018-04-19 MED ORDER — INSULIN ASPART 100 UNIT/ML IV SOLN
10.0000 [IU] | Freq: Once | INTRAVENOUS | Status: AC
  Administered 2018-04-19: 10 [IU] via INTRAVENOUS

## 2018-04-19 MED ORDER — HEPARIN (PORCINE) 2000 UNITS/L FOR CRRT
INTRAVENOUS_CENTRAL | Status: DC | PRN
  Filled 2018-04-19: qty 1000

## 2018-04-19 MED ORDER — METOPROLOL TARTRATE 5 MG/5ML IV SOLN
2.5000 mg | Freq: Once | INTRAVENOUS | Status: AC
  Administered 2018-04-19: 2.5 mg via INTRAVENOUS

## 2018-04-19 MED ORDER — LIDOCAINE HCL (PF) 1 % IJ SOLN
INTRAMUSCULAR | Status: AC
  Filled 2018-04-19: qty 30

## 2018-04-19 MED ORDER — PRISMASOL BGK 4/2.5 32-4-2.5 MEQ/L REPLACEMENT SOLN
Status: DC
  Filled 2018-04-19: qty 5000

## 2018-04-19 MED ORDER — ALBUMIN HUMAN 5 % IV SOLN
INTRAVENOUS | Status: AC
  Filled 2018-04-19: qty 250

## 2018-04-19 MED ORDER — HEPARIN SODIUM (PORCINE) 1000 UNIT/ML DIALYSIS
1000.0000 [IU] | INTRAMUSCULAR | Status: DC | PRN
  Filled 2018-04-19: qty 6

## 2018-04-19 MED ORDER — SODIUM ZIRCONIUM CYCLOSILICATE 10 G PO PACK
10.0000 g | PACK | Freq: Every day | ORAL | Status: DC
  Administered 2018-04-19: 10 g via ORAL
  Filled 2018-04-19 (×2): qty 1

## 2018-04-19 MED ORDER — DEXTROSE 10 % IV SOLN
Freq: Once | INTRAVENOUS | Status: AC
  Administered 2018-04-19: 07:00:00 via INTRAVENOUS

## 2018-04-19 MED ORDER — SODIUM CHLORIDE 0.9% IV SOLUTION
Freq: Once | INTRAVENOUS | Status: AC
  Administered 2018-04-19: 15:00:00 via INTRAVENOUS

## 2018-04-19 MED ORDER — PHYTONADIONE 5 MG PO TABS
10.0000 mg | ORAL_TABLET | Freq: Every day | ORAL | Status: DC
  Administered 2018-04-19: 10 mg via ORAL
  Filled 2018-04-19 (×4): qty 2

## 2018-04-19 MED ORDER — SODIUM BICARBONATE 8.4 % IV SOLN
INTRAVENOUS | Status: DC
  Administered 2018-04-19: 13:00:00 via INTRAVENOUS
  Filled 2018-04-19 (×3): qty 150

## 2018-04-19 MED ORDER — PRISMASOL BGK 4/2.5 32-4-2.5 MEQ/L IV SOLN
INTRAVENOUS | Status: DC
  Administered 2018-04-19 – 2018-04-20 (×2): via INTRAVENOUS_CENTRAL
  Filled 2018-04-19 (×3): qty 5000

## 2018-04-19 MED ORDER — SODIUM ZIRCONIUM CYCLOSILICATE 5 G PO PACK
5.0000 g | PACK | Freq: Once | ORAL | Status: AC
  Administered 2018-04-19: 5 g via ORAL
  Filled 2018-04-19: qty 1

## 2018-04-19 MED ORDER — ALBUTEROL SULFATE (2.5 MG/3ML) 0.083% IN NEBU
10.0000 mg | INHALATION_SOLUTION | Freq: Once | RESPIRATORY_TRACT | Status: AC
  Administered 2018-04-19: 10 mg via RESPIRATORY_TRACT
  Filled 2018-04-19: qty 12

## 2018-04-19 MED ORDER — METOPROLOL TARTRATE 5 MG/5ML IV SOLN
INTRAVENOUS | Status: AC
  Administered 2018-04-19: 2.5 mg via INTRAVENOUS
  Filled 2018-04-19: qty 5

## 2018-04-19 NOTE — Procedures (Signed)
Paracentesis Procedure Note  Pre-operative Diagnosis: neuroendocrine tumor, left sided recurrent pleural effusion, recurrent ascites  Post-operative Diagnosis: same  Indications: therapeutic relief of large volume ascites in patient with tachypnea and increased work of breathing.   Procedure Details  Consent: Informed consent was obtained. Discussed procedure in detail with patients and his daughters. Risks of the procedure were discussed including: infection, bleeding, and pain.  RLQ was evaluated with ultrasound - ascites noted and good window visualized for introduction of needle. Under sterile conditions the patient was positioned. Chlorrhexidine solution and sterile drapes were utilized.  1% plain lidocaine was used to anesthetize right lower quadrant. Fluid was obtained without any difficulties and minimal blood loss.  A dressing was applied to the wound and wound care instructions were provided.   Findings 4500 ml of cloudy pleural fluid was obtained. A sample was sent to Pathology for cytogenetics, flow, and cell counts, as well as for infection analysis.  Medications post procedure 25% albumin - 47MR  Complications:  None; patient tolerated the procedure well.          Condition: stable  Plan Bed Rest for continuous.  Attending Attestation: I performed the procedure.  Dr. Seward Carol [facility] Critical [facility] Medicine

## 2018-04-19 NOTE — Consult Note (Signed)
[facility] Nurse wound consult note Patient receiving care at [facility].  No family present. Reason for Consult: wounds on buttocks Wound type: Right ischial tuberosity has a DTPI that measures 1 cm x 1 cm, is purple, and overlying tissue is intact.  Across the sacrum are 7 distinct areas that appear as partial thickness abrasions. Pressure Injury POA: Yes Dressing procedure/placement/frequency: Continue sacral foam dressing use (one in place today), and turn patient to prevent further pressure to right ischial tuberosity.  Bilateral prevalon boots have also been ordered. Monitor the wound area(s) for worsening of condition such as: Signs/symptoms of infection,  Increase in size,  Development of or worsening of odor, Development of pain, or increased pain at the affected locations.  Notify the medical team if any of these develop.  Thank you for the consult.  Discussed plan of care with the patient and bedside nurse.  [facility] nurse will not follow at this time.  Please re-consult the [facility] team if needed.  Val Riles, RN, MSN, CWOCN, CNS-BC, pager [phone]

## 2018-04-19 NOTE — Progress Notes (Signed)
CRITICAL VALUE ALERT  Critical Value:  k 6.5  Date & Time Notied:  04/19/18  0840  Provider Notified: Maudie Mercury  Orders Received/Actions taken: see new orders

## 2018-04-19 NOTE — Progress Notes (Signed)
ANTICOAGULATION CONSULT NOTE - Initial Consult  Pharmacy Consult for Warfarin  Indication: MVR  Allergies  Allergen Reactions  . Penicillins Other (See Comments)    DID THE REACTION INVOLVE: Swelling of the face/tongue/throat, SOB, or low BP? Unknown Sudden or severe rash/hives, skin peeling, or the inside of the mouth or nose? Unknown Did it require medical treatment? Yes When did it last happen?When pt was 73 years old Pt knows that his parents had to take him to the ER at the hospital and has not been retested with any PCN or cephalosporin that he is aware of.   Patient Measurements: Height: 5\' 10"  (177.8 cm) Weight: 192 lb 7.4 oz (87.3 kg) IBW/kg (Calculated) : 73  Vital Signs: Temp: 97.4 F (36.3 C) (02/03 2125) Temp Source: Oral (02/03 2125) BP: 95/64 (02/03 2300) Pulse Rate: 94 (02/03 2125)  Labs: Recent Labs    04/29/2018 1850 05/10/2018 2341  HGB 10.2*  --   HCT 31.7*  --   PLT 486*  --   LABPROT 54.8*  --   INR 6.33*  --   CREATININE 4.43* 4.54*    Estimated Creatinine Clearance: 14.7 mL/min (A) (by C-G formula based on SCr of 4.54 mg/dL (H)).   Medical History: Past Medical History:  Diagnosis Date  . Anasarca 12/01/2017  . Atrial fibrillation ([place]) 07/12/2013   Perioperative, 2001   . CAD (coronary artery disease) of artery bypass graft 07/12/2013   Saphenous vein graft 2001   . Chronic anticoagulation 07/12/2013  . Essential hypertension 07/12/2013  . History of mitral valve replacement with mechanical valve 01/11/2013   Mechanical mitral valve 2001  Bacterial endocarditis as the cause   . HOH (hard of hearing)   . Prostate cancer ([place]) 07/12/2013  . Renal insufficiency    Assessment: 73 y/o M on warfarin for mechanical mitral valve. Here with fall/dyspnea. CT head negative. INR is elevated at 6.33.   Goal of Therapy:  INR 2.5-3.5 Monitor platelets by anticoagulation protocol: Yes   Plan:  -Hold warfarin for now -Re-start warfarin as INR  allows  Narda Bonds 04/19/2018,12:25 AM

## 2018-04-19 NOTE — Procedures (Signed)
Central Venous Catheter Insertion Procedure Note Mario Powell [id number] 1945/07/03  Procedure: Insertion of Central Venous Catheter Indications: Assessment of intravascular volume, Drug and/or fluid administration, Frequent blood sampling and renal replacement therapy  Procedure Details Consent: Risks of procedure as well as the alternatives and risks of each were explained to the (patient/caregiver).  Consent for procedure obtained. Time Out: Verified patient identification, verified procedure, site/side was marked, verified correct patient position, special equipment/implants available, medications/allergies/relevent history reviewed, required imaging and test results available.  Performed  Maximum sterile technique was used including antiseptics, cap, gloves, gown, hand hygiene, mask and sheet. Skin prep: Chlorhexidine; local anesthetic administered A antimicrobial bonded/coated triple lumen catheter was placed in the right internal jugular vein using the Seldinger technique.  Evaluation Blood flow good Complications: No apparent complications Patient did tolerate procedure well. Chest X-ray ordered to verify placement.  CXR: normal.  Kristen D Scatliffe 04/19/2018, 10:50 PM

## 2018-04-19 NOTE — Consult Note (Signed)
[facility]  HISTORY AND PHYSICAL  Mario Powell is an 73 y.o. male.    Chief Complaint: abnormal labs, fall, ascites  HPI: Pt is a 61 M with a history of Afib, CAD s/p CABG 2001, NE tumor s/p resection, chronic lymphedema since that time and a fairly subacute presentation of chylous ascites who is now seen in consultation at the request of Dr. Nori Riis for evaluation and recommendations surrounding subacute kidney injury and multiple metabolic derangements.    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  Extensive workup has been performed with really no etiology identified.  Lymphatic obstruction, cirrhosis, R-sided CHF, hypoalbuminemia, prior pelvic radiation for prostate cancer, and malignancy have all been entertained.  He has had to have increasing frequency of paracentesis with IR.    He presented with a fall yesterday and was found to have sig metabolic derangements- coagulopathy, hyponatremia, hyperkalemia, and worsening kidney function with Cr up to 4.7.  In this setting we are asked to see.  Pt reports some SOB. Denies n/v, f/c, CP.  No blood in stool.   PMH: Past Medical History:  Diagnosis Date  . Anasarca 12/01/2017  . Atrial fibrillation ([place]) 07/12/2013   Perioperative, 2001   . CAD (coronary artery disease) of artery bypass graft 07/12/2013   Saphenous vein graft 2001   . Chronic anticoagulation 07/12/2013  . Essential hypertension 07/12/2013  . History of mitral valve replacement with mechanical valve 01/11/2013   Mechanical mitral valve 2001  Bacterial endocarditis as the cause   . HOH (hard of hearing)   . Prostate cancer ([place]) 07/12/2013  . Renal insufficiency    PSH: Past Surgical History:  Procedure Laterality Date  . BIOPSY  12/06/2017   Procedure: BIOPSY;  Surgeon: Otis Brace, MD;  Location: [place];  Service: Gastroenterology;;  . BIOPSY  03/04/2018   Procedure: BIOPSY;  Surgeon: Otis Brace, MD;  Location:  [facility];  Service: Gastroenterology;;  . COLONOSCOPY WITH PROPOFOL N/A 12/06/2017   Procedure: COLONOSCOPY WITH PROPOFOL ;  Surgeon: Otis Brace, MD;  Location: [place];  Service: Gastroenterology;  Laterality: N/A;  . CORONARY ARTERY BYPASS GRAFT     2001  . ESOPHAGOGASTRODUODENOSCOPY (EGD) WITH PROPOFOL N/A 12/06/2017   Procedure: ESOPHAGOGASTRODUODENOSCOPY (EGD) WITH PROPOFOL;  Surgeon: Otis Brace, MD;  Location: [facility];  Service: Gastroenterology;  Laterality: N/A;  . ESOPHAGOGASTRODUODENOSCOPY (EGD) WITH PROPOFOL N/A 03/04/2018   Procedure: ESOPHAGOGASTRODUODENOSCOPY (EGD) WITH PROPOFOL;  Surgeon: Otis Brace, MD;  Location: [facility];  Service: Gastroenterology;  Laterality: N/A;  . ESOPHAGOGASTRODUODENOSCOPY (EGD) WITH PROPOFOL N/A 03/15/2018   Procedure: ESOPHAGOGASTRODUODENOSCOPY (EGD) WITH PROPOFOL;  Surgeon: Ronnette Juniper, MD;  Location: [facility];  Service: Gastroenterology;  Laterality: N/A;  . IR PARACENTESIS  12/03/2017  . IR PARACENTESIS  01/04/2018  . IR PARACENTESIS  02/04/2018  . IR PARACENTESIS  04/07/2018  . IR RADIOLOGIST EVAL & MGMT  03/29/2018  . IR THORACENTESIS ASP PLEURAL SPACE W/IMG GUIDE  03/30/2018  . MITRAL VALVE REPLACEMENT  2001   Mechanical prosthesis  . POLYPECTOMY  12/06/2017   Procedure: POLYPECTOMY;  Surgeon: Otis Brace, MD;  Location: [facility];  Service: Gastroenterology;;  . POLYPECTOMY  03/04/2018   Procedure: POLYPECTOMY;  Surgeon: Otis Brace, MD;  Location: [facility];  Service: Gastroenterology;;    Past Medical History:  Diagnosis Date  . Anasarca 12/01/2017  . Atrial fibrillation ([place]) 07/12/2013   Perioperative, 2001   . CAD (coronary artery disease) of artery bypass graft 07/12/2013  Saphenous vein graft 2001   . Chronic anticoagulation 07/12/2013  . Essential hypertension 07/12/2013  . History of mitral valve replacement with mechanical valve 01/11/2013   Mechanical mitral valve 2001   Bacterial endocarditis as the cause   . HOH (hard of hearing)   . Prostate cancer ([place]) 07/12/2013  . Renal insufficiency     Medications:   Scheduled: . calcium-vitamin D  1 tablet Oral Daily  . cholecalciferol  1,000 Units Oral Daily  . medium chain triglycerides  15 mL Oral QHS  . pantoprazole  40 mg Oral BID  . phytonadione  10 mg Oral Daily  . sodium bicarbonate  1,300 mg Oral TID  . sodium zirconium cyclosilicate  10 g Oral Daily    Facility-Administered Medications Prior to Admission  Medication Dose Route Frequency Provider Last Rate Last Dose  . albumin human 25 % solution 50 g  50 g Intravenous PRN Martyn Malay, MD       Medications Prior to Admission  Medication Sig Dispense Refill  . Amino Acids-Protein Hydrolys (FEEDING SUPPLEMENT, PRO-STAT SUGAR FREE 64,) LIQD Take 30 mLs by mouth 3 (three) times daily with meals. (Patient taking differently: Take 30 mLs by mouth every evening. ) 900 mL 0  . calcium-vitamin D (OSCAL WITH D) 500-200 MG-UNIT tablet Take 1 tablet by mouth daily.     . Camphor-Eucalyptus-Menthol (VICKS VAPORUB EX) Apply 1 application topically See admin instructions. Apply under nose daily in the evening    . Cholecalciferol (VITAMIN D-3) 25 MCG (1000 UT) CAPS Take 1,000 Units by mouth daily.    Marland Kitchen ezetimibe (ZETIA) 10 MG tablet Take 1 tablet (10 mg total) by mouth daily. PLEASE CALL TO SCHEDULE F/U APPT FOR FUTURE REFILLS.Marland Kitchen3RD AND FINAL ATTEMPT! 15 tablet 0  . medium chain triglycerides (MCT OIL) oil Take 15 mLs by mouth 3 (three) times daily with meals. (Patient taking differently: Take 15 mLs by mouth at bedtime. ) 946 mL 12  . Multiple Vitamin (MULTIVITAMIN WITH MINERALS) TABS tablet Take 1 tablet by mouth daily. 90 tablet 3  . Naphazoline-Pheniramine (OPCON-A) 0.027-0.315 % SOLN Place 2-3 drops into both eyes daily as needed (for dry eyes).    . pantoprazole (PROTONIX) 40 MG tablet Take 1 tablet (40 mg total) by mouth 2 (two) times daily. 90 tablet 3   . rosuvastatin (CRESTOR) 40 MG tablet Take 1 tablet (40 mg total) by mouth daily. PLEASE CALL TO SCHEDULE F/U APPT FOR FUTURE REFILLS.Marland Kitchen3RD AND FINAL ATTEMPT! 15 tablet 0  . warfarin (COUMADIN) 1 MG tablet TAKE AS DIRECTED BY ANTICOAGULATION CLINIC (Patient taking differently: Take 0.5-1 mg by mouth See admin instructions. Take 0.5 mg by mouth daily on Monday, Wednesday. Take 1 mg by mouth daily on all other days.) 120 tablet 0    ALLERGIES:   Allergies  Allergen Reactions  . Penicillins Other (See Comments)    DID THE REACTION INVOLVE: Swelling of the face/tongue/throat, SOB, or low BP? Unknown Sudden or severe rash/hives, skin peeling, or the inside of the mouth or nose? Unknown Did it require medical treatment? Yes When did it last happen?When pt was 73 years old Pt knows that his parents had to take him to the ER at the hospital and has not been retested with any PCN or cephalosporin that he is aware of.    FAM HX: Family History  Problem Relation Age of Onset  . Heart attack Father   . Healthy Mother   . Healthy Sister   .  Healthy Sister     Social History:   reports that he has quit smoking. He has never used smokeless tobacco. He reports previous alcohol use. He reports that he does not use drugs.  ROS: ROS: all other systems reviewed and are negative except as per HPI  Blood pressure (!) 96/58, pulse 91, temperature (!) 97.5 F (36.4 C), temperature source Oral, resp. rate (!) 30, height 5\' 11"  (1.803 m), weight 84.3 kg, SpO2 100 %. PHYSICAL EXAM: Physical Exam  GEN frail and ill-appearing HEENT EOMI PERRL wearing glasses, scrape on side of nose, temporal wasting NECK + JVD to angle of the mandible PULM muffled L > R CV tachycardic, + S3 ABD distended, + umbilical hernia, + fluid wave EXT 3+ pitting LE edema NEURO AAO x 3 no asterixis SKIN: mult ecchymoses, some mild jaundice   Results for orders placed or performed during the hospital encounter of 05/10/2018 (from  the past 48 hour(s))  Comprehensive metabolic panel     Status: Abnormal   Collection Time: 04/19/2018  6:50 PM  Result Value Ref Range   Sodium 119 (LL) 135 - 145 mmol/L    Comment: CRITICAL RESULT CALLED TO, READ BACK BY AND VERIFIED WITH: S BAILEY,RN 2233 05/01/2018 WBOND    Potassium 6.9 (HH) 3.5 - 5.1 mmol/L    Comment: NO VISIBLE HEMOLYSIS CRITICAL RESULT CALLED TO, READ BACK BY AND VERIFIED WITH: S BAILEY,RN 1950 05/04/2018 WBOND    Chloride 89 (L) 98 - 111 mmol/L   CO2 17 (L) 22 - 32 mmol/L   Glucose, Bld 106 (H) 70 - 99 mg/dL   BUN 80 (H) 8 - 23 mg/dL   Creatinine, Ser 4.43 (H) 0.61 - 1.24 mg/dL   Calcium 8.1 (L) 8.9 - 10.3 mg/dL   Total Protein 5.2 (L) 6.5 - 8.1 g/dL   Albumin 1.7 (L) 3.5 - 5.0 g/dL   AST 99 (H) 15 - 41 U/L   ALT 88 (H) 0 - 44 U/L   Alkaline Phosphatase 289 (H) 38 - 126 U/L   Total Bilirubin 0.9 0.3 - 1.2 mg/dL   GFR calc non Af Amer 12 (L) >60 mL/min   GFR calc Af Amer 14 (L) >60 mL/min   Anion gap 13 5 - 15    Comment: Performed at [facility], [address] [place], [place] [address]  CBC with Differential     Status: Abnormal   Collection Time: 05/01/2018  6:50 PM  Result Value Ref Range   WBC 15.0 (H) 4.0 - 10.5 K/uL   RBC 3.57 (L) 4.22 - 5.81 MIL/uL   Hemoglobin 10.2 (L) 13.0 - 17.0 g/dL   HCT 31.7 (L) 39.0 - 52.0 %   MCV 88.8 80.0 - 100.0 fL   MCH 28.6 26.0 - 34.0 pg   MCHC 32.2 30.0 - 36.0 g/dL   RDW 16.1 (H) 11.5 - 15.5 %   Platelets 486 (H) 150 - 400 K/uL   nRBC 0.0 0.0 - 0.2 %   Neutrophils Relative % 91 %   Neutro Abs 13.7 (H) 1.7 - 7.7 K/uL   Lymphocytes Relative 1 %   Lymphs Abs 0.1 (L) 0.7 - 4.0 K/uL   Monocytes Relative 7 %   Monocytes Absolute 1.0 0.1 - 1.0 K/uL   Eosinophils Relative 0 %   Eosinophils Absolute 0.0 0.0 - 0.5 K/uL   Basophils Relative 0 %   Basophils Absolute 0.0 0.0 - 0.1 K/uL   Immature Granulocytes 1 %  Abs Immature Granulocytes 0.12 (H) 0.00 - 0.07 K/uL    Comment: Performed at [facility], [place] [address], [place], [place] [address]  Protime-INR     Status: Abnormal   Collection Time: 04/24/2018  6:50 PM  Result Value Ref Range   Prothrombin Time 54.8 (H) 11.4 - 15.2 seconds    Comment: REPEATED TO VERIFY   INR 6.33 (HH)     Comment: REPEATED TO VERIFY CRITICAL RESULT CALLED TO, READ BACK BY AND VERIFIED WITH: Melida Gimenez [id number] 04/19/2018 D BRADLEY Performed at [facility] [address], [place], [place] [address]   Basic metabolic panel     Status: Abnormal   Collection Time: 04/28/2018 11:41 PM  Result Value Ref Range   Sodium 118 (LL) 135 - 145 mmol/L    Comment: CRITICAL RESULT CALLED TO, READ BACK BY AND VERIFIED WITH: STARKEY N,RN 04/19/18 0022 WAYK    Potassium 6.8 (HH) 3.5 - 5.1 mmol/L    Comment: NO VISIBLE HEMOLYSIS CRITICAL RESULT CALLED TO, READ BACK BY AND VERIFIED WITH: STARKEY N,RN 04/19/18 0022 WAYK    Chloride 90 (L) 98 - 111 mmol/L   CO2 17 (L) 22 - 32 mmol/L   Glucose, Bld 99 70 - 99 mg/dL   BUN 79 (H) 8 - 23 mg/dL   Creatinine, Ser 4.54 (H) 0.61 - 1.24 mg/dL   Calcium 7.9 (L) 8.9 - 10.3 mg/dL   GFR calc non Af Amer 12 (L) >60 mL/min   GFR calc Af Amer 14 (L) >60 mL/min   Anion gap 11 5 - 15    Comment: Performed at [facility], [place] [address], [place], [place] [address]  Vitamin B12     Status: None   Collection Time: 04/17/2018 11:41 PM  Result Value Ref Range   Vitamin B-12 357 180 - 914 pg/mL    Comment: (NOTE) This assay is not validated for testing neonatal or myeloproliferative syndrome specimens for Vitamin B12 levels. Performed at [facility], [place] [address] [place], [place] [address]   Comprehensive metabolic panel     Status: Abnormal   Collection Time: 04/19/18  2:52 AM  Result Value Ref Range   Sodium 119 (LL) 135 - 145 mmol/L    Comment: CRITICAL RESULT CALLED TO, READ BACK BY AND VERIFIED WITH: ODELL J,RN 04/19/18 0418 WAYK    Potassium 6.8 (HH) 3.5 - 5.1 mmol/L    Comment: CRITICAL RESULT  CALLED TO, READ BACK BY AND VERIFIED WITH: ODELL J,RN 04/19/18 0418 WAYK    Chloride 92 (L) 98 - 111 mmol/L   CO2 21 (L) 22 - 32 mmol/L   Glucose, Bld 94 70 - 99 mg/dL   BUN 88 (H) 8 - 23 mg/dL   Creatinine, Ser 4.75 (H) 0.61 - 1.24 mg/dL   Calcium 8.0 (L) 8.9 - 10.3 mg/dL   Total Protein 4.7 (L) 6.5 - 8.1 g/dL   Albumin 1.4 (L) 3.5 - 5.0 g/dL   AST 87 (H) 15 - 41 U/L   ALT 77 (H) 0 - 44 U/L   Alkaline Phosphatase 269 (H) 38 - 126 U/L   Total Bilirubin 1.2 0.3 - 1.2 mg/dL   GFR calc non Af Amer 11 (L) >60 mL/min   GFR calc Af Amer 13 (L) >60 mL/min   Anion gap 6 5 - 15    Comment: Performed at [facility], [address] [place], [place] [address]  CBC  Status: Abnormal   Collection Time: 04/19/18  2:52 AM  Result Value Ref Range   WBC 10.6 (H) 4.0 - 10.5 K/uL   RBC 3.34 (L) 4.22 - 5.81 MIL/uL   Hemoglobin 9.2 (L) 13.0 - 17.0 g/dL   HCT 29.2 (L) 39.0 - 52.0 %   MCV 87.4 80.0 - 100.0 fL   MCH 27.5 26.0 - 34.0 pg   MCHC 31.5 30.0 - 36.0 g/dL   RDW 16.0 (H) 11.5 - 15.5 %   Platelets 401 (H) 150 - 400 K/uL   nRBC 0.0 0.0 - 0.2 %    Comment: Performed at [facility] [address], [place], [place] [address]  Protime-INR     Status: Abnormal   Collection Time: 04/19/18  2:52 AM  Result Value Ref Range   Prothrombin Time 57.4 (H) 11.4 - 15.2 seconds    Comment: REPEATED TO VERIFY   INR 6.73 (HH)     Comment: REPEATED TO VERIFY CRITICAL RESULT CALLED TO, READ BACK BY AND VERIFIED WITH: Letta Pate 0422 04/19/2018 Mena Goes Performed at [facility], [place] [address], [place], [place] [address]   Magnesium     Status: Abnormal   Collection Time: 04/19/18  2:52 AM  Result Value Ref Range   Magnesium 2.6 (H) 1.7 - 2.4 mg/dL    Comment: Performed at [facility] [address] [place], [place] [address]  Potassium     Status: Abnormal   Collection Time: 04/19/18  7:38 AM  Result Value Ref Range   Potassium 6.5 (HH) 3.5 - 5.1 mmol/L     Comment: CRITICAL RESULT CALLED TO, READ BACK BY AND VERIFIED WITHEverlean Alstrom RN @ [phone] 04/19/18 LEONARD,A Performed at [facility], [place] [address] [place], [place] [address]   Blood gas, arterial     Status: Abnormal   Collection Time: 04/19/18  9:00 AM  Result Value Ref Range   O2 Content 5.0 L/min   Delivery systems NASAL CANNULA    pH, Arterial 7.412 7.350 - 7.450   pCO2 arterial 28.9 (L) 32.0 - 48.0 mmHg   pO2, Arterial 88.4 83.0 - 108.0 mmHg   Bicarbonate 18.1 (L) 20.0 - 28.0 mmol/L   Acid-base deficit 5.7 (H) 0.0 - 2.0 mmol/L   O2 Saturation 97.3 %   Patient temperature 98.6    Collection site RIGHT RADIAL    Drawn by [id number]    Sample type ARTERIAL DRAW    Allens test (pass/fail) PASS PASS  Sodium, urine, random     Status: None   Collection Time: 04/19/18 10:06 AM  Result Value Ref Range   Sodium, Ur <10 mmol/L    Comment: REPEATED TO VERIFY Performed at [facility], [address], [place], [place] [address]   Osmolality, urine     Status: None   Collection Time: 04/19/18 10:06 AM  Result Value Ref Range   Osmolality, Ur 352 300 - 900 mOsm/kg    Comment: Performed at [facility] [address], [place],  [address]  MRSA PCR Screening     Status: None   Collection Time: 04/19/18 10:46 AM  Result Value Ref Range   MRSA by PCR NEGATIVE NEGATIVE    Comment:        The GeneXpert MRSA Assay (FDA approved for NASAL specimens only), is one component of a comprehensive MRSA colonization surveillance program. It is not intended to diagnose MRSA infection nor to guide or  monitor treatment for MRSA infections. Performed at [facility], [place] [address] [place], [place] [address]   Glucose, capillary     Status: None   Collection Time: 04/19/18 10:54 AM  Result Value Ref Range   Glucose-Capillary 80 70 - 99 mg/dL  Na and K (sodium & potassium), rand urine     Status: None   Collection Time: 04/19/18 11:04 AM  Result Value Ref Range    Sodium, Ur <10 mmol/L    Comment: REPEATED TO VERIFY   Potassium Urine 46 mmol/L    Comment: Performed at [facility] [address] [place], [place] [address]  Cortisol-am, blood     Status: Abnormal   Collection Time: 04/19/18 12:02 PM  Result Value Ref Range   Cortisol - AM 33.9 (H) 6.7 - 22.6 ug/dL    Comment: Performed at [facility] [address], [place], [place] [address]  Osmolality     Status: None   Collection Time: 04/19/18 12:02 PM  Result Value Ref Range   Osmolality 279 275 - 295 mOsm/kg    Comment: Performed at [facility], [place] [address] [place], [place] [address]  TSH     Status: Abnormal   Collection Time: 04/19/18 12:02 PM  Result Value Ref Range   TSH 9.090 (H) 0.350 - 4.500 uIU/mL    Comment: Performed by a 3rd Generation assay with a functional sensitivity of <=0.01 uIU/mL. Performed at [facility], [place] [address], [place], [place] [address]   Basic metabolic panel     Status: Abnormal   Collection Time: 04/19/18 12:02 PM  Result Value Ref Range   Sodium 120 (L) 135 - 145 mmol/L   Potassium 6.4 (HH) 3.5 - 5.1 mmol/L    Comment: CRITICAL RESULT CALLED TO, READ BACK BY AND VERIFIED WITH: Smiley Houseman RN@ 1253 04/19/18 LEONARD,A    Chloride 91 (L) 98 - 111 mmol/L   CO2 16 (L) 22 - 32 mmol/L   Glucose, Bld 104 (H) 70 - 99 mg/dL   BUN 84 (H) 8 - 23 mg/dL   Creatinine, Ser 4.78 (H) 0.61 - 1.24 mg/dL   Calcium 8.2 (L) 8.9 - 10.3 mg/dL   GFR calc non Af Amer 11 (L) >60 mL/min   GFR calc Af Amer 13 (L) >60 mL/min   Anion gap 13 5 - 15    Comment: Performed at [facility] [address] [place], [place] [address]  Ammonia     Status: None   Collection Time: 04/19/18 12:02 PM  Result Value Ref Range   Ammonia 26 9 - 35 umol/L    Comment: Performed at [facility], [place] [address], [place], [place] [address]  Prepare fresh frozen plasma     Status: None (Preliminary result)   Collection Time: 04/19/18  2:36 PM  Result  Value Ref Range   Unit Number [id number]    Blood Component Type THW PLS APHR    Unit division B0    Status of Unit ALLOCATED    Transfusion Status OK TO TRANSFUSE    Unit Number [id number]    Blood Component Type THW PLS APHR    Unit division B0    Status of Unit ISSUED    Transfusion Status      OK TO TRANSFUSE Performed at [facility], [place] [address] [place], [place] [address]    Unit Number [id number]    Blood  Component Type THW PLS APHR    Unit division A0    Status of Unit ALLOCATED    Transfusion Status OK TO TRANSFUSE    Unit Number [id number]    Blood Component Type THW PLS APHR    Unit division B0    Status of Unit ALLOCATED    Transfusion Status OK TO TRANSFUSE     Dg Chest 2 View  Result Date: 04/22/2018 CLINICAL DATA:  Shortness of breath and hypoxia. Fell yesterday. EXAM: CHEST - 2 VIEW COMPARISON:  03/30/2018. FINDINGS: Interval large left pleural effusion and mild left lung atelectasis. Small right pleural effusion and small amount of right basilar atelectasis. 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 No pneumothorax. Unremarkable bones. IMPRESSION: 1. Interval large left pleural effusion and mild left lung atelectasis. 2. Small right pleural effusion. 3. Mild right basilar atelectasis. Electronically Signed   By: Claudie Revering M.D.   On: 05/01/2018 18:21   Ct Head Wo Contrast  Result Date: 05/12/2018 CLINICAL DATA:  Fall yesterday.  Trauma to back of head. EXAM: CT HEAD WITHOUT CONTRAST CT CERVICAL SPINE WITHOUT CONTRAST TECHNIQUE: Multidetector CT imaging of the head and cervical spine was performed following the standard protocol without intravenous contrast. Multiplanar CT image reconstructions of the cervical spine were also generated. COMPARISON:  None. FINDINGS: CT HEAD FINDINGS Brain: Mild atrophy and white matter changes are stable. No acute infarct, hemorrhage, or mass lesion is present. The ventricles are of proportionate to the  degree of atrophy. No significant extraaxial fluid collection is present. The brainstem and cerebellum are within normal limits. Vascular: Atherosclerotic calcifications present in the cavernous internal carotid arteries bilaterally and at the dural margin of both vertebral arteries. No hyperdense vessel. Skull: Calvarium is intact. No focal lytic or blastic lesions are present. Sinuses/Orbits: The paranasal sinuses and mastoid air cells are clear. The globes and orbits are within normal limits. CT CERVICAL SPINE FINDINGS Alignment: Grade 1 degenerative anterolisthesis is present at C4-5. AP alignment is otherwise anatomic. Skull base and vertebrae: Craniocervical junction is normal. Acute or healing fractures are present. Soft tissues and spinal canal: Atherosclerotic calcifications are present at the carotid bifurcations bilaterally. High-grade stenosis is suspected on the right. No focal mass lesion is present. Thyroid is normal. No significant adenopathy is present. Disc levels: Osseous foraminal narrowing is present on the left at C4-5 due to uncovertebral and facet disease. 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 Upper chest: A left pleural effusion is noted. IMPRESSION: 1. No acute trauma to the head or cervical spine. 2. Normal CT appearance of the brain for age. 3. Degenerative changes of the cervical spine are most evident at C4-5 on the left. 4. 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 5. Left pleural effusion. Electronically Signed   By: San Morelle M.D.   On: 05/03/2018 17:42   Ct Cervical Spine Wo Contrast  Result Date: 04/27/2018 CLINICAL DATA:  Fall yesterday.  Trauma to back of head. EXAM: CT HEAD WITHOUT CONTRAST CT CERVICAL SPINE WITHOUT CONTRAST TECHNIQUE: Multidetector CT imaging of the head and cervical spine was performed following the standard protocol without intravenous contrast. Multiplanar CT image reconstructions of the cervical spine were also generated. COMPARISON:   None. FINDINGS: CT HEAD FINDINGS Brain: Mild atrophy and white matter changes are stable. No acute infarct, hemorrhage, or mass lesion is present. The ventricles are of proportionate to the degree of atrophy. No significant extraaxial fluid collection is present. The brainstem and cerebellum are within normal  limits. Vascular: Atherosclerotic calcifications present in the cavernous internal carotid arteries bilaterally and at the dural margin of both vertebral arteries. No hyperdense vessel. Skull: Calvarium is intact. No focal lytic or blastic lesions are present. Sinuses/Orbits: The paranasal sinuses and mastoid air cells are clear. The globes and orbits are within normal limits. CT CERVICAL SPINE FINDINGS Alignment: Grade 1 degenerative anterolisthesis is present at C4-5. AP alignment is otherwise anatomic. Skull base and vertebrae: Craniocervical junction is normal. Acute or healing fractures are present. Soft tissues and spinal canal: Atherosclerotic calcifications are present at the carotid bifurcations bilaterally. High-grade stenosis is suspected on the right. No focal mass lesion is present. Thyroid is normal. No significant adenopathy is present. Disc levels: Osseous foraminal narrowing is present on the left at C4-5 due to uncovertebral and facet disease. 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 Upper chest: A left pleural effusion is noted. IMPRESSION: 1. No acute trauma to the head or cervical spine. 2. Normal CT appearance of the brain for age. 3. Degenerative changes of the cervical spine are most evident at C4-5 on the left. 4. 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 5. Left pleural effusion. Electronically Signed   By: San Morelle M.D.   On: 04/20/2018 17:42   Dg Hand Complete Left  Result Date: 05/13/2018 CLINICAL DATA:  Left hand pain and swelling following a fall yesterday. EXAM: LEFT HAND - COMPLETE 3+ VIEW COMPARISON:  None. FINDINGS: Portions of the index finger obscured by a  pulse oximeter. Screw and plate fixation of an old, healed distal radius fracture. No acute fracture or dislocation is seen. Diffuse arterial calcifications are noted. Mild diffuse soft tissue swelling. IMPRESSION: No acute fracture or dislocation. Electronically Signed   By: Claudie Revering M.D.   On: 04/25/2018 18:23   Dg Foot Complete Left  Result Date: 05/03/2018 CLINICAL DATA:  Left foot pain and swelling following a fall yesterday. EXAM: LEFT FOOT - COMPLETE 3+ VIEW COMPARISON:  None. FINDINGS: Marked diffuse soft tissue swelling. Atheromatous arterial calcifications. No fracture or dislocation. Mild to moderate inferior calcaneal spur formation. IMPRESSION: 1. No fracture or dislocation. 2. Marked diffuse soft tissue swelling. Electronically Signed   By: Claudie Revering M.D.   On: 05/12/2018 18:22    Assessment/Plan  1. Progressive renal injury: given ascites, increasing frequency of paractenesis, borderline low Bps, this really looks like hepatorenal syndrome based on physiologic picture.  The etiology of ascites is yet unknown.  Nephrogenic ascites is usually not chylous.  Will do UP/C.  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  Will offer trial of likely CRRT if UOP and labs look no better after medical therapies.  He's getting colloid in the form of FFP so dont' really need to do albumin challenge.  Not sure midodrine/octreotide would really change necessity for dialysis but could see if he tolerates midodrine and then decide whether or not to do octreotide.  His K/L light chain ratio is fairly elevated on UPEP but without M-spike, does he have amyloid?   2.  Hyponatremia and hyperkalemia: TSH a little high, rec levothyroxine.  AM cortisol OK.  Medical management for now but likely progressing to needing dialysis  3.  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  Please note that LVPs can exacerbate  renal injury- getting FFP, consider albumin  4.  Dispo; pending workup Madelon Lips 04/19/2018, 3:42 PM

## 2018-04-19 NOTE — Progress Notes (Signed)
[facility] Progress Note Patient Name: Mario Powell DOB: 1945-06-13 MRN: [id number]   Date of Service  04/19/2018  HPI/Events of Note  Ascites with respiratory compromise. Hyperkalemia that did not significantly respond to protocol Rx.  eICU Interventions  I've asked Dr. Gilford Raid to evaluate for bedside paracentesis and also to place a line for CRRT. The plan was discussed with Dr. Posey Pronto, nephrology on call, who will put in CRRT orders.        Frederik Pear 04/19/2018, 8:34 PM

## 2018-04-19 NOTE — Consult Note (Signed)
NAME:  Mario Powell, MRN:  [id number], DOB:  1945/05/01, LOS: 1 ADMISSION DATE:  04/26/2018, CONSULTATION DATE:  2/4 REFERRING MD:  Rumball, CHIEF COMPLAINT:  Dyspnea and metabolic acidosis    Brief History   73 year old male patient admitted on 2/3 after falling getting out of his vehicle.  He has a history of chronic chylous ascites (of uncertain etiology )as well as recurrent left pleural effusion which is also chronic.  Pulmonary asked to evaluate on 2/4 due to worsening shortness of breath, non-anion gap metabolic acidosis, and several severe metabolic derangements, with concern about clinical decline.   History of present illness   73 year old male patient with history as mentioned below, was admitted to the hospital on 2/3 after he fell getting out of his jeep.  The fall was not witnessed, he did not hit his head.  He has chronic shortness of breath related to recurrent chylous ascites requiring more frequent paracentesis, last paracentesis was on 1/23, he is requiring repeat drainage about every 5 to 7 days now.  He has been largely immobile since his last hospital admission/discharge, requires walker when he ambulates.  Family also noting worsening disorientation.On arrival to the emergency room he was found to be very short of breath.  More so than baseline.  He is only able to speak in 1-2 word phrases.  History is hard to obtain due to both element of delirium as well as the fact that he is very hard of hearing.  He denied any new cough, does report some epistaxis, he does endorse shortness of breath.  Also difficulty swallowing.  Apparently his shortness of breath is been slowly progressive over the last week.  In addition to his dyspnea he was found to have several metabolic derangements including: Acute on chronic hyponatremia, severe hyperkalemia at 6.9, worsening acute on chronic renal failure, and non-anion gap metabolic acidosis.  He was also seen found to be extremely hypercoagulable  with his current INR at 6.33.  He was admitted to the internal medicine service, treatment thus far has included: Supplemental oxygen, treatment of hyperkalemia including calcium gluconate, bicarbonate, and Lokelma.  His coagulopathy has been treated with IV vitamin K.  Pulmonary was asked to see on 2/4 due to marked increased work of breathing in addition to metabolic derangements.  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  Significant Hospital Events   Admitted 1/3 following a fall.  He is significant only short of breath talking in 1-2 word phrases.  Consults:  Critical care 2/4  Procedures:    Significant Diagnostic Tests:  Urine studies 2/4: Urine Osmo>> Urine sodium>>>    Micro Data:    Antimicrobials:    Interim history/subjective:  Reports short of breath  Objective   Blood pressure 96/60, pulse 96, temperature (Abnormal) 97.5 F (36.4 C), temperature source Oral, resp. rate 18, height 5\' 10"  (1.778 m), weight 83.5 kg, SpO2 100 %.       No intake or output data in the 24 hours ending 04/19/18 0954 Filed Weights   05/09/2018 1600 04/19/18 0000  Weight: 87.3 kg 83.5 kg    Examination: General: This is a frail 73 year old male patient who appears quite chronically ill and malnourished he  currently is exhibiting significant resting dyspnea with accessory muscle use HENT: Temporal wasting, sclera nonicteric, mucous membranes moist.  No JVD Lungs: More diminished on the left, tachypneic, mild accessory use Cardiovascular: Irregular irregular without murmur rub or gallop Abdomen: Firm, distended, shifting dullness  to percussion no organomegaly denies pain hypoactive Extremities: Pitting edema from pelvis down 4+.  Weeping skin, multiple areas of ecchymosis Neuro: Disoriented, knows name, place, year however not able to follow conversation. GU: Due to void  Resolved Hospital Problem list     Assessment & Plan:   Acute respiratory failure: Multifactorial.  Suspect most likely secondary to decreased abdominal compliance due to recurrent ascites, further complicated by chronic left effusion, And associated atelectasis from both these processes.  He also has underlying new metabolic derangements which could be contributing Plan Continue supplemental oxygen Proceed with paracentesis Consider thoracentesis however suspect this will simply re-accumulate  Non-anion gap metabolic acidosis also has primary respiratory alkalosis; he does have elevated LFTs, I wonder about evolving cirrhosis Plan Treating non-anion gap acidosis with bicarbonate infusion for now given concomitant hyperkalemia Considering reimaging of abdomen, might benefit from repeat CT imaging after paracentesis  Hypotension:  presume relative volume depletion Plan Adding sodium bicarbonate maintenance IV fluids Goal mean arterial pressure greater than 65 Giving 25 g of albumin given what looks to be underlying cirrhosis and ascites  Acute on chronic renal failure, stage IV with hyperkalemia Looks like baseline creatinine in the 2.3 range now up to 4.7 Suspect this is hypoperfusion from third spacing and volume loss from ascites -He is already received Lokelma, as well as bicarbonate. Plan Volume replacement Place Foley catheter Strict intake output We will follow-up chemistry this afternoon, if potassium rising in spite of all therapies will need nephrology consult sooner rather than later  Fluid and electrolyte imbalance:, Acute on chronic hyponatremia Plan Volume resuscitation Repeating urine studies Follow-up serial  chemistries Holding torsemide and spironolactone  Elevated LFTs  Plan Follow-up LFTs a.m. Consider CT imaging following paracentesis Hold Crestor and Zetia  History of paroxysmal atrial fibrillation as well as mechanical mitral valve repair Plan Continuing telemetry monitoring Holding Coumadin  Coumadin induced coagulopathy.  Likely exacerbated by hepatic dysfunction Plan Holding Coumadin Receiving vitamin K Daily INR  Severe protein calorie malnutrition with evidence of failure to thrive including: Falls, pressure ulcers, immobility Plan Palliative care consult Nutritional support as able I worry about ongoing malignancy looks like last scan was in November  Acute metabolic encephalopathy. This is somewhat exacerbated by hearing loss Plan Frequent reorientation Check ammonia  Chronic normocytic anemia Plan Trend CBC    Best practice:  Diet: no except meds Pain/Anxiety/Delirium protocol (if indicated): NA VAP protocol (if indicated): NA DVT prophylaxis: SCD GI prophylaxis: NA Glucose control: AN Mobility: BR, increase mobility Code Status: full code  Family Communication: pending Disposition: He is acutely more short of breath, has multiple metabolic derangements, he is quite frail, malnourished, deconditioned, and chronically ill.  I think he should be transferred to the intensive care, I am worried he may need dialysis if his hyperkalemia continues.  He may also need IV access.  I am not sure given his deconditioned state he can tolerate any of these therapies.  It appears as though he is chronically dyspneic, I think proceeding with paracentesis may be beneficial to help sort out what is acute and wants chronic care.  Either way given his nutritional status and what appears to be failure to thrive I will reach out to his family.  If he were to decline to the point where he required intensive care type support such as mechanical ventilation, vasoactive drips, and/or  dialysis given his deconditioned state I doubt he would survive that  Labs   CBC: Recent Labs  Lab 04/27/2018 1850 04/19/18 0252  WBC 15.0* 10.6*  NEUTROABS 13.7*  --   HGB 10.2* 9.2*  HCT 31.7* 29.2*  MCV 88.8 87.4  PLT 486* 401*    Basic Metabolic Panel: Recent Labs  Lab 04/19/2018 1850 05/03/2018 2341 04/19/18 0252 04/19/18 0738  NA 119* 118* 119*  --   K 6.9* 6.8* 6.8* 6.5*  CL 89* 90* 92*  --   CO2 17* 17* 21*  --   GLUCOSE 106* 99 94  --   BUN 80* 79* 88*  --   CREATININE 4.43* 4.54* 4.75*  --   CALCIUM 8.1* 7.9* 8.0*  --   MG  --   --  2.6*  --    GFR: Estimated Creatinine Clearance: 14.1 mL/min (A) (by C-G formula based on SCr of 4.75 mg/dL (H)). Recent Labs  Lab 05/05/2018 1850 04/19/18 0252  WBC 15.0* 10.6*    Liver Function Tests: Recent Labs  Lab 05/12/2018 1850 04/19/18 0252  AST 99* 87*  ALT 88* 77*  ALKPHOS 289* 269*  BILITOT 0.9 1.2  PROT 5.2* 4.7*  ALBUMIN 1.7* 1.4*   No results for input(s): LIPASE, AMYLASE in the last 168 hours. No results for input(s): AMMONIA in the last 168 hours.  ABG    Component Value Date/Time   PHART 7.412 04/19/2018 0900   PCO2ART 28.9 (L) 04/19/2018 0900   PO2ART 88.4 04/19/2018 0900   HCO3 18.1 (L) 04/19/2018 0900   ACIDBASEDEF 5.7 (H) 04/19/2018 0900   O2SAT 97.3 04/19/2018 0900     Coagulation Profile: Recent Labs  Lab 05/12/2018 1850 04/19/18 0252  INR 6.33* 6.73*    Cardiac Enzymes: No results for input(s): CKTOTAL, CKMB, CKMBINDEX, TROPONINI in the last 168 hours.  HbA1C: Hgb A1c MFr Bld  Date/Time Value Ref Range Status  12/01/2017 04:26 PM 5.7 (H) 4.8 - 5.6 % Final    Comment:    (NOTE)         Prediabetes: 5.7 - 6.4         Diabetes: >6.4         Glycemic control for adults with diabetes: <7.0     CBG: No results for input(s): GLUCAP in the last 168 hours.  Review of Systems:   Not able   Past Medical History  He,  has a past medical history of Anasarca (12/01/2017), Atrial  fibrillation ([place]) (07/12/2013), CAD (coronary artery disease) of artery bypass graft (07/12/2013), Chronic anticoagulation (07/12/2013), Essential hypertension (07/12/2013), History of mitral valve replacement with mechanical valve (01/11/2013), HOH (hard of hearing), Prostate cancer ([place]) (07/12/2013), and Renal insufficiency.   Surgical History    Past Surgical History:  Procedure Laterality Date  . BIOPSY  12/06/2017   Procedure: BIOPSY;  Surgeon: Otis Brace, MD;  Location: [place];  Service: Gastroenterology;;  . BIOPSY  03/04/2018   Procedure: BIOPSY;  Surgeon: Otis Brace, MD;  Location: [facility];  Service: Gastroenterology;;  . COLONOSCOPY WITH PROPOFOL N/A 12/06/2017   Procedure: COLONOSCOPY WITH PROPOFOL ;  Surgeon: Otis Brace, MD;  Location: [place];  Service: Gastroenterology;  Laterality: N/A;  . CORONARY ARTERY BYPASS GRAFT     2001  . ESOPHAGOGASTRODUODENOSCOPY (EGD) WITH PROPOFOL N/A 12/06/2017   Procedure: ESOPHAGOGASTRODUODENOSCOPY (EGD) WITH PROPOFOL;  Surgeon:  Otis Brace, MD;  Location: [place];  Service: Gastroenterology;  Laterality: N/A;  . ESOPHAGOGASTRODUODENOSCOPY (EGD) WITH PROPOFOL N/A 03/04/2018   Procedure: ESOPHAGOGASTRODUODENOSCOPY (EGD) WITH PROPOFOL;  Surgeon: Otis Brace, MD;  Location: [facility];  Service: Gastroenterology;  Laterality: N/A;  . ESOPHAGOGASTRODUODENOSCOPY (EGD) WITH PROPOFOL N/A 03/15/2018   Procedure: ESOPHAGOGASTRODUODENOSCOPY (EGD) WITH PROPOFOL;  Surgeon: Ronnette Juniper, MD;  Location: [place];  Service: Gastroenterology;  Laterality: N/A;  . IR PARACENTESIS  12/03/2017  . IR PARACENTESIS  01/04/2018  . IR PARACENTESIS  02/04/2018  . IR PARACENTESIS  04/07/2018  . IR RADIOLOGIST EVAL & MGMT  03/29/2018  . IR THORACENTESIS ASP PLEURAL SPACE W/IMG GUIDE  03/30/2018  . MITRAL VALVE REPLACEMENT  2001   Mechanical prosthesis  . POLYPECTOMY  12/06/2017   Procedure: POLYPECTOMY;  Surgeon:  Otis Brace, MD;  Location: [facility];  Service: Gastroenterology;;  . POLYPECTOMY  03/04/2018   Procedure: POLYPECTOMY;  Surgeon: Otis Brace, MD;  Location: [facility];  Service: Gastroenterology;;     Social History   reports that he has quit smoking. He has never used smokeless tobacco. He reports previous alcohol use. He reports that he does not use drugs.   Family History   His family history includes Healthy in his mother, sister, and sister; Heart attack in his father.   Allergies Allergies  Allergen Reactions  . Penicillins Other (See Comments)    DID THE REACTION INVOLVE: Swelling of the face/tongue/throat, SOB, or low BP? Unknown Sudden or severe rash/hives, skin peeling, or the inside of the mouth or nose? Unknown Did it require medical treatment? Yes When did it last happen?When pt was 73 years old Pt knows that his parents had to take him to the ER at the hospital and has not been retested with any PCN or cephalosporin that he is aware of.     Home Medications  Prior to Admission medications   Medication Sig Start Date End Date Taking? Authorizing Provider  Amino Acids-Protein Hydrolys (FEEDING SUPPLEMENT, PRO-STAT SUGAR FREE 64,) LIQD Take 30 mLs by mouth 3 (three) times daily with meals. Patient taking differently: Take 30 mLs by mouth every evening.  12/09/17  Yes Wilber Oliphant, MD  calcium-vitamin D (OSCAL WITH D) 500-200 MG-UNIT tablet Take 1 tablet by mouth daily.    Yes [provider]  Camphor-Eucalyptus-Menthol (VICKS VAPORUB EX) Apply 1 application topically See admin instructions. Apply under nose daily in the evening   Yes [provider]  Cholecalciferol (VITAMIN D-3) 25 MCG (1000 UT) CAPS Take 1,000 Units by mouth daily.   Yes [provider]  ezetimibe (ZETIA) 10 MG tablet Take 1 tablet (10 mg total) by mouth daily. PLEASE CALL TO SCHEDULE F/U APPT FOR FUTURE REFILLS.Marland Kitchen3RD AND FINAL ATTEMPT! 02/25/18  Yes Belva Crome, MD  medium chain triglycerides (MCT OIL) oil Take 15 mLs by mouth 3 (three) times daily with meals. Patient taking differently: Take 15 mLs by mouth at bedtime.  12/08/17  Yes Wilber Oliphant, MD  Multiple Vitamin (MULTIVITAMIN WITH MINERALS) TABS tablet Take 1 tablet by mouth daily. 12/09/17  Yes Wilber Oliphant, MD  Naphazoline-Pheniramine (OPCON-A) 0.027-0.315 % SOLN Place 2-3 drops into both eyes daily as needed (for dry eyes).   Yes [provider]  pantoprazole (PROTONIX) 40 MG tablet Take 1 tablet (40 mg total) by mouth 2 (two) times daily. 03/22/18  Yes Meccariello, Bernita Raisin, DO  rosuvastatin (CRESTOR) 40 MG tablet Take 1 tablet (40 mg total) by  mouth daily. PLEASE CALL TO SCHEDULE F/U APPT FOR FUTURE REFILLS.Marland Kitchen3RD AND FINAL ATTEMPT! 02/25/18  Yes Belva Crome, MD  warfarin (COUMADIN) 1 MG tablet TAKE AS [place] Patient taking differently: Take 0.5-1 mg by mouth See admin instructions. Take 0.5 mg by mouth daily on Monday, Wednesday. Take 1 mg by mouth daily on all other days. 01/24/18  Yes Belva Crome, MD     Critical care time: 50 minutes       Erick Colace ACNP-BC [facility] Pager # [phone] OR # [phone] if no answer

## 2018-04-19 NOTE — Progress Notes (Signed)
On call physician Dr.Olson notified of all critical lab values in person, verbally, while on the floor.

## 2018-04-19 NOTE — Progress Notes (Signed)
FPTS Interim Progress Note  S: Patient admitted yesterday evening for electrolyte light abnormalities and recent fall with no acute findings on imaging.  Patient is doing well this morning, he has dyspneic with conversation.  O: BP (!) 98/54   Pulse 91   Temp (!) (P) 97.5 F (36.4 C) (Oral)   Resp (!) 29   Ht 5\' 11"  (1.803 m)   Wt 84.3 kg   SpO2 100%   BMI 25.92 kg/m    General: Appears tired, alert and oriented HEENT: Dried blood on right lateral nose covered by Band-Aid.  Nasal cannula in place.  Large blood clot appreciated in left nare.  No active bleeding. Chest: Mechanical valve click Lungs: Decreased lung sounds in lower bases.  Unable to appreciate posterior lung sounds due to patient's respiratory distress at the time. Abdomen: Tense, distended.  No rebound guarding.  Tympanitic to percussion.  Mild pitting edema throughout. Extremities: Severe pitting edema from feet to proximal lower extremities.  Hands are edematous bilaterally  Recent Procedures  . IR PARACENTESIS 12/03/2017 - 4.5L chylous fluid . IR PARACENTESIS 01/04/2018 -4 L yllow, milk thick fluid . IR PARACENTESIS 02/04/2018 5L chylous fluid . IR PARACENTESIS 04/07/2018 - 8.6L yellow chylous fluids . IR RADIOLOGIST EVAL & MGMT 03/29/2018  - 1.7 L milky fluid  A/P: This is a 73 year old male with past medical history significant for chylous ascites of unknown etiology, progressive CKD, history of neuroendocrine tumor, chronic left pleural effusion, chronic lymphedema, status post mitral valve placement with mechanical valve on warfarin, hypertension, history of prostate cancer, CAD, hypertension.  Patient was seen at clinic on Friday 4 days prior to admission and was tachypneic with conversation.  Upon seeing the patient again this morning, his tachypnea is slightly worsened.  Patient denies any recent fevers or increased coughing or phlegm production.  His shortness of breath is likely due to accumulation of fluid in  his pleural area as well as ascites.  On chest CT, there is right-sided basilar effusion with mild atelectasis, left sided large pleural effusion with left middle lobe atelectasis.  There is no evidence of pneumonia or acute infectious process.  It is unclear why patient was placed on 2 L nasal cannula, he continues to sat at 100%, though he does have significant tachypnea.  In the setting of INR of 6.5, patient is not a candidate for thoracentesis or paracentesis at this time.  Will give vitamin K and monitor in the setting of his mitral valve prosthesis.  Patient was supposed to be seen at [facility] today to work-up neuroendocrine tumor and chylous ascites.  Per procedure notes, patient has had 4 therapeutic paracentesis with yellow milky thick chylous fluid.  On 114, thoracentesis noted to have 1.7 L of milky fluid.  It is not clear whether these 2 fluids are consistent with one another or if they have different make-up.  In the setting of patient's complex medical history and tachypnea, he was transferred to CCM who will take over management.  #Shortness of breath -likely due to excess fluid and pleural space and ascites  Serial INR status post vitamin K  Consult IR for thoracentesis and abdominal paracentesis  Send body fluids from pleural and and abdominal space for lab review  Give 12.5 mg/L of ascites removed  #CKD, hyponatremia, hyperkalemia -it is unclear why patient's creatinine and kidney function have declined so rapidly in the last month.  Patient was discontinued from nephrotoxic agents such as torsemide and spironolactone at most recent hospitalization  through early January.  It appears that his ascites has continued to build up more quickly without these medications.  Renal has been consulted in the setting of patient's complex history  Appreciate renal recommendations  Continue albumin with paracentesis  #Chylous ascites -unknown etiology.  Patient was scheduled for follow-up at [facility] for further work-up of neuroendocrine tumor and chylous ascites.  He was also seen recently by Dr. Laurence Ferrari at [facility] for possible lymphangiogram to work-up chylous ascites.  Consult IR for possible inpatient lymphangiogram once patient is stable  #INR -patient arrived supratherapeutic at 6.33 with increased to 6.73 0300.  Patient was last seen at anticoagulation clinic on 123 with INR of 2.8.  At home, he is very diligent with his medications and denies taking extra doses.  Hold Coumadin  Dosing vitamin K 2.5 mg  Follow-up on INR  #Afib   stable  Telemetry  Monitor INR, goal 2.5  Wilber Oliphant, MD 04/19/2018, 3:20 PM PGY-1, [place] [facility] pager [phone]

## 2018-04-19 NOTE — Progress Notes (Signed)
Patient ID: Mario Powell, male   DOB: [birth year], 73 y.o.   MRN: [id number] Paged by Dr. Lucile Shutters regarding the patient developing increasing work of breathing and persistent hyperkalemia noted on labs.  Urine output reviewed, he is oliguric. BP 91/69   Pulse (!) 134   Temp (!) 97.5 F (36.4 C) (Oral)   Resp (!) 30   Ht 5\' 11"  (1.803 m)   Wt 84.3 kg   SpO2 100%   BMI 25.92 kg/m   I examined the patient and discussed with his daughters who were at bedside regarding our recommendation of proceeding with CRRT for regulation of metabolic abnormalities, management of azotemia and assisting with volume management.  Patient/daughters is in agreement with proceeding forward with this.  Dr. Lucile Shutters to coordinate placement of dialysis catheter through the critical care service.  CRRT orders placed.  Elmarie Shiley MD [facility]. Office # [phone] Pager # [phone] 8:52 PM

## 2018-04-20 ENCOUNTER — Ambulatory Visit (HOSPITAL_COMMUNITY): Payer: Medicare Other

## 2018-04-20 LAB — RENAL FUNCTION PANEL
Albumin: 2.1 g/dL — ABNORMAL LOW (ref 3.5–5.0)
Albumin: 2 g/dL — ABNORMAL LOW (ref 3.5–5.0)
Albumin: 2.1 g/dL — ABNORMAL LOW (ref 3.5–5.0)
Albumin: 2 g/dL — ABNORMAL LOW (ref 3.5–5.0)
Albumin: 1.9 g/dL — ABNORMAL LOW (ref 3.5–5.0)
Anion gap: 12 (ref 5–15)
Anion gap: 10 (ref 5–15)
Anion gap: 10 (ref 5–15)
Anion gap: 13 (ref 5–15)
Anion gap: 10 (ref 5–15)
BUN: 73 mg/dL — AB (ref 8–23)
BUN: 67 mg/dL — ABNORMAL HIGH (ref 8–23)
BUN: 61 mg/dL — ABNORMAL HIGH (ref 8–23)
BUN: 54 mg/dL — ABNORMAL HIGH (ref 8–23)
BUN: 53 mg/dL — ABNORMAL HIGH (ref 8–23)
CO2: 20 mmol/L — ABNORMAL LOW (ref 22–32)
CO2: 19 mmol/L — ABNORMAL LOW (ref 22–32)
CO2: 19 mmol/L — ABNORMAL LOW (ref 22–32)
CO2: 20 mmol/L — ABNORMAL LOW (ref 22–32)
CO2: 21 mmol/L — ABNORMAL LOW (ref 22–32)
CREATININE: 4.26 mg/dL — AB (ref 0.61–1.24)
CREATININE: 3.94 mg/dL — AB (ref 0.61–1.24)
Calcium: 7.7 mg/dL — ABNORMAL LOW (ref 8.9–10.3)
Calcium: 7.6 mg/dL — ABNORMAL LOW (ref 8.9–10.3)
Calcium: 7.8 mg/dL — ABNORMAL LOW (ref 8.9–10.3)
Calcium: 7.7 mg/dL — ABNORMAL LOW (ref 8.9–10.3)
Calcium: 7.6 mg/dL — ABNORMAL LOW (ref 8.9–10.3)
Chloride: 91 mmol/L — ABNORMAL LOW (ref 98–111)
Chloride: 96 mmol/L — ABNORMAL LOW (ref 98–111)
Chloride: 98 mmol/L (ref 98–111)
Chloride: 94 mmol/L — ABNORMAL LOW (ref 98–111)
Chloride: 97 mmol/L — ABNORMAL LOW (ref 98–111)
Creatinine, Ser: 3.71 mg/dL — ABNORMAL HIGH (ref 0.61–1.24)
Creatinine, Ser: 3.29 mg/dL — ABNORMAL HIGH (ref 0.61–1.24)
Creatinine, Ser: 3.16 mg/dL — ABNORMAL HIGH (ref 0.61–1.24)
GFR calc Af Amer: 15 mL/min — ABNORMAL LOW (ref >60)
GFR calc Af Amer: 16 mL/min — ABNORMAL LOW (ref >60)
GFR calc Af Amer: 18 mL/min — ABNORMAL LOW (ref >60)
GFR calc Af Amer: 20 mL/min — ABNORMAL LOW (ref >60)
GFR calc Af Amer: 21 mL/min — ABNORMAL LOW (ref >60)
GFR calc non Af Amer: 13 mL/min — ABNORMAL LOW (ref >60)
GFR calc non Af Amer: 14 mL/min — ABNORMAL LOW (ref >60)
GFR calc non Af Amer: 15 mL/min — ABNORMAL LOW (ref >60)
GFR calc non Af Amer: 17 mL/min — ABNORMAL LOW (ref >60)
GFR, EST NON AFRICAN AMERICAN: 18 mL/min — AB (ref >60)
GLUCOSE: 107 mg/dL — AB (ref 70–99)
Glucose, Bld: 93 mg/dL (ref 70–99)
Glucose, Bld: 106 mg/dL — ABNORMAL HIGH (ref 70–99)
Glucose, Bld: 122 mg/dL — ABNORMAL HIGH (ref 70–99)
Glucose, Bld: 132 mg/dL — ABNORMAL HIGH (ref 70–99)
POTASSIUM: 5.3 mmol/L — AB (ref 3.5–5.1)
Phosphorus: 5.2 mg/dL — ABNORMAL HIGH (ref 2.5–4.6)
Phosphorus: 4.9 mg/dL — ABNORMAL HIGH (ref 2.5–4.6)
Phosphorus: 4.7 mg/dL — ABNORMAL HIGH (ref 2.5–4.6)
Phosphorus: 4.6 mg/dL (ref 2.5–4.6)
Phosphorus: 4.4 mg/dL (ref 2.5–4.6)
Potassium: 5.7 mmol/L — ABNORMAL HIGH (ref 3.5–5.1)
Potassium: 5.9 mmol/L — ABNORMAL HIGH (ref 3.5–5.1)
Potassium: 5.9 mmol/L — ABNORMAL HIGH (ref 3.5–5.1)
Potassium: 5.3 mmol/L — ABNORMAL HIGH (ref 3.5–5.1)
SODIUM: 123 mmol/L — AB (ref 135–145)
Sodium: 125 mmol/L — ABNORMAL LOW (ref 135–145)
Sodium: 127 mmol/L — ABNORMAL LOW (ref 135–145)
Sodium: 127 mmol/L — ABNORMAL LOW (ref 135–145)
Sodium: 128 mmol/L — ABNORMAL LOW (ref 135–145)

## 2018-04-20 LAB — HEPARIN LEVEL (UNFRACTIONATED): Heparin Unfractionated: <0.1 IU/mL — ABNORMAL LOW (ref 0.30–0.70)

## 2018-04-20 LAB — PREPARE FRESH FROZEN PLASMA: Unit division: 0

## 2018-04-20 LAB — CBC
HCT: 23.8 % — ABNORMAL LOW (ref 39.0–52.0)
Hemoglobin: 7.3 g/dL — ABNORMAL LOW (ref 13.0–17.0)
MCH: 27.5 pg (ref 26.0–34.0)
MCHC: 30.7 g/dL (ref 30.0–36.0)
MCV: 89.8 fL (ref 80.0–100.0)
NRBC: 0 % (ref 0.0–0.2)
Platelets: 305 10*3/uL (ref 150–400)
RBC: 2.65 MIL/uL — ABNORMAL LOW (ref 4.22–5.81)
RDW: 16.1 % — ABNORMAL HIGH (ref 11.5–15.5)
WBC: 7.6 10*3/uL (ref 4.0–10.5)

## 2018-04-20 LAB — ALBUMIN, PLEURAL OR PERITONEAL FLUID: Albumin, Fluid: <1 g/dL

## 2018-04-20 LAB — BPAM FFP
BLOOD PRODUCT EXPIRATION DATE: 202002092359
Blood Product Expiration Date: 202002092359
Blood Product Expiration Date: 202002092359
Blood Product Expiration Date: 202002092359
Blood Product Expiration Date: 202002092359
ISSUE DATE / TIME: 202002041458
ISSUE DATE / TIME: 202002041643
ISSUE DATE / TIME: 202002041739
ISSUE DATE / TIME: 202002041552
Unit Type and Rh: 6200
Unit Type and Rh: 6200
Unit Type and Rh: 6200
Unit Type and Rh: 6200
Unit Type and Rh: 6200

## 2018-04-20 LAB — PROTIME-INR
INR: 2.31
INR: 2.36
Prothrombin Time: 25.1 seconds — ABNORMAL HIGH (ref 11.4–15.2)
Prothrombin Time: 25.5 seconds — ABNORMAL HIGH (ref 11.4–15.2)

## 2018-04-20 LAB — BODY FLUID CELL COUNT WITH DIFFERENTIAL
Lymphs, Fluid: 3 %
Monocyte-Macrophage-Serous Fluid: 70 % (ref 50–90)
Neutrophil Count, Fluid: 27 % — ABNORMAL HIGH (ref 0–25)
Total Nucleated Cell Count, Fluid: 117 cu mm (ref 0–1000)

## 2018-04-20 LAB — AMYLASE, PLEURAL OR PERITONEAL FLUID: Amylase, Fluid: 268 U/L

## 2018-04-20 LAB — GLUCOSE, CAPILLARY
GLUCOSE-CAPILLARY: 101 mg/dL — AB (ref 70–99)
Glucose-Capillary: 88 mg/dL (ref 70–99)
Glucose-Capillary: 68 mg/dL — ABNORMAL LOW (ref 70–99)
Glucose-Capillary: 87 mg/dL (ref 70–99)
Glucose-Capillary: 90 mg/dL (ref 70–99)
Glucose-Capillary: 115 mg/dL — ABNORMAL HIGH (ref 70–99)

## 2018-04-20 LAB — PROTEIN, PLEURAL OR PERITONEAL FLUID: Total protein, fluid: <3 g/dL

## 2018-04-20 LAB — GLUCOSE, PLEURAL OR PERITONEAL FLUID: Glucose, Fluid: 107 mg/dL

## 2018-04-20 LAB — POTASSIUM: Potassium: 5.3 mmol/L — ABNORMAL HIGH (ref 3.5–5.1)

## 2018-04-20 LAB — LACTATE DEHYDROGENASE, PLEURAL OR PERITONEAL FLUID: LD, Fluid: 119 U/L — ABNORMAL HIGH (ref 3–23)

## 2018-04-20 LAB — MAGNESIUM: Magnesium: 2.5 mg/dL — ABNORMAL HIGH (ref 1.7–2.4)

## 2018-04-20 MED ORDER — PRISMASOL BGK 0/2.5 32-2.5 MEQ/L IV SOLN
INTRAVENOUS | Status: DC
  Administered 2018-04-20 – 2018-04-21 (×5): via INTRAVENOUS_CENTRAL
  Filled 2018-04-20 (×7): qty 5000

## 2018-04-20 MED ORDER — PANTOPRAZOLE SODIUM 40 MG IV SOLR
40.0000 mg | Freq: Two times a day (BID) | INTRAVENOUS | Status: DC
  Administered 2018-04-20 – 2018-04-21 (×3): 40 mg via INTRAVENOUS
  Filled 2018-04-20 (×3): qty 40

## 2018-04-20 MED ORDER — DEXTROSE 50 % IV SOLN
INTRAVENOUS | Status: AC
  Administered 2018-04-20: 25 mL
  Filled 2018-04-20: qty 50

## 2018-04-20 MED ORDER — ORAL CARE MOUTH RINSE
15.0000 mL | Freq: Two times a day (BID) | OROMUCOSAL | Status: DC
  Administered 2018-04-20 – 2018-04-21 (×3): 15 mL via OROMUCOSAL

## 2018-04-20 MED ORDER — NOREPINEPHRINE 16 MG/250ML-% IV SOLN
0.0000 ug/min | INTRAVENOUS | Status: DC
  Administered 2018-04-20: 5.3333 ug/min via INTRAVENOUS
  Filled 2018-04-20 (×2): qty 250

## 2018-04-20 MED ORDER — HEPARIN (PORCINE) 25000 UT/250ML-% IV SOLN
1200.0000 [IU]/h | INTRAVENOUS | Status: DC
  Filled 2018-04-20: qty 250

## 2018-04-20 MED ORDER — PRISMASOL BGK 0/2.5 32-2.5 MEQ/L IV SOLN
INTRAVENOUS | Status: DC
  Administered 2018-04-20 (×2): via INTRAVENOUS_CENTRAL
  Filled 2018-04-20 (×5): qty 5000

## 2018-04-20 MED ORDER — SODIUM ZIRCONIUM CYCLOSILICATE 10 G PO PACK
10.0000 g | PACK | Freq: Two times a day (BID) | ORAL | Status: DC
  Administered 2018-04-20: 10 g via ORAL
  Filled 2018-04-20 (×3): qty 1

## 2018-04-20 MED ORDER — DEXTROSE 5 % IV SOLN
INTRAVENOUS | Status: DC
  Administered 2018-04-20 – 2018-04-21 (×3): via INTRAVENOUS

## 2018-04-20 MED ORDER — HEPARIN SODIUM (PORCINE) 1000 UNIT/ML DIALYSIS: 1000.0000 [IU] | INTRAMUSCULAR | Status: DC | PRN

## 2018-04-20 MED ORDER — MIDODRINE HCL 5 MG PO TABS: 10.0000 mg | ORAL_TABLET | Freq: Three times a day (TID) | ORAL | Status: DC

## 2018-04-20 MED ORDER — PRISMASOL BGK 0/2.5 32-2.5 MEQ/L IV SOLN
INTRAVENOUS | Status: DC
  Administered 2018-04-20: 16:00:00 via INTRAVENOUS_CENTRAL
  Filled 2018-04-20 (×2): qty 5000

## 2018-04-20 NOTE — Progress Notes (Addendum)
PCCM Overnight  Called to the bedside for pt's tachypnea and increased work of breathing Abdomen was distended with pronounced fluid wave Pt has a h/o ascites and pleural effusion  Hyperkalemia also did not resolve with medical Rx his Creatinine is above baseline and he is oliguric. Renal is following and has asked for access for CVVHDF  Discussed in detail with Family ( 2 daughters) and patient ( who is hard of hearing and had is hearing aid in place for conversation).  We discussed the following conditions and procedures ( including risks and benefits) for Rx:  1. Acute Kidney Injury with hyperkalemia- requiring CVVHDF- Pt and family agreed to placement of trialysis catheter.  2. Abdominal Ascites - increased WOB and tachypnea- Pt and family agreed to therapeutic paracentesis 3. Left sided Pleural Effusion - Thoracentesis/ Chest Tube/ PleurX catheter placement- Pt and family asked to wait on this procedure as his breathing improved post Paracentesis. We did discuss his most recent CXR after placement of RIJ Trialysis. Pt and daughter still wants to wait on procedure.  I have discussed the plan with patient, family, NP Burnell Blanks MD and RN  Signed Dr Seward Carol Pulmonary Critical Care Locums

## 2018-04-20 NOTE — Progress Notes (Addendum)
[facility] Progress Note    Assessment/ Plan:   1. Progressive renal injury: given ascites, increasing frequency of paractenesis, borderline low Bps, this really looks like hepatorenal syndrome based on physiologic picture.  The etiology of ascites is yet unknown.  Nephrogenic ascites is usually not chylous.  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  on CRRT for mult metabolic derangements.  Close labs.  If Na corrects too quickly with CRRT may need to run D5 against it.  Continue Lokelma UPEP recently with large K/L light chain ratio but without definitive Bence-Jones protein.  ?  Atypical presentation of amyloid, his UP/C doesn't reflect nephrotic range proteinuria at this point so don't think a kidney biopsy would be helpful here (or possible really given the circumstances).    2.  Hyponatremia and hyperkalemia: TSH a little high, rec levothyroxine.  AM cortisol OK.  CRRT and plan as above  3.  Chylous ascites: s/p 5L paracentesis.  Extensive workup with negative results so far.  Lymphangiogram was being considered.  There are case reports of somatostatin improving chylous ascites in post-op pts but this isn't exactly what we are dealing with.    4.  Hypotension: not on pressor yet, adding midodrine today.  5.  Dispo: poor prognosis it looks like    Subjective:    Started on CRRT overnight for refractory hyperkalemia.  Blood pressures are tenuous.  5L paracentesis today.     Objective:   BP (!) 77/63 (BP Location: Left Arm)   Pulse 74   Temp (!) 96.6 F (35.9 C) (Axillary)   Resp 15   Ht 5\' 11"  (1.803 m)   Wt 84.3 kg   SpO2 100%   BMI 25.92 kg/m   Intake/Output Summary (Last 24 hours) at 04/20/2018 1112 Last data filed at 04/20/2018 1000 Gross per 24 hour  Intake 940.07 ml  Output 5241 ml  Net -4300.93 ml   Weight change: -3 kg  Physical Exam: GEN frail and ill-appearing HEENT  EOMI PERRL wearing glasses, scrape on side of nose, temporal wasting NECK + JVD to angle of the mandible PULM muffled L > R CV tachycardic, + S3 ABD distended, + umbilical hernia, + fluid wave EXT 3+ pitting LE edema NEURO AAO x 3 no asterixis SKIN: mult ecchymoses, some mild jaundice  Imaging: Dg Chest 2 View  Result Date: 05/11/2018 CLINICAL DATA:  Shortness of breath and hypoxia. Fell yesterday. EXAM: CHEST - 2 VIEW COMPARISON:  03/30/2018. FINDINGS: Interval large left pleural effusion and mild left lung atelectasis. Small right pleural effusion and small amount of right basilar atelectasis. 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 No pneumothorax. Unremarkable bones. IMPRESSION: 1. Interval large left pleural effusion and mild left lung atelectasis. 2. Small right pleural effusion. 3. Mild right basilar atelectasis. Electronically Signed   By: Claudie Revering M.D.   On: 04/29/2018 18:21   Ct Head Wo Contrast  Result Date: 05/11/2018 CLINICAL DATA:  Fall yesterday.  Trauma to back of head. EXAM: CT HEAD WITHOUT CONTRAST CT CERVICAL SPINE WITHOUT CONTRAST TECHNIQUE: Multidetector CT imaging of the head and cervical spine was performed following the standard protocol without intravenous contrast. Multiplanar CT image reconstructions of the cervical spine were also generated. COMPARISON:  None. FINDINGS: CT HEAD FINDINGS Brain: Mild atrophy and white matter changes are stable. No acute infarct, hemorrhage, or mass lesion is present. The ventricles are of  proportionate to the degree of atrophy. No significant extraaxial fluid collection is present. The brainstem and cerebellum are within normal limits. Vascular: Atherosclerotic calcifications present in the cavernous internal carotid arteries bilaterally and at the dural margin of both vertebral arteries. No hyperdense vessel. Skull: Calvarium is intact. No focal lytic or blastic lesions are present. Sinuses/Orbits: The paranasal sinuses and  mastoid air cells are clear. The globes and orbits are within normal limits. CT CERVICAL SPINE FINDINGS Alignment: Grade 1 degenerative anterolisthesis is present at C4-5. AP alignment is otherwise anatomic. Skull base and vertebrae: Craniocervical junction is normal. Acute or healing fractures are present. Soft tissues and spinal canal: Atherosclerotic calcifications are present at the carotid bifurcations bilaterally. High-grade stenosis is suspected on the right. No focal mass lesion is present. Thyroid is normal. No significant adenopathy is present. Disc levels: Osseous foraminal narrowing is present on the left at C4-5 due to uncovertebral and facet disease. 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 Upper chest: A left pleural effusion is noted. IMPRESSION: 1. No acute trauma to the head or cervical spine. 2. Normal CT appearance of the brain for age. 3. Degenerative changes of the cervical spine are most evident at C4-5 on the left. 4. 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 5. Left pleural effusion. Electronically Signed   By: San Morelle M.D.   On: 05/10/2018 17:42   Ct Cervical Spine Wo Contrast  Result Date: 05/05/2018 CLINICAL DATA:  Fall yesterday.  Trauma to back of head. EXAM: CT HEAD WITHOUT CONTRAST CT CERVICAL SPINE WITHOUT CONTRAST TECHNIQUE: Multidetector CT imaging of the head and cervical spine was performed following the standard protocol without intravenous contrast. Multiplanar CT image reconstructions of the cervical spine were also generated. COMPARISON:  None. FINDINGS: CT HEAD FINDINGS Brain: Mild atrophy and white matter changes are stable. No acute infarct, hemorrhage, or mass lesion is present. The ventricles are of proportionate to the degree of atrophy. No significant extraaxial fluid collection is present. The brainstem and cerebellum are within normal limits. Vascular: Atherosclerotic calcifications present in the cavernous internal carotid arteries bilaterally and  at the dural margin of both vertebral arteries. No hyperdense vessel. Skull: Calvarium is intact. No focal lytic or blastic lesions are present. Sinuses/Orbits: The paranasal sinuses and mastoid air cells are clear. The globes and orbits are within normal limits. CT CERVICAL SPINE FINDINGS Alignment: Grade 1 degenerative anterolisthesis is present at C4-5. AP alignment is otherwise anatomic. Skull base and vertebrae: Craniocervical junction is normal. Acute or healing fractures are present. Soft tissues and spinal canal: Atherosclerotic calcifications are present at the carotid bifurcations bilaterally. High-grade stenosis is suspected on the right. No focal mass lesion is present. Thyroid is normal. No significant adenopathy is present. Disc levels: Osseous foraminal narrowing is present on the left at C4-5 due to uncovertebral and facet disease. 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 Upper chest: A left pleural effusion is noted. IMPRESSION: 1. No acute trauma to the head or cervical spine. 2. Normal CT appearance of the brain for age. 3. Degenerative changes of the cervical spine are most evident at C4-5 on the left. 4. 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 5. Left pleural effusion. Electronically Signed   By: San Morelle M.D.   On: 04/29/2018 17:42   Dg Chest Port 1 View  Result Date: 04/19/2018 CLINICAL DATA:  73 year old male central line placement. Status post fall 2 days ago. History of ascites and left-side pleural effusion. EXAM: PORTABLE CHEST 1 VIEW  COMPARISON:  05/06/2018 chest radiographs and earlier. FINDINGS: Portable AP semi upright view at 2202 hours. Right side dual lumen central venous catheter has been placed and the tip projects at the lower SVC level below the carina. The left pleural effusion which was large on 05/05/2018 now completely opacifies the left hemithorax. Mild rightward shift of the mediastinum is stable. The right lung remains clear. Chronic cardiac valve  replacement redemonstrated. No acute osseous abnormality identified. IMPRESSION: 1. Dual lumen right IJ central line placed with tip at the lower SVC level. 2. Progressed large left pleural effusion since yesterday, now completely opacifying the left hemithorax. Stable mild rightward mass effect on the mediastinum. 3. No right lung pneumothorax or acute pulmonary opacity. Electronically Signed   By: Genevie Ann M.D.   On: 04/19/2018 22:43   Dg Hand Complete Left  Result Date: 05/02/2018 CLINICAL DATA:  Left hand pain and swelling following a fall yesterday. EXAM: LEFT HAND - COMPLETE 3+ VIEW COMPARISON:  None. FINDINGS: Portions of the index finger obscured by a pulse oximeter. Screw and plate fixation of an old, healed distal radius fracture. No acute fracture or dislocation is seen. Diffuse arterial calcifications are noted. Mild diffuse soft tissue swelling. IMPRESSION: No acute fracture or dislocation. Electronically Signed   By: Claudie Revering M.D.   On: 04/27/2018 18:23   Dg Foot Complete Left  Result Date: 05/08/2018 CLINICAL DATA:  Left foot pain and swelling following a fall yesterday. EXAM: LEFT FOOT - COMPLETE 3+ VIEW COMPARISON:  None. FINDINGS: Marked diffuse soft tissue swelling. Atheromatous arterial calcifications. No fracture or dislocation. Mild to moderate inferior calcaneal spur formation. IMPRESSION: 1. No fracture or dislocation. 2. Marked diffuse soft tissue swelling. Electronically Signed   By: Claudie Revering M.D.   On: 04/30/2018 18:22    Labs: BMET Recent Labs  Lab 04/22/2018 1850 05/02/2018 2341 04/19/18 0252 04/19/18 1856 04/19/18 1202 04/19/18 1526 04/19/18 1832 04/19/18 2133 04/20/18 0305 04/20/18 0945  NA 119* 118* 119*  --  120*  --   --   --  123* 125*  K 6.9* 6.8* 6.8* 6.5* 6.4* 6.3* 6.2* 6.0* 5.7* 5.9*  CL 89* 90* 92*  --  91*  --   --   --  91* 96*  CO2 17* 17* 21*  --  16*  --   --   --  20* 19*  GLUCOSE 106* 99 94  --  104*  --   --   --  93 106*  BUN 80* 79* 88*   --  84*  --   --   --  73* 67*  CREATININE 4.43* 4.54* 4.75*  --  4.78*  --   --   --  4.26* 3.94*  CALCIUM 8.1* 7.9* 8.0*  --  8.2*  --   --   --  7.7* 7.6*  PHOS  --   --   --   --   --   --   --   --  5.2* 4.9*   CBC Recent Labs  Lab 05/08/2018 1850 04/19/18 0252 04/20/18 0305  WBC 15.0* 10.6* 7.6  NEUTROABS 13.7*  --   --   HGB 10.2* 9.2* 7.3*  HCT 31.7* 29.2* 23.8*  MCV 88.8 87.4 89.8  PLT 486* 401* 305    Medications:    . calcium-vitamin D  1 tablet Oral Daily  . cholecalciferol  1,000 Units Oral Daily  . dextrose      . medium chain triglycerides  15 mL Oral QHS  . mouth rinse  15 mL Mouth Rinse BID  . pantoprazole  40 mg Oral BID  . phytonadione  10 mg Oral Daily  . sodium bicarbonate  1,300 mg Oral TID  . sodium zirconium cyclosilicate  10 g Oral Daily      Madelon Lips MD [facility]  pgr [phone] 04/20/2018, 11:12 AM

## 2018-04-20 NOTE — Progress Notes (Signed)
CBG 68 given D50 1/2 amp IV for CBG less than 70 will recheck in 15"

## 2018-04-20 NOTE — Progress Notes (Signed)
Follow up CBG 101

## 2018-04-20 NOTE — Progress Notes (Signed)
PCCM Update    I, Dr Seward Carol have personally reviewed patient's available data, including medical history, events of note, physical examination and test results as part of my evaluation. I have discussed with NP Moshe Cipro and other care providers such as RN and Elink.  In addition,  I personally evaluated this  Patient and had a conversation with him and his two daughters.  Despite correction of his INR, Paracentesis w/ drainage of 4.5L and initiation of CRRT pt continues to have a clinical decline.  He had a large left sided pleural effusion but given his h/o ex vacuo and the etiology of the effusion it is unlikely that thoracentesis will provide pronounced relief.  I explained to the patient and his daughters regarding possible outcomes and should his respiratory status decline further that he may require intubation. The patient expressed that he did not want to be intubated,  He understands that his clinical condition has worsened and that our current interventions may not completely reverse his symptoms given the fact that the underlying etiology is still not addressed.  The patient expressed to his daughters that he did not want to be intubated and that if he continues to get worse he does not want to continue on machines.   His daughters asked several questions to clarify options and at the conclusion of our conversation the decision was made to continue with current care on CRRT and vasopressors. However, he is now a DNI and DNR  The patient is critically ill with multiple organ systems failure and requires high complexity decision making for assessment and support, frequent evaluation and titration of therapies, application of advanced monitoring technologies and extensive interpretation of multiple databases.   Critical Care Time devoted to patient care services described in this note is 30 Minutes. This time reflects time of care of this signee Dr Seward Carol. This critical  care time reflects the care discussion time   CC TIME: 30 minutes CODE STATUS: DNR DNI PROGNOSIS: poor  Dr. Seward Carol Pulmonary Critical Care Medicine  04/20/2018 9:28 PM

## 2018-04-20 NOTE — Progress Notes (Signed)
Renal panel in process approximately 0300 but not resulted until approximately 0530 due to laboratory delay.  Pt's respirations improved this shift post paracentesis, but remain labored, tachypneic.  Dr. Posey Pronto updated on pt's renal progress on CRRT overnight.

## 2018-04-20 NOTE — Progress Notes (Signed)
Initial Nutrition Assessment  DOCUMENTATION CODES:   Severe malnutrition in context of chronic illness  INTERVENTION:   If aligns with goals of care, recommend insertion of Cortrak tube with initiation of EN  Recommend MVI  Recommend B-complex with Vitamin C  NUTRITION DIAGNOSIS:   Severe Malnutrition related to chronic illness as evidenced by edema, severe fat depletion, severe muscle depletion.   GOAL:   Patient will meet greater than or equal to 90% of their needs   MONITOR:   Labs, Weight trends  REASON FOR ASSESSMENT:   Other (Comment)(CRRT, hx of malnutrition)    ASSESSMENT:   73 yo male admitted after falling getting into vehicle with AKI and hyperkalemia requiring CRRT, acute respiratory distress related to large effusion and ascites. 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   Palliative care has been consulted. If aligns with goals of care, recommend insertion of Cortrak tube with initiation of nutrition support. This was discussed with CCM MD. Await palliative care  Daughter at bedside. She reports pt with limited mobility due to chronic LE edema but had been using walker to mobilize around the house prior to fall.  Daughter reports pt has been eating ok but eats small portions. Pt was also taking Ensure/Boost but not as often as he was prescribed. Pt taking MCT oil at bedtime  Unsure of pt dry weight given chronic ascites, lymphedema. Noted pt has been requiring frequent paracentesis and thoracentesis. Current wt 84.3 kg; lowest weight per weight encounters was 81.3 kg.  Pt with severe very deep pitting B/L LE. Plan to utilize IBW for estimating needs  Labs: sodium 127 (L), potassium 5.9 (H), phosphorus 4.7 Meds: calcium-vitamin D,  cholecalciferol, MCT oil at bedtime, sodium biarb tablet, Lokelma  NUTRITION - FOCUSED PHYSICAL EXAM:    Most Recent Value  Orbital Region  Severe depletion  Upper Arm Region  Unable to assess  Thoracic and Lumbar Region  Severe depletion  Buccal Region  Severe depletion  Temple Region  Severe depletion  Clavicle Bone Region  Severe depletion  Clavicle and Acromion Bone Region  Severe depletion  Scapular Bone Region  Severe depletion  Dorsal Hand  Unable to assess  Patellar Region  Unable to assess  Anterior Thigh Region  Unable to assess  Posterior Calf Region  Unable to assess  Edema (RD Assessment)  Severe       Diet Order:   Diet Order            Diet NPO time specified Except for: Sips with Meds  Diet effective now              EDUCATION NEEDS:   Not appropriate for education at this time  Skin:  Skin Assessment: Skin Integrity Issues: Skin Integrity Issues:: DTI, Other (Comment) DTI: right ischeial tuberosity Other: skin tears, MASD   Last BM:  2/02  Height:   Ht Readings from Last 1 Encounters:  04/19/18 5\' 11"  (1.803 m)    Weight:   Wt Readings from Last 1 Encounters:  04/20/18 84.3 kg    Ideal Body Weight:  78 kg  BMI:  Body mass index is 25.92 kg/m.  Estimated Nutritional Needs:   Kcal:  2300-2500 kcals   Protein:  150-170 g   Fluid:  per MD  Kerman Passey MS, RD, LDN,  CNSC [phone] Pager  [phone] Weekend/On-Call Pager

## 2018-04-20 NOTE — Progress Notes (Addendum)
ANTICOAGULATION CONSULT NOTE - Initial Consult  Pharmacy Consult for Heparin Indication: hx afib/MVR  Allergies  Allergen Reactions  . Penicillins Other (See Comments)    DID THE REACTION INVOLVE: Swelling of the face/tongue/throat, SOB, or low BP? Unknown Sudden or severe rash/hives, skin peeling, or the inside of the mouth or nose? Unknown Did it require medical treatment? Yes When did it last happen?When pt was 73 years old Pt knows that his parents had to take him to the ER at the hospital and has not been retested with any PCN or cephalosporin that he is aware of.    Patient Measurements: Height: 5\' 11"  (180.3 cm) Weight: 185 lb 13.6 oz (84.3 kg) IBW/kg (Calculated) : 75.3 Heparin Dosing Weight: 84.3 kg  Vital Signs: Temp: 96.6 F (35.9 C) (02/05 0600) Temp Source: Axillary (02/05 0600) BP: 88/67 (02/05 1100) Pulse Rate: 116 (02/05 1100)  Labs: Recent Labs    05/11/2018 1850  04/19/18 0252 04/19/18 1202 04/19/18 1832 04/20/18 0305 04/20/18 0921 04/20/18 0945  HGB 10.2*  --  9.2*  --   --  7.3*  --   --   HCT 31.7*  --  29.2*  --   --  23.8*  --   --   PLT 486*  --  401*  --   --  305  --   --   LABPROT 54.8*  --  57.4*  --  24.4* 25.1* 25.5*  --   INR 6.33*  --  6.73*  --  2.24 2.31 2.36  --   CREATININE 4.43*   < > 4.75* 4.78*  --  4.26*  --  3.94*   < > = values in this interval not displayed.   Estimated Creatinine Clearance: 17.5 mL/min (A) (by C-G formula based on SCr of 3.94 mg/dL (H)).   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 INR 6.33 on admit, reversed with FFP and vitamin K. INR now 2.36. Pharmacy consulted to dose IV heparin. Hgb 7.3, pltc WNL. No active bleeding noted.   Goal of Therapy:  Heparin level 0.3-0.7 units/ml Monitor platelets by anticoagulation protocol: Yes   Plan:  No heparin bolus Start IV heparin gtt at 1200 units/hr Check heparin level in 8 hours Daily  heparin level and CBC Monitor s/sx of bleeding  ADDENDUM: Will hold off on starting heparin for now given possible thoracentesis. Order discontinued. F/u plans for procedure, need to start IV heparin.   Mila Merry Gerarda Fraction, PharmD, [place] PGY2 Infectious Diseases Pharmacy Resident Phone: [phone] 04/20/2018,11:26 AM

## 2018-04-20 NOTE — Plan of Care (Signed)
Educating pt and family about procedures, labs and meds given. Pt does not appear anxious - family very calming to patient & supportive

## 2018-04-20 NOTE — Consult Note (Addendum)
NAME:  Mario Powell, MRN:  [id number], DOB:  [birth year], LOS: 2 ADMISSION DATE:  05/01/2018, CONSULTATION DATE:  2/4 REFERRING MD:  Rumball, CHIEF COMPLAINT:  Dyspnea and metabolic acidosis    Brief History   73 year old male patient admitted on 2/3 after falling getting into vehicle.  He has a history of chronic chylous ascites (of uncertain etiology )as well as recurrent left pleural effusion which is also chronic.  Pulmonary asked to evaluate on 2/4 due to worsening shortness of breath, non-anion gap metabolic acidosis, and several severe metabolic derangements, with concern about clinical decline.   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  Significant Hospital Events   2/3 Admitted following a fall. 2/4 Transfer to ICU for resp failure, underwent paracentesis and CRRT initiated    Consults:  Critical care Nephrology  Procedures:   Paracentesis 2/5 >  HD cath 2/5 >  Significant Diagnostic Tests:  Urine studies 2/4: Urine Osmo>> Urine sodium>>>   Micro Data:    Antimicrobials:    Interim history/subjective:    Objective   Blood pressure (!) 77/63, pulse 74, temperature (!) 96.6 F (35.9 C), temperature source Axillary, resp. rate 15, height 5\' 11"  (1.803 m), weight 84.3 kg, SpO2 100 %.        Intake/Output Summary (Last 24 hours) at 04/20/2018 1111 Last data filed at 04/20/2018 1000 Gross per 24 hour  Intake 940.07 ml  Output 5241 ml  Net -4300.93 ml   Filed Weights   04/19/18 1046 04/20/18 0000 04/20/18 0100  Weight: 84.3 kg 84.3 kg 84.3 kg     Examination: Gen:      Frail, chronically ill-appearing. HEENT:  EOMI, sclera anicteric Neck:     No masses; no thyromegaly Lungs:    Regular. CV:         Regular rate and rhythm; no murmurs Abd:      Firm , Distended Ext:    Sarkar, 4+ pitting edema. Skin:      Warm and dry; no rash Neuro: Awake, confused  Resolved Hospital Problem list     Assessment & Plan:  73 year old with multiple medical issues including recurrent chylous ascites of unclear etiology, pleural effusion admitted with acute respiratory failure, AKI, hyperkalemia  Acute respiratory failure: Multifactorial.   Secondary to abdominal distention from ascites, large left pleural effusion. Plan Supplemental oxygen Respiratory status still tenuous after paracentesis Can consider thoracentesis but previous procedure complicated with pneumothorax ex vacuo.   Not sure if it will make a big difference with his breathing.    Discussed with family.  Patient was awake and able to participate in discussion He reiterated that he wants to be full code.  Deferring thoracentesis for now per patient preference. May need to do the procedure if his breathing worsens.  Non-anion gap metabolic acidosis Acute kidney injury, hyperkalemia Hyponatremia Plan Started on CRRT. Follow-up metabolic panel.  Borderline hypotension Plan: He has chronic hypotension Discussed with nephrology.  We will add midodrine.  Elevated LFTs Suspect underlying cirrhosis given presentation Plan: Continue supportive care Consider CT abdomen when stable.   Elevated INR Plan: INR 2.36 now We will start heparin as he has a mechanical mitral valve.  Hold further doses of vitamin K.  Hypotension Plan Continue intermittent IV albumin.  IV fluid maintenance.  History of paroxysmal atrial fibrillation as well as mechanical mitral valve repair Plan Continuing telemetry monitoring Holding Coumadin, start heparin  Severe protein calorie  malnutrition with evidence of failure to thrive including: Falls, pressure ulcers, immobility Plan Palliative care consult Nutritional support as able  Best practice:  Diet: no except meds Pain/Anxiety/Delirium protocol (if indicated): NA VAP protocol (if indicated): NA DVT prophylaxis: Heparin GI prophylaxis: PPI Glucose control: AN Mobility: BR, increase mobility Code Status: full code  Family Communication: Discussed with daughters at bedside. Disposition: ICU  Labs   CBC: Recent Labs  Lab 05/06/2018 1850 04/19/18 0252 04/20/18 0305  WBC 15.0* 10.6* 7.6  NEUTROABS 13.7*  --   --   HGB 10.2* 9.2* 7.3*  HCT 31.7* 29.2* 23.8*  MCV 88.8 87.4 89.8  PLT 486* 401* 253    Basic Metabolic Panel: Recent Labs  Lab 04/26/2018 2341 04/19/18 0252  04/19/18 1202 04/19/18 1526 04/19/18 1832 04/19/18 2133 04/20/18 0305 04/20/18 0945  NA 118* 119*  --  120*  --   --   --  123* 125*  K 6.8* 6.8*   < > 6.4* 6.3* 6.2* 6.0* 5.7* 5.9*  CL 90* 92*  --  91*  --   --   --  91* 96*  CO2 17* 21*  --  16*  --   --   --  20* 19*  GLUCOSE 99 94  --  104*  --   --   --  93 106*  BUN 79* 88*  --  84*  --   --   --  73* 67*  CREATININE 4.54* 4.75*  --  4.78*  --   --   --  4.26* 3.94*  CALCIUM 7.9* 8.0*  --  8.2*  --   --   --  7.7* 7.6*  MG  --  2.6*  --   --   --   --   --   --  2.5*  PHOS  --   --   --   --   --   --   --  5.2* 4.9*   < > = values in this interval not displayed.   GFR: Estimated Creatinine Clearance: 17.5 mL/min (A) (by C-G formula based on SCr of 3.94 mg/dL (H)). Recent Labs  Lab 04/16/2018 1850 04/19/18 0252 04/20/18 0305  WBC 15.0* 10.6* 7.6    Liver Function Tests: Recent Labs  Lab 05/01/2018 1850 04/19/18 0252 04/20/18 0305 04/20/18 0945  AST 99* 87*  --   --   ALT 88* 77*  --   --   ALKPHOS 289* 269*  --   --   BILITOT 0.9 1.2  --   --   PROT 5.2* 4.7*  --   --   ALBUMIN 1.7* 1.4* 2.1* 2.0*   No results for input(s): LIPASE, AMYLASE in the last 168  hours. Recent Labs  Lab 04/19/18 1202  AMMONIA 26    ABG    Component Value Date/Time   PHART 7.412 04/19/2018 0900   PCO2ART 28.9 (L) 04/19/2018 0900   PO2ART 88.4 04/19/2018 0900   HCO3 18.1 (L) 04/19/2018 0900   ACIDBASEDEF 5.7 (H) 04/19/2018 0900   O2SAT 97.3 04/19/2018 0900     Coagulation Profile: Recent Labs  Lab 04/23/2018 1850 04/19/18 0252 04/19/18 1832 04/20/18 0305 04/20/18 0921  INR 6.33* 6.73* 2.24 2.31 2.36  Cardiac Enzymes: No results for input(s): CKTOTAL, CKMB, CKMBINDEX, TROPONINI in the last 168 hours.  HbA1C: Hgb A1c MFr Bld  Date/Time Value Ref Range Status  12/01/2017 04:26 PM 5.7 (H) 4.8 - 5.6 % Final    Comment:    (NOTE)         Prediabetes: 5.7 - 6.4         Diabetes: >6.4         Glycemic control for adults with diabetes: <7.0     CBG: Recent Labs  Lab 04/19/18 1946 04/19/18 2335 04/20/18 0353 04/20/18 0748 04/20/18 0833  GLUCAP 95 83 88 68* 101*   The patient is critically ill with multiple organ system failure and requires high complexity decision making for assessment and support, frequent evaluation and titration of therapies, advanced monitoring, review of radiographic studies and interpretation of complex data.   Critical Care Time devoted to patient care services, exclusive of separately billable procedures, described in this note is 35 minutes.   Marshell Garfinkel MD [facility] Pager [phone] If no answer call [phone] 04/20/2018, 11:34 AM

## 2018-04-20 NOTE — Progress Notes (Signed)
Pt's BP Dropping - Levophed started at 1300. Dr Vaughan Browner  here spoke to pt and family about his wishes and how much care he wants to receive. Patient wants everything done and than said that if he's too sick to make decisions he wants his 2 daughters and his sister to make those decisions for him - Patients  family given blue book for power of health care attorney to read through

## 2018-04-21 ENCOUNTER — Inpatient Hospital Stay (HOSPITAL_COMMUNITY): Payer: Medicare Other

## 2018-04-21 ENCOUNTER — Other Ambulatory Visit: Payer: Medicare Other

## 2018-04-21 ENCOUNTER — Inpatient Hospital Stay: Payer: Medicare Other | Admitting: Hematology

## 2018-04-21 DIAGNOSIS — Z515 Encounter for palliative care: Secondary | ICD-10-CM

## 2018-04-21 LAB — CBC
HCT: 27.5 % — ABNORMAL LOW (ref 39.0–52.0)
Hemoglobin: 8.4 g/dL — ABNORMAL LOW (ref 13.0–17.0)
MCH: 27.6 pg (ref 26.0–34.0)
MCHC: 30.5 g/dL (ref 30.0–36.0)
MCV: 90.5 fL (ref 80.0–100.0)
PLATELETS: 383 10*3/uL (ref 150–400)
RBC: 3.04 MIL/uL — ABNORMAL LOW (ref 4.22–5.81)
RDW: 16.4 % — ABNORMAL HIGH (ref 11.5–15.5)
WBC: 7.9 10*3/uL (ref 4.0–10.5)
nRBC: 0 % (ref 0.0–0.2)

## 2018-04-21 LAB — RENAL FUNCTION PANEL
ALBUMIN: 1.7 g/dL — AB (ref 3.5–5.0)
Albumin: 1.9 g/dL — ABNORMAL LOW (ref 3.5–5.0)
Albumin: 1.7 g/dL — ABNORMAL LOW (ref 3.5–5.0)
Anion gap: 10 (ref 5–15)
Anion gap: 12 (ref 5–15)
Anion gap: 14 (ref 5–15)
BUN: 48 mg/dL — ABNORMAL HIGH (ref 8–23)
BUN: 35 mg/dL — ABNORMAL HIGH (ref 8–23)
BUN: 39 mg/dL — ABNORMAL HIGH (ref 8–23)
CO2: 22 mmol/L (ref 22–32)
CO2: 24 mmol/L (ref 22–32)
CO2: 19 mmol/L — ABNORMAL LOW (ref 22–32)
CREATININE: 2.53 mg/dL — AB (ref 0.61–1.24)
Calcium: 7.5 mg/dL — ABNORMAL LOW (ref 8.9–10.3)
Calcium: 7.5 mg/dL — ABNORMAL LOW (ref 8.9–10.3)
Calcium: 7.2 mg/dL — ABNORMAL LOW (ref 8.9–10.3)
Chloride: 96 mmol/L — ABNORMAL LOW (ref 98–111)
Chloride: 95 mmol/L — ABNORMAL LOW (ref 98–111)
Chloride: 95 mmol/L — ABNORMAL LOW (ref 98–111)
Creatinine, Ser: 3 mg/dL — ABNORMAL HIGH (ref 0.61–1.24)
Creatinine, Ser: 2.29 mg/dL — ABNORMAL HIGH (ref 0.61–1.24)
GFR calc Af Amer: 23 mL/min — ABNORMAL LOW (ref >60)
GFR calc Af Amer: 31 mL/min — ABNORMAL LOW (ref >60)
GFR calc Af Amer: 28 mL/min — ABNORMAL LOW (ref >60)
GFR calc non Af Amer: 20 mL/min — ABNORMAL LOW (ref >60)
GFR calc non Af Amer: 27 mL/min — ABNORMAL LOW (ref >60)
GFR calc non Af Amer: 24 mL/min — ABNORMAL LOW (ref >60)
GLUCOSE: 183 mg/dL — AB (ref 70–99)
Glucose, Bld: 158 mg/dL — ABNORMAL HIGH (ref 70–99)
Glucose, Bld: 182 mg/dL — ABNORMAL HIGH (ref 70–99)
POTASSIUM: 4.7 mmol/L (ref 3.5–5.1)
Phosphorus: 4.2 mg/dL (ref 2.5–4.6)
Phosphorus: 3 mg/dL (ref 2.5–4.6)
Phosphorus: 3.4 mg/dL (ref 2.5–4.6)
Potassium: 3.5 mmol/L (ref 3.5–5.1)
Potassium: 4.2 mmol/L (ref 3.5–5.1)
Sodium: 128 mmol/L — ABNORMAL LOW (ref 135–145)
Sodium: 131 mmol/L — ABNORMAL LOW (ref 135–145)
Sodium: 128 mmol/L — ABNORMAL LOW (ref 135–145)

## 2018-04-21 LAB — COMPREHENSIVE METABOLIC PANEL
ALT: 48 U/L — ABNORMAL HIGH (ref 0–44)
AST: 67 U/L — ABNORMAL HIGH (ref 15–41)
Albumin: 1.8 g/dL — ABNORMAL LOW (ref 3.5–5.0)
Alkaline Phosphatase: 203 U/L — ABNORMAL HIGH (ref 38–126)
Anion gap: 9 (ref 5–15)
BUN: 46 mg/dL — ABNORMAL HIGH (ref 8–23)
CO2: 22 mmol/L (ref 22–32)
Calcium: 7.4 mg/dL — ABNORMAL LOW (ref 8.9–10.3)
Chloride: 98 mmol/L (ref 98–111)
Creatinine, Ser: 2.9 mg/dL — ABNORMAL HIGH (ref 0.61–1.24)
GFR calc non Af Amer: 20 mL/min — ABNORMAL LOW (ref >60)
GFR, EST AFRICAN AMERICAN: 24 mL/min — AB (ref >60)
Glucose, Bld: 162 mg/dL — ABNORMAL HIGH (ref 70–99)
Potassium: 4.4 mmol/L (ref 3.5–5.1)
Sodium: 129 mmol/L — ABNORMAL LOW (ref 135–145)
Total Bilirubin: 1.1 mg/dL (ref 0.3–1.2)
Total Protein: 5 g/dL — ABNORMAL LOW (ref 6.5–8.1)

## 2018-04-21 LAB — BLOOD GAS, ARTERIAL
ACID-BASE DEFICIT: 1.4 mmol/L (ref 0.0–2.0)
Bicarbonate: 23 mmol/L (ref 20.0–28.0)
Drawn by: 511471
O2 Content: 3 L/min
O2 Saturation: 97.2 %
PH ART: 7.388 (ref 7.350–7.450)
Patient temperature: 96.9
pCO2 arterial: 38.5 mmHg (ref 32.0–48.0)
pO2, Arterial: 80 mmHg — ABNORMAL LOW (ref 83.0–108.0)

## 2018-04-21 LAB — POTASSIUM
Potassium: 4.4 mmol/L (ref 3.5–5.1)
Potassium: 4.4 mmol/L (ref 3.5–5.1)

## 2018-04-21 LAB — TRIGLYCERIDES, BODY FLUIDS: Triglycerides, Fluid: 791 mg/dL

## 2018-04-21 LAB — PROTIME-INR
INR: 2.8
Prothrombin Time: 29.1 seconds — ABNORMAL HIGH (ref 11.4–15.2)

## 2018-04-21 LAB — PHOSPHORUS: Phosphorus: 3.9 mg/dL (ref 2.5–4.6)

## 2018-04-21 LAB — HEPARIN LEVEL (UNFRACTIONATED): Heparin Unfractionated: <0.1 IU/mL — ABNORMAL LOW (ref 0.30–0.70)

## 2018-04-21 LAB — GLUCOSE, CAPILLARY
Glucose-Capillary: 143 mg/dL — ABNORMAL HIGH (ref 70–99)
Glucose-Capillary: 153 mg/dL — ABNORMAL HIGH (ref 70–99)
Glucose-Capillary: 166 mg/dL — ABNORMAL HIGH (ref 70–99)
Glucose-Capillary: 167 mg/dL — ABNORMAL HIGH (ref 70–99)

## 2018-04-21 LAB — MAGNESIUM: Magnesium: 2.4 mg/dL (ref 1.7–2.4)

## 2018-04-21 MED ORDER — SODIUM CHLORIDE 0.9% FLUSH: 10.0000 mL | Freq: Two times a day (BID) | INTRAVENOUS | Status: DC

## 2018-04-21 MED ORDER — MORPHINE 100MG IN NS 100ML (1MG/ML) PREMIX INFUSION
0.0000 mg/h | INTRAVENOUS | Status: DC
  Administered 2018-04-21: 1 mg/h via INTRAVENOUS
  Filled 2018-04-21: qty 100

## 2018-04-21 MED ORDER — MORPHINE SULFATE (PF) 2 MG/ML IV SOLN: 2.0000 mg | INTRAVENOUS | Status: DC | PRN

## 2018-04-21 MED ORDER — ONDANSETRON HCL 4 MG/2ML IJ SOLN: 4.0000 mg | Freq: Four times a day (QID) | INTRAMUSCULAR | Status: DC | PRN

## 2018-04-21 MED ORDER — ONDANSETRON 4 MG PO TBDP: 4.0000 mg | ORAL_TABLET | Freq: Four times a day (QID) | ORAL | Status: DC | PRN

## 2018-04-21 MED ORDER — ACETAMINOPHEN 325 MG PO TABS: 650.0000 mg | ORAL_TABLET | Freq: Four times a day (QID) | ORAL | Status: DC | PRN

## 2018-04-21 MED ORDER — GLYCOPYRROLATE 1 MG PO TABS: 1.0000 mg | ORAL_TABLET | ORAL | Status: DC | PRN

## 2018-04-21 MED ORDER — POLYVINYL ALCOHOL 1.4 % OP SOLN
1.0000 [drp] | Freq: Four times a day (QID) | OPHTHALMIC | Status: DC | PRN
  Filled 2018-04-21: qty 15

## 2018-04-21 MED ORDER — SODIUM CHLORIDE 0.9 % IV SOLN
INTRAVENOUS | Status: DC | PRN
  Administered 2018-04-21: 500 mL via INTRAVENOUS

## 2018-04-21 MED ORDER — SODIUM CHLORIDE 0.9% FLUSH: 10.0000 mL | INTRAVENOUS | Status: DC | PRN

## 2018-04-21 MED ORDER — DIPHENHYDRAMINE HCL 50 MG/ML IJ SOLN: 25.0000 mg | INTRAMUSCULAR | Status: DC | PRN

## 2018-04-21 MED ORDER — HEPARIN SODIUM (PORCINE) 1000 UNIT/ML DIALYSIS
1000.0000 [IU] | INTRAMUSCULAR | Status: DC | PRN
  Administered 2018-04-21: 2000 [IU] via INTRAVENOUS_CENTRAL
  Filled 2018-04-21 (×2): qty 6

## 2018-04-21 MED ORDER — MORPHINE BOLUS VIA INFUSION
5.0000 mg | INTRAVENOUS | Status: DC | PRN
  Administered 2018-04-21: 5 mg via INTRAVENOUS
  Filled 2018-04-21: qty 5

## 2018-04-21 MED ORDER — CHLORHEXIDINE GLUCONATE CLOTH 2 % EX PADS
6.0000 | MEDICATED_PAD | Freq: Every day | CUTANEOUS | Status: DC
  Administered 2018-04-21: 6 via TOPICAL

## 2018-04-21 MED ORDER — GLYCOPYRROLATE 0.2 MG/ML IJ SOLN: 0.2000 mg | INTRAMUSCULAR | Status: DC | PRN

## 2018-04-21 MED ORDER — MIDAZOLAM HCL 2 MG/2ML IJ SOLN: 2.0000 mg | INTRAMUSCULAR | Status: DC | PRN

## 2018-04-21 MED ORDER — LORAZEPAM 2 MG/ML IJ SOLN: 1.0000 mg | INTRAMUSCULAR | Status: DC | PRN

## 2018-04-21 MED ORDER — ACETAMINOPHEN 650 MG RE SUPP: 650.0000 mg | Freq: Four times a day (QID) | RECTAL | Status: DC | PRN

## 2018-04-22 ENCOUNTER — Encounter: Payer: Self-pay | Admitting: Hematology

## 2018-04-23 LAB — BODY FLUID CULTURE: CULTURE: NO GROWTH

## 2018-04-25 ENCOUNTER — Telehealth: Payer: Self-pay

## 2018-04-25 NOTE — Telephone Encounter (Signed)
On 04/25/2018 Received dc (original copy) from [employer]. DC will be taken to Pulmonary Unit ([place]) for signature.

## 2018-04-26 ENCOUNTER — Telehealth: Payer: Self-pay

## 2018-04-26 NOTE — Telephone Encounter (Signed)
Received dc back from [facility] to let them know dc was ready for pickup. Also faxed a copy to funeral home per funeral home request.

## 2018-05-15 NOTE — Progress Notes (Signed)
NAME:  Mario Powell, MRN:  [id number], DOB:  11-16-1945, LOS: 3 ADMISSION DATE:  05/12/2018, CONSULTATION DATE:  2/4 REFERRING MD:  Rumball, CHIEF COMPLAINT:  Dyspnea and metabolic acidosis    Brief History   73 year old male patient admitted on 2/3 after falling getting into vehicle.  He has a history of chronic chylous ascites (of uncertain etiology )as well as recurrent left pleural effusion which is also chronic.  Pulmonary asked to evaluate on 2/4 due to worsening shortness of breath, non-anion gap metabolic acidosis, and several severe metabolic derangements, with concern about clinical decline.   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  Significant Hospital Events   2/3 Admitted following a fall. 2/4 Transfer to ICU for resp failure, underwent paracentesis and CRRT initiated    Consults:  Critical care Nephrology Palliative Medicine   Procedures:   Paracentesis 2/5 >  HD cath 2/5 >  Significant Diagnostic Tests:  Urine studies 2/4: Urine Osmo>> 352 Urine sodium>>> <10   Micro Data:   Peritoneal fluid 2/4> no growth at 2 days MRSA 2/4> neg Antimicrobials:    Interim history/subjective:   NAEO  Hypotensive    Objective   Blood pressure (!) 99/59, pulse 86, temperature (!) 97.5 F (36.4 C), temperature source Axillary, resp. rate 15, height 5\' 11"  (1.803 m), weight 79.4 kg, SpO2 100 %.        Intake/Output Summary (Last 24 hours) at 27-Apr-2018 1133 Last data filed at Apr 27, 2018 1100 Gross per 24 hour  Intake 2197.69 ml  Output 2415 ml  Net -217.31 ml    Filed Weights   04/20/18 0000 04/20/18 0100 2018-04-27 0400  Weight: 84.3 kg 84.3 kg 79.4 kg    Examination: Gen:      Critically ill appearing male, NAD HEENT:  NCAT, PERRL, mmm  Lungs:    Absent L Lung sounds, right sided basilar crackles  CV:         RRR no r/g/m Abd:      Flat, tense, normoactive x4 Ext:    BUE BLE non-pitting edema  Skin:      Clean, dry, warm, intact  Neuro: Awake, alert, interactive. Follows commands   Resolved Hospital Problem list     Assessment & Plan:  73 year old with multiple medical issues including recurrent chylous ascites of unclear etiology, pleural effusion admitted with acute respiratory failure, AKI, hyperkalemia  Acute respiratory failure: Multifactorial.   -large L sided pleural effusion, abdominal distention secondary to ascites  -s/p paracentesis  Plan Patient does NOT want thoracentesis  Supplemental oxygen PRN Palliative consult per family request   Non-anion gap metabolic acidosis Acute kidney injury, hyperkalemia Hyponatremia Plan Nephrology following Continuing CRRT at this time  Hypotension Plan: He has chronic hypotension Midodrine  Norepi titrated for MAPs > 65   Ascites, Elevated LFTs -possible underlying hepatorenal syndrome -s/p paracentesis Plan: Pt does not want further paracentesis    History of paroxysmal atrial fibrillation as well as mechanical mitral valve repair Plan Continuing telemetry monitoring  Severe protein calorie malnutrition with evidence of failure to thrive including: Falls, pressure ulcers, immobility Plan Palliative care consult Does not want Feeding tube at this time   Goals  of Care -long discussion with daughters, sister, patient RE clinical decline and very likely poor outcome. Patient and family understand severity of illness. At present, the patient does NOT want interventions such as thoracentesis or repeat paracentesis. The patient WANTS CRRT and continued pressors at this  time.  -The patient remains DNR/I in event of arrest.  -Palliative care to be consulted at request of family to aid in symptom management if patient were to develop respiratory distress   Best practice:  Diet: NPO  Pain/Anxiety/Delirium protocol (if indicated): NA VAP protocol (if indicated): NA DVT prophylaxis: scd GI prophylaxis: PPI Glucose control: AN Mobility: BR, increase mobility Code Status: DNR  Family Communication: Daughters and sister at bedside   Disposition: ICU  Labs   CBC: Recent Labs  Lab 04/30/2018 1850 04/19/18 0252 04/20/18 0305 May 04, 2018 0602  WBC 15.0* 10.6* 7.6 7.9  NEUTROABS 13.7*  --   --   --   HGB 10.2* 9.2* 7.3* 8.4*  HCT 31.7* 29.2* 23.8* 27.5*  MCV 88.8 87.4 89.8 90.5  PLT 486* 401* 305 914    Basic Metabolic Panel: Recent Labs  Lab 04/19/18 0252  04/20/18 0945 04/20/18 1305 04/20/18 2006 04/20/18 2255 2018/05/04 0228 2018/05/04 0602 05/04/2018 0946  NA 119*   < > 125* 127* 127* 128* 128* 129*  --   K 6.8*   < > 5.9* 5.9* 5.3*  5.3* 5.3* 4.7 4.4 4.4  CL 92*   < > 96* 98 94* 97* 96* 98  --   CO2 21*   < > 19* 19* 20* 21* 22 22  --   GLUCOSE 94   < > 106* 107* 122* 132* 158* 162*  --   BUN 88*   < > 67* 61* 54* 53* 48* 46*  --   CREATININE 4.75*   < > 3.94* 3.71* 3.29* 3.16* 3.00* 2.90*  --   CALCIUM 8.0*   < > 7.6* 7.8* 7.7* 7.6* 7.5* 7.4*  --   MG 2.6*  --  2.5*  --   --   --   --  2.4  --   PHOS  --    < > 4.9* 4.7* 4.6 4.4 4.2 3.9  --    < > = values in this interval not displayed.   GFR: Estimated Creatinine Clearance: 23.8 mL/min (A) (by C-G formula based on SCr of 2.9 mg/dL (H)). Recent Labs  Lab 04/20/2018 1850 04/19/18 0252 04/20/18 0305 May 04, 2018 0602  WBC 15.0* 10.6* 7.6 7.9    Liver Function Tests: Recent Labs  Lab 05/07/2018 1850 04/19/18 0252  04/20/18 1305 04/20/18 2006 04/20/18 2255 2018/05/04 0228 05-04-18 0602  AST 99* 87*  --   --   --   --   --  67*  ALT 88* 77*  --   --   --   --   --  48*  ALKPHOS 289*  269*  --   --   --   --   --  203*  BILITOT 0.9 1.2  --   --   --   --   --  1.1  PROT 5.2* 4.7*  --   --   --   --   --  5.0*  ALBUMIN 1.7* 1.4*   < > 2.1* 2.0* 1.9* 1.9* 1.8*   < > = values in this interval not displayed.   No results for input(s): LIPASE, AMYLASE in the last 168 hours. Recent Labs  Lab 04/19/18 1202  AMMONIA 26  ABG    Component Value Date/Time   PHART 7.388 05-08-18 0400   PCO2ART 38.5 05/08/18 0400   PO2ART 80.0 (L) 05/08/18 0400   HCO3 23.0 05/08/2018 0400   ACIDBASEDEF 1.4 May 08, 2018 0400   O2SAT 97.2 05/08/2018 0400     Coagulation Profile: Recent Labs  Lab 04/19/18 0252 04/19/18 1832 04/20/18 0305 04/20/18 0921 May 08, 2018 0602  INR 6.73* 2.24 2.31 2.36 2.80    Cardiac Enzymes: No results for input(s): CKTOTAL, CKMB, CKMBINDEX, TROPONINI in the last 168 hours.  HbA1C: Hgb A1c MFr Bld  Date/Time Value Ref Range Status  12/01/2017 04:26 PM 5.7 (H) 4.8 - 5.6 % Final    Comment:    (NOTE)         Prediabetes: 5.7 - 6.4         Diabetes: >6.4         Glycemic control for adults with diabetes: <7.0     CBG: Recent Labs  Lab 04/20/18 1502 04/20/18 1946 04/20/18 2356 2018-05-08 0350 05/08/2018 0809  GLUCAP 90 115* 143* 153* 167*   Critical Care Time: 40 minutes    Eliseo Gum MSN, AGACNP-BC [place] 2018/05/08, 11:33 AM

## 2018-05-15 NOTE — Discharge Summary (Signed)
[facility] Death Summary  Patient name: Mario Powell Medical record number: [id number] Date of birth: Jul 15, 1945 Age: 73 y.o. Gender: male Date of Admission: 2018-05-06  Date of Death: May 09, 2018 Admitting Physician: Dickie La, MD  Primary Care Provider: Wilber Oliphant, MD  Indication for Hospitalization: fall, dyspnea  Discharge Diagnoses/Problem List:  Acute respiratory failure Non anion gap  Met. Acidosis AKI Hyperkalemia hypotension LFT Paroxysmal afib Protein calorie malnutrition  Brief Hospital Course:  [name] came to the ED following a fall from his Jeep earlier that day.  In the ED he was found to be in respiratory distress with a metabolic acidosis, and significant ascites and pleural effusion.  Imaging did not show any fractures or acute head injuries secondary to his fall.  He was admitted for the electrolyte disturbances and the respiratory distress.  For his hyperkalemia calcium gluconate was given, along with lokelma, insulin and albuterol to lower his potassium levels.  These measures did not have a significant effect on his hyperkalemia. No EKG abnormalities were seen. He was admitted to the ICU the following morning, where they started CRRT to improve his electrolyte imbalance. A therapeutic paracentesis was performed in which 4.5L were taken off, but this did not improve his respiratory status.  Patient declined thoracentesis.  On 2/6, [name] status continued to decline.  CRRT was stopped and he was transitioned to comfort care.  Supplemental O2 was stopped while PRN and morphine drip was started. He died peacefully at 47pm on 05-10-2022.     Benay Pike, MD 05/09/18, 9:30 PM PGY-1, [place]

## 2018-05-15 NOTE — Progress Notes (Signed)
   NAME:  Mario Powell, MRN:  [id number], DOB:  Aug 15, 1945, LOS: 3 ADMISSION DATE:  05/08/2018, CONSULTATION DATE:  2/4 REFERRING MD:  Rumball, CHIEF COMPLAINT:  Dyspnea and metabolic acidosis    Brief History   73 year old male patient admitted on 2/3 after falling getting into vehicle.  He has a history of chronic chylous ascites (of uncertain etiology )as well as recurrent left pleural effusion which is also chronic.  Pulmonary asked to evaluate on 2/4 due to worsening shortness of breath, non-anion gap metabolic acidosis, and several severe metabolic derangements, with concern about clinical decline.    Goals of Care: Went to bedside after approached by RN who reported family and patient expressing desire to transition to comfort care measures. Dr. Vaughan Browner and myself went to the patient's room. [name] family, including sister and 2 adult daughter's, were present at the bedside. The patient was awake and alert. We provided the patient his hearing aids. Dr. Vaughan Browner asked the patient if he would like to stop current interventions (CRRT, pressors). In no uncertain terms the patient stated, " Pull the plug."  PCCM confirmed again that the patient wished to transition to comfort care measures, asking if he wanted to stop everything else and be comfortable, to which he nodded Yes.   [name] daughter inquired about palliative care and about medicines to ease pain and anxiety. PCCM assured the patient that we would put in medication orders to facilitate comfort care. The family asked the patient if he would like to wait for another family member to arrive later today, to which he replied, " No."  At this time, we will transition the patient's care to comfort care. We will stop CRRT and vasoactive medications. We will place an order for a titrateable morphine infusion and PRN Versed for anxiety and dyspnea. We will discontinue labs, imaging, and medications not geared toward comfort.       Eliseo Gum MSN, AGACNP-BC [facility] 2018-05-15, 3:37 PM

## 2018-05-15 NOTE — Progress Notes (Signed)
Pt expressed that he wants to pull the plug and stop all treatment - 2 daughters and his older sister here. - Awaitng palliative care  consult - Critical care here - went and spoke to patient and family orders received

## 2018-05-15 NOTE — Progress Notes (Signed)
Morphine gtt at 7 mg - pt appears  comfortable - family removed 02 at this time - pt is not responsive HR 91 RR 14 - last BP 66/42 informed family that if he looks like he is struggling to breath Morphine gtt can be increased - all said he looks comfortable now than waved off nurse - 5 family members present at bedside holding the patients hand and sitting next to the bed. Given tissues and comfort tray with drinks and snacks. Michela Pitcher they would call me if they need me. Swabs for patients mouth at bedside with fresh water

## 2018-05-15 NOTE — Consult Note (Signed)
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 We saw him on a previous admission 03/04/18 to initiate goals of care. His condition has deteriorated rapidly and the patient has made a decision to discontinue all aggressive medical interventions and allow for a natural death to occur.    Clinical Assessment and Goals of Care: Patient is transitioning to comfort care- CRRT has been discontinued, all meds not related to comfort have been discontinued.   His daughters and sisters are at his bedside.  Morphine infusion at 2mg /h with 4mg  bolus prn and titration as needed for symptoms. I discussed removing all lines and tubing including his O2 nasal cannula.  SUMMARY OF RECOMMENDATIONS    Full comfort care  Code Status/Advance Care Planning:  DNR    Symptom Management:   Morphine Infusion w/bolus  PRN ativan  All other palliative terminal care prn orders  Palliative Prophylaxis:   Aspiration, Frequent Pain Assessment and Turn Reposition  Additional Recommendations (Limitations, Scope, Preferences):  Full Comfort Care  Psycho-social/Spiritual:   Desire for further Chaplaincy support:yes  Additional Recommendations: Grief/Bereavement Support  Prognosis:   Hours - Days  Discharge Planning: Anticipated Hospital Death      Primary Diagnoses: Present on Admission: . Shortness of breath . Pressure ulcer   I have reviewed the medical record, interviewed the patient and family, and examined the patient. The following  aspects are pertinent.  Past Medical History:  Diagnosis Date  . Anasarca 12/01/2017  . Atrial fibrillation ([place]) 07/12/2013   Perioperative, 2001   . CAD (coronary artery disease) of artery bypass graft 07/12/2013   Saphenous vein graft 2001   . Chronic anticoagulation 07/12/2013  . Essential hypertension 07/12/2013  . History of mitral valve replacement with mechanical valve 01/11/2013   Mechanical mitral valve 2001  Bacterial endocarditis as the cause   . HOH (hard of hearing)   . Prostate cancer ([place]) 07/12/2013  . Renal insufficiency    Social History   Socioeconomic History  . Marital status: Widowed    Spouse name: Not on file  . Number of children: Not on file  . Years of education: Not on file  . Highest education level: Not on file  Occupational History  . Not on file  Social Needs  . Financial resource strain: Not on file  . Food insecurity:    Worry: Not on file    Inability: Not on file  . Transportation needs:    Medical: Not on file    Non-medical: Not on file  Tobacco Use  . Smoking status: Former Research scientist (life sciences)  . Smokeless tobacco: Never Used  . Tobacco comment: QUIT IN 09/1982  Substance and Sexual  Activity  . Alcohol use: Not Currently    Comment: NO ALCOHOL SINCE CHRISTMAS 2017  . Drug use: Never  . Sexual activity: Not on file  Lifestyle  . Physical activity:    Days per week: Not on file    Minutes per session: Not on file  . Stress: Not on file  Relationships  . Social connections:    Talks on phone: Not on file    Gets together: Not on file    Attends religious service: Not on file    Active member of club or organization: Not on file    Attends meetings of clubs or organizations: Not on file    Relationship status: Not on file  Other Topics Concern  . Not on file  Social History Narrative   Daughters Judson Roch and Sharyn Lull   Works for [employer], mainly at desk all day.    Performs all ADLs   Family History  Problem Relation Age of Onset  . Heart  attack Father   . Healthy Mother   . Healthy Sister   . Healthy Sister    Scheduled Meds: . mouth rinse  15 mL Mouth Rinse BID   Continuous Infusions: . sodium chloride 500 mL (2018/05/21 1639)  . morphine 2 mg/hr (2018-05-21 1710)   PRN Meds:.sodium chloride, acetaminophen **OR** acetaminophen, diphenhydrAMINE, glycopyrrolate **OR** glycopyrrolate **OR** glycopyrrolate, heparin, midazolam, morphine injection, morphine, naphazoline-pheniramine, ondansetron **OR** ondansetron (ZOFRAN) IV, polyvinyl alcohol Medications Prior to Admission:  Prior to Admission medications   Medication Sig Start Date End Date Taking? Authorizing Provider  Amino Acids-Protein Hydrolys (FEEDING SUPPLEMENT, PRO-STAT SUGAR FREE 64,) LIQD Take 30 mLs by mouth 3 (three) times daily with meals. Patient taking differently: Take 30 mLs by mouth every evening.  12/09/17  Yes Wilber Oliphant, MD  calcium-vitamin D (OSCAL WITH D) 500-200 MG-UNIT tablet Take 1 tablet by mouth daily.    Yes [provider]  Camphor-Eucalyptus-Menthol (VICKS VAPORUB EX) Apply 1 application topically See admin instructions. Apply under nose daily in the evening   Yes [provider]  Cholecalciferol (VITAMIN D-3) 25 MCG (1000 UT) CAPS Take 1,000 Units by mouth daily.   Yes [provider]  ezetimibe (ZETIA) 10 MG tablet Take 1 tablet (10 mg total) by mouth daily. PLEASE CALL TO SCHEDULE F/U APPT FOR FUTURE REFILLS.Marland Kitchen3RD AND FINAL ATTEMPT! 02/25/18  Yes Belva Crome, MD  medium chain triglycerides (MCT OIL) oil Take 15 mLs by mouth 3 (three) times daily with meals. Patient taking differently: Take 15 mLs by mouth at bedtime.  12/08/17  Yes Wilber Oliphant, MD  Multiple Vitamin (MULTIVITAMIN WITH MINERALS) TABS tablet Take 1 tablet by mouth daily. 12/09/17  Yes Wilber Oliphant, MD  Naphazoline-Pheniramine (OPCON-A) 0.027-0.315 % SOLN Place 2-3 drops into both eyes daily as needed (for dry eyes).   Yes [provider]    pantoprazole (PROTONIX) 40 MG tablet Take 1 tablet (40 mg total) by mouth 2 (two) times daily. 03/22/18  Yes Meccariello, Bernita Raisin, DO  rosuvastatin (CRESTOR) 40 MG tablet Take 1 tablet (40 mg total) by mouth daily. PLEASE CALL TO SCHEDULE F/U APPT FOR FUTURE REFILLS.Marland Kitchen3RD AND FINAL ATTEMPT! 02/25/18  Yes Belva Crome, MD  warfarin (COUMADIN) 1 MG tablet TAKE AS [place] Patient taking differently: Take 0.5-1 mg by mouth See admin instructions. Take 0.5 mg by mouth daily on Monday, Wednesday. Take 1 mg by mouth daily on all other days. 01/24/18  Yes Belva Crome, MD  Allergies  Allergen Reactions  . Penicillins Other (See Comments)    DID THE REACTION INVOLVE: Swelling of the face/tongue/throat, SOB, or low BP? Unknown Sudden or severe rash/hives, skin peeling, or the inside of the mouth or nose? Unknown Did it require medical treatment? Yes When did it last happen?When pt was 73 years old Pt knows that his parents had to take him to the ER at the hospital and has not been retested with any PCN or cephalosporin that he is aware of.   Review of Systems  Physical Exam  Vital Signs: BP (!) 102/58   Pulse 90   Temp (!) 97.4 F (36.3 C) (Axillary)   Resp (!) 28   Ht 5\' 11"  (1.803 m)   Wt 79.4 kg   SpO2 100%   BMI 24.41 kg/m  Pain Scale: 0-10   Pain Score: Asleep   SpO2: SpO2: 100 % O2 Device:SpO2: 100 % O2 Flow Rate: .O2 Flow Rate (L/min): 3 L/min  IO: Intake/output summary:   Intake/Output Summary (Last 24 hours) at 04/30/2018 1723 Last data filed at 30-Apr-2018 1600 Gross per 24 hour  Intake 2415.96 ml  Output 2727 ml  Net -311.04 ml    LBM: Last BM Date: 04/17/18 Baseline Weight: Weight: 87.3 kg Most recent weight: Weight: 79.4 kg     Palliative Assessment/Data:    Time Total: 45 min Greater than 50%  of this time was spent counseling and coordinating care related to the above assessment and plan.  Signed by: Lane Hacker, DO    Please contact Palliative Medicine Team phone at [phone] for questions and concerns.  For individual provider: See Shea Evans

## 2018-05-15 NOTE — Progress Notes (Signed)
Stopped CRRT - gave back all blood. Pt tolerated procedure well. D/C Levophed and started Morphine gtt - Family at bedside Pt able to verbalize that he is comfortable

## 2018-05-15 NOTE — Progress Notes (Signed)
[facility] Progress Note    Assessment/ Plan:   1. Progressive renal injury: given ascites, increasing frequency of paractenesis, borderline low Bps, this really looks like hepatorenal syndrome based on physiologic picture.  The etiology of ascites is yet unknown.  Nephrogenic ascites is usually not chylous.  UPEP recently with large K/L light chain ratio but without definitive Bence-Jones protein.  ?  Atypical presentation of amyloid, his UP/C doesn't reflect nephrotic range proteinuria at this point so don't think a kidney biopsy would be helpful here (or possible really given the circumstances).  He is experiencing a clinical decline and is not likely to survive.  Discussed with pt's dtrs.  They appear to have a realistic grasp and are considering transition to comfort care.  Recommend pall care c/s to help with this transition.  Will continue CRRT for now while they make decisions-- expect to discontinue soon.  2.  Hyponatremia and hyperkalemia: TSH a little high, rec levothyroxine.  AM cortisol OK.  CRRT and plan as above  3.  Chylous ascites: s/p 5L paracentesis.  Extensive workup with negative results so far.  Lymphangiogram was being considered.  There are case reports of somatostatin improving chylous ascites in post-op pts but this isn't exactly what we are dealing with.    4.  Hypotension: not on pressor yet, adding midodrine today.  5.  Dispo: poor prognosis it looks like    Subjective:    Continues to clinically decline.  Long talk with dtrs today- likely transition to comfort care soon, undecided about whether or not to do thoracentesis (not sure it would be offered?).       Objective:   BP (!) 99/59   Pulse 86   Temp (!) 97.5 F (36.4 C) (Axillary) Comment (Src): Rt Axilla   Resp 15   Ht 5\' 11"  (1.803 m)   Wt 79.4 kg   SpO2 100%   BMI 24.41 kg/m   Intake/Output Summary (Last 24 hours) at 05-10-18 1120 Last data filed at 10-May-2018 1100 Gross per 24  hour  Intake 2197.69 ml  Output 2415 ml  Net -217.31 ml   Weight change: -4.9 kg  Physical Exam: GEN frail and ill-appearing, sleeping, not really arousable HEENT EOMI PERRL wearing glasses, scrape on side of nose, temporal wasting NECK + JVD to angle of the mandible PULM muffled L > R CV tachycardic, + S3 ABD distended, + umbilical hernia, + fluid wave EXT 3+ pitting LE edema NEURO AAO x 3 no asterixis SKIN: mult ecchymoses, some mild jaundice  Imaging: Dg Chest Port 1 View  Result Date: 2018-05-10 CLINICAL DATA:  Acute respiratory failure with shortness of breath EXAM: PORTABLE CHEST 1 VIEW COMPARISON:  Two days ago FINDINGS: Right IJ line with tip at the SVC. Much of the heart is obscured by white out of the left chest. There is mass effect with rightward mediastinal shift. Postoperative heart with mitral valve repair. CABG. Stable interstitial coarsening on the right at least partially from atelectasis. IMPRESSION: Large left pleural effusion with continued white out and mediastinal shift. Electronically Signed   By: Monte Fantasia M.D.   On: 2018/05/10 06:55   Dg Chest Port 1 View  Result Date: 04/19/2018 CLINICAL DATA:  73 year old male central line placement. Status post fall 2 days ago. History of ascites and left-side pleural effusion. EXAM: PORTABLE CHEST 1 VIEW COMPARISON:  04/29/2018 chest radiographs and earlier. FINDINGS: Portable AP semi upright view at 2202 hours. Right side dual lumen central venous  catheter has been placed and the tip projects at the lower SVC level below the carina. The left pleural effusion which was large on 05/12/2018 now completely opacifies the left hemithorax. Mild rightward shift of the mediastinum is stable. The right lung remains clear. Chronic cardiac valve replacement redemonstrated. No acute osseous abnormality identified. IMPRESSION: 1. Dual lumen right IJ central line placed with tip at the lower SVC level. 2. Progressed large left pleural  effusion since yesterday, now completely opacifying the left hemithorax. Stable mild rightward mass effect on the mediastinum. 3. No right lung pneumothorax or acute pulmonary opacity. Electronically Signed   By: Genevie Ann M.D.   On: 04/19/2018 22:43    Labs: BMET Recent Labs  Lab 04/20/18 0305 04/20/18 0945 04/20/18 1305 04/20/18 2006 04/20/18 2255 05-15-2018 0228 05/15/18 0602 May 15, 2018 0946  NA 123* 125* 127* 127* 128* 128* 129*  --   K 5.7* 5.9* 5.9* 5.3*  5.3* 5.3* 4.7 4.4 4.4  CL 91* 96* 98 94* 97* 96* 98  --   CO2 20* 19* 19* 20* 21* 22 22  --   GLUCOSE 93 106* 107* 122* 132* 158* 162*  --   BUN 73* 67* 61* 54* 53* 48* 46*  --   CREATININE 4.26* 3.94* 3.71* 3.29* 3.16* 3.00* 2.90*  --   CALCIUM 7.7* 7.6* 7.8* 7.7* 7.6* 7.5* 7.4*  --   PHOS 5.2* 4.9* 4.7* 4.6 4.4 4.2 3.9  --    CBC Recent Labs  Lab 04/29/2018 1850 04/19/18 0252 04/20/18 0305 May 15, 2018 0602  WBC 15.0* 10.6* 7.6 7.9  NEUTROABS 13.7*  --   --   --   HGB 10.2* 9.2* 7.3* 8.4*  HCT 31.7* 29.2* 23.8* 27.5*  MCV 88.8 87.4 89.8 90.5  PLT 486* 401* 305 383    Medications:    . calcium-vitamin D  1 tablet Oral Daily  . Chlorhexidine Gluconate Cloth  6 each Topical Daily  . cholecalciferol  1,000 Units Oral Daily  . medium chain triglycerides  15 mL Oral QHS  . mouth rinse  15 mL Mouth Rinse BID  . midodrine  10 mg Oral TID WC  . pantoprazole (PROTONIX) IV  40 mg Intravenous Q12H  . sodium bicarbonate  1,300 mg Oral TID  . sodium chloride flush  10-40 mL Intracatheter Q12H  . sodium zirconium cyclosilicate  10 g Oral BID      Madelon Lips MD [facility]  pgr [phone] 05/15/18, 11:20 AM

## 2018-05-15 NOTE — Progress Notes (Signed)
Dahlia Bailiff, RN and Loma Boston, RN wasted 46mL of morphine in the sink.  Clint Bolder, RN 04/22/18 12:20 AM

## 2018-05-15 NOTE — Progress Notes (Signed)
Family practice teaching service appreciates the excellent care that Mario Powell is receiving.  We are following along with his clinical course and are ready to receive him when deemed appropriate.

## 2018-05-15 DEATH — deceased

## 2019-07-26 IMAGING — US US RENAL
1 series · 14 of 19 positions shown · non-contrast
Comparison: CT 11/22/2006

CLINICAL DATA: Acute kidney injury

EXAM:
RENAL / URINARY TRACT ULTRASOUND COMPLETE

[Series 1: us renal · 0.28mm/px · 14 of 19 slices shown]
[im 1/19]
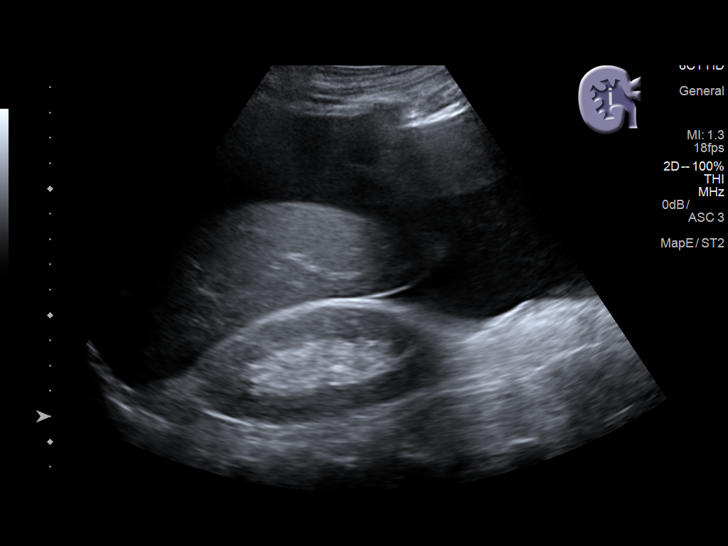
[im 3/19]
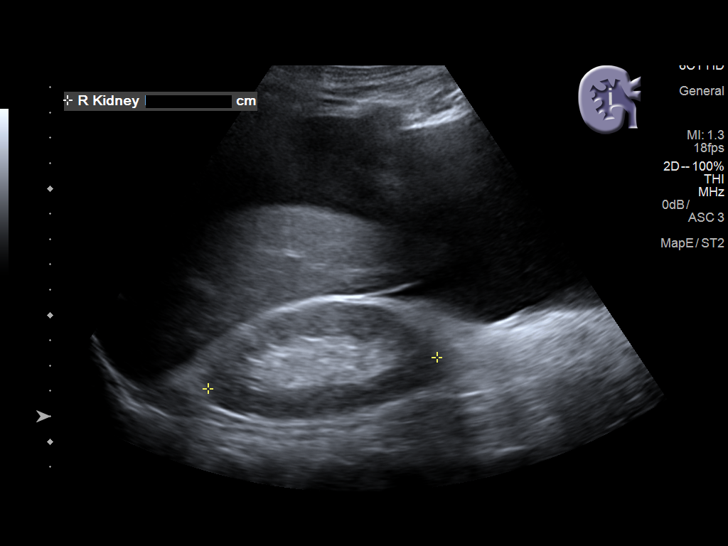
[im 4/19]
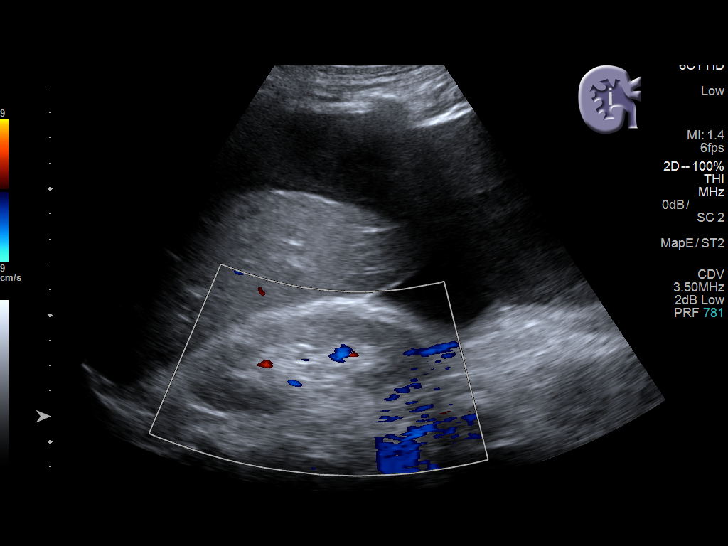
[im 5/19]
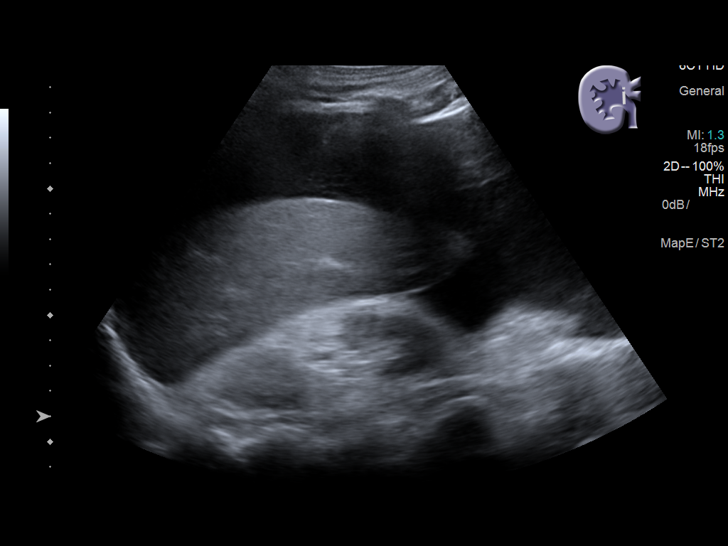
[im 7/19]
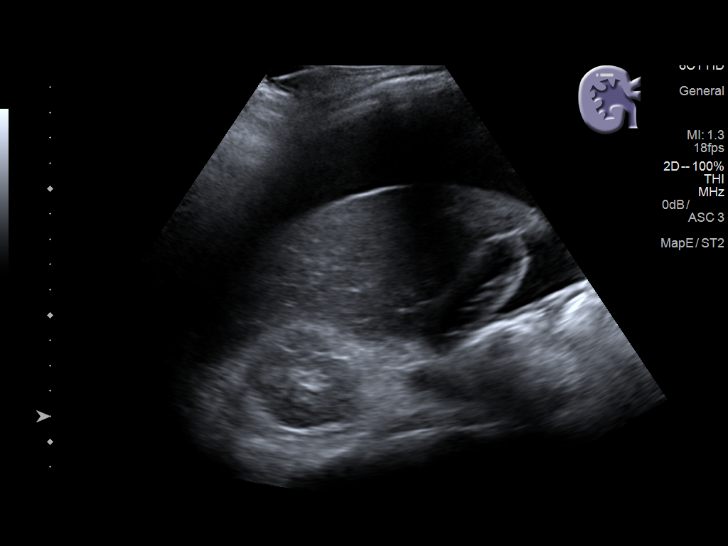
[im 8/19]
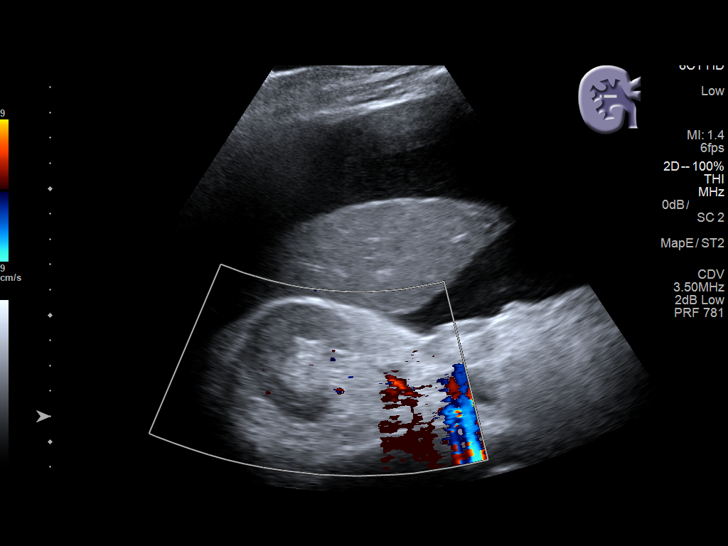
[im 9/19]
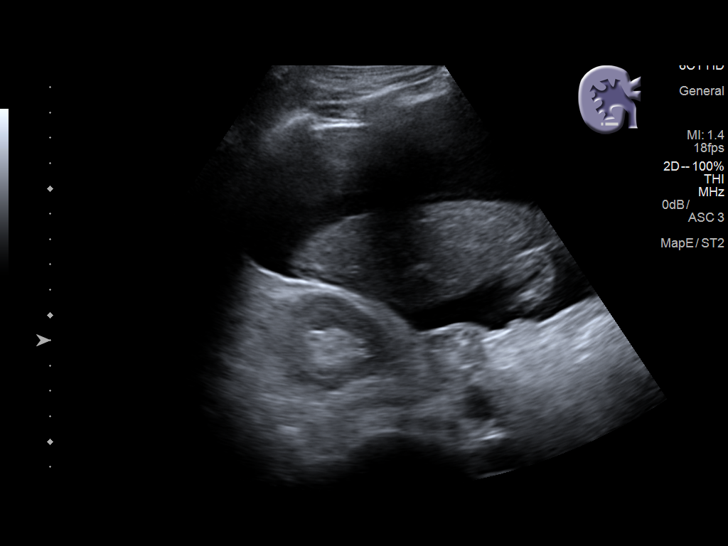
[im 11/19]
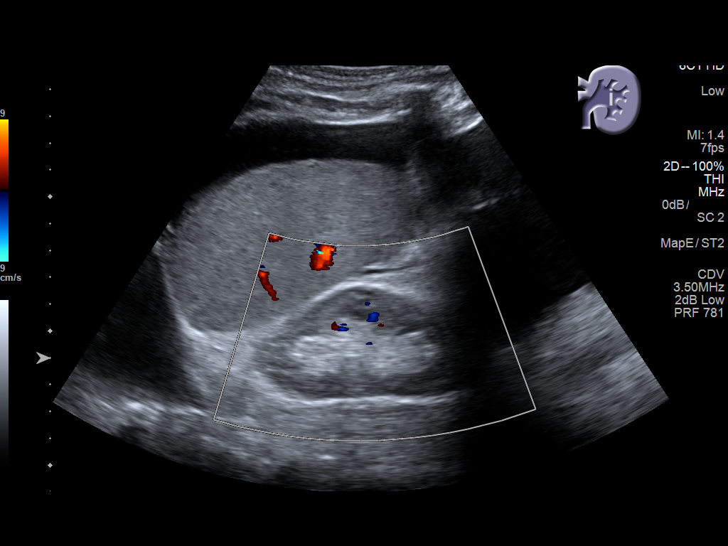
[im 12/19]
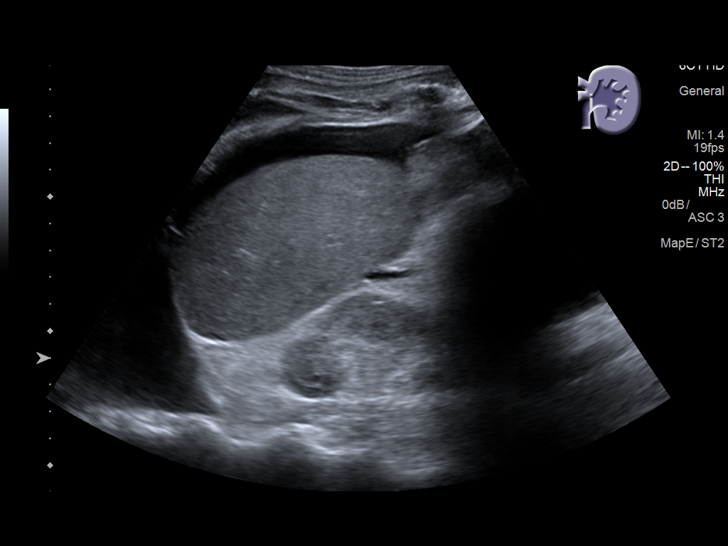
[im 13/19]
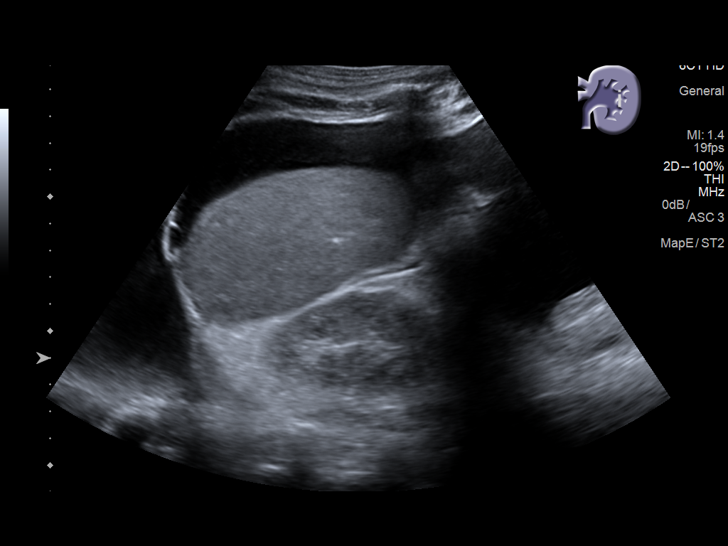
[im 15/19]
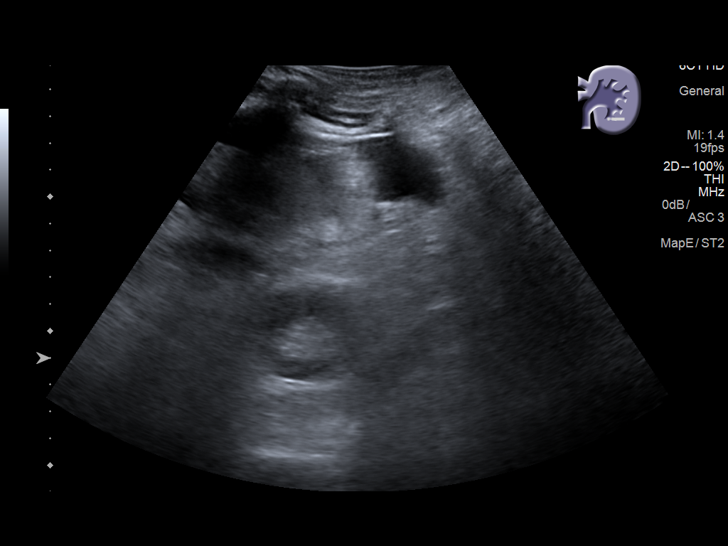
[im 16/19]
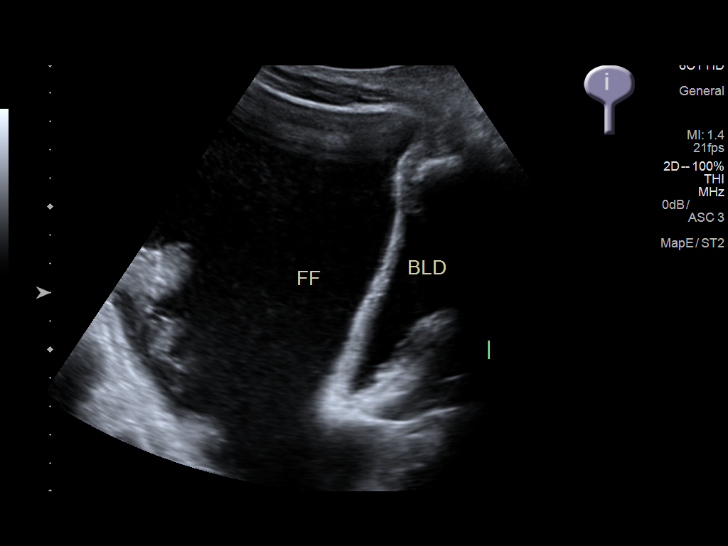
[im 17/19]
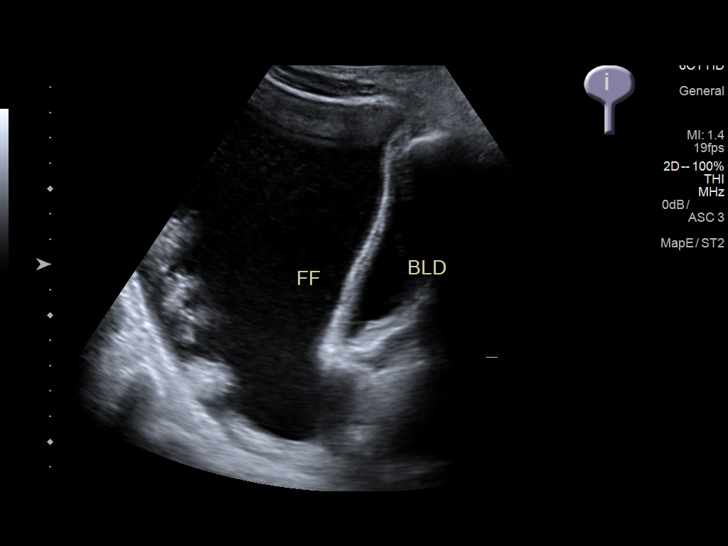
[im 19/19]
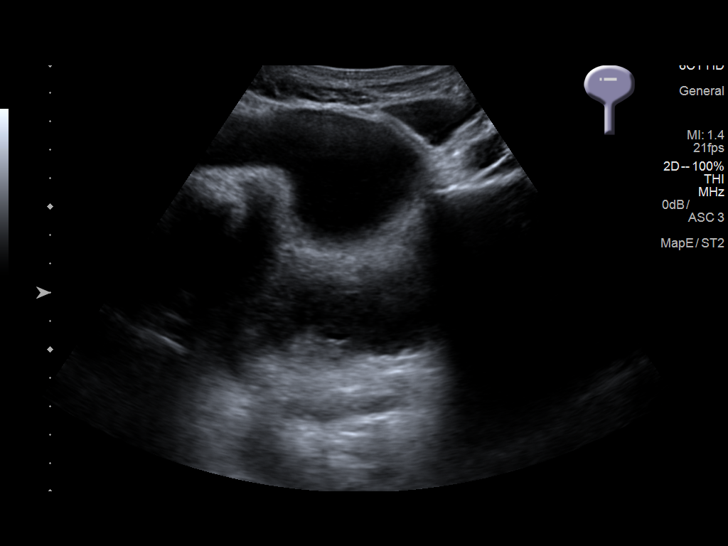

[14 of 19 positions shown; findings below may reference images not displayed]

FINDINGS: Right Kidney:

Length: 9.2 cm. Echogenicity within normal limits. No mass or
hydronephrosis visualized.

Left Kidney:

Length: 7.4 cm. Echogenicity within normal limits. No mass or
hydronephrosis visualized.

Bladder:

Appears normal for degree of bladder distention. Moderate to large
amount of free fluid in the abdomen. Incidental note made of left
pleural effusion.
IMPRESSION: 1. Ultrasound appearance of the kidneys is within normal limits
2. Moderate to large amount of ascites in the abdomen. Left pleural
effusion.

## 2019-07-27 IMAGING — US US ABDOMEN LIMITED
1 series · 14 of 25 positions shown · non-contrast
Comparison: 12/01/2017

CLINICAL DATA: Distension with ascites

EXAM:
ULTRASOUND ABDOMEN LIMITED RIGHT UPPER QUADRANT

[Series 1: us abdomen limited · 0.28mm/px · 14 of 49 slices shown]
[im 1/49]
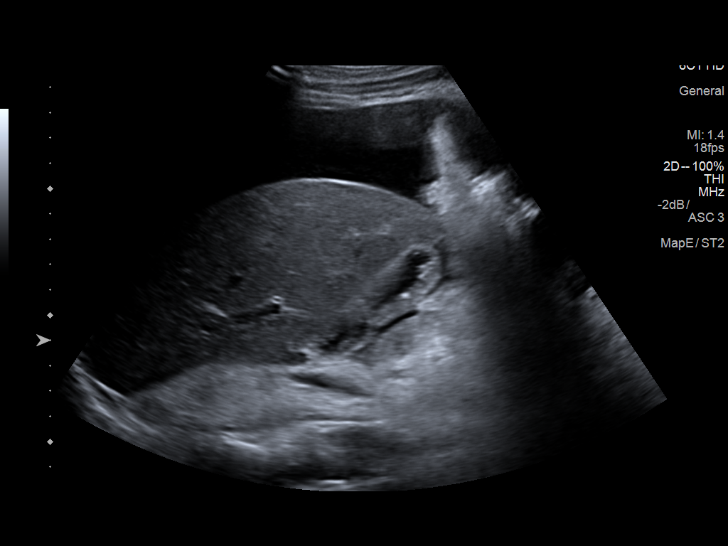
[im 5/49]
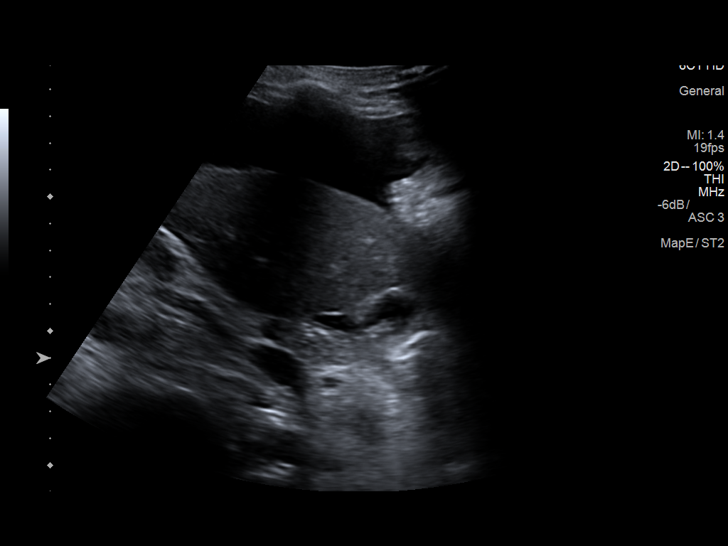
[im 9/49]
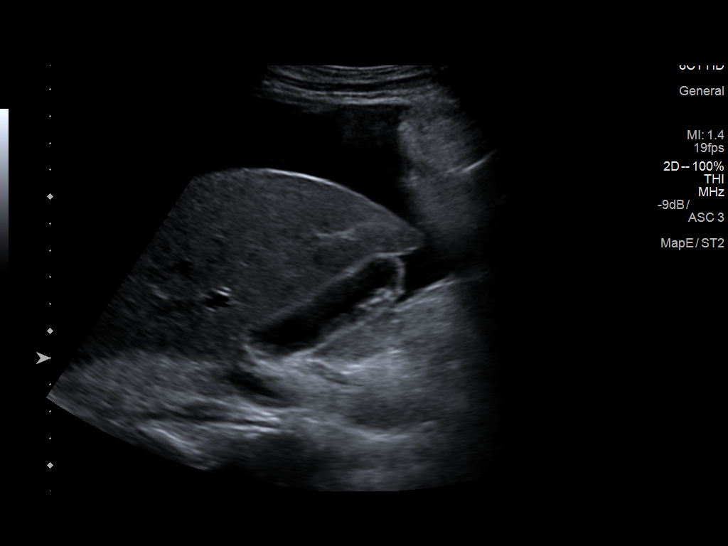
[im 13/49]
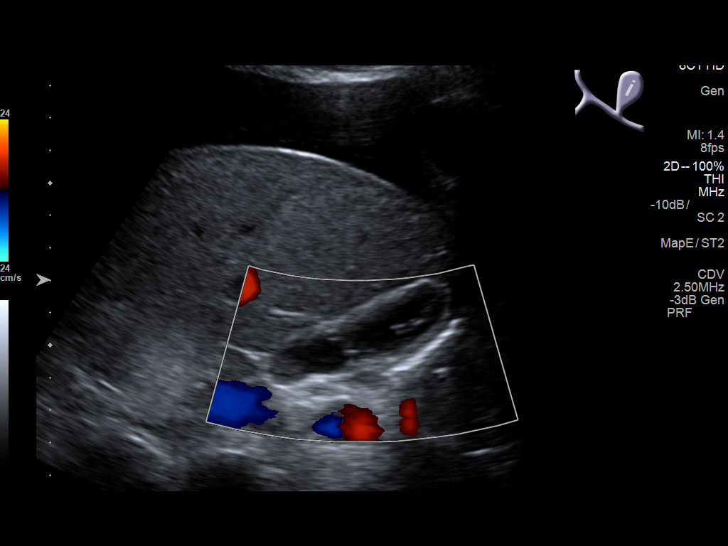
[im 17/49]
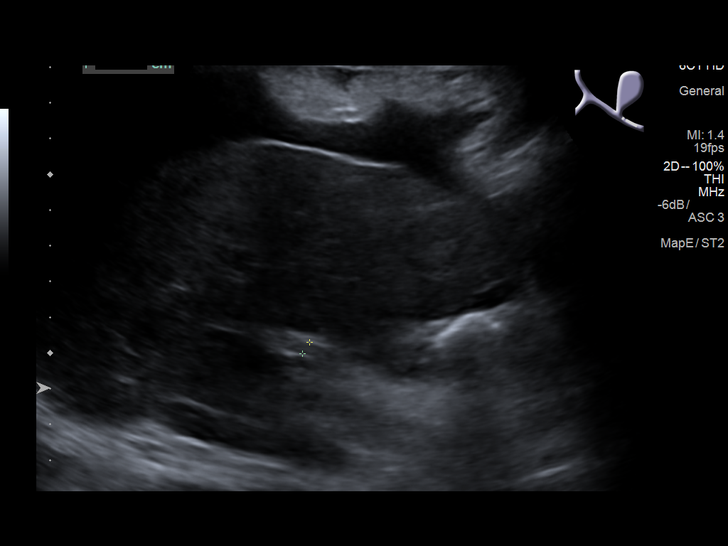
[im 19/49]
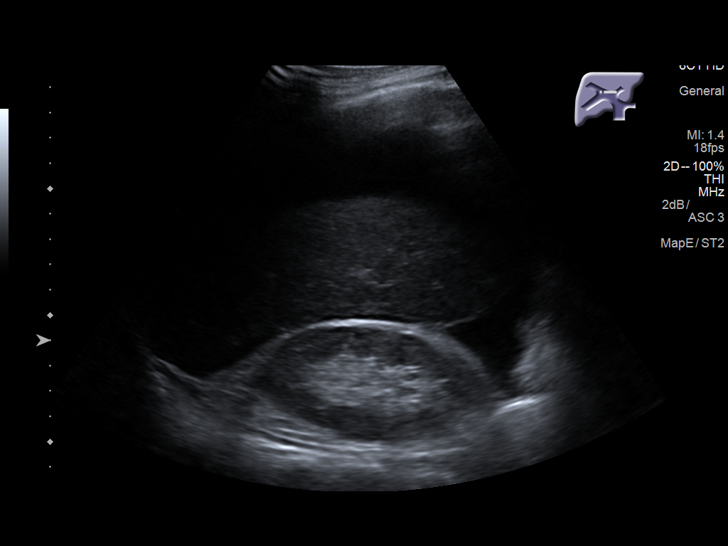
[im 23/49]
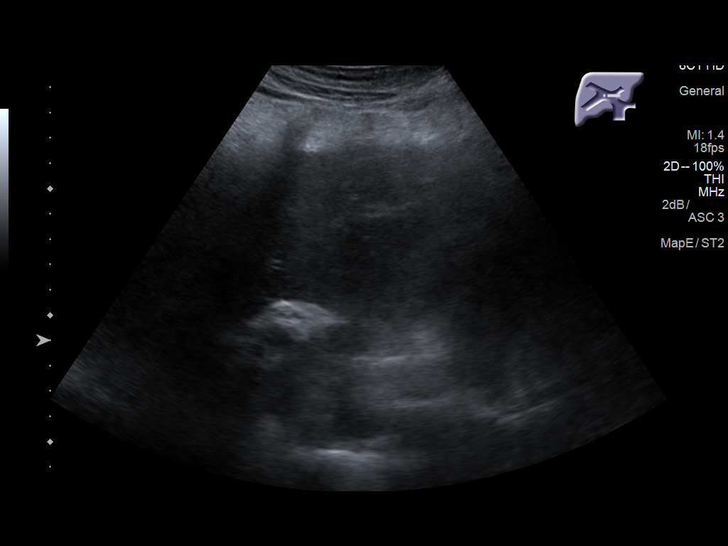
[im 27/49]
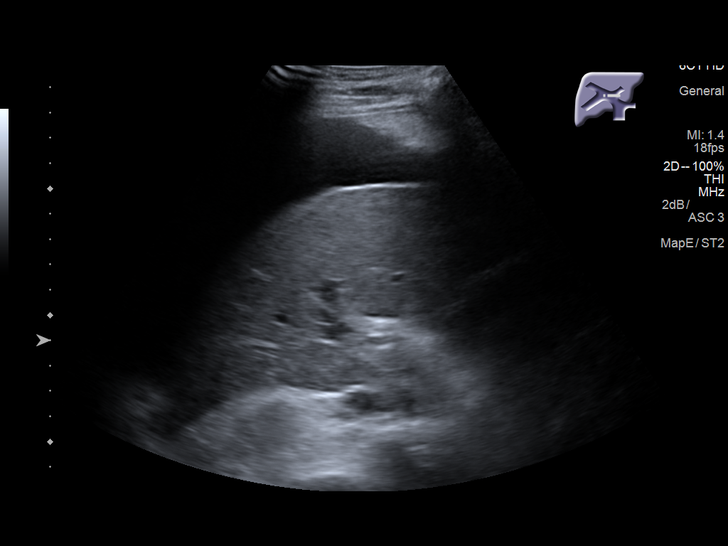
[im 31/49]
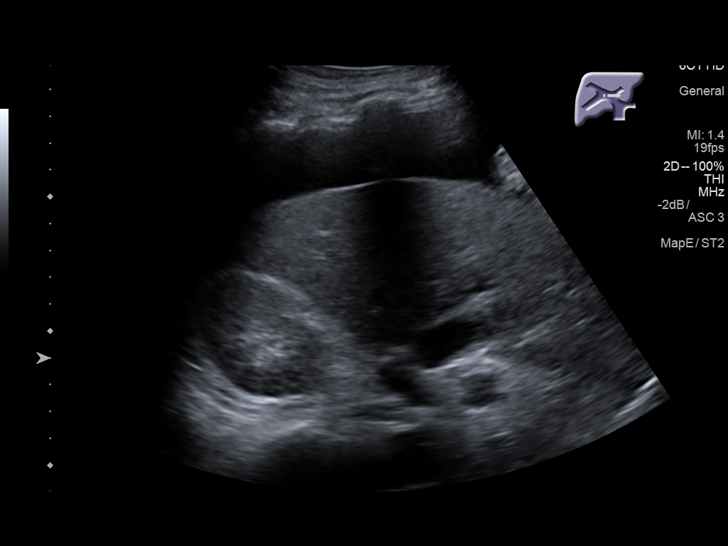
[im 33/49]
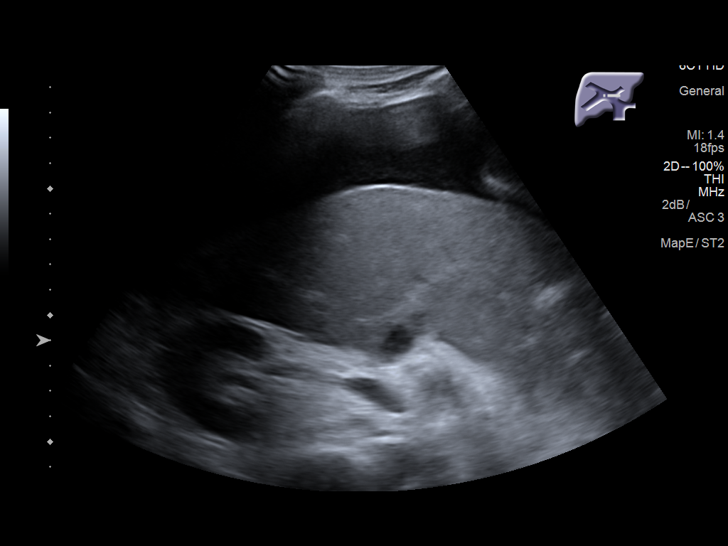
[im 37/49]
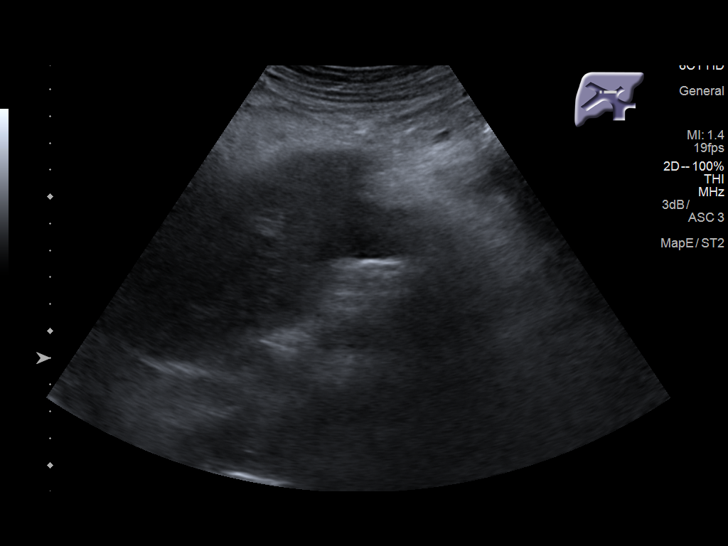
[im 41/49]
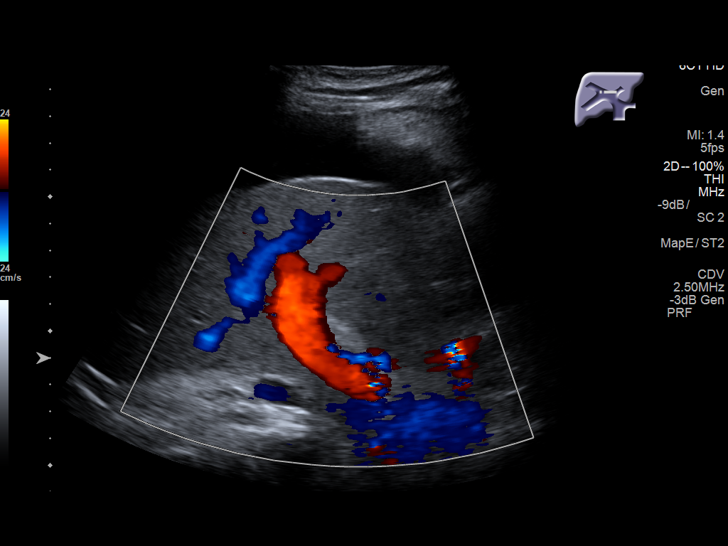
[im 45/49]
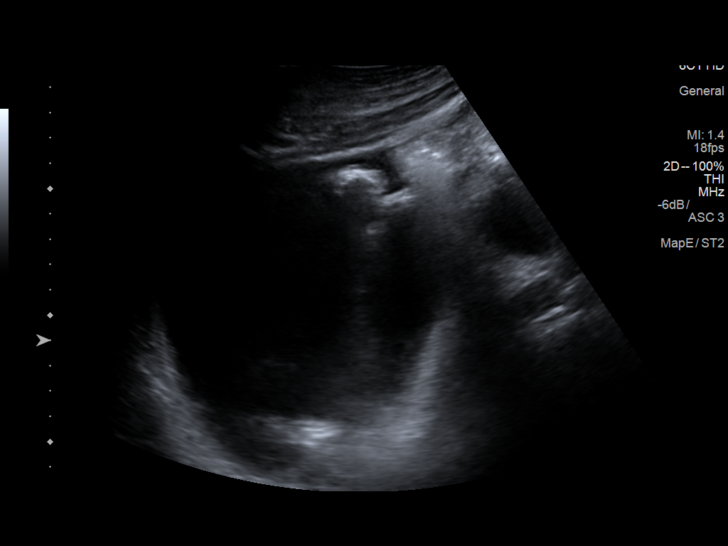
[im 49/49]
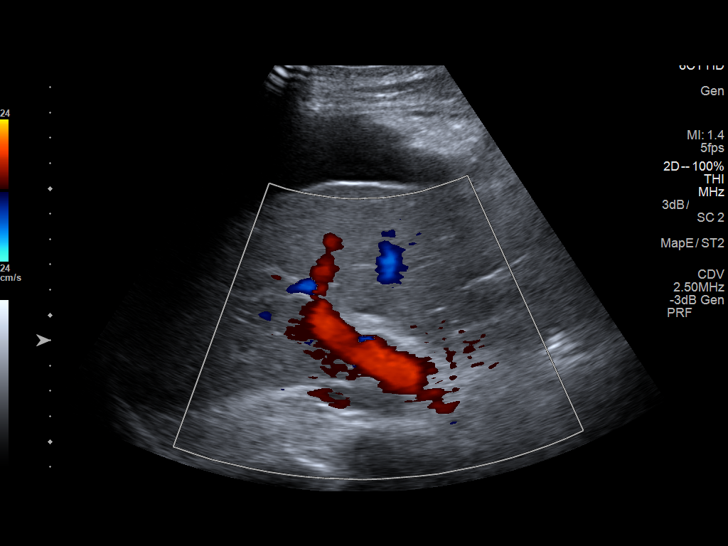

[14 of 25 positions shown; findings below may reference images not displayed]

FINDINGS: Gallbladder:

Small stones measuring up to 3 mm. Normal wall thickness. Negative
sonographic Murphy

Common bile duct:

Diameter: 3.8 mm

Liver:

Subtle contour nodularity of the liver. No focal hepatic
abnormality. Portal vein is patent on color Doppler imaging with
normal direction of blood flow towards the liver. Small moderate
ascites
IMPRESSION: 1. Cholelithiasis without sonographic evidence for cholecystitis or
biliary dilatation
2. Slight contour nodularity of the liver suggesting cirrhosis
3. Small to moderate ascites

## 2020-04-08 IMAGING — US IR PARACENTESIS
2 series · 5 of 5 positions shown · non-contrast
Comparison: none

INDICATION: Patient with abdominal distention, found to have ascites. Request is
made for diagnostic and therapeutic paracentesis. Due to renal
insufficiency will limit removal to 5 liters today.

[Series 1: ir (id) (id)/(id)/(id) ir · 3 of 3 slices shown (1 of 2)]
[im 1/3]
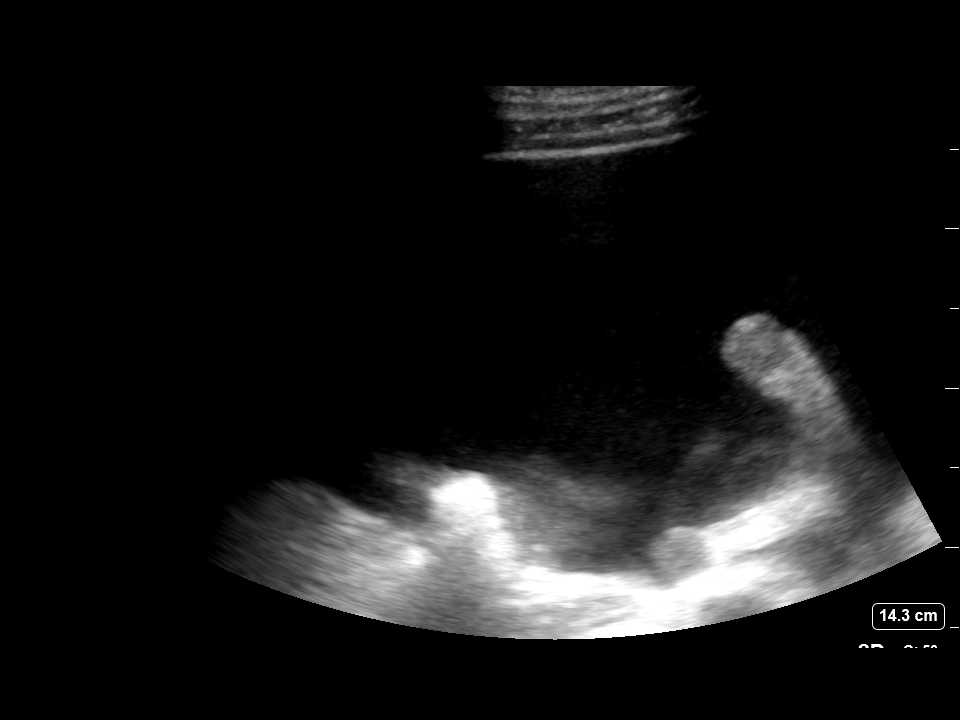
[im 2/3]
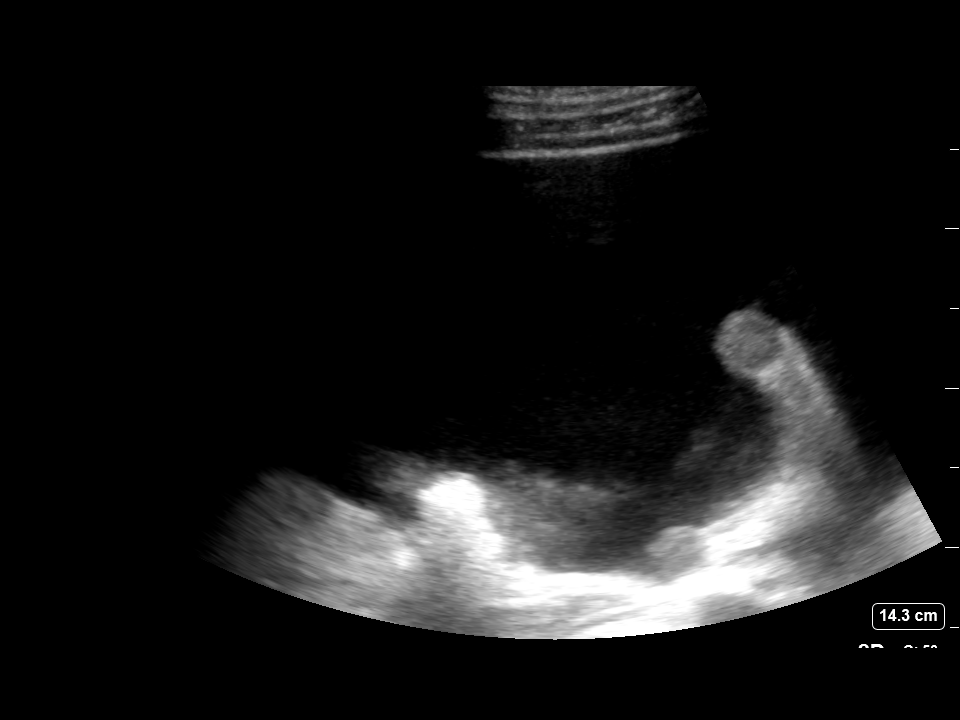
[im 3/3]
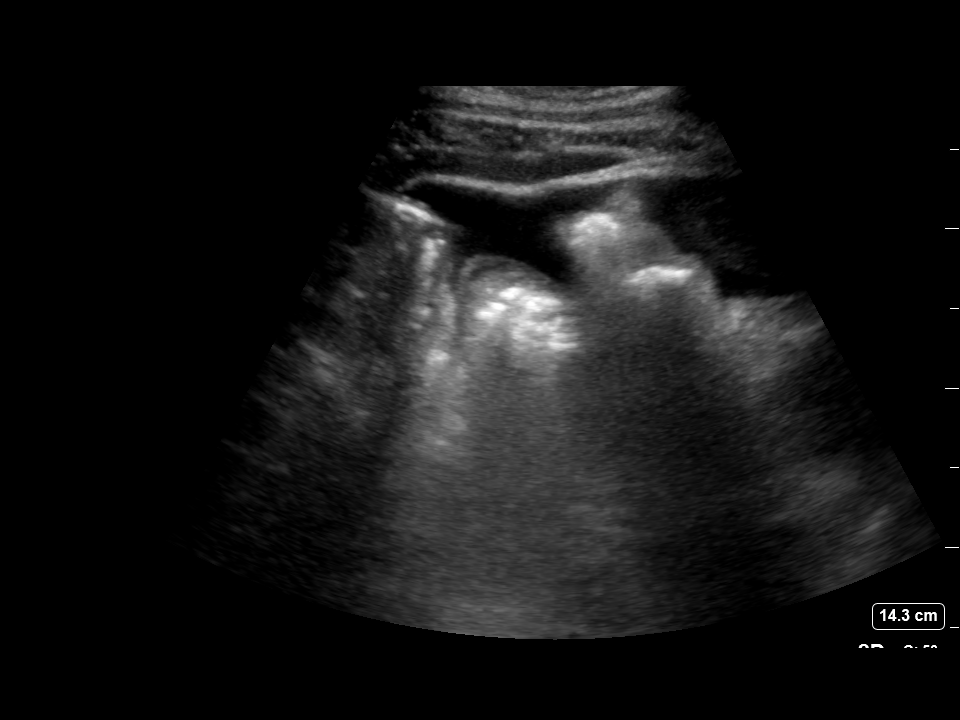

[Series 1: ir (id) (id)/(id)/(id) ir · 2 of 2 slices shown (2 of 2)]
[im 1/2]
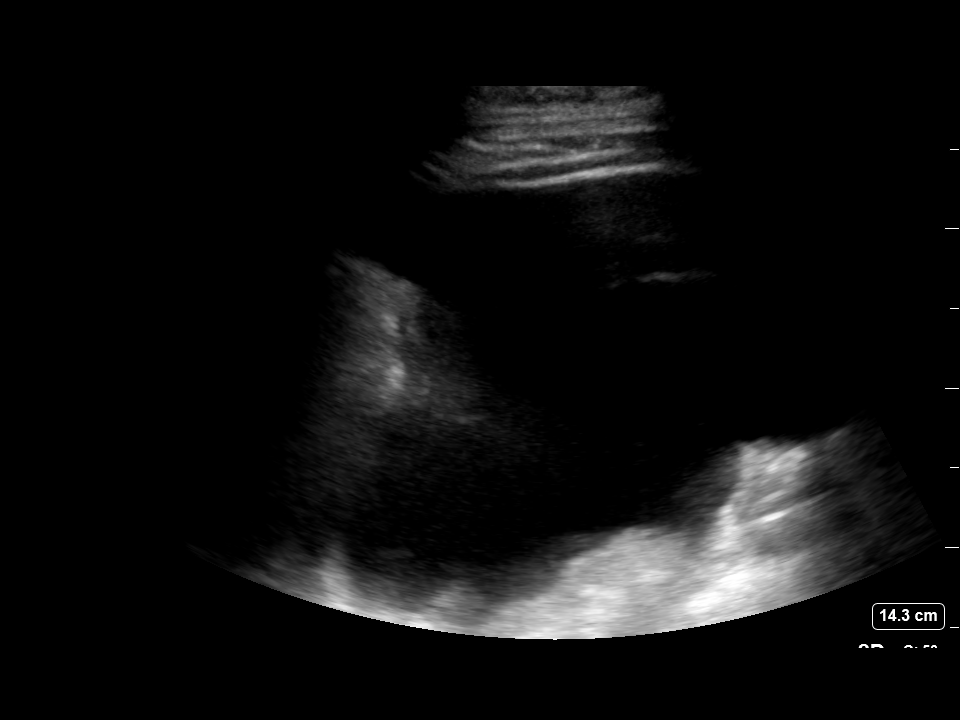
[im 2/2]
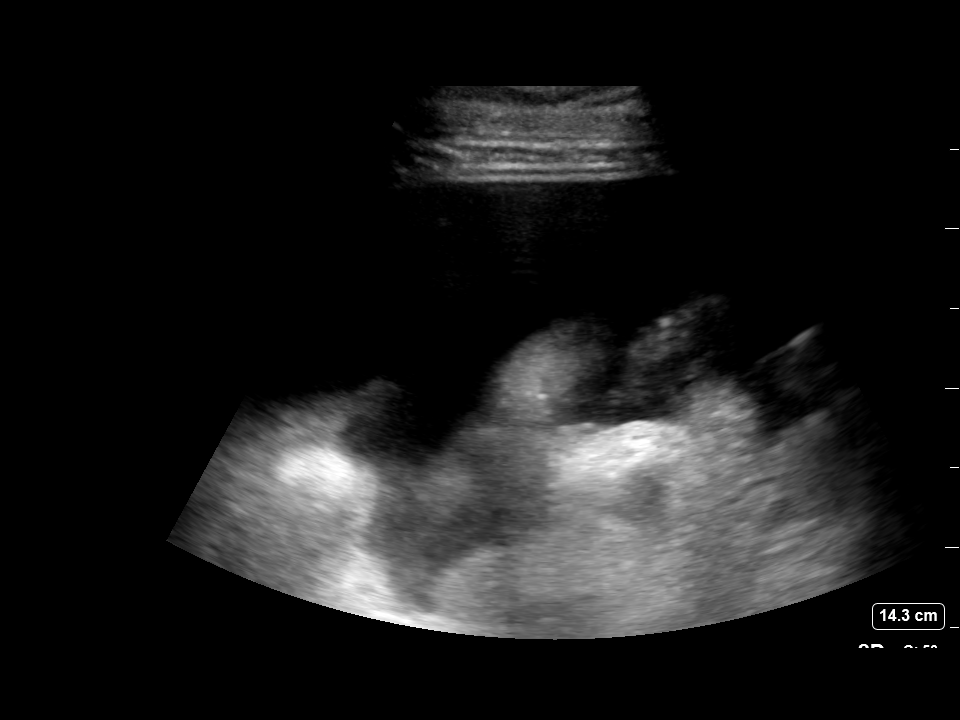

[5 of 5 positions shown; findings below may reference images not displayed]

EXAM:
ULTRASOUND GUIDED DIAGNOSTIC AND THERAPEUTIC PARACENTESIS

MEDICATIONS:
10 mL 1% lidocaine

COMPLICATIONS:
None immediate.

PROCEDURE:
Informed written consent was obtained from the patient after a
discussion of the risks, benefits and alternatives to treatment. A
timeout was performed prior to the initiation of the procedure.

Initial ultrasound scanning demonstrates a large amount of ascites
within the right lower abdominal quadrant. The right lower abdomen
was prepped and draped in the usual sterile fashion. 1% lidocaine
was used for local anesthesia.

Following this, a 6 Fr Safe-T-Centesis catheter was introduced. An
ultrasound image was saved for documentation purposes. The
paracentesis was performed. The catheter was removed and a dressing
was applied. The patient tolerated the procedure well without
immediate post procedural complication.
FINDINGS: A total of approximately 5.0 liters of chylous-appearing fluid was
removed. Samples were sent to the laboratory as requested by the
clinical team.
IMPRESSION: Successful ultrasound-guided diagnostic and therapeutic paracentesis
yielding 5.0 liters of peritoneal fluid.

## 2020-06-28 IMAGING — DX DG CHEST 1V
1 series · 1 of 1 positions shown · non-contrast
Comparison: 03/10/2018

CLINICAL DATA: Status post left thoracentesis.

EXAM:
CHEST  1 VIEW

[x chest ap]
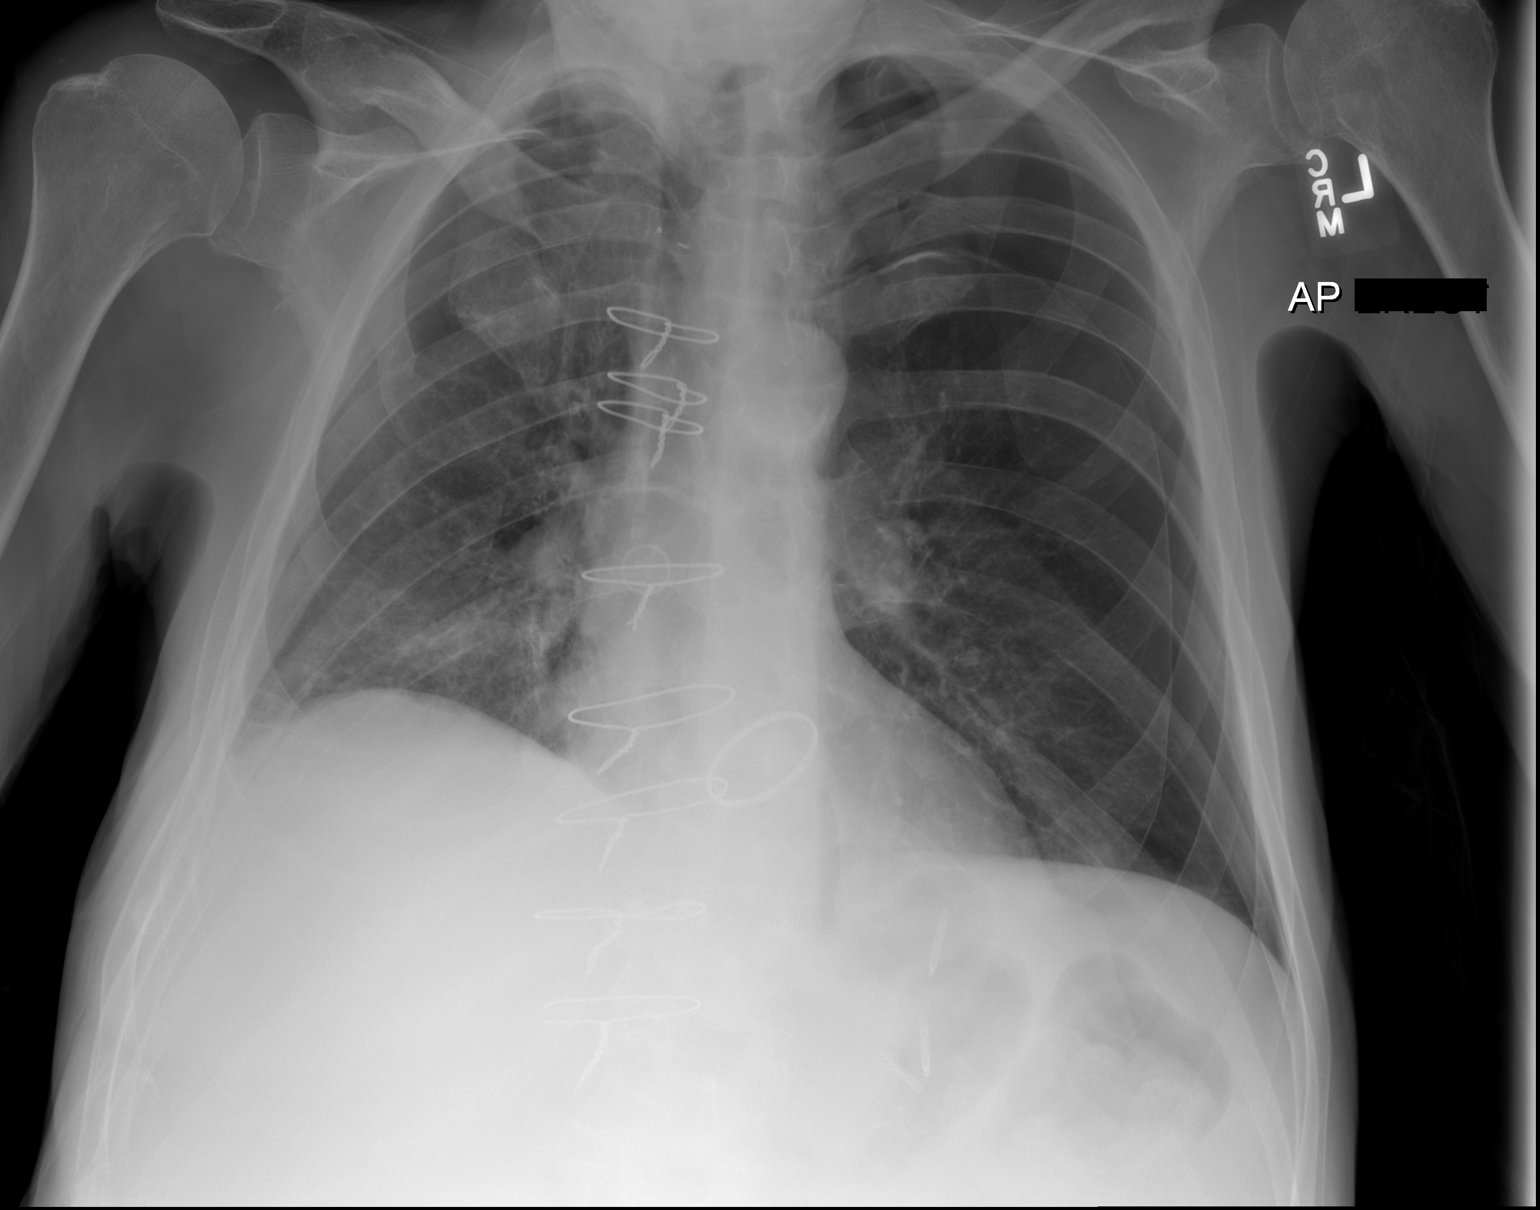

[1 of 1 positions shown; findings below may reference images not displayed]

FINDINGS: There is a left pneumothorax following thoracentesis. It is moderate
in size, estimated at 50%. There is no convincing midline shift,
allowing for the patient being rotated to the right.

Significant decrease in left pleural fluid following thoracentesis.
IMPRESSION: 1. Postprocedure left-sided pneumothorax estimated at 50%, which may
be ex vacuo given the large pleural effusion. Interventional
radiology made aware.
2.
